# Patient Record
Sex: Male | Born: 1955 | Race: White | Hispanic: No | Marital: Married | Smoking: Never smoker
Health system: Southern US, Community
[De-identification: ages and names within clinical notes are randomized; demographics above are authoritative.]

## PROBLEM LIST (undated history)

## (undated) DIAGNOSIS — I10 Essential (primary) hypertension: Secondary | ICD-10-CM

## (undated) DIAGNOSIS — T7840XA Allergy, unspecified, initial encounter: Secondary | ICD-10-CM

## (undated) DIAGNOSIS — E119 Type 2 diabetes mellitus without complications: Secondary | ICD-10-CM

## (undated) DIAGNOSIS — I639 Cerebral infarction, unspecified: Secondary | ICD-10-CM

## (undated) DIAGNOSIS — E785 Hyperlipidemia, unspecified: Secondary | ICD-10-CM

## (undated) DIAGNOSIS — I469 Cardiac arrest, cause unspecified: Secondary | ICD-10-CM

## (undated) HISTORY — PX: HERNIA REPAIR: SHX51

## (undated) HISTORY — DX: Hyperlipidemia, unspecified: E78.5

## (undated) HISTORY — DX: Allergy, unspecified, initial encounter: T78.40XA

---

## 2003-09-15 ENCOUNTER — Emergency Department (HOSPITAL_COMMUNITY): Admission: AD | Admit: 2003-09-15 | Discharge: 2003-09-15 | Payer: Self-pay | Admitting: Family Medicine

## 2005-01-09 ENCOUNTER — Ambulatory Visit: Payer: Self-pay | Admitting: Internal Medicine

## 2015-07-21 ENCOUNTER — Ambulatory Visit (INDEPENDENT_AMBULATORY_CARE_PROVIDER_SITE_OTHER): Payer: BLUE CROSS/BLUE SHIELD | Admitting: Family Medicine

## 2015-07-21 VITALS — BP 164/95 | HR 100 | Temp 98.2°F | Resp 16 | Ht 71.5 in | Wt 255.6 lb

## 2015-07-21 DIAGNOSIS — E669 Obesity, unspecified: Secondary | ICD-10-CM

## 2015-07-21 DIAGNOSIS — Z131 Encounter for screening for diabetes mellitus: Secondary | ICD-10-CM

## 2015-07-21 DIAGNOSIS — R7302 Impaired glucose tolerance (oral): Secondary | ICD-10-CM | POA: Diagnosis not present

## 2015-07-21 DIAGNOSIS — Z114 Encounter for screening for human immunodeficiency virus [HIV]: Secondary | ICD-10-CM | POA: Diagnosis not present

## 2015-07-21 DIAGNOSIS — Z1159 Encounter for screening for other viral diseases: Secondary | ICD-10-CM | POA: Diagnosis not present

## 2015-07-21 DIAGNOSIS — Z6835 Body mass index (BMI) 35.0-35.9, adult: Secondary | ICD-10-CM

## 2015-07-21 DIAGNOSIS — IMO0001 Reserved for inherently not codable concepts without codable children: Secondary | ICD-10-CM

## 2015-07-21 DIAGNOSIS — Z0000 Encounter for general adult medical examination without abnormal findings: Secondary | ICD-10-CM | POA: Diagnosis not present

## 2015-07-21 DIAGNOSIS — Z13220 Encounter for screening for lipoid disorders: Secondary | ICD-10-CM | POA: Diagnosis not present

## 2015-07-21 DIAGNOSIS — R030 Elevated blood-pressure reading, without diagnosis of hypertension: Secondary | ICD-10-CM | POA: Diagnosis not present

## 2015-07-21 LAB — COMPREHENSIVE METABOLIC PANEL
ALT: 13 U/L (ref 9–46)
AST: 21 U/L (ref 10–35)
Albumin: 4.1 g/dL (ref 3.6–5.1)
Alkaline Phosphatase: 41 U/L (ref 40–115)
BUN: 15 mg/dL (ref 7–25)
CO2: 28 mmol/L (ref 20–31)
Calcium: 8.9 mg/dL (ref 8.6–10.3)
Chloride: 104 mmol/L (ref 98–110)
Creat: 0.87 mg/dL (ref 0.70–1.33)
Glucose, Bld: 136 mg/dL — ABNORMAL HIGH (ref 65–99)
Potassium: 4.2 mmol/L (ref 3.5–5.3)
Sodium: 140 mmol/L (ref 135–146)
Total Bilirubin: 0.9 mg/dL (ref 0.2–1.2)
Total Protein: 6.8 g/dL (ref 6.1–8.1)

## 2015-07-21 LAB — CBC WITH DIFFERENTIAL/PLATELET
Basophils Absolute: 0 10*3/uL (ref 0.0–0.1)
Basophils Relative: 0 % (ref 0–1)
Eosinophils Absolute: 0.1 10*3/uL (ref 0.0–0.7)
Eosinophils Relative: 3 % (ref 0–5)
HCT: 44.1 % (ref 39.0–52.0)
Hemoglobin: 14.8 g/dL (ref 13.0–17.0)
Lymphocytes Relative: 37 % (ref 12–46)
Lymphs Abs: 1.8 10*3/uL (ref 0.7–4.0)
MCH: 30.5 pg (ref 26.0–34.0)
MCHC: 33.6 g/dL (ref 30.0–36.0)
MCV: 90.9 fL (ref 78.0–100.0)
MPV: 10.7 fL (ref 8.6–12.4)
Monocytes Absolute: 0.3 10*3/uL (ref 0.1–1.0)
Monocytes Relative: 7 % (ref 3–12)
Neutro Abs: 2.6 10*3/uL (ref 1.7–7.7)
Neutrophils Relative %: 53 % (ref 43–77)
Platelets: 148 10*3/uL — ABNORMAL LOW (ref 150–400)
RBC: 4.85 MIL/uL (ref 4.22–5.81)
RDW: 13.3 % (ref 11.5–15.5)
WBC: 4.9 10*3/uL (ref 4.0–10.5)

## 2015-07-21 LAB — POC MICROSCOPIC URINALYSIS (UMFC): Mucus: ABSENT

## 2015-07-21 LAB — POCT URINALYSIS DIP (MANUAL ENTRY)
Bilirubin, UA: NEGATIVE
Blood, UA: NEGATIVE
Glucose, UA: NEGATIVE
Ketones, POC UA: NEGATIVE
Leukocytes, UA: NEGATIVE
Nitrite, UA: NEGATIVE
Protein Ur, POC: NEGATIVE
Spec Grav, UA: 1.025
Urobilinogen, UA: 0.2
pH, UA: 5.5

## 2015-07-21 LAB — LIPID PANEL
Cholesterol: 178 mg/dL (ref 125–200)
HDL: 29 mg/dL — ABNORMAL LOW (ref >=40)
LDL Cholesterol: 118 mg/dL (ref <130)
Total CHOL/HDL Ratio: 6.1 Ratio — ABNORMAL HIGH (ref <=5.0)
Triglycerides: 154 mg/dL — ABNORMAL HIGH (ref <150)
VLDL: 31 mg/dL — ABNORMAL HIGH (ref <30)

## 2015-07-21 LAB — TSH: TSH: 1.46 u[IU]/mL (ref 0.350–4.500)

## 2015-07-21 LAB — HIV ANTIBODY (ROUTINE TESTING W REFLEX): HIV 1&2 Ab, 4th Generation: NONREACTIVE

## 2015-07-21 LAB — HEPATITIS C ANTIBODY: HCV Ab: NEGATIVE

## 2015-07-21 LAB — HEMOGLOBIN A1C: Hgb A1c MFr Bld: 6.1 % — AB (ref 4.0–6.0)

## 2015-07-21 LAB — GLUCOSE, POCT (MANUAL RESULT ENTRY): POC Glucose: 135 mg/dl — AB (ref 70–99)

## 2015-07-21 LAB — POCT GLYCOSYLATED HEMOGLOBIN (HGB A1C): Hemoglobin A1C: 6.1

## 2015-07-21 NOTE — Patient Instructions (Addendum)
1.  Check blood pressure once weekly and keep a record of your readings. 2.  PREDIABETES: WEIGHT LOSS, EXERCISE, LOW-SUGAR AND LOW-CARBOHYDRATE FOOD CHOICES. 3.  ELEVATED BLOOD PRESSURE: WEIGHT LOSS, EXERCISE, LOW-SALT FOOD CHOICES.  Keeping you healthy  Get these tests  Blood pressure- Have your blood pressure checked once a year by your healthcare provider.  Normal blood pressure is 120/80  Weight- Have your body mass index (BMI) calculated to screen for obesity.  BMI is a measure of body fat based on height and weight. You can also calculate your own BMI at [email or web address].com/bmi/.  Cholesterol- Have your cholesterol checked every year.  Diabetes- Have your blood sugar checked regularly if you have high blood pressure, high cholesterol, have a family history of diabetes or if you are overweight.  Screening for Colon Cancer- Colonoscopy starting at age 59.  Screening may begin sooner depending on your family history and other health conditions. Follow up colonoscopy as directed by your Gastroenterologist.  Screening for Prostate Cancer- Both blood work (PSA) and a rectal exam help screen for Prostate Cancer.  Screening begins at age 59 with African-American men and at age 59 with Caucasian men.  Screening may begin sooner depending on your family history.  Take these medicines  Aspirin- One aspirin daily can help prevent Heart disease and Stroke.  Flu shot- Every fall.  Tetanus- Every 10 years.  Zostavax- Once after the age of 59 to prevent Shingles.  Pneumonia shot- Once after the age of 59; if you are younger than 6065, ask your healthcare provider if you need a Pneumonia shot.  Take these steps  Don't smoke- If you do smoke, talk to your doctor about quitting.  For tips on how to quit, go to www.smokefree.gov or call 1-800-QUIT-NOW.  Be physically active- Exercise 5 days a week for at least 30 minutes.  If you are not already physically active start slow and gradually work up  to 30 minutes of moderate physical activity.  Examples of moderate activity include walking briskly, mowing the yard, dancing, swimming, bicycling, etc.  Eat a healthy diet- Eat a variety of healthy food such as fruits, vegetables, low fat milk, low fat cheese, yogurt, lean meant, poultry, fish, beans, tofu, etc. For more information go to www.thenutritionsource.org  Drink alcohol in moderation- Limit alcohol intake to less than two drinks a day. Never drink and drive.  Dentist- Brush and floss twice daily; visit your dentist twice a year.  Depression- Your emotional health is as important as your physical health. If you're feeling down, or losing interest in things you would normally enjoy please talk to your healthcare provider.  Eye exam- Visit your eye doctor every year.  Safe sex- If you may be exposed to a sexually transmitted infection, use a condom.  Seat belts- Seat belts can save your life; always wear one.  Smoke/Carbon Monoxide detectors- These detectors need to be installed on the appropriate level of your home.  Replace batteries at least once a year.  Skin cancer- When out in the sun, cover up and use sunscreen 15 SPF or higher.  Violence- If anyone is threatening you, please tell your healthcare provider.  Living Will/ Health care power of attorney- Speak with your healthcare provider and family.

## 2015-07-21 NOTE — Progress Notes (Signed)
Subjective:    Patient ID: Jason Melendez, male    DOB: 12-10-1955, 59 y.o.   MRN: [id number]  07/21/2015  Annual Exam   HPI This 59 y.o. male presents for Complete Physical Examination.  Last physical: nine years ago.  Colonoscopy:  2008; [place].  Arlyce Dice.  No polyps.  Repeat 10 years. TDAP:  Every 2 years with work.   2016. Pneumovax:  never Influenza:  06/01/2015 employment Eye exam:  +glasses; 1.5 years ago.   Dental exam:  Several years.   B: McDonald[employer] Breakfast burrito combo meal, orange juice small. 2 burritos and hashbrown. Lunch: spaghetti, chips, fruit or completes.  Water kiwi strawberry zero calorie. Supper:  Taco salads, breakfast for supper (omelet), diet water, diet Mt. Dew.   Snack: party cakes for halloween; little debbie.   Bedtime snack:  Cashews  Has lost 11 pounds in past week by cutting out nighttime snacks.  First three days was horrible.  Last diet 8 years ago and lost 45 pounds; felt great.     Review of Systems  Constitutional: Negative for fever, chills, diaphoresis, activity change, appetite change, fatigue and unexpected weight change.  HENT: Negative for congestion, dental problem, drooling, ear discharge, ear pain, facial swelling, hearing loss, mouth sores, nosebleeds, postnasal drip, rhinorrhea, sinus pressure, sneezing, sore throat, tinnitus, trouble swallowing and voice change.   Eyes: Negative for photophobia, pain, discharge, redness, itching and visual disturbance.  Respiratory: Negative for apnea, cough, choking, chest tightness, shortness of breath, wheezing and stridor.   Cardiovascular: Negative for chest pain, palpitations and leg swelling.  Gastrointestinal: Negative for nausea, vomiting, abdominal pain, diarrhea, constipation and blood in stool.  Endocrine: Negative for cold intolerance, heat intolerance, polydipsia, polyphagia and polyuria.  Genitourinary: Negative for dysuria, urgency, frequency, hematuria, flank pain,  decreased urine volume, discharge, penile swelling, scrotal swelling, enuresis, difficulty urinating, genital sores, penile pain and testicular pain.       Nocturia x 1.  Urine flow is normal.  No ED; sex drive normal.  Musculoskeletal: Negative for myalgias, back pain, joint swelling, arthralgias, gait problem, neck pain and neck stiffness.  Skin: Negative for color change, pallor, rash and wound.  Allergic/Immunologic: Negative for environmental allergies, food allergies and immunocompromised state.  Neurological: Negative for dizziness, tremors, seizures, syncope, facial asymmetry, speech difficulty, weakness, light-headedness, numbness and headaches.  Hematological: Negative for adenopathy. Does not bruise/bleed easily.  Psychiatric/Behavioral: Negative for suicidal ideas, hallucinations, behavioral problems, confusion, sleep disturbance, self-injury, dysphoric mood, decreased concentration and agitation. The patient is not nervous/anxious and is not hyperactive.     History reviewed. No pertinent past medical history. Past Surgical History  Procedure Laterality Date  . Hernia repair     No Known Allergies Current Outpatient Prescriptions  Medication Sig Dispense Refill  . naproxen sodium (ANAPROX) 220 MG tablet Take 220 mg by mouth 2 (two) times daily with a meal.     No current facility-administered medications for this visit.   Social History   Social History  . Marital Status: Married    Spouse Name: N/A  . Number of Children: N/A  . Years of Education: N/A   Occupational History  . Not on file.   Social History Main Topics  . Smoking status: Never Smoker   . Smokeless tobacco: Not on file  . Alcohol Use: No  . Drug Use: No  . Sexual Activity: Not Currently   Other Topics Concern  . Not on file   Social History Narrative   Marital  status: married      Children: 2 daughters      Lives:  With wife      Employment: printing work      Tobacco: none      Alcohol:  one beer every three months      Drugs:  None      Exercise:  No formal exercise; walks up three flights of stairs several times per day at work.   Family History  Problem Relation Age of Onset  . Diabetes Mother   . Heart disease Father 2260    CABG  . Diabetes Father   . Kidney disease Father   . Kidney failure Father   . Hypertension Sister        Objective:    BP 164/95 mmHg  Pulse 100  Temp(Src) 98.2 F (36.8 C) (Oral)  Resp 16  Ht 5' 11.5" (1.816 m)  Wt 255 lb 9.6 oz (115.939 kg)  BMI 35.16 kg/m2  SpO2 98% Physical Exam  Constitutional: He is oriented to person, place, and time. He appears well-developed and well-nourished. No distress.  obese  HENT:  Head: Normocephalic and atraumatic.  Right Ear: External ear normal.  Left Ear: External ear normal.  Nose: Nose normal.  Mouth/Throat: Oropharynx is clear and moist.  Eyes: Conjunctivae and EOM are normal. Pupils are equal, round, and reactive to light.  Neck: Normal range of motion. Neck supple. Carotid bruit is not present. No thyromegaly present.  Cardiovascular: Normal rate, regular rhythm, normal heart sounds and intact distal pulses.  Exam reveals no gallop and no friction rub.   No murmur heard. Pulmonary/Chest: Effort normal and breath sounds normal. He has no wheezes. He has no rales.  Abdominal: Soft. Bowel sounds are normal. He exhibits no distension and no mass. There is no tenderness. There is no rebound and no guarding. Hernia confirmed negative in the right inguinal area and confirmed negative in the left inguinal area.  Genitourinary: Testes normal and penis normal. Right testis shows no mass, no swelling and no tenderness. Left testis shows no mass, no swelling and no tenderness. Circumcised.  Musculoskeletal:       Right shoulder: Normal.       Left shoulder: Normal.       Cervical back: Normal.  Lymphadenopathy:    He has no cervical adenopathy.  Neurological: He is alert and oriented to person,  place, and time. He has normal reflexes. No cranial nerve deficit. He exhibits normal muscle tone. Coordination normal.  Skin: Skin is warm and dry. No rash noted. He is not diaphoretic.  Dystrophic nails B feet.  Psychiatric: He has a normal mood and affect. His behavior is normal. Judgment and thought content normal.   Results for orders placed or performed in visit on 07/21/15  POCT urinalysis dipstick  Result Value Ref Range   Color, UA yellow yellow   Clarity, UA clear clear   Glucose, UA negative negative   Bilirubin, UA negative negative   Ketones, POC UA negative negative   Spec Grav, UA 1.025    Blood, UA negative negative   pH, UA 5.5    Protein Ur, POC negative negative   Urobilinogen, UA 0.2    Nitrite, UA Negative Negative   Leukocytes, UA Negative Negative  POCT Microscopic Urinalysis ([facility])  Result Value Ref Range   WBC,UR,HPF,POC Few (A) None WBC/hpf   RBC,UR,HPF,POC None None RBC/hpf   Bacteria Few (A) None, Too numerous to count   Mucus Absent  Absent   Epithelial Cells, UR Per Microscopy Few (A) None, Too numerous to count cells/hpf  POCT glucose (manual entry)  Result Value Ref Range   POC Glucose 135 (A) 70 - 99 mg/dl  POCT glycosylated hemoglobin (Hb A1C)  Result Value Ref Range   Hemoglobin A1C 6.1     EKG: WNL.    Assessment & Plan:   1. Routine physical examination   2. Elevated blood pressure   3. Screening, lipid   4. Screening for diabetes mellitus   5. Screening for HIV (human immunodeficiency virus)   6. Need for hepatitis C screening test   7. Obesity   8. BMI 35.0-35.9,adult    1. Complete Physical Examination: anticipatory guidance --- exercise, weight loss, ASA  daily.  Immunizations UTD.   2.  Screening lipid: obtain labs. 3. Screening DMII: obtain labs. 4.  Screening HIV; obtain labs. 5. Screening Hepatitis C: obtain labs. 6. Elevated blood pressure: New.  Obtain labs, EKG. Patient desires three month trial of weight loss,  exercise, low-sodium food choices. 7.Obesity: recommend weight loss, exercise, low-calorie food choices. 8.  Glucose intolerance: New.  Recommend weight loss, exercise, low-carbohydrate food choices; RTC three months.   Orders Placed This Encounter  Procedures  . CBC with Differential/Platelet  . Comprehensive metabolic panel    Order Specific Question:  Has the patient fasted?    Answer:  Yes  . Lipid panel    Order Specific Question:  Has the patient fasted?    Answer:  Yes  . TSH  . Hepatitis C antibody  . HIV antibody  . POCT urinalysis dipstick  . POCT Microscopic Urinalysis ([facility])  . POCT glucose (manual entry)  . POCT glycosylated hemoglobin (Hb A1C)  . EKG 12-Lead   Meds ordered this encounter  Medications  . naproxen sodium (ANAPROX) 220 MG tablet    Sig: Take 220 mg by mouth 2 (two) times daily with a meal.    No Follow-up on file.    Jizel Cheeks Paulita Fujita, M.D. [facility] [address] [place], [place]  [phone] phone [phone] fax

## 2015-07-21 NOTE — Addendum Note (Signed)
Addended by: Honor LohGARRISON, CARRIE M on: 07/21/2015 02:19 PM   Modules accepted: Kipp BroodSmartSet

## 2015-07-24 ENCOUNTER — Encounter: Payer: Self-pay | Admitting: Family Medicine

## 2015-07-31 ENCOUNTER — Encounter: Payer: Self-pay | Admitting: Family Medicine

## 2015-08-08 ENCOUNTER — Telehealth: Payer: Self-pay

## 2015-08-08 NOTE — Telephone Encounter (Signed)
[facility] needs the well-ness report completed on November 20.  See Encounter.  ASAP  Needed this last week.    [phone]

## 2015-08-08 NOTE — Telephone Encounter (Signed)
I was unable to locate a wellness form for this patient. I did not see it scanned into media, Dr. Michaelle CopasSmith's box or any of the scan boxes. Not sure if form was dropped off or completed during office visit.

## 2015-08-13 NOTE — Telephone Encounter (Signed)
Left message on machine for patient. I did not see a copy of wellness form. If he still needs it completed then we can get another copy filled out. If it's already taken care of then disregard voicemail.

## 2015-08-14 NOTE — Telephone Encounter (Signed)
The form has been placed in medical records along with the phone messages attached. Please call patient when this is done.  [phone]

## 2015-08-14 NOTE — Telephone Encounter (Addendum)
Patient returned phone call and was quite ugly with me. He will send over another copy this form and is really hoping that it will be filled out today. Please fax this form to the following numbers: [facility] at [phone] and to the patient at [phone]

## 2015-08-14 NOTE — Telephone Encounter (Signed)
Dr. Katrinka BlazingSmith, can you complete wellness form for this patient? He had a CPE with you on 07/21/15. He is very upset about form not being sent to [facility] but I was unable to locate original form. I have placed a blank copy in your box for completion. I can fax form once completed. Thank you!

## 2015-08-15 NOTE — Telephone Encounter (Signed)
Can another provider complete form? Patient needs ASAP? Will place in 102 nurse station box.

## 2015-08-15 NOTE — Telephone Encounter (Signed)
Form completed this afternoon by Lanier ClamNicole Bush, PA-C. Form has been [facility] to [facility] at [phone] with confirmation and also to patient at [phone], also with confirmation. Left voicemail for patient. Form placed in scan box.

## 2015-08-15 NOTE — Telephone Encounter (Signed)
Patient called back stating he received form that was faxed to him but the form with not be accepted by [facility] because the wait circumference is missing on the form. The form has "n/a". I did not see this documented in the chart but patient says it was measured during his visit on 07/21/15. Will forward form to Dr. Katrinka BlazingSmith for review.

## 2015-08-15 NOTE — Telephone Encounter (Signed)
Patient called again today checking status of form. Jason Melendez is giving him a hard time about his insurance because they don't have the wellness form completed. Form was still in Dr. Michaelle CopasSmith's box. I will see if another provider can complete form based off her office notes from CPE.

## 2015-08-15 NOTE — Telephone Encounter (Signed)
I am happy to complete form today upon arrival to clinic; message not routed to me until 08/14/15?

## 2015-08-16 NOTE — Telephone Encounter (Signed)
The patient is continuing to harass me about this form. Dr. Katrinka BlazingSmith is aware of this. Can clinical PLEASE call this patient and get more information. He will not leave me alone. I am simply the phone operator. I cannot do anything with this form.

## 2015-08-16 NOTE — Telephone Encounter (Signed)
If the form is not correct, he needs to speak with clinical. Medical records does not make changes to patient paperwork. I have placed the form in Dr. Michaelle CopasSmith's box yesterday.

## 2015-08-16 NOTE — Telephone Encounter (Signed)
Spoke with patient --- needs waist circumference for form completion.  Measurement of 44 inches for waist circumference.  Wears a 40 inch waist size.   Form revised and will refax form over with waist circumference included.

## 2015-08-16 NOTE — Telephone Encounter (Signed)
Please call this patient. He states that this form is still incorrect.

## 2015-08-20 ENCOUNTER — Telehealth: Payer: Self-pay | Admitting: Family Medicine

## 2015-08-20 NOTE — Telephone Encounter (Signed)
Not sure if form is in scan pile. I haven't had a chance to look yet.

## 2015-08-20 NOTE — Telephone Encounter (Signed)
Patient wants a copy of his wellness form faxed to him at [phone]. Please see previous phone exchanges about this form.

## 2015-08-20 NOTE — Telephone Encounter (Signed)
Is this form in the scan pile?

## 2015-08-21 NOTE — Telephone Encounter (Signed)
Still looking for form. Did not see it in scan box today.

## 2015-08-23 NOTE — Telephone Encounter (Signed)
Still unable to locate form. Medical records has sent a lot of documents to the scan center this week. Form may have been sent for scanning. Nothing yet scanned into media as of today.

## 2015-10-25 ENCOUNTER — Ambulatory Visit: Payer: BLUE CROSS/BLUE SHIELD | Admitting: Family Medicine

## 2015-10-29 ENCOUNTER — Ambulatory Visit (INDEPENDENT_AMBULATORY_CARE_PROVIDER_SITE_OTHER): Payer: 59 | Admitting: Family Medicine

## 2015-10-29 ENCOUNTER — Encounter: Payer: Self-pay | Admitting: Family Medicine

## 2015-10-29 VITALS — BP 128/77 | HR 96 | Temp 99.2°F | Resp 16 | Ht 71.5 in | Wt 242.0 lb

## 2015-10-29 DIAGNOSIS — Z6833 Body mass index (BMI) 33.0-33.9, adult: Secondary | ICD-10-CM

## 2015-10-29 DIAGNOSIS — D696 Thrombocytopenia, unspecified: Secondary | ICD-10-CM

## 2015-10-29 DIAGNOSIS — R7302 Impaired glucose tolerance (oral): Secondary | ICD-10-CM | POA: Diagnosis not present

## 2015-10-29 DIAGNOSIS — R030 Elevated blood-pressure reading, without diagnosis of hypertension: Secondary | ICD-10-CM | POA: Diagnosis not present

## 2015-10-29 DIAGNOSIS — E7800 Pure hypercholesterolemia, unspecified: Secondary | ICD-10-CM

## 2015-10-29 DIAGNOSIS — IMO0001 Reserved for inherently not codable concepts without codable children: Secondary | ICD-10-CM

## 2015-10-29 DIAGNOSIS — E669 Obesity, unspecified: Secondary | ICD-10-CM | POA: Diagnosis not present

## 2015-10-29 LAB — POCT GLYCOSYLATED HEMOGLOBIN (HGB A1C): Hemoglobin A1C: 6.3

## 2015-10-29 NOTE — Progress Notes (Signed)
Subjective:    Patient ID: Jason Melendez, male    DOB: 02/21/1956, 60 y.o.   MRN: [id number]  10/29/2015  Follow-up   HPI This 60 y.o. male presents for three month follow-up:   1. HTN:  Down 13 pounds in past three months.  BP running depends on the time of the day.  Early morning, blood pressures are better; later in the day, blood pressure worsens.  Heart rate follows BP.  Joined [employer]; hit hard for two weeks and then daughter noticed that patient was going to the gym.  BP was high; [employer] dictates what patient does; there were 29 straight days with 17 hours per day.  Good money and overtime paid for Christmas.  Had just started in 05/2015.  March 11 is pre-market and the fourth week of May market will slow down again. Hard to eat right.    2. Glucose Intolerance:  Weight down 13 pounds.  3.  Hyperlipidemia:  Elevated; LDL 118.    Weight down 21 pounds.  Daughter with eating disorder; daughter does all the cooking.  Unable to discuss need for weight loss with daughter; also unable to restrict with daughter; daughter has been admitted for five weeks.  [facility]; over-achiever.  Collapsed while writing thesis.  Dance team member made comment.    Review of Systems  Constitutional: Negative for fever, chills, diaphoresis, activity change, appetite change and fatigue.  Respiratory: Negative for cough and shortness of breath.   Cardiovascular: Negative for chest pain, palpitations and leg swelling.  Gastrointestinal: Negative for nausea, vomiting, abdominal pain and diarrhea.  Endocrine: Negative for cold intolerance, heat intolerance, polydipsia, polyphagia and polyuria.  Skin: Negative for color change, rash and wound.  Neurological: Negative for dizziness, tremors, seizures, syncope, facial asymmetry, speech difficulty, weakness, light-headedness, numbness and headaches.  Psychiatric/Behavioral: Negative for sleep disturbance and dysphoric mood. The  patient is not nervous/anxious.     No past medical history on file. Past Surgical History  Procedure Laterality Date  . Hernia repair     No Known Allergies Current Outpatient Prescriptions  Medication Sig Dispense Refill  . aspirin 81 MG tablet Take 81 mg by mouth daily.    . naproxen sodium (ANAPROX) 220 MG tablet Take 220 mg by mouth 2 (two) times daily with a meal.    . OVER THE COUNTER MEDICATION     . atorvastatin (LIPITOR) 10 MG tablet Take 1 tablet (10 mg total) by mouth daily at 6 PM. 90 tablet 1   No current facility-administered medications for this visit.   Social History   Social History  . Marital Status: Married    Spouse Name: N/A  . Number of Children: N/A  . Years of Education: N/A   Occupational History  . Not on file.   Social History Main Topics  . Smoking status: Never Smoker   . Smokeless tobacco: Not on file  . Alcohol Use: No  . Drug Use: No  . Sexual Activity: Not Currently   Other Topics Concern  . Not on file   Social History Narrative   Marital status: married      Children: 2 daughters      Lives:  With wife      Employment: printing work      Tobacco: none      Alcohol: one beer every three months      Drugs:  None      Exercise:  No formal exercise;  walks up three flights of stairs several times per day at work.   Family History  Problem Relation Age of Onset  . Diabetes Mother   . Heart disease Father 81    CABG  . Diabetes Father   . Kidney disease Father   . Kidney failure Father   . Hypertension Sister        Objective:    BP 128/77 mmHg  Pulse 96  Temp(Src) 99.2 F (37.3 C) (Oral)  Resp 16  Ht 5' 11.5" (1.816 m)  Wt 242 lb (109.77 kg)  BMI 33.29 kg/m2  SpO2 98% Physical Exam  Constitutional: He is oriented to person, place, and time. He appears well-developed and well-nourished. No distress.  HENT:  Head: Normocephalic and atraumatic.  Right Ear: External ear normal.  Left Ear: External ear normal.    Nose: Nose normal.  Mouth/Throat: Oropharynx is clear and moist.  Eyes: Conjunctivae and EOM are normal. Pupils are equal, round, and reactive to light.  Neck: Normal range of motion. Neck supple. Carotid bruit is not present. No thyromegaly present.  Cardiovascular: Normal rate, regular rhythm, normal heart sounds and intact distal pulses.  Exam reveals no gallop and no friction rub.   No murmur heard. Pulmonary/Chest: Effort normal and breath sounds normal. He has no wheezes. He has no rales.  Abdominal: Soft. Bowel sounds are normal. He exhibits no distension and no mass. There is no tenderness. There is no rebound and no guarding.  Lymphadenopathy:    He has no cervical adenopathy.  Neurological: He is alert and oriented to person, place, and time. No cranial nerve deficit.  Skin: Skin is warm and dry. No rash noted. He is not diaphoretic.  Psychiatric: He has a normal mood and affect. His behavior is normal.  Nursing note and vitals reviewed.       Assessment & Plan:   1. Blood pressure elevated   2. Glucose intolerance (impaired glucose tolerance)   3. Pure hypercholesterolemia   4. Thrombocytopenia (HCC)   5. Obesity   6. BMI 33.0-33.9,adult     Orders Placed This Encounter  Procedures  . CBC with Differential/Platelet  . Comprehensive metabolic panel    Order Specific Question:  Has the patient fasted?    Answer:  Yes  . Lipid panel    Order Specific Question:  Has the patient fasted?    Answer:  Yes  . POCT glycosylated hemoglobin (Hb A1C)   Meds ordered this encounter  Medications  . aspirin 81 MG tablet    Sig: Take 81 mg by mouth daily.  Marland Kitchen OVER THE COUNTER MEDICATION    Sig:   . atorvastatin (LIPITOR) 10 MG tablet    Sig: Take 1 tablet (10 mg total) by mouth daily at 6 PM.    Dispense:  90 tablet    Refill:  1    Return in about 9 months (around 07/28/2016) for complete physical examiniation.    Welford Christmas Paulita Fujita, M.D. [facility] [address] [place], [place]  [phone] phone [phone] fax

## 2015-10-30 LAB — COMPREHENSIVE METABOLIC PANEL
ALT: 8 U/L — ABNORMAL LOW (ref 9–46)
AST: 15 U/L (ref 10–35)
Albumin: 4.1 g/dL (ref 3.6–5.1)
Alkaline Phosphatase: 44 U/L (ref 40–115)
BUN: 11 mg/dL (ref 7–25)
CO2: 28 mmol/L (ref 20–31)
Calcium: 9.5 mg/dL (ref 8.6–10.3)
Chloride: 107 mmol/L (ref 98–110)
Creat: 0.83 mg/dL (ref 0.70–1.33)
Glucose, Bld: 84 mg/dL (ref 65–99)
Potassium: 4.8 mmol/L (ref 3.5–5.3)
Sodium: 144 mmol/L (ref 135–146)
Total Bilirubin: 0.6 mg/dL (ref 0.2–1.2)
Total Protein: 6.9 g/dL (ref 6.1–8.1)

## 2015-10-30 LAB — CBC WITH DIFFERENTIAL/PLATELET
Basophils Absolute: 0 10*3/uL (ref 0.0–0.1)
Basophils Relative: 0 % (ref 0–1)
Eosinophils Absolute: 0.4 10*3/uL (ref 0.0–0.7)
Eosinophils Relative: 5 % (ref 0–5)
HCT: 42.1 % (ref 39.0–52.0)
Hemoglobin: 14 g/dL (ref 13.0–17.0)
Lymphocytes Relative: 41 % (ref 12–46)
Lymphs Abs: 2.9 10*3/uL (ref 0.7–4.0)
MCH: 29.4 pg (ref 26.0–34.0)
MCHC: 33.3 g/dL (ref 30.0–36.0)
MCV: 88.4 fL (ref 78.0–100.0)
MPV: 10.9 fL (ref 8.6–12.4)
Monocytes Absolute: 0.6 10*3/uL (ref 0.1–1.0)
Monocytes Relative: 8 % (ref 3–12)
Neutro Abs: 3.2 10*3/uL (ref 1.7–7.7)
Neutrophils Relative %: 46 % (ref 43–77)
Platelets: 226 10*3/uL (ref 150–400)
RBC: 4.76 MIL/uL (ref 4.22–5.81)
RDW: 13.1 % (ref 11.5–15.5)
WBC: 7 10*3/uL (ref 4.0–10.5)

## 2015-10-30 LAB — LIPID PANEL
Cholesterol: 172 mg/dL (ref 125–200)
HDL: 24 mg/dL — ABNORMAL LOW (ref >=40)
LDL Cholesterol: 98 mg/dL (ref <130)
Total CHOL/HDL Ratio: 7.2 Ratio — ABNORMAL HIGH (ref <=5.0)
Triglycerides: 251 mg/dL — ABNORMAL HIGH (ref <150)
VLDL: 50 mg/dL — ABNORMAL HIGH (ref <30)

## 2015-11-14 MED ORDER — ATORVASTATIN CALCIUM 10 MG PO TABS: 10.0000 mg | ORAL_TABLET | Freq: Every day | ORAL | Status: DC

## 2016-10-12 ENCOUNTER — Encounter (HOSPITAL_COMMUNITY): Payer: Self-pay | Admitting: Emergency Medicine

## 2016-10-12 ENCOUNTER — Inpatient Hospital Stay (HOSPITAL_COMMUNITY)
Admission: EM | Admit: 2016-10-12 | Discharge: 2016-10-18 | DRG: 038 | Disposition: A | Discharge status: Home / Self Care | Payer: 59 | Attending: Internal Medicine | Admitting: Internal Medicine

## 2016-10-12 ENCOUNTER — Ambulatory Visit (INDEPENDENT_AMBULATORY_CARE_PROVIDER_SITE_OTHER): Payer: 59 | Admitting: Family Medicine

## 2016-10-12 ENCOUNTER — Emergency Department (HOSPITAL_COMMUNITY): Payer: 59

## 2016-10-12 VITALS — BP 219/138 | HR 106 | Resp 18 | Wt 270.0 lb

## 2016-10-12 DIAGNOSIS — E7800 Pure hypercholesterolemia, unspecified: Secondary | ICD-10-CM | POA: Diagnosis present

## 2016-10-12 DIAGNOSIS — I509 Heart failure, unspecified: Secondary | ICD-10-CM

## 2016-10-12 DIAGNOSIS — I5022 Chronic systolic (congestive) heart failure: Secondary | ICD-10-CM | POA: Diagnosis present

## 2016-10-12 DIAGNOSIS — Z6836 Body mass index (BMI) 36.0-36.9, adult: Secondary | ICD-10-CM

## 2016-10-12 DIAGNOSIS — I493 Ventricular premature depolarization: Secondary | ICD-10-CM | POA: Diagnosis not present

## 2016-10-12 DIAGNOSIS — E66811 Obesity, class 1: Secondary | ICD-10-CM

## 2016-10-12 DIAGNOSIS — R4701 Aphasia: Secondary | ICD-10-CM | POA: Diagnosis present

## 2016-10-12 DIAGNOSIS — I63522 Cerebral infarction due to unspecified occlusion or stenosis of left anterior cerebral artery: Secondary | ICD-10-CM | POA: Diagnosis not present

## 2016-10-12 DIAGNOSIS — I429 Cardiomyopathy, unspecified: Secondary | ICD-10-CM

## 2016-10-12 DIAGNOSIS — R4789 Other speech disturbances: Secondary | ICD-10-CM | POA: Diagnosis not present

## 2016-10-12 DIAGNOSIS — G8191 Hemiplegia, unspecified affecting right dominant side: Secondary | ICD-10-CM | POA: Diagnosis present

## 2016-10-12 DIAGNOSIS — I110 Hypertensive heart disease with heart failure: Secondary | ICD-10-CM | POA: Diagnosis present

## 2016-10-12 DIAGNOSIS — I6523 Occlusion and stenosis of bilateral carotid arteries: Secondary | ICD-10-CM | POA: Diagnosis present

## 2016-10-12 DIAGNOSIS — I161 Hypertensive emergency: Secondary | ICD-10-CM | POA: Diagnosis not present

## 2016-10-12 DIAGNOSIS — R531 Weakness: Secondary | ICD-10-CM | POA: Diagnosis not present

## 2016-10-12 DIAGNOSIS — R29898 Other symptoms and signs involving the musculoskeletal system: Secondary | ICD-10-CM | POA: Diagnosis not present

## 2016-10-12 DIAGNOSIS — E785 Hyperlipidemia, unspecified: Secondary | ICD-10-CM | POA: Diagnosis present

## 2016-10-12 DIAGNOSIS — E669 Obesity, unspecified: Secondary | ICD-10-CM

## 2016-10-12 DIAGNOSIS — I639 Cerebral infarction, unspecified: Secondary | ICD-10-CM

## 2016-10-12 DIAGNOSIS — I6522 Occlusion and stenosis of left carotid artery: Secondary | ICD-10-CM

## 2016-10-12 DIAGNOSIS — I420 Dilated cardiomyopathy: Secondary | ICD-10-CM

## 2016-10-12 DIAGNOSIS — Z79899 Other long term (current) drug therapy: Secondary | ICD-10-CM

## 2016-10-12 DIAGNOSIS — Z8673 Personal history of transient ischemic attack (TIA), and cerebral infarction without residual deficits: Secondary | ICD-10-CM | POA: Diagnosis present

## 2016-10-12 HISTORY — DX: Essential (primary) hypertension: I10

## 2016-10-12 LAB — CBC WITH DIFFERENTIAL/PLATELET
Basophils Absolute: 0 10*3/uL (ref 0.0–0.1)
Basophils Relative: 0 %
Eosinophils Absolute: 0.1 10*3/uL (ref 0.0–0.7)
Eosinophils Relative: 2 %
HCT: 44.5 % (ref 39.0–52.0)
Hemoglobin: 14.8 g/dL (ref 13.0–17.0)
Lymphocytes Relative: 37 %
Lymphs Abs: 2.4 10*3/uL (ref 0.7–4.0)
MCH: 30.3 pg (ref 26.0–34.0)
MCHC: 33.3 g/dL (ref 30.0–36.0)
MCV: 91.2 fL (ref 78.0–100.0)
Monocytes Absolute: 0.4 10*3/uL (ref 0.1–1.0)
Monocytes Relative: 6 %
Neutro Abs: 3.4 10*3/uL (ref 1.7–7.7)
Neutrophils Relative %: 55 %
Platelets: 145 10*3/uL — ABNORMAL LOW (ref 150–400)
RBC: 4.88 MIL/uL (ref 4.22–5.81)
RDW: 13.1 % (ref 11.5–15.5)
WBC: 6.4 10*3/uL (ref 4.0–10.5)

## 2016-10-12 LAB — BASIC METABOLIC PANEL
Anion gap: 10 (ref 5–15)
BUN: 12 mg/dL (ref 6–20)
CO2: 25 mmol/L (ref 22–32)
Calcium: 9.5 mg/dL (ref 8.9–10.3)
Chloride: 106 mmol/L (ref 101–111)
Creatinine, Ser: 1.01 mg/dL (ref 0.61–1.24)
GFR calc Af Amer: >60 mL/min (ref >60)
GFR calc non Af Amer: >60 mL/min (ref >60)
Glucose, Bld: 117 mg/dL — ABNORMAL HIGH (ref 65–99)
Potassium: 3.9 mmol/L (ref 3.5–5.1)
Sodium: 141 mmol/L (ref 135–145)

## 2016-10-12 LAB — HEPATIC FUNCTION PANEL
ALT: 18 U/L (ref 17–63)
AST: 34 U/L (ref 15–41)
Albumin: 3.5 g/dL (ref 3.5–5.0)
Alkaline Phosphatase: 34 U/L — ABNORMAL LOW (ref 38–126)
Bilirubin, Direct: 0.2 mg/dL (ref 0.1–0.5)
Indirect Bilirubin: 0.7 mg/dL (ref 0.3–0.9)
Total Bilirubin: 0.9 mg/dL (ref 0.3–1.2)
Total Protein: 6 g/dL — ABNORMAL LOW (ref 6.5–8.1)

## 2016-10-12 LAB — BRAIN NATRIURETIC PEPTIDE: B Natriuretic Peptide: 27 pg/mL (ref 0.0–100.0)

## 2016-10-12 LAB — I-STAT TROPONIN, ED: Troponin i, poc: 0.02 ng/mL (ref 0.00–0.08)

## 2016-10-12 LAB — APTT: aPTT: 32 seconds (ref 24–36)

## 2016-10-12 LAB — PROTIME-INR
INR: 1.05
Prothrombin Time: 13.7 seconds (ref 11.4–15.2)

## 2016-10-12 LAB — D-DIMER, QUANTITATIVE: D-Dimer, Quant: 0.89 ug/mL-FEU — ABNORMAL HIGH (ref 0.00–0.50)

## 2016-10-12 MED ORDER — SODIUM CHLORIDE 0.9 % IV BOLUS (SEPSIS)
1000.0000 mL | Freq: Once | INTRAVENOUS | Status: AC
  Administered 2016-10-12: 1000 mL via INTRAVENOUS

## 2016-10-12 MED ORDER — IOPAMIDOL (ISOVUE-370) INJECTION 76%
INTRAVENOUS | Status: AC
  Filled 2016-10-12: qty 100

## 2016-10-12 MED ORDER — ATROPINE SULFATE 1 MG/10ML IJ SOSY
1.0000 mg | PREFILLED_SYRINGE | Freq: Once | INTRAMUSCULAR | Status: AC
Start: 2016-10-12 — End: 2016-10-12
  Administered 2016-10-12: 1 mg via INTRAVENOUS

## 2016-10-12 MED ORDER — HYDRALAZINE HCL 20 MG/ML IJ SOLN
5.0000 mg | Freq: Once | INTRAMUSCULAR | Status: AC
  Administered 2016-10-12: 5 mg via INTRAVENOUS
  Filled 2016-10-12: qty 1

## 2016-10-12 MED ORDER — SODIUM CHLORIDE 0.9 % IV SOLN
INTRAVENOUS | Status: DC
  Administered 2016-10-12: 21:00:00 via INTRAVENOUS

## 2016-10-12 NOTE — ED Notes (Signed)
pts weakness and aphasia has subsided, edp and this rn remains at bedside

## 2016-10-12 NOTE — ED Notes (Signed)
Neurologist and rapid response now at bedside

## 2016-10-12 NOTE — ED Notes (Signed)
Pt denies H/A, lightheadedness, dizziness or blurred vision

## 2016-10-12 NOTE — ED Provider Notes (Signed)
Medical screening examination/treatment/procedure(s) were conducted as a shared visit with non-physician practitioner(s) and myself.  I personally evaluated the patient during the encounter.   EKG Interpretation  Date/Time:  Monday October 12 2016 20:49:34 EST Ventricular Rate:  92 PR Interval:    QRS Duration: 92 QT Interval:  349 QTC Calculation: 432 R Axis:   45 Text Interpretation:  Sinus rhythm Probable left atrial enlargement Borderline T wave abnormalities Confirmed by Freida BusmanALLEN  MD, Riggin Cuttino ([id number]) on 10/12/2016 10:24:1137 PM     61 year old male who came here complaining of right sided weakness since 2 days. Patient also hypertensive. He also had some right-sided chest pain with elevated d-dimer. He was to have a CT of his chest as well as a brain MRI after his chest x-ray and head CT did not show any acute findings. Patient was given 5 mg of hydralazine for his hypertension and became bradycardic as well as hypotensive. Given IV fluids as well as atropine due to his heart rate going down into the 30s. Patient then developed confusion and inability to lift up his right arm and right leg. As his blood pressure improved and became normotensive as well as his heart rate after the atropine he then regained the use of his right arm but still cannot raise his right leg. Code stroke had been activated and I spoke with Dr. Amada JupiterKirkpatrick will come and see the patient.   Lorre NickAnthony Link Burgeson, MD 10/12/16 [phone]

## 2016-10-12 NOTE — ED Provider Notes (Signed)
[facility]-EMERGENCY DEPT Provider Note   CSN: [id number] Arrival date & time: 10/12/16  1902    History   Chief Complaint No chief complaint on file.   HPI Pacer Jason Melendez is a 61 y.o. male.  61 year old male presents to the emergency department from urgent care for further evaluation of right-sided weakness. Patient states that he has had symptoms intermittently over the past 3 days. He reports noticing weakness in his right upper and right lower extremities. He also had problems with fine motor in his right hand today when he was trying to pinch the end of his sock to put it on his foot. He also reports moments of sporadic aphasia characterized as "knowing what I want to say, but not being able to get the words out". He states that he went to urgent care for further evaluation of this. His blood pressure was found to be 212/114. He has noticed some intermittent swelling in his RLE. He states that he has also noted a pain in his right chest that goes through to his back. This gets worse with going from a seated to standing position. He does not directly attribute these symptoms to being associated with his RUE/RLE weakness. Patient has not had any headache, syncope or near-syncope, dizziness or lightheadedness, fever, nausea, vomiting, extremity sensation changes, or shortness of breath. He states that he has had hypertension in the past, but was able to get this under control with lifestyle changes and diet. He is currently on medication for dyslipidemia. He has a strong family history of stroke and cardiovascular disease as well as diabetes. He has had a 40 pound weight gain in the past year which she attributes to poor eating habits and lack of exercise. He has been seeking intermittent medical care at [facility] urgent care. He denies being followed by a primary care doctor. No personal hx of heart attack or stroke.   The history is provided by the patient and a relative. No language interpreter  was used.    History reviewed. No pertinent past medical history.  There are no active problems to display for this patient.   Past Surgical History:  Procedure Laterality Date  . HERNIA REPAIR         Home Medications    Prior to Admission medications   Medication Sig Start Date End Date Taking? Authorizing Provider  aspirin 81 MG tablet Take 81 mg by mouth daily.   Yes Historical Provider, MD  atorvastatin (LIPITOR) 10 MG tablet Take 1 tablet (10 mg total) by mouth daily at 6 PM. 11/14/15  Yes Ethelda Chick, MD  naproxen sodium (ANAPROX) 220 MG tablet Take 220 mg by mouth 2 (two) times daily with a meal.   Yes Historical Provider, MD    Family History Family History  Problem Relation Age of Onset  . Diabetes Mother   . Heart disease Father 73    CABG  . Diabetes Father   . Kidney disease Father   . Kidney failure Father   . Hypertension Sister     Social History Social History  Substance Use Topics  . Smoking status: Never Smoker  . Smokeless tobacco: Never Used  . Alcohol use No     Allergies   Patient has no known allergies.   Review of Systems Review of Systems Ten systems reviewed and are negative for acute change, except as noted in the HPI.    Physical Exam Updated Vital Signs BP 175/90   Pulse 99  Resp 13   Ht 6' (1.829 m)   Wt 120.2 kg   SpO2 99%   BMI 35.94 kg/m   Physical Exam  Constitutional: He is oriented to person, place, and time. He appears well-developed and well-nourished. No distress.  Nontoxic and in NAD  HENT:  Head: Normocephalic and atraumatic.  Mouth/Throat: Oropharynx is clear and moist.  Symmetric rise of the uvula with phonation. No hemotympanum bilaterally.  Eyes: Conjunctivae and EOM are normal. Pupils are equal, round, and reactive to light. No scleral icterus.  Neck: Normal range of motion.  No nuchal rigidity or meningismus  Cardiovascular: Regular rhythm and intact distal pulses.   Borderline tachycardia.   Pulmonary/Chest: Effort normal. No respiratory distress. He has no wheezes.  Respirations even and unlabored.  Musculoskeletal: Normal range of motion.  Neurological: He is alert and oriented to person, place, and time. No cranial nerve deficit. He exhibits normal muscle tone. Coordination normal.  GCS 15. Speech is goal oriented. No cranial nerve deficits appreciated; symmetric eyebrow raise, no facial drooping, tongue midline. Patient has equal grip strength bilaterally with 5/5 strength against resistance in all major muscle groups bilaterally. Sensation to light touch intact. Patient moves extremities without ataxia. No pronator drift. Normal heel-to-shin.  Skin: Skin is warm and dry. No rash noted. He is not diaphoretic. No erythema. No pallor.  Psychiatric: He has a normal mood and affect. His behavior is normal.  Nursing note and vitals reviewed.    ED Treatments / Results  Labs (all labs ordered are listed, but only abnormal results are displayed) Labs Reviewed  CBC WITH DIFFERENTIAL/PLATELET - Abnormal; Notable for the following:       Result Value   Platelets 145 (*)    All other components within normal limits  BASIC METABOLIC PANEL - Abnormal; Notable for the following:    Glucose, Bld 117 (*)    All other components within normal limits  D-DIMER, QUANTITATIVE (NOT AT [facility]) - Abnormal; Notable for the following:    D-Dimer, Quant 0.89 (*)    All other components within normal limits  HEPATIC FUNCTION PANEL - Abnormal; Notable for the following:    Total Protein 6.0 (*)    Alkaline Phosphatase 34 (*)    All other components within normal limits  BRAIN NATRIURETIC PEPTIDE  PROTIME-INR  APTT  URINALYSIS, ROUTINE W REFLEX MICROSCOPIC  RAPID URINE DRUG SCREEN, HOSP PERFORMED  I-STAT TROPOININ, ED    EKG  EKG Interpretation  Date/Time:  Monday October 12 2016 20:49:34 EST Ventricular Rate:  92 PR Interval:    QRS Duration: 92 QT Interval:  349 QTC  Calculation: 432 R Axis:   45 Text Interpretation:  Sinus rhythm Probable left atrial enlargement Borderline T wave abnormalities Confirmed by Freida Busman  MD, ANTHONY ([id number]) on 10/12/2016 10:24:37 PM       Radiology Dg Chest 2 View  Result Date: 10/12/2016 CLINICAL DATA:  Initial evaluation for acute right-sided chest pain. EXAM: CHEST  2 VIEW COMPARISON:  None. FINDINGS: The cardiac and mediastinal silhouettes are within normal limits. The lungs are normally inflated. No airspace consolidation, pleural effusion, or pulmonary edema is identified. There is no pneumothorax. No acute osseous abnormality identified. IMPRESSION: No active cardiopulmonary disease. Electronically Signed   By: Rise Mu M.D.   On: 10/12/2016 21:33   Ct Head Wo Contrast  Result Date: 10/12/2016 CLINICAL DATA:  Right leg and arm weakness.  Hypertension. EXAM: CT HEAD WITHOUT CONTRAST TECHNIQUE: Contiguous axial images were obtained from the  base of the skull through the vertex without intravenous contrast. COMPARISON:  None. FINDINGS: Brain: No mass lesion, intraparenchymal hemorrhage or extra-axial collection. No evidence of acute cortical infarct. Brain parenchyma and CSF-containing spaces are normal for age. Left parafalcine arachnoid cyst. Vascular: No hyperdense vessel or unexpected calcification. Skull: Normal visualized skull base, calvarium and extracranial soft tissues. Sinuses/Orbits: No sinus fluid levels or advanced mucosal thickening. No mastoid effusion. Normal orbits. IMPRESSION: No acute intracranial abnormality. Electronically Signed   By: Deatra Robinson M.D.   On: 10/12/2016 21:12    Procedures Procedures (including critical care time)  Medications Ordered in ED Medications  0.9 %  sodium chloride infusion ( Intravenous New Bag/Given 10/12/16 2111)  hydrALAZINE (APRESOLINE) injection 5 mg (5 mg Intravenous Given 10/12/16 2235)  atropine 1 MG/10ML injection 1 mg (1 mg Intravenous Given 10/12/16 2321)   sodium chloride 0.9 % bolus 1,000 mL (1,000 mLs Intravenous Bolus from Bag 10/12/16 2321)    CRITICAL CARE Performed by: Antony Madura   Total critical care time: 60 minutes  Critical care time was exclusive of separately billable procedures and treating other patients.  Critical care was necessary to treat or prevent imminent or life-threatening deterioration.  Critical care was time spent personally by me on the following activities: development of treatment plan with patient and/or surrogate as well as nursing, discussions with consultants, evaluation of patient's response to treatment, examination of patient, obtaining history from patient or surrogate, ordering and performing treatments and interventions, ordering and review of laboratory studies, ordering and review of radiographic studies, pulse oximetry and re-evaluation of patient's condition.   Initial Impression / Assessment and Plan / ED Course  I have reviewed the triage vital signs and the nursing notes.  Pertinent labs & imaging results that were available during my care of the patient were reviewed by me and considered in my medical decision making (see chart for details).    10:25 PM Patient case discussed with my attending, Dr. Freida Busman, including initial negative imaging and reassuring work up thus far. Dr. Freida Busman recommends giving 5mg  Hydralazine for BP management given reassuring head CT. Plan to obtain CTA chest to evaluate cause of elevated D dimer. MRI brain also ordered.  10:35 PM Patient received 5mg  Hydralazine for BP. Tolerated well. Plan discussed with patient which includes CTA and MRI brain.   11:25 PM Call to patient's room by RN at ~2300 for bradycardia into the 30s. Per nurse at bedside, patient was having in IV placed in his left [facility] when he started to complain of feeling clammy and lightheaded. He expressed to the nurse that he felt as though he was going to pass out. Patient never fully lost consciousness,  but did become bradycardic down to 27 bpm. Blood pressure dropped to 83/57. At this time, patient was noted to have significant worsening of right upper extremity and right lower extremity weakness. Strength rated at 0/5. Patient also appeared mildly aphasic. He was given 1 mg of atropine for persistent bradycardia which improved her heart rate into the 90s. A code stroke was called at this time.  After prolonged observation, patient began to regain some strength in his right upper extremity and right lower extremity. Currently, his strength in his right upper extremity is rated at 3-4/5 with 2/5 strength in his right lower extremity. Patient still with moments of a stage, but overall has clear speech. I suspect his worsening symptoms to be secondary to poor brain perfusion associated with near vasovagal syncope, making more  prominent the deficits that have been present over the past 3 days; likely from CVA. I do not believe the patient's symptoms to be directly related to the hydralazine, especially in light of the dosing and delayed onset of hypotension compared to when this medication was given. I had a long discussion with the family after the patient was stabilized. They verbalized understanding of his need for admission. MRI/MRA pending.  12:18 AM Vitals remain stable. Imaging pending. [facility] after imaging results. Dr. Amada JupiterKirkpatrick and inpatient neurology to follow and continue with consultation. Patient is not a TPA candidate as LSN was 72 hours ago.  1:04 AM Call received from Dr. Amada JupiterKirkpatrick who has reviewed the patient's CT scan. He believes that the area that the radiologist interpreted as a left parafalcine arachnoid cyst is actually representative of a subacute stroke. MRI is currently pending to confirm this. Case discussed with Dr. Katrinka BlazingSmith of hospitalist service who will admit.   Final Clinical Impressions(s) / ED Diagnoses   Final diagnoses:  Cerebrovascular accident (CVA),  unspecified mechanism [facility](HCC)    New Prescriptions New Prescriptions   No medications on file     Antony MaduraKelly Darby Shadwick, PA-C 10/13/16 0107    Antony MaduraKelly Justino Boze, PA-C 10/13/16 0455    Lorre NickAnthony Allen, MD 10/15/16 1452

## 2016-10-12 NOTE — Progress Notes (Signed)
By signing my name below, I, [name], attest that this documentation has been prepared under the direction and in the presence of [name], MD.  Electronically Signed: Arvilla Market, Medical Scribe. 10/12/16. 5:47 PM.  Subjective:    Patient ID: Jason Melendez, male    DOB: Sep 21, 1955, 61 y.o.   MRN: [id number]  HPI  Chief Complaint  Patient presents with  . Extremity Weakness    right arm and leg x 3 days    HPI Comments: Jason Melendez is a 61 y.o. male with a PMHx of HLD who presents to the [facility] complaining of left arm and leg weakness onset 2 days ago. Last seen 1 year ago by Dr. Katrinka Blazing. Pt is an Development worker, international aid and around lunch time on onset he was "feeling funny" - he suspects he stepped in holes while working in the field since he almost tripped. When he got home he noticed his right leg and right arm felt weak. Yesterday he felt fatigue/sluggish and he still feels fatigue stating "I'm not at 100%". Yesterday pt had difficulty gripping his cup and it kept slipping out his hand. Last night his bp ranged around 160s/90s. This morning around 7:15 am he couldn't pinch/grip his sock as much as he could his left arm. Pt has been having trouble finding the words to find onset 8:45 am today when explaining a project. He notes feeling worse when he stands up, but mentions he sits in front of the computer all day for work -  will get light-headed if he stands too fast. Pt noticed leg edema 3 weeks ago and he currently feels sweaty. Pt was never on bp medication in the past. Pt takes baby ASA 81 mg daily for his knees and 2 naproxen every morning. Denies recent increase in alcohol over the past month - drinks 1 beer socially a month. Denies taking OTC drugs, viagra, or any illicit drugs/cocaine/marijuana. Denis chest pain, chest tightness, trouble breathing, abdominal pain, dizziness, and light-headedness.  Reports back pain for the past 3  weeks.  There are no active problems to display for this patient.  No past medical history on file. Past Surgical History:  Procedure Laterality Date  . HERNIA REPAIR     No Known Allergies Prior to Admission medications   Medication Sig Start Date End Date Taking? Authorizing Provider  aspirin 81 MG tablet Take 81 mg by mouth daily.    Historical Provider, MD  atorvastatin (LIPITOR) 10 MG tablet Take 1 tablet (10 mg total) by mouth daily at 6 PM. 11/14/15   Ethelda Chick, MD  naproxen sodium (ANAPROX) 220 MG tablet Take 220 mg by mouth 2 (two) times daily with a meal.    Historical Provider, MD  OVER THE COUNTER MEDICATION     Historical Provider, MD   Social History   Social History  . Marital status: Married    Spouse name: N/A  . Number of children: N/A  . Years of education: N/A   Occupational History  . Not on file.   Social History Main Topics  . Smoking status: Never Smoker  . Smokeless tobacco: Not on file  . Alcohol use No  . Drug use: No  . Sexual activity: Not Currently   Other Topics Concern  . Not on file   Social History Narrative   Marital status: married      Children: 2 daughters      Lives:  With wife  Employment: printing work      Tobacco: none      Alcohol: one beer every three months      Drugs:  None      Exercise:  No formal exercise; walks up three flights of stairs several times per day at work.   Review of Systems  Constitutional: Positive for diaphoresis (clamy) and fatigue.  Respiratory: Negative for chest tightness, shortness of breath and wheezing.   Cardiovascular: Positive for leg swelling. Negative for chest pain.  Gastrointestinal: Negative for abdominal pain.  Musculoskeletal: Positive for back pain and gait problem.  Neurological: Positive for speech difficulty and weakness. Negative for dizziness and light-headedness.    Objective:  Physical Exam  Constitutional: He appears well-developed and well-nourished. No  distress.  HENT:  Head: Normocephalic and atraumatic.  Eyes: Conjunctivae are normal.  Neck: Neck supple.  Cardiovascular: Normal rate.   Pulmonary/Chest: Effort normal.  Abdominal:  No palpable abdominal pulse  Musculoskeletal:  2+ pedal edema, R>L  Neurological: He is alert.  [place] hospital scale negative No pronator drift  Skin: Skin is warm and dry.  Psychiatric: He has a normal mood and affect. His behavior is normal.  Nursing note and vitals reviewed.   Vitals:   10/12/16 1741 10/12/16 1749  BP: (!) 220/110 (!) 219/138  Pulse: 100 (!) 106  Resp: 18   SpO2: 97%   Weight: 270 lb (122.5 kg)      EKG Reading: Sinus Tachycardia. Possible left ventricular hypertrophy on non-voltage basis.   -Nonspecific ST depression  -Seen with left ventricular hypertrophy (strain) or digitalis effect.   6:01 PM EMS called for transport  6:20 PM Report given with transfer of care.  Assessment & Plan:  Jason CouncilmanJr Andrea Garringer is a 61 y.o. male Hypertensive emergency - Plan: EKG 12-Lead  Right arm weakness - Plan: EKG 12-Lead  Right leg weakness - Plan: EKG 12-Lead  Other speech disturbance  Hypertensive Emergency with nonspecific right-sided weakness of arm, leg over the past 2 days. Also with some difficulty forming words from thoughts but no slurred speech since yesterday.  [place] prehospital scale negative in office with no focal weakness,drift or slurred speech. Significant hypertension and previous symptoms concerning for hypertensive emergency, or TIA, or ICB.   -  Monitor placed, EKG obtained which shows likely strain pattern, EMS called for transport and advised emergency department staff.  Level 5 with severity of illness and need for emergent eval and transport.   No orders of the defined types were placed in this encounter.  There are no Patient Instructions on file for this visit.  I personally performed the services described in this documentation, which was  scribed in my presence. The recorded information has been reviewed and considered for accuracy and completeness, addended by me as needed, and agree with information above.  Signed,   Meredith StaggersJeffrey Trygve Thal, MD Primary [facility] at [facility].  10/12/16 6:25 PM

## 2016-10-12 NOTE — ED Notes (Signed)
Patient began to feel like he was going to pass out and heart rate dropped to 28, the patient got diaphoretic, edp at bedside and patient placed on zoll pads. 1 mg of atropine given, repeat ekg done, patient is now aphasic again, heart rate responded well and is now 98

## 2016-10-12 NOTE — ED Triage Notes (Signed)
Pt from [facility]. Pt complaining of R sided weakness since Saturday. Pt a/o x 4 upon arrival. Pt also with new onset htn today. Pt BP = 212/114. Pt denies any headache or dizziness. Pt with no focal neuro deficits upon arrival.

## 2016-10-12 NOTE — Patient Instructions (Signed)
Sent by EMS.  

## 2016-10-12 NOTE — Code Documentation (Signed)
This 61 y/o white male pt arrived [facility] at [id number] via EMS for evaluation of Bilateral Right arm and leg weakness for the past two days and today at work had trouble with right hand gripping and word finding.  His CT head read at 2112 today showed no acute findings.    Pt was given Apresoline  5 mg IV  For BP 199/92 at 2235, became hypotensive and bradycardic.  Pt then proceded to become   confused and unable to raise his right arm and leg.  Dr Freida BusmanAllen was called to the bedside and a code stroke was called at 2314.  Pt was treated with atropine and IV fluids and as his blood pressure and heart rate normalized his weakness and confusion abated. He scored a 3 on the NIHSS with points given for right arm and leg weakness.  TPA was not given due to being outside of the TPA window.  His LSN was 2 days ago.  Pt will have an MRI tonight and be admitted to Medicine service

## 2016-10-13 ENCOUNTER — Emergency Department (HOSPITAL_COMMUNITY): Payer: 59

## 2016-10-13 ENCOUNTER — Encounter (HOSPITAL_COMMUNITY): Payer: Self-pay | Admitting: Internal Medicine

## 2016-10-13 ENCOUNTER — Inpatient Hospital Stay (HOSPITAL_COMMUNITY): Payer: 59

## 2016-10-13 DIAGNOSIS — I10 Essential (primary) hypertension: Secondary | ICD-10-CM

## 2016-10-13 DIAGNOSIS — E785 Hyperlipidemia, unspecified: Secondary | ICD-10-CM | POA: Diagnosis not present

## 2016-10-13 DIAGNOSIS — I639 Cerebral infarction, unspecified: Secondary | ICD-10-CM | POA: Diagnosis not present

## 2016-10-13 DIAGNOSIS — I493 Ventricular premature depolarization: Secondary | ICD-10-CM | POA: Diagnosis not present

## 2016-10-13 DIAGNOSIS — E669 Obesity, unspecified: Secondary | ICD-10-CM | POA: Diagnosis present

## 2016-10-13 DIAGNOSIS — I110 Hypertensive heart disease with heart failure: Secondary | ICD-10-CM | POA: Diagnosis present

## 2016-10-13 DIAGNOSIS — I509 Heart failure, unspecified: Secondary | ICD-10-CM | POA: Diagnosis not present

## 2016-10-13 DIAGNOSIS — I420 Dilated cardiomyopathy: Secondary | ICD-10-CM | POA: Diagnosis not present

## 2016-10-13 DIAGNOSIS — I63522 Cerebral infarction due to unspecified occlusion or stenosis of left anterior cerebral artery: Principal | ICD-10-CM

## 2016-10-13 DIAGNOSIS — Z8673 Personal history of transient ischemic attack (TIA), and cerebral infarction without residual deficits: Secondary | ICD-10-CM | POA: Diagnosis not present

## 2016-10-13 DIAGNOSIS — I6789 Other cerebrovascular disease: Secondary | ICD-10-CM | POA: Diagnosis not present

## 2016-10-13 DIAGNOSIS — I429 Cardiomyopathy, unspecified: Secondary | ICD-10-CM | POA: Diagnosis present

## 2016-10-13 DIAGNOSIS — I5022 Chronic systolic (congestive) heart failure: Secondary | ICD-10-CM | POA: Diagnosis present

## 2016-10-13 DIAGNOSIS — I6522 Occlusion and stenosis of left carotid artery: Secondary | ICD-10-CM

## 2016-10-13 DIAGNOSIS — Z79899 Other long term (current) drug therapy: Secondary | ICD-10-CM | POA: Diagnosis not present

## 2016-10-13 DIAGNOSIS — I119 Hypertensive heart disease without heart failure: Secondary | ICD-10-CM | POA: Insufficient documentation

## 2016-10-13 DIAGNOSIS — E66811 Obesity, class 1: Secondary | ICD-10-CM

## 2016-10-13 DIAGNOSIS — R4701 Aphasia: Secondary | ICD-10-CM | POA: Diagnosis present

## 2016-10-13 DIAGNOSIS — I63132 Cerebral infarction due to embolism of left carotid artery: Secondary | ICD-10-CM | POA: Diagnosis not present

## 2016-10-13 DIAGNOSIS — I6523 Occlusion and stenosis of bilateral carotid arteries: Secondary | ICD-10-CM | POA: Diagnosis present

## 2016-10-13 DIAGNOSIS — R531 Weakness: Secondary | ICD-10-CM | POA: Diagnosis present

## 2016-10-13 DIAGNOSIS — Z6836 Body mass index (BMI) 36.0-36.9, adult: Secondary | ICD-10-CM | POA: Diagnosis not present

## 2016-10-13 DIAGNOSIS — G8191 Hemiplegia, unspecified affecting right dominant side: Secondary | ICD-10-CM | POA: Diagnosis present

## 2016-10-13 DIAGNOSIS — E7800 Pure hypercholesterolemia, unspecified: Secondary | ICD-10-CM | POA: Diagnosis present

## 2016-10-13 HISTORY — DX: Personal history of transient ischemic attack (TIA), and cerebral infarction without residual deficits: Z86.73

## 2016-10-13 LAB — RAPID URINE DRUG SCREEN, HOSP PERFORMED
Amphetamines: NOT DETECTED
Barbiturates: NOT DETECTED
Benzodiazepines: NOT DETECTED
Cocaine: NOT DETECTED
Opiates: NOT DETECTED
Tetrahydrocannabinol: NOT DETECTED

## 2016-10-13 LAB — URINALYSIS, ROUTINE W REFLEX MICROSCOPIC
Bilirubin Urine: NEGATIVE
Glucose, UA: NEGATIVE mg/dL
Hgb urine dipstick: NEGATIVE
Ketones, ur: NEGATIVE mg/dL
Leukocytes, UA: NEGATIVE
Nitrite: NEGATIVE
Protein, ur: NEGATIVE mg/dL
Specific Gravity, Urine: 1.015 (ref 1.005–1.030)
pH: 6 (ref 5.0–8.0)

## 2016-10-13 LAB — CBC WITH DIFFERENTIAL/PLATELET
Basophils Absolute: 0 10*3/uL (ref 0.0–0.1)
Basophils Relative: 0 %
Eosinophils Absolute: 0.1 10*3/uL (ref 0.0–0.7)
Eosinophils Relative: 1 %
HCT: 42.4 % (ref 39.0–52.0)
Hemoglobin: 14.1 g/dL (ref 13.0–17.0)
Lymphocytes Relative: 26 %
Lymphs Abs: 2.1 10*3/uL (ref 0.7–4.0)
MCH: 30.5 pg (ref 26.0–34.0)
MCHC: 33.3 g/dL (ref 30.0–36.0)
MCV: 91.8 fL (ref 78.0–100.0)
Monocytes Absolute: 0.4 10*3/uL (ref 0.1–1.0)
Monocytes Relative: 5 %
Neutro Abs: 5.5 10*3/uL (ref 1.7–7.7)
Neutrophils Relative %: 68 %
Platelets: 137 10*3/uL — ABNORMAL LOW (ref 150–400)
RBC: 4.62 MIL/uL (ref 4.22–5.81)
RDW: 13.1 % (ref 11.5–15.5)
WBC: 8 10*3/uL (ref 4.0–10.5)

## 2016-10-13 LAB — LIPID PANEL
Cholesterol: 176 mg/dL (ref 0–200)
HDL: 26 mg/dL — ABNORMAL LOW (ref >40)
LDL Cholesterol: 109 mg/dL — ABNORMAL HIGH (ref 0–99)
Total CHOL/HDL Ratio: 6.8 RATIO
Triglycerides: 206 mg/dL — ABNORMAL HIGH (ref <150)
VLDL: 41 mg/dL — ABNORMAL HIGH (ref 0–40)

## 2016-10-13 LAB — BASIC METABOLIC PANEL
Anion gap: 11 (ref 5–15)
BUN: 10 mg/dL (ref 6–20)
CO2: 24 mmol/L (ref 22–32)
Calcium: 9.1 mg/dL (ref 8.9–10.3)
Chloride: 105 mmol/L (ref 101–111)
Creatinine, Ser: 1.05 mg/dL (ref 0.61–1.24)
GFR calc Af Amer: >60 mL/min (ref >60)
GFR calc non Af Amer: >60 mL/min (ref >60)
Glucose, Bld: 153 mg/dL — ABNORMAL HIGH (ref 65–99)
Potassium: 3.8 mmol/L (ref 3.5–5.1)
Sodium: 140 mmol/L (ref 135–145)

## 2016-10-13 LAB — ECHOCARDIOGRAM COMPLETE
Height: 72 in
Weight: 4256 oz

## 2016-10-13 MED ORDER — SENNOSIDES-DOCUSATE SODIUM 8.6-50 MG PO TABS
1.0000 | ORAL_TABLET | Freq: Every evening | ORAL | Status: DC | PRN
  Filled 2016-10-13: qty 1

## 2016-10-13 MED ORDER — SODIUM CHLORIDE 0.9 % IV SOLN
INTRAVENOUS | Status: DC
  Administered 2016-10-13: 03:00:00 via INTRAVENOUS
  Administered 2016-10-13 (×2): 75 mL/h via INTRAVENOUS
  Administered 2016-10-14: 05:00:00 via INTRAVENOUS
  Administered 2016-10-16: 250 mL via INTRAVENOUS

## 2016-10-13 MED ORDER — IOPAMIDOL (ISOVUE-370) INJECTION 76%
INTRAVENOUS | Status: AC
  Administered 2016-10-13: 50 mL
  Filled 2016-10-13: qty 50

## 2016-10-13 MED ORDER — STROKE: EARLY STAGES OF RECOVERY BOOK
Freq: Once | Status: AC
  Administered 2016-10-13: 03:00:00
  Filled 2016-10-13: qty 1

## 2016-10-13 MED ORDER — ACETAMINOPHEN 650 MG RE SUPP: 650.0000 mg | RECTAL | Status: DC | PRN

## 2016-10-13 MED ORDER — ACETAMINOPHEN 325 MG PO TABS: 650.0000 mg | ORAL_TABLET | ORAL | Status: DC | PRN

## 2016-10-13 MED ORDER — ATORVASTATIN CALCIUM 40 MG PO TABS
40.0000 mg | ORAL_TABLET | Freq: Every day | ORAL | Status: DC
  Administered 2016-10-14 – 2016-10-17 (×4): 40 mg via ORAL
  Filled 2016-10-13 (×4): qty 1

## 2016-10-13 MED ORDER — ENOXAPARIN SODIUM 40 MG/0.4ML ~~LOC~~ SOLN
40.0000 mg | SUBCUTANEOUS | Status: DC
  Administered 2016-10-13 – 2016-10-15 (×3): 40 mg via SUBCUTANEOUS
  Filled 2016-10-13 (×3): qty 0.4

## 2016-10-13 MED ORDER — GADOBENATE DIMEGLUMINE 529 MG/ML IV SOLN
20.0000 mL | Freq: Once | INTRAVENOUS | Status: AC | PRN
  Administered 2016-10-13: 20 mL via INTRAVENOUS

## 2016-10-13 MED ORDER — ACETAMINOPHEN 160 MG/5ML PO SOLN: 650.0000 mg | ORAL | Status: DC | PRN

## 2016-10-13 MED ORDER — ASPIRIN 325 MG PO TABS
325.0000 mg | ORAL_TABLET | Freq: Every day | ORAL | Status: DC
  Administered 2016-10-13 – 2016-10-15 (×3): 325 mg via ORAL
  Filled 2016-10-13 (×3): qty 1

## 2016-10-13 MED ORDER — ATORVASTATIN CALCIUM 10 MG PO TABS
10.0000 mg | ORAL_TABLET | Freq: Every day | ORAL | Status: DC
  Filled 2016-10-13: qty 1

## 2016-10-13 MED FILL — Medication: Qty: 1 | Status: AC

## 2016-10-13 NOTE — Progress Notes (Signed)
**  Preliminary report by tech**  Bilateral lower extremity venous duplex completed. There is no evidence of deep or superficial vein thrombosis involving the right and left lower extremities. All visualized vessels appear patent and compressible. There is no evidence of Baker's cysts bilaterally.  10/13/16 2:04 PM Olen CordialGreg Dorothye Berni RVT

## 2016-10-13 NOTE — Progress Notes (Signed)
  Echocardiogram 2D Echocardiogram has been performed.  Arvil ChacoFoster, Dashonda Bonneau 10/13/2016, 2:54 PM

## 2016-10-13 NOTE — Evaluation (Signed)
Physical Therapy Evaluation & Discharge Patient Details Name: Jason Melendez MRN: [id number] DOB: 02-10-1956 Today's Date: 10/13/2016   History of Present Illness  Jason Melendez is a [age] y.o. male with medical history significant of HTN; who presented with a three-day history of intermittent right-sided weakness.  Clinical Impression   Patient present close to baseline for mobility.  Feel he will progress without further skilled PT to his functional baseline. PT to sign off.     Follow Up Recommendations No PT follow up    Equipment Recommendations  None recommended by PT    Recommendations for Other Services       Precautions / Restrictions Precautions Precaution Comments: watch BP      Mobility  Bed Mobility Overal bed mobility: Needs Assistance Bed Mobility: Supine to Sit     Supine to sit: Min assist     General bed mobility comments: to lift trunk due to feels "stuck" in the hole  Transfers Overall transfer level: Needs assistance   Transfers: Sit to/from Stand Sit to Stand: Supervision         General transfer comment: initially for safety/balance, then indep stood from toilet not UE support  Ambulation/Gait Ambulation/Gait assistance: Independent Ambulation Distance (Feet): 250 Feet Assistive device: None Gait Pattern/deviations: Step-through pattern;WFL(Within Functional Limits)     General Gait Details: no LOB, see DGI  Stairs Stairs: Yes Stairs assistance: Modified independent (Device/Increase time) Stair Management: Alternating pattern;Forwards;One rail Right Number of Stairs: 2 General stair comments: safe technique, no issues with balance  Wheelchair Mobility    Modified Rankin (Stroke Patients Only) Modified Rankin (Stroke Patients Only) Pre-Morbid Rankin Score: No symptoms Modified Rankin: Slight disability     Balance Overall balance assessment: Independent (able to balance on R in SLS at least 8 sec)                                Standardized Balance Assessment Standardized Balance Assessment : Dynamic Gait Index   Dynamic Gait Index Level Surface: Normal Change in Gait Speed: Mild Impairment Gait with Horizontal Head Turns: Normal Gait with Vertical Head Turns: Normal Gait and Pivot Turn: Normal Step Over Obstacle: Normal Step Around Obstacles: Normal Steps: Mild Impairment Total Score: 22       Pertinent Vitals/Pain Pain Assessment: No/denies pain    Home Living Family/patient expects to be discharged to:: Private residence Living Arrangements: Spouse/significant other Available Help at Discharge: Family Type of Home: House Home Access: Stairs to enter Entrance Stairs-Rails: Doctor, general practiceight;Left Entrance Stairs-Number of Steps: 4 Home Layout: One level Home Equipment: None      Prior Function Level of Independence: Independent         Comments: works as Publishing copyphoto refinisher     Hand Dominance        Extremity/Trunk Assessment   Upper Extremity Assessment Upper Extremity Assessment: Defer to OT evaluation    Lower Extremity Assessment Lower Extremity Assessment: Overall WFL for tasks assessed       Communication   Communication: No difficulties  Cognition Arousal/Alertness: Awake/alert Behavior During Therapy: WFL for tasks assessed/performed Overall Cognitive Status: Within Functional Limits for tasks assessed                      General Comments General comments (skin integrity, edema, etc.): toileted and washed hands and brushed teeth in standing without assist    Exercises     Assessment/Plan  PT Assessment Patent does not need any further PT services  PT Problem List            PT Treatment Interventions      PT Goals (Current goals can be found in the Care Plan section)  Acute Rehab PT Goals PT Goal Formulation: All assessment and education complete, DC therapy    Frequency     Barriers to discharge        Co-evaluation                End of Session Equipment Utilized During Treatment: Gait belt Activity Tolerance: Patient tolerated treatment well Patient left: in chair (w/c to transport to CT)           Time: 1100-1126 PT Time Calculation (min) (ACUTE ONLY): 26 min   Charges:   PT Evaluation $PT Eval Moderate Complexity: 1 Procedure PT Treatments $Gait Training: 8-22 mins   PT G CodesElray Mcgregor 22-Oct-2016, 12:11 PM  Sheran Lawless, PT [phone] 22-Oct-2016

## 2016-10-13 NOTE — Care Management Note (Signed)
Case Management Note  Patient Details  Name: Geryl CouncilmanJr Ildefonso Fairburn MRN: [id number] Date of Birth: 1956-07-15  Subjective/Objective:        Patient was admitted with CVA. Lives at home with spouse. CM will follow for discharge needs pending PT/OT evals and physician orders.            Action/Plan:   Expected Discharge Date:                  Expected Discharge Plan:     In-House Referral:     Discharge planning Services     Post Acute Care Choice:    Choice offered to:     DME Arranged:    DME Agency:     HH Arranged:    HH Agency:     Status of Service:     If discussed at [employer] Length of Stay Meetings, dates discussed:    Additional Comments:  Anda KraftRobarge, Saphire Barnhart C, RN 10/13/2016, 9:52 AM

## 2016-10-13 NOTE — H&P (Signed)
History and Physical    Jason Melendez JYN:[id number] DOB: 20-Jun-1956 DOA: 10/12/2016  Referring MD/NP/PA: Antony Madura, PA-C PCP: No PCP Per Patient  Patient coming from:  Chief Complaint: Right-sided weakness  HPI: Jason Melendez is a 60 y.o. male with medical history significant of HTN; who presented with a three-day history of intermittent right-sided weakness. At baseline patient is right-handed. He reports symptoms initially feeling as though his right leg and arm didn't belong to him. He found it unusual that his 5 pound work bag felt like it weighed 15 pounds. He intermittently reported dropping things out of his right hand and feeling like it took extra effort to make a step with the right leg. Associated symptoms included knowing what he wants to say, but being unable to say it. He went to be evaluated today and was noted to have elevated blood pressure into the 200s for which he was sent to the emergency department for further evaluation.   ED Course:   Patient was noted to have blood pressure as high as 220/110 . Patient appears to have been given 5 mg of hydralazine IV. He subsequently developed hypotension 83/57 heart rates in the 30s after replacement of a larger IV. Patient reports feeling hot and flushed prior to passing out. During this acute situation patient developed worsening right-sided weakness. Patient was given atropine and IV fluids with improvement of blood pressures. Lab work revealed platelets 145, and all other labs relatively within normal limits. Initial chest x-ray and CT of the brain showed no acute abnormalities. Neurology was consulted and thought that the initial CT may have shown signs of a subacute stroke. Recommended MRI of the brain.  Review of Systems: As per HPI otherwise 10 point review of systems negative.   Past Medical History:  Diagnosis Date  . HTN (hypertension)     Past Surgical History:  Procedure Laterality Date  . HERNIA REPAIR        reports that he has never smoked. He has never used smokeless tobacco. He reports that he does not drink alcohol or use drugs.  No Known Allergies  Family History  Problem Relation Age of Onset  . Diabetes Mother   . Heart disease Father 18    CABG  . Diabetes Father   . Kidney disease Father   . Kidney failure Father   . Hypertension Sister     Prior to Admission medications   Medication Sig Start Date End Date Taking? Authorizing Provider  aspirin 81 MG tablet Take 81 mg by mouth daily.   Yes Historical Provider, MD  atorvastatin (LIPITOR) 10 MG tablet Take 1 tablet (10 mg total) by mouth daily at 6 PM. 11/14/15  Yes Ethelda Chick, MD  naproxen sodium (ANAPROX) 220 MG tablet Take 220 mg by mouth 2 (two) times daily with a meal.   Yes Historical Provider, MD    Physical Exam:    Constitutional: NAD, calm, comfortable Vitals:   10/12/16 2345 10/12/16 2350 10/12/16 2355 10/13/16 0000  BP: 161/97 173/96 174/99 175/90  Pulse: 99 100 100 99  Resp: 16 15 19 13   SpO2: 99% 100% 99% 99%  Weight:      Height:       Eyes: PERRL, lids and conjunctivae normal ENMT: Mucous membranes are moist. Posterior pharynx clear of any exudate or lesions.  Neck: normal, supple, no masses, no thyromegaly Respiratory: clear to auscultation bilaterally, no wheezing, no crackles. Normal respiratory effort. No accessory muscle use.  Cardiovascular: Mildly tachycardic, no murmurs / rubs / gallops. No extremity edema. 2+ pedal pulses. No carotid bruits.  Abdomen: no tenderness, no masses palpated. No hepatosplenomegaly. Bowel sounds positive.  Musculoskeletal: no clubbing / cyanosis. No joint deformity upper and lower extremities. Good ROM, no contractures. Normal muscle tone.  Skin: no rashes, lesions, ulcers. No induration Neurologic: CN 2-12 grossly intact. Right upper and lower extremity 4/5. Left upper and lower extremity 5 out of 5. Patient with expressive aphasia. Psychiatric: Normal  judgment and insight. Alert and oriented x 3. Normal mood.     Labs on Admission: I have personally reviewed following labs and imaging studies  CBC:  Recent Labs Lab 10/12/16 1949  WBC 6.4  NEUTROABS 3.4  HGB 14.8  HCT 44.5  MCV 91.2  PLT 145*   Basic Metabolic Panel:  Recent Labs Lab 10/12/16 1949  NA 141  K 3.9  CL 106  CO2 25  GLUCOSE 117*  BUN 12  CREATININE 1.01  CALCIUM 9.5   GFR: Estimated Creatinine Clearance: 104.1 mL/min (by C-G formula based on SCr of 1.01 mg/dL). Liver Function Tests:  Recent Labs Lab 10/12/16 2322  AST 34  ALT 18  ALKPHOS 34*  BILITOT 0.9  PROT 6.0*  ALBUMIN 3.5   No results for input(s): LIPASE, AMYLASE in the last 168 hours. No results for input(s): AMMONIA in the last 168 hours. Coagulation Profile:  Recent Labs Lab 10/12/16 2058  INR 1.05   Cardiac Enzymes: No results for input(s): CKTOTAL, CKMB, CKMBINDEX, TROPONINI in the last 168 hours. BNP (last 3 results) No results for input(s): PROBNP in the last 8760 hours. HbA1C: No results for input(s): HGBA1C in the last 72 hours. CBG: No results for input(s): GLUCAP in the last 168 hours. Lipid Profile: No results for input(s): CHOL, HDL, LDLCALC, TRIG, CHOLHDL, LDLDIRECT in the last 72 hours. Thyroid Function Tests: No results for input(s): TSH, T4TOTAL, FREET4, T3FREE, THYROIDAB in the last 72 hours. Anemia Panel: No results for input(s): VITAMINB12, FOLATE, FERRITIN, TIBC, IRON, RETICCTPCT in the last 72 hours. Urine analysis:    Component Value Date/Time   COLORURINE YELLOW 10/12/2016 2337   APPEARANCEUR CLEAR 10/12/2016 2337   LABSPEC 1.015 10/12/2016 2337   PHURINE 6.0 10/12/2016 2337   GLUCOSEU NEGATIVE 10/12/2016 2337   HGBUR NEGATIVE 10/12/2016 2337   BILIRUBINUR NEGATIVE 10/12/2016 2337   BILIRUBINUR negative 07/21/2015 0942   KETONESUR NEGATIVE 10/12/2016 2337   PROTEINUR NEGATIVE 10/12/2016 2337   UROBILINOGEN 0.2 07/21/2015 0942   NITRITE  NEGATIVE 10/12/2016 2337   LEUKOCYTESUR NEGATIVE 10/12/2016 2337   Sepsis Labs: No results found for this or any previous visit (from the past 240 hour(s)).   Radiological Exams on Admission: Dg Chest 2 View  Result Date: 10/12/2016 CLINICAL DATA:  Initial evaluation for acute right-sided chest pain. EXAM: CHEST  2 VIEW COMPARISON:  None. FINDINGS: The cardiac and mediastinal silhouettes are within normal limits. The lungs are normally inflated. No airspace consolidation, pleural effusion, or pulmonary edema is identified. There is no pneumothorax. No acute osseous abnormality identified. IMPRESSION: No active cardiopulmonary disease. Electronically Signed   By: Rise MuBenjamin  McClintock M.D.   On: 10/12/2016 21:33   Ct Head Wo Contrast  Result Date: 10/12/2016 CLINICAL DATA:  Right leg and arm weakness.  Hypertension. EXAM: CT HEAD WITHOUT CONTRAST TECHNIQUE: Contiguous axial images were obtained from the base of the skull through the vertex without intravenous contrast. COMPARISON:  None. FINDINGS: Brain: No mass lesion, intraparenchymal hemorrhage or extra-axial collection.  No evidence of acute cortical infarct. Brain parenchyma and CSF-containing spaces are normal for age. Left parafalcine arachnoid cyst. Vascular: No hyperdense vessel or unexpected calcification. Skull: Normal visualized skull base, calvarium and extracranial soft tissues. Sinuses/Orbits: No sinus fluid levels or advanced mucosal thickening. No mastoid effusion. Normal orbits. IMPRESSION: No acute intracranial abnormality. Electronically Signed   By: Deatra Robinson M.D.   On: 10/12/2016 21:12    EKG: Independently reviewed. 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 Patient presents with three-day history of intermittent weakness on the right side and difficulty getting his words out. 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 - Admit to telemetry  bed - Stroke order set initiated - PT/OT/Sp to eval and treat - Check echocardiogram in a.m. - ASA  - Appreciate neurology consultative services, will follow-up for further recommendations  Essential hypertension - Continue to monitor - consider starting patient on oral antihypertensive agent  Hyperlipidemia - Continue atorvastatin, consider need of dose adjustment   DVT prophylaxis: Lovenox Code Status: Full  Family Communication:No family present at bedside   Disposition Plan: TBD Consults called:neurology Admission status: inpatient  Clydie Braun MD [facility] Pager [phone]  If 7PM-7AM, please contact night-coverage [email or web address] Password [facility]  10/13/2016, 12:51 AM

## 2016-10-13 NOTE — Consult Note (Signed)
Neurology Consultation Reason for Consult: Right-sided weakness Referring Physician: Freida Busman, a  CC: Right-sided weakness  History is obtained from: Patient  HPI: Jason Melendez is a 61 y.o. male who presents with right-sided weakness for the past 3 days. He noticed that when he get out of the car, his leg did not feel quite like it was supporting them appropriately. He also notes that he was dropping things from his right hand. He came in earlier than a head CT which is normal. He had hypertension at 199 systolic and therefore an IV was started to give hydralazine.  With this, he vagal with a heart rate down to 28. Systolic dropped to the 80s. He had presyncopal and his right-sided weakness got considerably worse. He is given some fluids with blood pressure returned to the 140s to 150s and his symptoms improved, that he continues to have significant right arm and leg weakness. A code stroke was activated.  LKW: Saturday 2/11 tpa given?: no, out of window   ROS: A 14 point ROS was performed and is negative except as noted in the HPI.   PMH: High cholesterol   Family History  Problem Relation Age of Onset  . Diabetes Mother   . Heart disease Father 19    CABG  . Diabetes Father   . Kidney disease Father   . Kidney failure Father   . Hypertension Sister      Social History:  reports that he has never smoked. He has never used smokeless tobacco. He reports that he does not drink alcohol or use drugs.   Exam: Current vital signs: BP 175/90   Pulse 99   Resp 13   Ht 6' (1.829 m)   Wt 120.2 kg (265 lb)   SpO2 99%   BMI 35.94 kg/m  Vital signs in last 24 hours: Pulse Rate:  [27-111] 99 (02/13 0000) Resp:  [13-28] 13 (02/13 0000) BP: (83-220)/(46-138) 175/90 (02/13 0000) SpO2:  [95 %-100 %] 99 % (02/13 0000) Weight:  [120.2 kg (265 lb)-122.5 kg (270 lb)] 120.2 kg (265 lb) (02/12 1937)   Physical Exam  Constitutional: Appears well-developed and well-nourished.   Psych: Affect appropriate to situation Eyes: No scleral injection HENT: No OP obstrucion Head: Normocephalic.  Cardiovascular: Normal rate and regular rhythm.  Respiratory: Effort normal and breath sounds normal to anterior ascultation GI: Soft.  No distension. There is no tenderness.  Skin: WDI  Neuro: Mental Status: Patient is awake, alert, oriented to person, place, month, year, and situation. Patient is able to give a clear and coherent history. No signs of neglect He has a very mild hesitancy while speaking Cranial Nerves: II: Visual Fields are full. Pupils are equal, round, and reactive to light.   III,IV, VI: EOMI without ptosis or diploplia.  V: Facial sensation is symmetric to temperature VII: Facial movement is symmetric.  VIII: hearing is intact to voice X: Uvula elevates symmetrically XI: Shoulder shrug is symmetric. XII: tongue is midline without atrophy or fasciculations.  Motor: Tone is normal. Bulk is normal. He has 5/5 strength throughout the left side, on the right he has 4/5 strength in the right arm 2/5 strength in the right leg. Sensory: Sensation is symmetric to light touch and temperature in the arms and legs. Cerebellar: FNF and HKS are intact on the left, consistent with weakness on the right   I have reviewed labs in epic and the results pertinent to this consultation are: BMP-unremarkable  I have reviewed the images  obtained: CT head-hypodensity in the left ACA territory  Impression: 61 yo M with ACA infarct which did appear to worsen with severe hypotension during his vagal event. This could be thrombotic or embolic and he will need further workup.  Recommendations: 1. HgbA1c, fasting lipid panel 2. MRI  of the brain without contrast 3. Frequent neuro checks 4. Echocardiogram 5. MRA head and neck, no dopplers needed 6. Prophylactic therapy-Antiplatelet med: Aspirin - dose 325mg  PO or 300mg  PR 7. Risk factor modification 8. Telemetry  monitoring 9. PT consult, OT consult, Speech consult 10. Stroke team to follow.    Ritta SlotMcNeill Nyomie Ehrlich, MD [facility] [phone]  If 7pm- 7am, please page neurology on call as listed in AMION.

## 2016-10-13 NOTE — Progress Notes (Addendum)
STROKE TEAM PROGRESS NOTE   HISTORY OF PRESENT ILLNESS (per record) Jason Melendez is a 61 y.o. male who presents with right-sided weakness for the past 3 days. He noticed that when he get out of the car, his leg did not feel quite like it was supporting them appropriately. He also notes that he was dropping things from his right hand. He came in for a head CT which is normal. He had hypertension at 199 systolic and therefore an IV was started to give hydralazine.  With this, he vagal with a heart rate down to 28. Systolic dropped to the 80s. He had presyncopal and his right-sided weakness got considerably worse. He is given some fluids with blood pressure returned to the 140s to 150s and his symptoms improved, that he continues to have significant right arm and leg weakness. A code stroke was activated.  He was last known well 10/11/2016.  Patient was not administered IV t-PA secondary to being outside of the window. He was admitted for further evaluation and treatment.   SUBJECTIVE (INTERVAL HISTORY) Wife and daughters are at bedside. Patient right-sided weakness much improved. Only has slight weakness on the right lower extremity. Admitted history of hypertension and hyperlipidemia, not compliant with medication or monitoring. However denies any diabetes, smoking, alcohol, OSA, or illicit drugs.   OBJECTIVE Temp:  [98.6 F (37 C)] 98.6 F (37 C) (02/13 1000) Pulse Rate:  [27-111] 85 (02/13 1000) Cardiac Rhythm: Normal sinus rhythm (02/13 0700) Resp:  [13-28] 18 (02/13 0400) BP: (83-220)/(46-138) 170/86 (02/13 1000) SpO2:  [94 %-100 %] 94 % (02/13 1000) Weight:  [120.2 kg (265 lb)-122.5 kg (270 lb)] 120.7 kg (266 lb) (02/13 0236)  CBC:  Recent Labs Lab 10/12/16 1949 10/13/16 0217  WBC 6.4 8.0  NEUTROABS 3.4 5.5  HGB 14.8 14.1  HCT 44.5 42.4  MCV 91.2 91.8  PLT 145* 137*    Basic Metabolic Panel:  Recent Labs Lab 10/12/16 1949 10/13/16 0217  NA 141 140  K 3.9 3.8   CL 106 105  CO2 25 24  GLUCOSE 117* 153*  BUN 12 10  CREATININE 1.01 1.05  CALCIUM 9.5 9.1    Lipid Panel:    Component Value Date/Time   CHOL 176 10/13/2016 0217   TRIG 206 (H) 10/13/2016 0217   HDL 26 (L) 10/13/2016 0217   CHOLHDL 6.8 10/13/2016 0217   VLDL 41 (H) 10/13/2016 0217   LDLCALC 109 (H) 10/13/2016 0217   HgbA1c:  Lab Results  Component Value Date   HGBA1C 6.3 10/29/2015   Urine Drug Screen:    Component Value Date/Time   LABOPIA NONE DETECTED 10/12/2016 2337   COCAINSCRNUR NONE DETECTED 10/12/2016 2337   LABBENZ NONE DETECTED 10/12/2016 2337   AMPHETMU NONE DETECTED 10/12/2016 2337   THCU NONE DETECTED 10/12/2016 2337   LABBARB NONE DETECTED 10/12/2016 2337      IMAGING I have personally reviewed the radiological images below and agree with the radiology interpretations.  Ct Head Wo Contrast 10/12/2016 No acute intracranial abnormality.   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   Mr Jason Melendez W Or Wo Contrast 10/13/2016 50% stenosis bilateral proximal internal carotid artery origins.   Mr Brain Wo Contrast 10/13/2016 Acute nonhemorrhagic LEFT frontoparietal/ACA territory infarcts. Mild chronic small vessel ischemic disease.   Ct Angio Melendez W Or Wo Contrast 10/13/2016 IMPRESSION: 1. Soft and calcified bilateral carotid atherosclerosis in the Melendez  with no hemodynamically significant stenosis; 50% proximal left ICA stenosis. 2. Dominant right vertebral artery with no definite vertebral artery stenosis. 3. Carious posterior dentition.   LE venous Doppler - no DVT  TTE pending   PHYSICAL EXAM  Temp:  [98 F (36.7 C)-98.6 F (37 C)] 98 F (36.7 C) (02/13 1400) Pulse Rate:  [27-111] 90 (02/13 1400) Resp:  [13-28] 18 (02/13 1314) BP: (83-209)/(46-119) 172/95 (02/13 1630) SpO2:  [94 %-100 %] 98 % (02/13 1400) Weight:  [265 lb (120.2 kg)-266  lb (120.7 kg)] 266 lb (120.7 kg) (02/13 0236)  General - Well nourished, well developed, in no apparent distress.  Ophthalmologic - Sharp disc margins OU.   Cardiovascular - Regular rate and rhythm.  Mental Status -  Level of arousal and orientation to time, place, and person were intact. Language including expression, naming, repetition, comprehension was assessed and found intact. Attention span and concentration were normal. Fund of Knowledge was assessed and was intact.  Cranial Nerves II - XII - II - Visual field intact OU. III, IV, VI - Extraocular movements intact. V - Facial sensation intact bilaterally. VII - Facial movement intact bilaterally. VIII - Hearing & vestibular intact bilaterally. X - Palate elevates symmetrically. XI - Chin turning & shoulder shrug intact bilaterally. XII - Tongue protrusion intact.  Motor Strength - The patient's strength was normal in all extremities except right LE 5-/5 and pronator drift was absent.  Bulk was normal and fasciculations were absent.   Motor Tone - Muscle tone was assessed at the Melendez and appendages and was normal.  Reflexes - The patient's reflexes were 1+ in all extremities and he had no pathological reflexes.  Sensory - Light touch, temperature/pinprick were assessed and were symmetrical.    Coordination - The patient had normal movements in the hands with no ataxia or dysmetria.  Tremor was absent.  Gait and Station - deferred   ASSESSMENT/PLAN [name] is a 61 y.o. male with history of hypertension presenting with right-sided weakness 3 days. He did not receive IV t-PA due to delay in arrival.   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  Resultant  right LE slightly weakness  CT of head. No acute abnormality   MRI  left frontoparietal/ACA territory infarcts.  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 Yes; Fluid consistency: Thin  No antithrombotics prior to admission, now on aspirin 325 mg daily.   Patient counseled to be compliant with his antithrombotic medications  Ongoing aggressive stroke risk factor management  Therapy recommendations:  No PT  Disposition:  pending   Hypertension  Unstable  Permissive hypertension (OK if < 220/120) but gradually normalize in 5-7 days  Long-term BP goal normotensive  Hyperlipidemia  Home meds:  No statin  LDL 109, goal < 70  Lipitor 40 added  Continue statin at discharge  Other Stroke Risk Factors  UDS negative  Obesity, Body mass index is 36.08 kg/m., recommend weight loss, diet and exercise  as appropriate   Other Active Problems    Hospital day # 0  Marvel PlanJindong Zeyad Delaguila, MD PhD Stroke Neurology 10/13/2016 6:20 PM   ADDENDUM: EKG abnormalities  EKG - performed in ED 10/12/16 23:02, read by EDP as atrial fibrillation   However, it did not appear to me as afib, but consistent with sinus bradycardia with artifact  Recommended 30d cardiac event monitor as outpt to confirm  Marvel PlanJindong Cherokee Boccio, MD PhD Stroke Neurology 11/24/2016 1:37 PM   To contact Stroke Continuity provider, please refer to [email or web address]. After hours, contact General Neurology

## 2016-10-13 NOTE — ED Notes (Signed)
Patient transported to MRI 

## 2016-10-13 NOTE — Progress Notes (Signed)
PROGRESS NOTE                                                                                                                                                                                                             Patient Demographics:    Jason Melendez, is a 61 y.o. male, DOB - January 31, 1956, ZOX:[id number]  Admit date - 10/12/2016   Admitting Physician Clydie Braun, MD  Outpatient Primary MD for the patient is No PCP Per Patient  LOS - 0   No chief complaint on file.      Brief Narrative  61 year old male with history of hypertension, presents with right-sided weakness, workup significant for acute left frontoparietal CVA.   Subjective:    Jason Melendez today has, No headache, No chest pain, No abdominal pain - Report speech has improved, right upper extremity has improved, patient denies any chest pain or shortness of breath  Assessment  & Plan :    Principal Problem:   CVA (cerebral vascular accident) (HCC) Active Problems:   HTN (hypertension)   HLD (hyperlipidemia)  Acute CVA  - MRI brain significant for left frontoparietal acute CVA . - MRA head with Occluded mid to distal callosomarginal branch of th eanterior cerebral artery. Predominately azygos ACA. - MRA NECK: 50% stenosis bilateral proximal internal carotid artery - LDL is 109, started on pravastatin - Follow on 2 D echo - Follow on venous Doppler to rule out DVT today with right lower extremity edema, and elevated d-dimer - Continue with full dose aspirin  Hypertension - Allow for permissive hypertension  Hyperlipidemia - Patient reports he stopped taking statin at home, I resumed on atorvastatin, LDL of 109  Code Status : Full   Family Communication  : Wife and daughter at bedside   Disposition Plan  : Pending PT evaluation   Consults  :  neurology  Procedures  : none  DVT Prophylaxis  :  Lovenox   Lab Results  Component Value Date   PLT 137 (L)  10/13/2016    Antibiotics  :    Anti-infectives    None        Objective:   Vitals:   10/13/16 0400 10/13/16 0600 10/13/16 0800 10/13/16 1000  BP: (!) 155/88 (!) 168/99 (!) 179/87 (!) 170/86  Pulse: 97 86 85 85  Resp: 18  Temp:   98.6 F (37 C) 98.6 F (37 C)  TempSrc:   Oral Oral  SpO2: 98% 98% 96% 94%  Weight:      Height:        Wt Readings from Last 3 Encounters:  10/13/16 120.7 kg (266 lb)  10/12/16 122.5 kg (270 lb)  10/29/15 109.8 kg (242 lb)     Intake/Output Summary (Last 24 hours) at 10/13/16 1113 Last data filed at 10/13/16 0830  Gross per 24 hour  Intake             1504 ml  Output              400 ml  Net             1104 ml     Physical Exam  Awake Alert, Oriented X 3, No new F.N deficits, Normal affect [place].AT,PERRAL Supple Neck,No JVD, Symmetrical Chest wall movement, Good air movement bilaterally, CTAB RRR,No Gallops,Rubs or new Murmurs, No Parasternal Heave +ve B.Sounds, Abd Soft, No tenderness,No rebound - guarding or rigidity. No Cyanosis, Clubbing or edema, RLE motor 4/5    Data Review:    CBC  Recent Labs Lab 10/12/16 1949 10/13/16 0217  WBC 6.4 8.0  HGB 14.8 14.1  HCT 44.5 42.4  PLT 145* 137*  MCV 91.2 91.8  MCH 30.3 30.5  MCHC 33.3 33.3  RDW 13.1 13.1  LYMPHSABS 2.4 2.1  MONOABS 0.4 0.4  EOSABS 0.1 0.1  BASOSABS 0.0 0.0    Chemistries   Recent Labs Lab 10/12/16 1949 10/12/16 2322 10/13/16 0217  NA 141  --  140  K 3.9  --  3.8  CL 106  --  105  CO2 25  --  24  GLUCOSE 117*  --  153*  BUN 12  --  10  CREATININE 1.01  --  1.05  CALCIUM 9.5  --  9.1  AST  --  34  --   ALT  --  18  --   ALKPHOS  --  34*  --   BILITOT  --  0.9  --    ------------------------------------------------------------------------------------------------------------------  Recent Labs  10/13/16 0217  CHOL 176  HDL 26*  LDLCALC 109*  TRIG 206*  CHOLHDL 6.8    Lab Results  Component Value Date   HGBA1C 6.3 10/29/2015    ------------------------------------------------------------------------------------------------------------------ No results for input(s): TSH, T4TOTAL, T3FREE, THYROIDAB in the last 72 hours.  Invalid input(s): FREET3 ------------------------------------------------------------------------------------------------------------------ No results for input(s): VITAMINB12, FOLATE, FERRITIN, TIBC, IRON, RETICCTPCT in the last 72 hours.  Coagulation profile  Recent Labs Lab 10/12/16 2058  INR 1.05     Recent Labs  10/12/16 2058  DDIMER 0.89*    Cardiac Enzymes No results for input(s): CKMB, TROPONINI, MYOGLOBIN in the last 168 hours.  Invalid input(s): CK ------------------------------------------------------------------------------------------------------------------    Component Value Date/Time   BNP 27.0 10/12/2016 1949    Inpatient Medications  Scheduled Meds: . aspirin  325 mg Oral Daily  . atorvastatin  10 mg Oral q1800  . enoxaparin (LOVENOX) injection  40 mg Subcutaneous Q24H   Continuous Infusions: . sodium chloride 20 mL/hr at 10/12/16 2111  . sodium chloride 75 mL/hr (10/13/16 1006)   PRN Meds:.acetaminophen **OR** acetaminophen (TYLENOL) oral liquid 160 mg/5 mL **OR** acetaminophen, senna-docusate  Micro Results No results found for this or any previous visit (from the past 240 hour(s)).  Radiology Reports Dg Chest 2 View  Result Date: 10/12/2016 CLINICAL DATA:  Initial evaluation for acute  right-sided chest pain. EXAM: CHEST  2 VIEW COMPARISON:  None. FINDINGS: The cardiac and mediastinal silhouettes are within normal limits. The lungs are normally inflated. No airspace consolidation, pleural effusion, or pulmonary edema is identified. There is no pneumothorax. No acute osseous abnormality identified. IMPRESSION: No active cardiopulmonary disease. Electronically Signed   By: Rise MuBenjamin  McClintock M.D.   On: 10/12/2016 21:33   Ct Head Wo  Contrast  Result Date: 10/12/2016 CLINICAL DATA:  Right leg and arm weakness.  Hypertension. EXAM: CT HEAD WITHOUT CONTRAST TECHNIQUE: Contiguous axial images were obtained from the base of the skull through the vertex without intravenous contrast. COMPARISON:  None. FINDINGS: Brain: No mass lesion, intraparenchymal hemorrhage or extra-axial collection. No evidence of acute cortical infarct. Brain parenchyma and CSF-containing spaces are normal for age. Left parafalcine arachnoid cyst. Vascular: No hyperdense vessel or unexpected calcification. Skull: Normal visualized skull base, calvarium and extracranial soft tissues. Sinuses/Orbits: No sinus fluid levels or advanced mucosal thickening. No mastoid effusion. Normal orbits. IMPRESSION: No acute intracranial abnormality. Electronically Signed   By: Deatra RobinsonKevin  Herman M.D.   On: 10/12/2016 21:12   Mr Maxine GlennMra Head Wo Contrast  Result Date: 10/13/2016 CLINICAL DATA:  RIGHT-sided weakness for 2 days. Hypertensive, RIGHT-sided chest pain and elevated D-dimer. Confusion. Assess stroke. EXAM: MRI HEAD WITHOUT CONTRAST MRA HEAD WITHOUT CONTRAST MRA NECK WITHOUT AND WITH CONTRAST TECHNIQUE: Multiplanar, multiecho pulse sequences of the brain and surrounding structures were obtained without intravenous contrast. Angiographic images of the Circle of Willis were obtained using MRA technique without intravenous contrast. Angiographic images of the neck were obtained using MRA technique without and with intravenous contrast. Carotid stenosis measurements (when applicable) are obtained utilizing NASCET criteria, using the distal internal carotid diameter as the denominator. CONTRAST:  20mL MULTIHANCE GADOBENATE DIMEGLUMINE 529 MG/ML IV SOLN COMPARISON:  CT HEAD October 12, 2016 FINDINGS: MRI HEAD FINDINGS BRAIN: 2 x 3 cm area reduced diffusion LEFT mesial posterior frontal lobe. subcentimeter foci of reduced diffusion posterior to the dominant area, within LEFT mesial frontal and  parietal lobes, with corresponding low ADC values and slight bright FLAIR T2 signal. No susceptibility artifact to suggest hemorrhage. A few scattered subcentimeter supratentorial white matter FLAIR T2 hyperintensities. The ventricles and sulci are normal for patient's age. No suspicious parenchymal signal, masses or mass effect. No abnormal extra-axial fluid collections. VASCULAR: Normal major intracranial vascular flow voids present at skull base. SKULL AND UPPER CERVICAL SPINE: No abnormal sellar expansion. No suspicious calvarial bone marrow signal. Craniocervical junction maintained. SINUSES/ORBITS: The mastoid air-cells and included paranasal sinuses are well-aerated. The included ocular globes and orbital contents are non-suspicious. OTHER: Subcentimeter lymph nodes bilateral parotid glands. MRA HEAD FINDINGS ANTERIOR CIRCULATION: Normal flow related enhancement of the included cervical, petrous, cavernous and supraclinoid internal carotid arteries. Patent anterior communicating artery. Predominately azygos ACA, occluded branch of the callosum marginal mid to distal segment. Normal flow related enhancement of the middle cerebral arteries, including distal segments. No large vessel occlusion, high-grade stenosis, abnormal luminal irregularity, aneurysm. POSTERIOR CIRCULATION: Codominant vertebral artery's. Basilar artery is patent, with normal flow related enhancement of the main branch vessels. Normal flow related enhancement of the posterior cerebral arteries. No large vessel occlusion, high-grade stenosis, abnormal luminal irregularity, aneurysm. ANATOMIC VARIANTS: None. MRA NECK FINDINGS ANTERIOR CIRCULATION: The common carotid arteries are widely patent bilaterally. The carotid bifurcation are patent bilaterally. Within 5 mm of the bilateral internal carotid artery origins are 2 mm segments of 50% stenosis by NASCET criteria. No flow limiting stenosis of the  cervical internal carotid arteries. POSTERIOR  CIRCULATION: Bilateral vertebral arteries are patent to the vertebrobasilar junction. No evidence for atherosclerosis or flow limiting stenosis. Source images and MIP image were reviewed. IMPRESSION: MRI HEAD: Acute nonhemorrhagic LEFT frontoparietal/ACA territory infarcts. Mild chronic small vessel ischemic disease. MRA HEAD: Occluded mid to distal callosomarginal branch of the anterior cerebral artery. Predominately azygos ACA. MRA NECK: 50% stenosis bilateral proximal internal carotid artery origins. Electronically Signed   By: Awilda Metro M.D.   On: 10/13/2016 02:35   Mr Angiogram Neck W Or Wo Contrast  Result Date: 10/13/2016 CLINICAL DATA:  RIGHT-sided weakness for 2 days. Hypertensive, RIGHT-sided chest pain and elevated D-dimer. Confusion. Assess stroke. EXAM: MRI HEAD WITHOUT CONTRAST MRA HEAD WITHOUT CONTRAST MRA NECK WITHOUT AND WITH CONTRAST TECHNIQUE: Multiplanar, multiecho pulse sequences of the brain and surrounding structures were obtained without intravenous contrast. Angiographic images of the Circle of Willis were obtained using MRA technique without intravenous contrast. Angiographic images of the neck were obtained using MRA technique without and with intravenous contrast. Carotid stenosis measurements (when applicable) are obtained utilizing NASCET criteria, using the distal internal carotid diameter as the denominator. CONTRAST:  20mL MULTIHANCE GADOBENATE DIMEGLUMINE 529 MG/ML IV SOLN COMPARISON:  CT HEAD October 12, 2016 FINDINGS: MRI HEAD FINDINGS BRAIN: 2 x 3 cm area reduced diffusion LEFT mesial posterior frontal lobe. subcentimeter foci of reduced diffusion posterior to the dominant area, within LEFT mesial frontal and parietal lobes, with corresponding low ADC values and slight bright FLAIR T2 signal. No susceptibility artifact to suggest hemorrhage. A few scattered subcentimeter supratentorial white matter FLAIR T2 hyperintensities. The ventricles and sulci are normal for  patient's age. No suspicious parenchymal signal, masses or mass effect. No abnormal extra-axial fluid collections. VASCULAR: Normal major intracranial vascular flow voids present at skull base. SKULL AND UPPER CERVICAL SPINE: No abnormal sellar expansion. No suspicious calvarial bone marrow signal. Craniocervical junction maintained. SINUSES/ORBITS: The mastoid air-cells and included paranasal sinuses are well-aerated. The included ocular globes and orbital contents are non-suspicious. OTHER: Subcentimeter lymph nodes bilateral parotid glands. MRA HEAD FINDINGS ANTERIOR CIRCULATION: Normal flow related enhancement of the included cervical, petrous, cavernous and supraclinoid internal carotid arteries. Patent anterior communicating artery. Predominately azygos ACA, occluded branch of the callosum marginal mid to distal segment. Normal flow related enhancement of the middle cerebral arteries, including distal segments. No large vessel occlusion, high-grade stenosis, abnormal luminal irregularity, aneurysm. POSTERIOR CIRCULATION: Codominant vertebral artery's. Basilar artery is patent, with normal flow related enhancement of the main branch vessels. Normal flow related enhancement of the posterior cerebral arteries. No large vessel occlusion, high-grade stenosis, abnormal luminal irregularity, aneurysm. ANATOMIC VARIANTS: None. MRA NECK FINDINGS ANTERIOR CIRCULATION: The common carotid arteries are widely patent bilaterally. The carotid bifurcation are patent bilaterally. Within 5 mm of the bilateral internal carotid artery origins are 2 mm segments of 50% stenosis by NASCET criteria. No flow limiting stenosis of the cervical internal carotid arteries. POSTERIOR CIRCULATION: Bilateral vertebral arteries are patent to the vertebrobasilar junction. No evidence for atherosclerosis or flow limiting stenosis. Source images and MIP image were reviewed. IMPRESSION: MRI HEAD: Acute nonhemorrhagic LEFT frontoparietal/ACA  territory infarcts. Mild chronic small vessel ischemic disease. MRA HEAD: Occluded mid to distal callosomarginal branch of the anterior cerebral artery. Predominately azygos ACA. MRA NECK: 50% stenosis bilateral proximal internal carotid artery origins. Electronically Signed   By: Awilda Metro M.D.   On: 10/13/2016 02:35   Mr Brain Wo Contrast  Result Date: 10/13/2016 CLINICAL DATA:  RIGHT-sided weakness for  2 days. Hypertensive, RIGHT-sided chest pain and elevated D-dimer. Confusion. Assess stroke. EXAM: MRI HEAD WITHOUT CONTRAST MRA HEAD WITHOUT CONTRAST MRA NECK WITHOUT AND WITH CONTRAST TECHNIQUE: Multiplanar, multiecho pulse sequences of the brain and surrounding structures were obtained without intravenous contrast. Angiographic images of the Circle of Willis were obtained using MRA technique without intravenous contrast. Angiographic images of the neck were obtained using MRA technique without and with intravenous contrast. Carotid stenosis measurements (when applicable) are obtained utilizing NASCET criteria, using the distal internal carotid diameter as the denominator. CONTRAST:  20mL MULTIHANCE GADOBENATE DIMEGLUMINE 529 MG/ML IV SOLN COMPARISON:  CT HEAD October 12, 2016 FINDINGS: MRI HEAD FINDINGS BRAIN: 2 x 3 cm area reduced diffusion LEFT mesial posterior frontal lobe. subcentimeter foci of reduced diffusion posterior to the dominant area, within LEFT mesial frontal and parietal lobes, with corresponding low ADC values and slight bright FLAIR T2 signal. No susceptibility artifact to suggest hemorrhage. A few scattered subcentimeter supratentorial white matter FLAIR T2 hyperintensities. The ventricles and sulci are normal for patient's age. No suspicious parenchymal signal, masses or mass effect. No abnormal extra-axial fluid collections. VASCULAR: Normal major intracranial vascular flow voids present at skull base. SKULL AND UPPER CERVICAL SPINE: No abnormal sellar expansion. No suspicious  calvarial bone marrow signal. Craniocervical junction maintained. SINUSES/ORBITS: The mastoid air-cells and included paranasal sinuses are well-aerated. The included ocular globes and orbital contents are non-suspicious. OTHER: Subcentimeter lymph nodes bilateral parotid glands. MRA HEAD FINDINGS ANTERIOR CIRCULATION: Normal flow related enhancement of the included cervical, petrous, cavernous and supraclinoid internal carotid arteries. Patent anterior communicating artery. Predominately azygos ACA, occluded branch of the callosum marginal mid to distal segment. Normal flow related enhancement of the middle cerebral arteries, including distal segments. No large vessel occlusion, high-grade stenosis, abnormal luminal irregularity, aneurysm. POSTERIOR CIRCULATION: Codominant vertebral artery's. Basilar artery is patent, with normal flow related enhancement of the main branch vessels. Normal flow related enhancement of the posterior cerebral arteries. No large vessel occlusion, high-grade stenosis, abnormal luminal irregularity, aneurysm. ANATOMIC VARIANTS: None. MRA NECK FINDINGS ANTERIOR CIRCULATION: The common carotid arteries are widely patent bilaterally. The carotid bifurcation are patent bilaterally. Within 5 mm of the bilateral internal carotid artery origins are 2 mm segments of 50% stenosis by NASCET criteria. No flow limiting stenosis of the cervical internal carotid arteries. POSTERIOR CIRCULATION: Bilateral vertebral arteries are patent to the vertebrobasilar junction. No evidence for atherosclerosis or flow limiting stenosis. Source images and MIP image were reviewed. IMPRESSION: MRI HEAD: Acute nonhemorrhagic LEFT frontoparietal/ACA territory infarcts. Mild chronic small vessel ischemic disease. MRA HEAD: Occluded mid to distal callosomarginal branch of the anterior cerebral artery. Predominately azygos ACA. MRA NECK: 50% stenosis bilateral proximal internal carotid artery origins. Electronically Signed    By: Awilda Metro M.D.   On: 10/13/2016 02:35      Randol Kern, DAWOOD M.D on 10/13/2016 at 11:13 AM  Between 7am to 7pm - Pager - [phone]  After 7pm go to [email or web address] - password [facility]  [facility] -  Office  [phone]

## 2016-10-13 NOTE — Progress Notes (Signed)
Patient admitted from ER. Patient alert and oriented x 4. Patient oriented to room. Tele placed and was verified. Will continue to monitor.

## 2016-10-14 DIAGNOSIS — I429 Cardiomyopathy, unspecified: Secondary | ICD-10-CM

## 2016-10-14 DIAGNOSIS — I5022 Chronic systolic (congestive) heart failure: Secondary | ICD-10-CM

## 2016-10-14 DIAGNOSIS — I6522 Occlusion and stenosis of left carotid artery: Secondary | ICD-10-CM

## 2016-10-14 DIAGNOSIS — I6523 Occlusion and stenosis of bilateral carotid arteries: Secondary | ICD-10-CM

## 2016-10-14 LAB — CBC
HCT: 44.3 % (ref 39.0–52.0)
Hemoglobin: 14.4 g/dL (ref 13.0–17.0)
MCH: 30.1 pg (ref 26.0–34.0)
MCHC: 32.5 g/dL (ref 30.0–36.0)
MCV: 92.7 fL (ref 78.0–100.0)
Platelets: 140 10*3/uL — ABNORMAL LOW (ref 150–400)
RBC: 4.78 MIL/uL (ref 4.22–5.81)
RDW: 13.4 % (ref 11.5–15.5)
WBC: 7.6 10*3/uL (ref 4.0–10.5)

## 2016-10-14 LAB — BASIC METABOLIC PANEL
Anion gap: 12 (ref 5–15)
BUN: 9 mg/dL (ref 6–20)
CO2: 22 mmol/L (ref 22–32)
Calcium: 9 mg/dL (ref 8.9–10.3)
Chloride: 105 mmol/L (ref 101–111)
Creatinine, Ser: 1.02 mg/dL (ref 0.61–1.24)
GFR calc Af Amer: >60 mL/min (ref >60)
GFR calc non Af Amer: >60 mL/min (ref >60)
Glucose, Bld: 137 mg/dL — ABNORMAL HIGH (ref 65–99)
Potassium: 4 mmol/L (ref 3.5–5.1)
Sodium: 139 mmol/L (ref 135–145)

## 2016-10-14 LAB — HEMOGLOBIN A1C
Hgb A1c MFr Bld: 6.4 % — ABNORMAL HIGH (ref 4.8–5.6)
Mean Plasma Glucose: 137 mg/dL

## 2016-10-14 MED ORDER — CARVEDILOL 3.125 MG PO TABS
3.1250 mg | ORAL_TABLET | Freq: Two times a day (BID) | ORAL | Status: DC
  Administered 2016-10-14 – 2016-10-15 (×3): 3.125 mg via ORAL
  Filled 2016-10-14 (×3): qty 1

## 2016-10-14 MED ORDER — LOSARTAN POTASSIUM 50 MG PO TABS
25.0000 mg | ORAL_TABLET | Freq: Every day | ORAL | Status: DC
  Administered 2016-10-15: 25 mg via ORAL
  Filled 2016-10-14: qty 1

## 2016-10-14 NOTE — Evaluation (Signed)
Occupational Therapy Evaluation/Discharge Patient Details Name: Jason Melendez MRN: [id number] DOB: May 02, 1956 Today's Date: 10/14/2016    History of Present Illness Todd Jelinski is a 61 y.o. male with medical history significant of HTN; who presented with a three-day history of intermittent right-sided weakness.   Clinical Impression   PTA, pt was independent with ADL and functional mobility and he works full time as a Public affairs consultant. Pt currently performing ADL independently in hospital setting. Able to complete fine motor tasks independently with no errors. Additionally, he reports he is at baseline with functional mobility. Provided education to pt and family concerning energy conservation and stress reduction strategies in order to improve safety with ADL and IADL post-acute D/C. They report and demonstrate understanding and have no further questions or concerns. No further acute OT needs identified. OT will sign off.    Follow Up Recommendations  No OT follow up;Supervision - Intermittent    Equipment Recommendations  None recommended by OT    Recommendations for Other Services       Precautions / Restrictions Precautions Precaution Comments: watch BP Restrictions Weight Bearing Restrictions: No      Mobility Bed Mobility               General bed mobility comments: OOB in chair on OT arrival.  Transfers Overall transfer level: Independent Equipment used: None Transfers: Sit to/from Stand Sit to Stand: Independent              Balance Overall balance assessment: Independent                                          ADL Overall ADL's : Independent;At baseline                                       General ADL Comments: Able to complete all ADL independently in hospital room. All weakness, coordination, and numbness have resolved and pt able to complete fine motor tasks related to ADL with no difficulties.      Vision Vision Assessment?: No apparent visual deficits Additional Comments: Pt able to use central and peripheral vision during ADL with no difficulties. Reports no blurred or double vision.   Perception     Praxis Praxis Praxis tested?: Within functional limits    Pertinent Vitals/Pain Pain Assessment: No/denies pain     Hand Dominance Right   Extremity/Trunk Assessment Upper Extremity Assessment Upper Extremity Assessment: Overall WFL for tasks assessed (All coordination and sensation in tact for ADL and work task)   Lower Extremity Assessment Lower Extremity Assessment: Overall WFL for tasks assessed       Communication Communication Communication: No difficulties   Cognition Arousal/Alertness: Awake/alert Behavior During Therapy: WFL for tasks assessed/performed Overall Cognitive Status: Within Functional Limits for tasks assessed                     General Comments       Exercises       Shoulder Instructions      Home Living Family/patient expects to be discharged to:: Private residence Living Arrangements: Spouse/significant other Available Help at Discharge: Family Type of Home: House Home Access: Stairs to enter [employer] of Steps: 4 Entrance Stairs-Rails: Right;Left Home Layout: One level     Bathroom Shower/Tub:  Walk-in shower   Bathroom Toilet: Standard     Home Equipment: None          Prior Functioning/Environment Level of Independence: Independent        Comments: works as Teacher, adult educationphoto refinisher        OT Problem List: Decreased activity tolerance   OT Treatment/Interventions:      OT Goals(Current goals can be found in the care plan section) Acute Rehab OT Goals Patient Stated Goal: To go home and reduce stress. OT Goal Formulation: With patient/family Time For Goal Achievement: 10/21/16 Potential to Achieve Goals: Good  OT Frequency:     Barriers to D/C:            Co-evaluation               End of Session    Activity Tolerance: Patient tolerated treatment well Patient left: in chair;with call bell/phone within reach;with family/visitor present   Time: [id number] OT Time Calculation (min): 27 min Charges:  OT General Charges $OT Visit: 1 Procedure OT Evaluation $OT Eval Moderate Complexity: 1 Procedure OT Treatments $Self Care/Home Management : 8-22 mins G-Codes:    Doristine Sectionharity A Pattye Meda, MS OTR/L  Pager: [phone]  Meta Kroenke A Amro Winebarger 10/14/2016, 9:22 AM

## 2016-10-14 NOTE — Consult Note (Addendum)
The patient has been seen in conjunction with Berton Bon, NP. All aspects of care have been considered and discussed. The patient has been personally interviewed, examined, and all clinical data has been reviewed.   Agree with findings and recommendations as outlined.  The patient should be started on guideline mandated therapy for systolic heart failure including the addition of low-dose ACE or ARB therapy (will start losartan in AM if BP allows). Care to avoid hypotension in this early setting post stroke is very important. Optimization of heart failure therapy can be achieved as an outpatient.  Agree that the most likely etiology of the patient's systolic dysfunction is hypertension, previously untreated. However, underlying coronary artery disease/ischemic cardiomyopathy needs to be excluded with noninvasive nuclear perfusion imaging. This could be done as an outpatient but if vascular surgery on left carotid planned for Friday, need to exclude significant CAD prior to then. Therefore, plan Lexiscan Myoview for AM.  A 30 day continuous ambulatory monitor and TEE may be needed if etiology felt embolic.  We will follow.    Cardiology Consult    Patient ID: Jason Melendez MRN: [id number], DOB/AGE: [birth year]   Admit date: 10/12/2016 Date of Consult: 10/14/2016  Primary Physician: No PCP Per Patient Reason for Consult: Abnormal echo Primary Cardiologist: New Requesting Provider: Dr. Randol Kern   History of Present Illness    Nasif Bos is a 61 y.o. male with past medical history significant for hypertension who presented with a three day history of intermittent right sided weakness. An MRI of the brain showed a left frontoparietal acute CVA. We have been asked to consult for abnormal echocardiogram.   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 Surgery will  be considered if the left carotid stenosis is felt to be the source of the stroke.  The patient denies now or prior to this event any chest pain/pressure, orthopnea, PND. He does have mild DOE with walking up the stairs at work. He sleeps in a recliner due to inner ear problems. He has bilateral lower extremity edema R>L that he noticed about 2-3 weeks ago. D-dimer was positive but lower extremity dopplers were negative for DVT.   He has no history of cardiac events. His father had a CABG at age 80.   Past Medical History   Past Medical History:  Diagnosis Date  . HTN (hypertension)     Past Surgical History:  Procedure Laterality Date  . HERNIA REPAIR       Allergies  No Known Allergies  Inpatient Medications    . aspirin  325 mg Oral Daily  . atorvastatin  40 mg Oral q1800  . carvedilol  3.125 mg Oral BID WC  . enoxaparin (LOVENOX) injection  40 mg Subcutaneous Q24H    Family History    Family History  Problem Relation Age of Onset  . Diabetes Mother   . Heart disease Father 12    CABG  . Diabetes Father   . Kidney disease Father   . Kidney failure Father   . Hypertension Sister     Social History    Social History   Social History  . Marital status: Married    Spouse name: N/A  . Number of children: N/A  . Years of education: N/A   Occupational History  . Not on file.   Social History Main Topics  . Smoking status: Never  Smoker  . Smokeless tobacco: Never Used  . Alcohol use No  . Drug use: No  . Sexual activity: Not Currently   Other Topics Concern  . Not on file   Social History Narrative   Marital status: married      Children: 2 daughters      Lives:  With wife      Employment: printing work      Tobacco: none      Alcohol: one beer every three months      Drugs:  None      Exercise:  No formal exercise; walks up three flights of stairs several times per day at work.     Review of Systems   General:  No chills, fever, night sweats  or weight changes.  Cardiovascular:  No chest pain, dyspnea on exertion, edema, orthopnea, palpitations, paroxysmal nocturnal dyspnea. Dermatological: No rash, lesions/masses Respiratory: No cough, dyspnea Urologic: No hematuria, dysuria Abdominal:   No nausea, vomiting, diarrhea, bright red blood per rectum, melena, or hematemesis Neurologic:  No visual changes, wkns, changes in mental status. All other systems reviewed and are otherwise negative except as noted above.  Physical Exam   Blood pressure (!) 164/82, pulse 84, temperature 98.3 F (36.8 C), temperature source Oral, resp. rate 18, height 6' (1.829 m), weight 266 lb (120.7 kg), SpO2 95 %.  General: Pleasant, NAD Psych: Normal affect. Neuro: Alert and oriented X 3. Moves all extremities spontaneously. HEENT: Normal  Neck: Supple without bruits or JVD. Lungs:  Resp regular and unlabored, CTA. Heart: RRR no s3, s4, or murmurs. Abdomen: Soft, non-tender, non-distended, BS + x 4.  Extremities: No clubbing, cyanosis Trace lower leg edema, R>L.  DP/PT/Radials 2+ and equal bilaterally.  Labs    Troponin [facility] Care Test)  Recent Labs  10/12/16 2044  TROPIPOC 0.02   No results for input(s): CKTOTAL, CKMB, TROPONINI in the last 72 hours. Lab Results  Component Value Date   WBC 7.6 10/14/2016   HGB 14.4 10/14/2016   HCT 44.3 10/14/2016   MCV 92.7 10/14/2016   PLT 140 (L) 10/14/2016    Recent Labs Lab 10/12/16 2322  10/14/16 0553  NA  --   < > 139  K  --   < > 4.0  CL  --   < > 105  CO2  --   < > 22  BUN  --   < > 9  CREATININE  --   < > 1.02  CALCIUM  --   < > 9.0  PROT 6.0*  --   --   BILITOT 0.9  --   --   ALKPHOS 34*  --   --   ALT 18  --   --   AST 34  --   --   GLUCOSE  --   < > 137*  < > = values in this interval not displayed. Lab Results  Component Value Date   CHOL 176 10/13/2016   HDL 26 (L) 10/13/2016   LDLCALC 109 (H) 10/13/2016   TRIG 206 (H) 10/13/2016   Lab Results  Component Value  Date   DDIMER 0.89 (H) 10/12/2016     Radiology Studies    Dg Chest 2 View  Result Date: 10/12/2016 CLINICAL DATA:  Initial evaluation for acute right-sided chest pain. EXAM: CHEST  2 VIEW COMPARISON:  None. FINDINGS: The cardiac and mediastinal silhouettes are within normal limits. The lungs are normally inflated. No airspace consolidation, pleural effusion,  or pulmonary edema is identified. There is no pneumothorax. No acute osseous abnormality identified. IMPRESSION: No active cardiopulmonary disease. Electronically Signed   By: Rise Mu M.D.   On: 10/12/2016 21:33   Ct Head Wo Contrast  Result Date: 10/12/2016 CLINICAL DATA:  Right leg and arm weakness.  Hypertension. EXAM: CT HEAD WITHOUT CONTRAST TECHNIQUE: Contiguous axial images were obtained from the base of the skull through the vertex without intravenous contrast. COMPARISON:  None. FINDINGS: Brain: No mass lesion, intraparenchymal hemorrhage or extra-axial collection. No evidence of acute cortical infarct. Brain parenchyma and CSF-containing spaces are normal for age. Left parafalcine arachnoid cyst. Vascular: No hyperdense vessel or unexpected calcification. Skull: Normal visualized skull base, calvarium and extracranial soft tissues. Sinuses/Orbits: No sinus fluid levels or advanced mucosal thickening. No mastoid effusion. Normal orbits. IMPRESSION: No acute intracranial abnormality. Electronically Signed   By: Deatra Robinson M.D.   On: 10/12/2016 21:12   Ct Angio Neck W Or Wo Contrast  Result Date: 10/13/2016 CLINICAL DATA:  61 year old male with left ACA infarct discovered during evaluation of confusion, right side weakness for 2 days. Initial encounter. EXAM: CT ANGIOGRAPHY NECK TECHNIQUE: Multidetector CT imaging of the neck was performed using the standard protocol during bolus administration of intravenous contrast. Multiplanar CT image reconstructions and MIPs were obtained to evaluate the vascular anatomy. Carotid  stenosis measurements (when applicable) are obtained utilizing NASCET criteria, using the distal internal carotid diameter as the denominator. CONTRAST:  50 mL Isovue 370 COMPARISON:  Brain MRI and MRA head and neck 0050 hours today. FINDINGS: Skeleton: Carious posterior dentition. No acute osseous abnormality identified. Visualized paranasal sinuses and mastoids are stable and well pneumatized. Upper chest: Superior mediastinal lipomatosis. No superior mediastinal lymphadenopathy. Negative visualized lung parenchyma. Other neck: Negative thyroid, larynx, pharynx, parapharyngeal spaces, retropharyngeal space, sublingual space, submandibular glands and parotid glands. No cervical lymphadenopathy. Aortic arch: 3 vessel arch configuration. Mild distal arch calcified atherosclerosis. Right carotid system: No brachiocephalic artery or right CCA origin stenosis. Mildly tortuous proximal right CCA. Soft plaque along the anterior right CCA without stenosis (series 401, image 74). Soft and calcified plaque at the right carotid bifurcation with no proximal right ICA stenosis (series 404, image 93). Severely tortuous mid cervical right ICA. Otherwise negative right ICA to the level of the distal petrous segment. Left carotid system: No left CCA origin stenosis. Tortuous proximal left CCA. Minimal soft plaque proximal to the left carotid bifurcation. At the left carotid bifurcation there is low-density soft and calcified plaque affecting the left ICA origin and bulb. Subsequent stenosis is up to 50 % with respect to the distal vessel in the proximal left ICA. Mildly tortuous more distal cervical left ICA. Visible left siphon is patent with mild calcified plaque. Vertebral arteries:No proximal right subclavian artery stenosis. Right vertebral artery origin appears normal. There is a late entry of the right vertebral artery into the cervical transverse foramen. The right vertebral artery is mildly dominant. No right vertebral  artery stenosis to the vertebrobasilar junction. Visible basilar artery is unremarkable. No proximal left subclavian artery stenosis despite mild plaque. The left vertebral artery origin is within normal limits. Tortuous left V1 segment. The left vertebral artery is mildly non dominant and patent to the vertebrobasilar junction without stenosis. Normal left PICA origin. IMPRESSION: 1. Soft and calcified bilateral carotid atherosclerosis in the neck with no hemodynamically significant stenosis; 50% proximal left ICA stenosis. 2. Dominant right vertebral artery with no definite vertebral artery stenosis. 3. Carious posterior dentition. Electronically  Signed   By: Odessa Fleming M.D.   On: 10/13/2016 13:01   Mr Maxine Glenn Head Wo Contrast  Result Date: 10/13/2016 CLINICAL DATA:  RIGHT-sided weakness for 2 days. Hypertensive, RIGHT-sided chest pain and elevated D-dimer. Confusion. Assess stroke. EXAM: MRI HEAD WITHOUT CONTRAST MRA HEAD WITHOUT CONTRAST MRA NECK WITHOUT AND WITH CONTRAST TECHNIQUE: Multiplanar, multiecho pulse sequences of the brain and surrounding structures were obtained without intravenous contrast. Angiographic images of the Circle of Willis were obtained using MRA technique without intravenous contrast. Angiographic images of the neck were obtained using MRA technique without and with intravenous contrast. Carotid stenosis measurements (when applicable) are obtained utilizing NASCET criteria, using the distal internal carotid diameter as the denominator. CONTRAST:  20mL MULTIHANCE GADOBENATE DIMEGLUMINE 529 MG/ML IV SOLN COMPARISON:  CT HEAD October 12, 2016 FINDINGS: MRI HEAD FINDINGS BRAIN: 2 x 3 cm area reduced diffusion LEFT mesial posterior frontal lobe. subcentimeter foci of reduced diffusion posterior to the dominant area, within LEFT mesial frontal and parietal lobes, with corresponding low ADC values and slight bright FLAIR T2 signal. No susceptibility artifact to suggest hemorrhage. A few scattered  subcentimeter supratentorial white matter FLAIR T2 hyperintensities. The ventricles and sulci are normal for patient's age. No suspicious parenchymal signal, masses or mass effect. No abnormal extra-axial fluid collections. VASCULAR: Normal major intracranial vascular flow voids present at skull base. SKULL AND UPPER CERVICAL SPINE: No abnormal sellar expansion. No suspicious calvarial bone marrow signal. Craniocervical junction maintained. SINUSES/ORBITS: The mastoid air-cells and included paranasal sinuses are well-aerated. The included ocular globes and orbital contents are non-suspicious. OTHER: Subcentimeter lymph nodes bilateral parotid glands. MRA HEAD FINDINGS ANTERIOR CIRCULATION: Normal flow related enhancement of the included cervical, petrous, cavernous and supraclinoid internal carotid arteries. Patent anterior communicating artery. Predominately azygos ACA, occluded branch of the callosum marginal mid to distal segment. Normal flow related enhancement of the middle cerebral arteries, including distal segments. No large vessel occlusion, high-grade stenosis, abnormal luminal irregularity, aneurysm. POSTERIOR CIRCULATION: Codominant vertebral artery's. Basilar artery is patent, with normal flow related enhancement of the main branch vessels. Normal flow related enhancement of the posterior cerebral arteries. No large vessel occlusion, high-grade stenosis, abnormal luminal irregularity, aneurysm. ANATOMIC VARIANTS: None. MRA NECK FINDINGS ANTERIOR CIRCULATION: The common carotid arteries are widely patent bilaterally. The carotid bifurcation are patent bilaterally. Within 5 mm of the bilateral internal carotid artery origins are 2 mm segments of 50% stenosis by NASCET criteria. No flow limiting stenosis of the cervical internal carotid arteries. POSTERIOR CIRCULATION: Bilateral vertebral arteries are patent to the vertebrobasilar junction. No evidence for atherosclerosis or flow limiting stenosis. Source  images and MIP image were reviewed. IMPRESSION: MRI HEAD: Acute nonhemorrhagic LEFT frontoparietal/ACA territory infarcts. Mild chronic small vessel ischemic disease. MRA HEAD: Occluded mid to distal callosomarginal branch of the anterior cerebral artery. Predominately azygos ACA. MRA NECK: 50% stenosis bilateral proximal internal carotid artery origins. Electronically Signed   By: Awilda Metro M.D.   On: 10/13/2016 02:35   Mr Angiogram Neck W Or Wo Contrast  Result Date: 10/13/2016 CLINICAL DATA:  RIGHT-sided weakness for 2 days. Hypertensive, RIGHT-sided chest pain and elevated D-dimer. Confusion. Assess stroke. EXAM: MRI HEAD WITHOUT CONTRAST MRA HEAD WITHOUT CONTRAST MRA NECK WITHOUT AND WITH CONTRAST TECHNIQUE: Multiplanar, multiecho pulse sequences of the brain and surrounding structures were obtained without intravenous contrast. Angiographic images of the Circle of Willis were obtained using MRA technique without intravenous contrast. Angiographic images of the neck were obtained using MRA technique without and with intravenous  contrast. Carotid stenosis measurements (when applicable) are obtained utilizing NASCET criteria, using the distal internal carotid diameter as the denominator. CONTRAST:  20mL MULTIHANCE GADOBENATE DIMEGLUMINE 529 MG/ML IV SOLN COMPARISON:  CT HEAD October 12, 2016 FINDINGS: MRI HEAD FINDINGS BRAIN: 2 x 3 cm area reduced diffusion LEFT mesial posterior frontal lobe. subcentimeter foci of reduced diffusion posterior to the dominant area, within LEFT mesial frontal and parietal lobes, with corresponding low ADC values and slight bright FLAIR T2 signal. No susceptibility artifact to suggest hemorrhage. A few scattered subcentimeter supratentorial white matter FLAIR T2 hyperintensities. The ventricles and sulci are normal for patient's age. No suspicious parenchymal signal, masses or mass effect. No abnormal extra-axial fluid collections. VASCULAR: Normal major intracranial  vascular flow voids present at skull base. SKULL AND UPPER CERVICAL SPINE: No abnormal sellar expansion. No suspicious calvarial bone marrow signal. Craniocervical junction maintained. SINUSES/ORBITS: The mastoid air-cells and included paranasal sinuses are well-aerated. The included ocular globes and orbital contents are non-suspicious. OTHER: Subcentimeter lymph nodes bilateral parotid glands. MRA HEAD FINDINGS ANTERIOR CIRCULATION: Normal flow related enhancement of the included cervical, petrous, cavernous and supraclinoid internal carotid arteries. Patent anterior communicating artery. Predominately azygos ACA, occluded branch of the callosum marginal mid to distal segment. Normal flow related enhancement of the middle cerebral arteries, including distal segments. No large vessel occlusion, high-grade stenosis, abnormal luminal irregularity, aneurysm. POSTERIOR CIRCULATION: Codominant vertebral artery's. Basilar artery is patent, with normal flow related enhancement of the main branch vessels. Normal flow related enhancement of the posterior cerebral arteries. No large vessel occlusion, high-grade stenosis, abnormal luminal irregularity, aneurysm. ANATOMIC VARIANTS: None. MRA NECK FINDINGS ANTERIOR CIRCULATION: The common carotid arteries are widely patent bilaterally. The carotid bifurcation are patent bilaterally. Within 5 mm of the bilateral internal carotid artery origins are 2 mm segments of 50% stenosis by NASCET criteria. No flow limiting stenosis of the cervical internal carotid arteries. POSTERIOR CIRCULATION: Bilateral vertebral arteries are patent to the vertebrobasilar junction. No evidence for atherosclerosis or flow limiting stenosis. Source images and MIP image were reviewed. IMPRESSION: MRI HEAD: Acute nonhemorrhagic LEFT frontoparietal/ACA territory infarcts. Mild chronic small vessel ischemic disease. MRA HEAD: Occluded mid to distal callosomarginal branch of the anterior cerebral artery.  Predominately azygos ACA. MRA NECK: 50% stenosis bilateral proximal internal carotid artery origins. Electronically Signed   By: Awilda Metro M.D.   On: 10/13/2016 02:35   Mr Brain Wo Contrast  Result Date: 10/13/2016 CLINICAL DATA:  RIGHT-sided weakness for 2 days. Hypertensive, RIGHT-sided chest pain and elevated D-dimer. Confusion. Assess stroke. EXAM: MRI HEAD WITHOUT CONTRAST MRA HEAD WITHOUT CONTRAST MRA NECK WITHOUT AND WITH CONTRAST TECHNIQUE: Multiplanar, multiecho pulse sequences of the brain and surrounding structures were obtained without intravenous contrast. Angiographic images of the Circle of Willis were obtained using MRA technique without intravenous contrast. Angiographic images of the neck were obtained using MRA technique without and with intravenous contrast. Carotid stenosis measurements (when applicable) are obtained utilizing NASCET criteria, using the distal internal carotid diameter as the denominator. CONTRAST:  20mL MULTIHANCE GADOBENATE DIMEGLUMINE 529 MG/ML IV SOLN COMPARISON:  CT HEAD October 12, 2016 FINDINGS: MRI HEAD FINDINGS BRAIN: 2 x 3 cm area reduced diffusion LEFT mesial posterior frontal lobe. subcentimeter foci of reduced diffusion posterior to the dominant area, within LEFT mesial frontal and parietal lobes, with corresponding low ADC values and slight bright FLAIR T2 signal. No susceptibility artifact to suggest hemorrhage. A few scattered subcentimeter supratentorial white matter FLAIR T2 hyperintensities. The ventricles and sulci are normal for  patient's age. No suspicious parenchymal signal, masses or mass effect. No abnormal extra-axial fluid collections. VASCULAR: Normal major intracranial vascular flow voids present at skull base. SKULL AND UPPER CERVICAL SPINE: No abnormal sellar expansion. No suspicious calvarial bone marrow signal. Craniocervical junction maintained. SINUSES/ORBITS: The mastoid air-cells and included paranasal sinuses are well-aerated.  The included ocular globes and orbital contents are non-suspicious. OTHER: Subcentimeter lymph nodes bilateral parotid glands. MRA HEAD FINDINGS ANTERIOR CIRCULATION: Normal flow related enhancement of the included cervical, petrous, cavernous and supraclinoid internal carotid arteries. Patent anterior communicating artery. Predominately azygos ACA, occluded branch of the callosum marginal mid to distal segment. Normal flow related enhancement of the middle cerebral arteries, including distal segments. No large vessel occlusion, high-grade stenosis, abnormal luminal irregularity, aneurysm. POSTERIOR CIRCULATION: Codominant vertebral artery's. Basilar artery is patent, with normal flow related enhancement of the main branch vessels. Normal flow related enhancement of the posterior cerebral arteries. No large vessel occlusion, high-grade stenosis, abnormal luminal irregularity, aneurysm. ANATOMIC VARIANTS: None. MRA NECK FINDINGS ANTERIOR CIRCULATION: The common carotid arteries are widely patent bilaterally. The carotid bifurcation are patent bilaterally. Within 5 mm of the bilateral internal carotid artery origins are 2 mm segments of 50% stenosis by NASCET criteria. No flow limiting stenosis of the cervical internal carotid arteries. POSTERIOR CIRCULATION: Bilateral vertebral arteries are patent to the vertebrobasilar junction. No evidence for atherosclerosis or flow limiting stenosis. Source images and MIP image were reviewed. IMPRESSION: MRI HEAD: Acute nonhemorrhagic LEFT frontoparietal/ACA territory infarcts. Mild chronic small vessel ischemic disease. MRA HEAD: Occluded mid to distal callosomarginal branch of the anterior cerebral artery. Predominately azygos ACA. MRA NECK: 50% stenosis bilateral proximal internal carotid artery origins. Electronically Signed   By: Awilda Metroourtnay  Bloomer M.D.   On: 10/13/2016 02:35    EKG & Cardiac Imaging   EKG: NSR at 92 bpm with non-specific T wave abnormalities in lead III  and AVF  Echocardiogram:  10/13/16 Study Conclusions  - Left ventricle: The cavity size was mildly dilated. There was   mild focal basal hypertrophy of the septum. Systolic function was   mildly to moderately reduced. The estimated ejection fraction was   in the range of 40% to 45%. Diffuse hypokinesis. There was an   increased relative contribution of atrial contraction to   ventricular filling. Doppler parameters are consistent with   abnormal left ventricular relaxation (grade 1 diastolic   dysfunction). - Aortic valve: Trileaflet; mildly thickened, mildly calcified   leaflets. - Left atrium: The atrium was mildly dilated. - Pulmonary arteries: Systolic pressure could not be accurately   estimated.   Assessment & Plan    Chronic systolic LV dysfunction -EF 40-45% with mildly dilated LV and LA. Mild focal basal hypertrophy of uncertain origin and significance -Likely hypertensive heart disease. BP elevated on presentation as high as 220/110 in ED. BP at office visit 07/2015 164/95. BP office visit 10/29/2015 128/77. -No antihypertensive therapy PTA. -Continue carvedilol -Pt would benefit from adding an ACE-I and possibly spironolactone when considering BP lowering -Consider ischemic workup, probably can be done as outpatient nuclear stress test for risk startification.  CVA -Acute nonhemorrhagic LEFT frontoparietal/ACA territory Infarcts. Mild chronic small vessel ischemic disease. MRA HEAD: Occluded mid to distal callosomarginal branch of the anterior cerebral artery. Predominately azygos ACA. MRA NECK: 50% stenosis bilateral proximal internal carotid artery origins. -Neurology consulted. Vascular surgery consulted for carotid artery stenosis -On full dose aspirin  Hypertension -blood pressure elevated on presentation. Hydralazine IV given with subsequent bradycardia and hypotension with  worsening of the CVA symptoms indicating possible embolic source of CVA -Permissive  hypertension in setting of CVA, low dose Coreg, goal of obtaining BP control over the next 5 days per IM -Consider addition of ACE-I, then spironolactone as needed for cardiac function when permissible to low BP.  Hyperlipidemia -LDL on 10/13/16 109. Continue lipitor 40 mg   Oletta Cohn, NP-C 10/14/2016, 1:43 PM Pager: [phone]

## 2016-10-14 NOTE — Progress Notes (Signed)
PROGRESS NOTE                                                                                                                                                                                                             Patient Demographics:    Jason Melendez, is a 61 y.o. male, DOB - [birth year], JXB:[id number]  Admit date - 10/12/2016   Admitting Physician Clydie Braun, MD  Outpatient Primary MD for the patient is No PCP Per Patient  LOS - 1   No chief complaint on file.      Brief Narrative  61 year old male with history of hypertension, presents with right-sided weakness, workup significant for acute left frontoparietal CVA.   Subjective:    Jason Melendez today has, No headache, No chest pain, No abdominal pain - Denies any further deficits.  Assessment  & Plan :    Principal Problem:   CVA (cerebral vascular accident) (HCC) Active Problems:   HTN (hypertension)   HLD (hyperlipidemia)   Obesity (BMI 30.0-34.9)  Acute CVA  - MRI brain significant for left frontoparietal acute CVA . - MRA head with Occluded mid to distal callosomarginal branch of th eanterior cerebral artery. Predominately azygos ACA. - MRA NECK: 50% stenosis bilateral proximal internal carotid artery - LDL is 109, started on pravastatin - CTA of neck with 50% left ICA/CCA stenosis with calcification and soft plaques, vascular surgery consulted per neuro recommendation, may need left CEA source is  left carotid  stenosis -2-D echo with EF 40-45% with diffuse hypokinesis, cardiology consulted for further evaluation -Doppler with no evidence of DVT - Continue with full dose aspirin  Hypertension - Allow for permissive hypertension, will start on low-dose Coreg especially with EF 40-45%, with goal to control blood pressure within 5 days  Hyperlipidemia - Patient reports he stopped taking statin at home, resumed on atorvastatin, LDL of 109  Code Status : Full   Family  Communication  : Wife and daughter at bedside   Disposition Plan  : Pending PT evaluation   Consults  :  neurology  Procedures  : none  DVT Prophylaxis  :  Lovenox   Lab Results  Component Value Date   PLT 140 (L) 10/14/2016    Antibiotics  :    Anti-infectives    None  Objective:   Vitals:   10/14/16 0120 10/14/16 0520 10/14/16 0802 10/14/16 0929  BP: (!) 171/89 (!) 177/93 (!) 187/98 (!) 164/82  Pulse: 76 77 84 84  Resp: 18 18    Temp: 98.3 F (36.8 C) 98.3 F (36.8 C)    TempSrc: Oral Oral    SpO2: 95% 95% 97% 95%  Weight:      Height:        Wt Readings from Last 3 Encounters:  10/13/16 120.7 kg (266 lb)  10/12/16 122.5 kg (270 lb)  10/29/15 109.8 kg (242 lb)     Intake/Output Summary (Last 24 hours) at 10/14/16 1158 Last data filed at 10/14/16 6045  Gross per 24 hour  Intake           1967.5 ml  Output              350 ml  Net           1617.5 ml     Physical Exam  Awake Alert, Oriented X 3, No new F.N deficits, Normal affect [place].AT,PERRAL Supple Neck,No JVD, Symmetrical Chest wall movement, Good air movement bilaterally, CTAB RRR,No Gallops,Rubs or new Murmurs, No Parasternal Heave +ve B.Sounds, Abd Soft, No tenderness,No rebound - guarding or rigidity. No Cyanosis, Clubbing or edema,    Data Review:    CBC  Recent Labs Lab 10/12/16 1949 10/13/16 0217 10/14/16 0553  WBC 6.4 8.0 7.6  HGB 14.8 14.1 14.4  HCT 44.5 42.4 44.3  PLT 145* 137* 140*  MCV 91.2 91.8 92.7  MCH 30.3 30.5 30.1  MCHC 33.3 33.3 32.5  RDW 13.1 13.1 13.4  LYMPHSABS 2.4 2.1  --   MONOABS 0.4 0.4  --   EOSABS 0.1 0.1  --   BASOSABS 0.0 0.0  --     Chemistries   Recent Labs Lab 10/12/16 1949 10/12/16 2322 10/13/16 0217 10/14/16 0553  NA 141  --  140 139  K 3.9  --  3.8 4.0  CL 106  --  105 105  CO2 25  --  24 22  GLUCOSE 117*  --  153* 137*  BUN 12  --  10 9  CREATININE 1.01  --  1.05 1.02  CALCIUM 9.5  --  9.1 9.0  AST  --  34  --   --     ALT  --  18  --   --   ALKPHOS  --  34*  --   --   BILITOT  --  0.9  --   --    ------------------------------------------------------------------------------------------------------------------  Recent Labs  10/13/16 0217  CHOL 176  HDL 26*  LDLCALC 109*  TRIG 206*  CHOLHDL 6.8    Lab Results  Component Value Date   HGBA1C 6.4 (H) 10/13/2016   ------------------------------------------------------------------------------------------------------------------ No results for input(s): TSH, T4TOTAL, T3FREE, THYROIDAB in the last 72 hours.  Invalid input(s): FREET3 ------------------------------------------------------------------------------------------------------------------ No results for input(s): VITAMINB12, FOLATE, FERRITIN, TIBC, IRON, RETICCTPCT in the last 72 hours.  Coagulation profile  Recent Labs Lab 10/12/16 2058  INR 1.05     Recent Labs  10/12/16 2058  DDIMER 0.89*    Cardiac Enzymes No results for input(s): CKMB, TROPONINI, MYOGLOBIN in the last 168 hours.  Invalid input(s): CK ------------------------------------------------------------------------------------------------------------------    Component Value Date/Time   BNP 27.0 10/12/2016 1949    Inpatient Medications  Scheduled Meds: . aspirin  325 mg Oral Daily  . atorvastatin  40 mg Oral q1800  . carvedilol  3.125 mg Oral BID WC  . enoxaparin (LOVENOX) injection  40 mg Subcutaneous Q24H   Continuous Infusions: . sodium chloride 20 mL/hr at 10/12/16 2111  . sodium chloride 75 mL/hr at 10/14/16 0517   PRN Meds:.acetaminophen **OR** acetaminophen (TYLENOL) oral liquid 160 mg/5 mL **OR** acetaminophen, senna-docusate  Micro Results No results found for this or any previous visit (from the past 240 hour(s)).  Radiology Reports Dg Chest 2 View  Result Date: 10/12/2016 CLINICAL DATA:  Initial evaluation for acute right-sided chest pain. EXAM: CHEST  2 VIEW COMPARISON:  None.  FINDINGS: The cardiac and mediastinal silhouettes are within normal limits. The lungs are normally inflated. No airspace consolidation, pleural effusion, or pulmonary edema is identified. There is no pneumothorax. No acute osseous abnormality identified. IMPRESSION: No active cardiopulmonary disease. Electronically Signed   By: Rise Mu M.D.   On: 10/12/2016 21:33   Ct Head Wo Contrast  Result Date: 10/12/2016 CLINICAL DATA:  Right leg and arm weakness.  Hypertension. EXAM: CT HEAD WITHOUT CONTRAST TECHNIQUE: Contiguous axial images were obtained from the base of the skull through the vertex without intravenous contrast. COMPARISON:  None. FINDINGS: Brain: No mass lesion, intraparenchymal hemorrhage or extra-axial collection. No evidence of acute cortical infarct. Brain parenchyma and CSF-containing spaces are normal for age. Left parafalcine arachnoid cyst. Vascular: No hyperdense vessel or unexpected calcification. Skull: Normal visualized skull base, calvarium and extracranial soft tissues. Sinuses/Orbits: No sinus fluid levels or advanced mucosal thickening. No mastoid effusion. Normal orbits. IMPRESSION: No acute intracranial abnormality. Electronically Signed   By: Deatra Robinson M.D.   On: 10/12/2016 21:12   Ct Angio Neck W Or Wo Contrast  Result Date: 10/13/2016 CLINICAL DATA:  61 year old male with left ACA infarct discovered during evaluation of confusion, right side weakness for 2 days. Initial encounter. EXAM: CT ANGIOGRAPHY NECK TECHNIQUE: Multidetector CT imaging of the neck was performed using the standard protocol during bolus administration of intravenous contrast. Multiplanar CT image reconstructions and MIPs were obtained to evaluate the vascular anatomy. Carotid stenosis measurements (when applicable) are obtained utilizing NASCET criteria, using the distal internal carotid diameter as the denominator. CONTRAST:  50 mL Isovue 370 COMPARISON:  Brain MRI and MRA head and neck  0050 hours today. FINDINGS: Skeleton: Carious posterior dentition. No acute osseous abnormality identified. Visualized paranasal sinuses and mastoids are stable and well pneumatized. Upper chest: Superior mediastinal lipomatosis. No superior mediastinal lymphadenopathy. Negative visualized lung parenchyma. Other neck: Negative thyroid, larynx, pharynx, parapharyngeal spaces, retropharyngeal space, sublingual space, submandibular glands and parotid glands. No cervical lymphadenopathy. Aortic arch: 3 vessel arch configuration. Mild distal arch calcified atherosclerosis. Right carotid system: No brachiocephalic artery or right CCA origin stenosis. Mildly tortuous proximal right CCA. Soft plaque along the anterior right CCA without stenosis (series 401, image 74). Soft and calcified plaque at the right carotid bifurcation with no proximal right ICA stenosis (series 404, image 93). Severely tortuous mid cervical right ICA. Otherwise negative right ICA to the level of the distal petrous segment. Left carotid system: No left CCA origin stenosis. Tortuous proximal left CCA. Minimal soft plaque proximal to the left carotid bifurcation. At the left carotid bifurcation there is low-density soft and calcified plaque affecting the left ICA origin and bulb. Subsequent stenosis is up to 50 % with respect to the distal vessel in the proximal left ICA. Mildly tortuous more distal cervical left ICA. Visible left siphon is patent with mild calcified plaque. Vertebral arteries:No proximal right subclavian artery stenosis. Right vertebral artery origin  appears normal. There is a late entry of the right vertebral artery into the cervical transverse foramen. The right vertebral artery is mildly dominant. No right vertebral artery stenosis to the vertebrobasilar junction. Visible basilar artery is unremarkable. No proximal left subclavian artery stenosis despite mild plaque. The left vertebral artery origin is within normal limits.  Tortuous left V1 segment. The left vertebral artery is mildly non dominant and patent to the vertebrobasilar junction without stenosis. Normal left PICA origin. IMPRESSION: 1. Soft and calcified bilateral carotid atherosclerosis in the neck with no hemodynamically significant stenosis; 50% proximal left ICA stenosis. 2. Dominant right vertebral artery with no definite vertebral artery stenosis. 3. Carious posterior dentition. Electronically Signed   By: Odessa Fleming M.D.   On: 10/13/2016 13:01   Mr Maxine Glenn Head Wo Contrast  Result Date: 10/13/2016 CLINICAL DATA:  RIGHT-sided weakness for 2 days. Hypertensive, RIGHT-sided chest pain and elevated D-dimer. Confusion. Assess stroke. EXAM: MRI HEAD WITHOUT CONTRAST MRA HEAD WITHOUT CONTRAST MRA NECK WITHOUT AND WITH CONTRAST TECHNIQUE: Multiplanar, multiecho pulse sequences of the brain and surrounding structures were obtained without intravenous contrast. Angiographic images of the Circle of Willis were obtained using MRA technique without intravenous contrast. Angiographic images of the neck were obtained using MRA technique without and with intravenous contrast. Carotid stenosis measurements (when applicable) are obtained utilizing NASCET criteria, using the distal internal carotid diameter as the denominator. CONTRAST:  20mL MULTIHANCE GADOBENATE DIMEGLUMINE 529 MG/ML IV SOLN COMPARISON:  CT HEAD October 12, 2016 FINDINGS: MRI HEAD FINDINGS BRAIN: 2 x 3 cm area reduced diffusion LEFT mesial posterior frontal lobe. subcentimeter foci of reduced diffusion posterior to the dominant area, within LEFT mesial frontal and parietal lobes, with corresponding low ADC values and slight bright FLAIR T2 signal. No susceptibility artifact to suggest hemorrhage. A few scattered subcentimeter supratentorial white matter FLAIR T2 hyperintensities. The ventricles and sulci are normal for patient's age. No suspicious parenchymal signal, masses or mass effect. No abnormal extra-axial fluid  collections. VASCULAR: Normal major intracranial vascular flow voids present at skull base. SKULL AND UPPER CERVICAL SPINE: No abnormal sellar expansion. No suspicious calvarial bone marrow signal. Craniocervical junction maintained. SINUSES/ORBITS: The mastoid air-cells and included paranasal sinuses are well-aerated. The included ocular globes and orbital contents are non-suspicious. OTHER: Subcentimeter lymph nodes bilateral parotid glands. MRA HEAD FINDINGS ANTERIOR CIRCULATION: Normal flow related enhancement of the included cervical, petrous, cavernous and supraclinoid internal carotid arteries. Patent anterior communicating artery. Predominately azygos ACA, occluded branch of the callosum marginal mid to distal segment. Normal flow related enhancement of the middle cerebral arteries, including distal segments. No large vessel occlusion, high-grade stenosis, abnormal luminal irregularity, aneurysm. POSTERIOR CIRCULATION: Codominant vertebral artery's. Basilar artery is patent, with normal flow related enhancement of the main branch vessels. Normal flow related enhancement of the posterior cerebral arteries. No large vessel occlusion, high-grade stenosis, abnormal luminal irregularity, aneurysm. ANATOMIC VARIANTS: None. MRA NECK FINDINGS ANTERIOR CIRCULATION: The common carotid arteries are widely patent bilaterally. The carotid bifurcation are patent bilaterally. Within 5 mm of the bilateral internal carotid artery origins are 2 mm segments of 50% stenosis by NASCET criteria. No flow limiting stenosis of the cervical internal carotid arteries. POSTERIOR CIRCULATION: Bilateral vertebral arteries are patent to the vertebrobasilar junction. No evidence for atherosclerosis or flow limiting stenosis. Source images and MIP image were reviewed. IMPRESSION: MRI HEAD: Acute nonhemorrhagic LEFT frontoparietal/ACA territory infarcts. Mild chronic small vessel ischemic disease. MRA HEAD: Occluded mid to distal  callosomarginal branch of the anterior cerebral artery.  Predominately azygos ACA. MRA NECK: 50% stenosis bilateral proximal internal carotid artery origins. Electronically Signed   By: Awilda Metro M.D.   On: 10/13/2016 02:35   Mr Angiogram Neck W Or Wo Contrast  Result Date: 10/13/2016 CLINICAL DATA:  RIGHT-sided weakness for 2 days. Hypertensive, RIGHT-sided chest pain and elevated D-dimer. Confusion. Assess stroke. EXAM: MRI HEAD WITHOUT CONTRAST MRA HEAD WITHOUT CONTRAST MRA NECK WITHOUT AND WITH CONTRAST TECHNIQUE: Multiplanar, multiecho pulse sequences of the brain and surrounding structures were obtained without intravenous contrast. Angiographic images of the Circle of Willis were obtained using MRA technique without intravenous contrast. Angiographic images of the neck were obtained using MRA technique without and with intravenous contrast. Carotid stenosis measurements (when applicable) are obtained utilizing NASCET criteria, using the distal internal carotid diameter as the denominator. CONTRAST:  20mL MULTIHANCE GADOBENATE DIMEGLUMINE 529 MG/ML IV SOLN COMPARISON:  CT HEAD October 12, 2016 FINDINGS: MRI HEAD FINDINGS BRAIN: 2 x 3 cm area reduced diffusion LEFT mesial posterior frontal lobe. subcentimeter foci of reduced diffusion posterior to the dominant area, within LEFT mesial frontal and parietal lobes, with corresponding low ADC values and slight bright FLAIR T2 signal. No susceptibility artifact to suggest hemorrhage. A few scattered subcentimeter supratentorial white matter FLAIR T2 hyperintensities. The ventricles and sulci are normal for patient's age. No suspicious parenchymal signal, masses or mass effect. No abnormal extra-axial fluid collections. VASCULAR: Normal major intracranial vascular flow voids present at skull base. SKULL AND UPPER CERVICAL SPINE: No abnormal sellar expansion. No suspicious calvarial bone marrow signal. Craniocervical junction maintained. SINUSES/ORBITS:  The mastoid air-cells and included paranasal sinuses are well-aerated. The included ocular globes and orbital contents are non-suspicious. OTHER: Subcentimeter lymph nodes bilateral parotid glands. MRA HEAD FINDINGS ANTERIOR CIRCULATION: Normal flow related enhancement of the included cervical, petrous, cavernous and supraclinoid internal carotid arteries. Patent anterior communicating artery. Predominately azygos ACA, occluded branch of the callosum marginal mid to distal segment. Normal flow related enhancement of the middle cerebral arteries, including distal segments. No large vessel occlusion, high-grade stenosis, abnormal luminal irregularity, aneurysm. POSTERIOR CIRCULATION: Codominant vertebral artery's. Basilar artery is patent, with normal flow related enhancement of the main branch vessels. Normal flow related enhancement of the posterior cerebral arteries. No large vessel occlusion, high-grade stenosis, abnormal luminal irregularity, aneurysm. ANATOMIC VARIANTS: None. MRA NECK FINDINGS ANTERIOR CIRCULATION: The common carotid arteries are widely patent bilaterally. The carotid bifurcation are patent bilaterally. Within 5 mm of the bilateral internal carotid artery origins are 2 mm segments of 50% stenosis by NASCET criteria. No flow limiting stenosis of the cervical internal carotid arteries. POSTERIOR CIRCULATION: Bilateral vertebral arteries are patent to the vertebrobasilar junction. No evidence for atherosclerosis or flow limiting stenosis. Source images and MIP image were reviewed. IMPRESSION: MRI HEAD: Acute nonhemorrhagic LEFT frontoparietal/ACA territory infarcts. Mild chronic small vessel ischemic disease. MRA HEAD: Occluded mid to distal callosomarginal branch of the anterior cerebral artery. Predominately azygos ACA. MRA NECK: 50% stenosis bilateral proximal internal carotid artery origins. Electronically Signed   By: Awilda Metro M.D.   On: 10/13/2016 02:35   Mr Brain Wo  Contrast  Result Date: 10/13/2016 CLINICAL DATA:  RIGHT-sided weakness for 2 days. Hypertensive, RIGHT-sided chest pain and elevated D-dimer. Confusion. Assess stroke. EXAM: MRI HEAD WITHOUT CONTRAST MRA HEAD WITHOUT CONTRAST MRA NECK WITHOUT AND WITH CONTRAST TECHNIQUE: Multiplanar, multiecho pulse sequences of the brain and surrounding structures were obtained without intravenous contrast. Angiographic images of the Circle of Willis were obtained using MRA technique without intravenous contrast. Angiographic  images of the neck were obtained using MRA technique without and with intravenous contrast. Carotid stenosis measurements (when applicable) are obtained utilizing NASCET criteria, using the distal internal carotid diameter as the denominator. CONTRAST:  20mL MULTIHANCE GADOBENATE DIMEGLUMINE 529 MG/ML IV SOLN COMPARISON:  CT HEAD October 12, 2016 FINDINGS: MRI HEAD FINDINGS BRAIN: 2 x 3 cm area reduced diffusion LEFT mesial posterior frontal lobe. subcentimeter foci of reduced diffusion posterior to the dominant area, within LEFT mesial frontal and parietal lobes, with corresponding low ADC values and slight bright FLAIR T2 signal. No susceptibility artifact to suggest hemorrhage. A few scattered subcentimeter supratentorial white matter FLAIR T2 hyperintensities. The ventricles and sulci are normal for patient's age. No suspicious parenchymal signal, masses or mass effect. No abnormal extra-axial fluid collections. VASCULAR: Normal major intracranial vascular flow voids present at skull base. SKULL AND UPPER CERVICAL SPINE: No abnormal sellar expansion. No suspicious calvarial bone marrow signal. Craniocervical junction maintained. SINUSES/ORBITS: The mastoid air-cells and included paranasal sinuses are well-aerated. The included ocular globes and orbital contents are non-suspicious. OTHER: Subcentimeter lymph nodes bilateral parotid glands. MRA HEAD FINDINGS ANTERIOR CIRCULATION: Normal flow related  enhancement of the included cervical, petrous, cavernous and supraclinoid internal carotid arteries. Patent anterior communicating artery. Predominately azygos ACA, occluded branch of the callosum marginal mid to distal segment. Normal flow related enhancement of the middle cerebral arteries, including distal segments. No large vessel occlusion, high-grade stenosis, abnormal luminal irregularity, aneurysm. POSTERIOR CIRCULATION: Codominant vertebral artery's. Basilar artery is patent, with normal flow related enhancement of the main branch vessels. Normal flow related enhancement of the posterior cerebral arteries. No large vessel occlusion, high-grade stenosis, abnormal luminal irregularity, aneurysm. ANATOMIC VARIANTS: None. MRA NECK FINDINGS ANTERIOR CIRCULATION: The common carotid arteries are widely patent bilaterally. The carotid bifurcation are patent bilaterally. Within 5 mm of the bilateral internal carotid artery origins are 2 mm segments of 50% stenosis by NASCET criteria. No flow limiting stenosis of the cervical internal carotid arteries. POSTERIOR CIRCULATION: Bilateral vertebral arteries are patent to the vertebrobasilar junction. No evidence for atherosclerosis or flow limiting stenosis. Source images and MIP image were reviewed. IMPRESSION: MRI HEAD: Acute nonhemorrhagic LEFT frontoparietal/ACA territory infarcts. Mild chronic small vessel ischemic disease. MRA HEAD: Occluded mid to distal callosomarginal branch of the anterior cerebral artery. Predominately azygos ACA. MRA NECK: 50% stenosis bilateral proximal internal carotid artery origins. Electronically Signed   By: Awilda Metro M.D.   On: 10/13/2016 02:35      Daffney Greenly M.D on 10/14/2016 at 11:58 AM  Between 7am to 7pm - Pager - [phone]  After 7pm go to [email or web address] - password [facility]  [facility] -  Office  [phone]

## 2016-10-14 NOTE — Consult Note (Signed)
[facility] [place] [place] CONSULT [place]   MRN : [id number]  Reason for Consult: Carotid stenosis Referring Physician: Dr. Roda ShuttersXu  History of Present Illness: 61 y/o male admitted with symptoms of CVA with right sided weakness in the UE and LE.    Carotid CTA shows mild carotid stenosis showing right carotid < 40% stenosis and left 50% stenosis.   Past medical history includes: HTN not medicated controlled with diet and exercise.  On admission systolic and diastolic were significantly elevated and hypercholesterolemia managed with Lipitor and ASA.  He denise DM and CAD.     Current Facility-Administered Medications  Medication Dose Route Frequency Provider Last Rate Last Dose  . 0.9 %  sodium chloride infusion   Intravenous Continuous Lorre NickAnthony Allen, MD 20 mL/hr at 10/12/16 2111    . 0.9 %  sodium chloride infusion   Intravenous Continuous Clydie Braunondell A Smith, MD 75 mL/hr at 10/14/16 0517    . acetaminophen (TYLENOL) tablet 650 mg  650 mg Oral Q4H PRN Clydie Braunondell A Smith, MD       Or  . acetaminophen (TYLENOL) solution 650 mg  650 mg Per Tube Q4H PRN Clydie Braunondell A Smith, MD       Or  . acetaminophen (TYLENOL) suppository 650 mg  650 mg Rectal Q4H PRN Clydie Braunondell A Smith, MD      . aspirin tablet 325 mg  325 mg Oral Daily Clydie Braunondell A Smith, MD   325 mg at 10/13/16 1006  . atorvastatin (LIPITOR) tablet 40 mg  40 mg Oral q1800 Marvel PlanJindong Xu, MD      . carvedilol (COREG) tablet 3.125 mg  3.125 mg Oral BID WC Starleen Armsawood S Elgergawy, MD   3.125 mg at 10/14/16 0804  . enoxaparin (LOVENOX) injection 40 mg  40 mg Subcutaneous Q24H Rondell A Katrinka BlazingSmith, MD   40 mg at 10/13/16 1007  . senna-docusate (Senokot-S) tablet 1 tablet  1 tablet Oral QHS PRN Clydie Braunondell A Smith, MD        Pt meds include: Statin :Yes Betablocker: No ASA: Yes Other anticoagulants/antiplatelets:   Past Medical History:  Diagnosis Date  . HTN (hypertension)     Past Surgical History:  Procedure Laterality Date  . HERNIA REPAIR       Social History Social History  Substance Use Topics  . Smoking status: Never Smoker  . Smokeless tobacco: Never Used  . Alcohol use No    Family History Family History  Problem Relation Age of Onset  . Diabetes Mother   . Heart disease Father 3660    CABG  . Diabetes Father   . Kidney disease Father   . Kidney failure Father   . Hypertension Sister     No Known Allergies   REVIEW OF SYSTEMS  General: [ ]  Weight loss, [ ]  Fever, [ ]  chills Neurologic: [ ]  Dizziness, [ ]  Blackouts, [ ]  Seizure [ ]  Stroke, [ ]  "Mini stroke", [ ]  Slurred speech, [ ]  Temporary blindness; [x ] weakness in arms or legs, [ ]  Hoarseness [ ]  Dysphagia Cardiac: [ ]  Chest pain/pressure, [ ]  Shortness of breath at rest [ ]  Shortness of breath with exertion, [ ]  Atrial fibrillation or irregular heartbeat  Vascular: [ ]  Pain in legs with walking, [ ]  Pain in legs at rest, [ ]  Pain in legs at night,  [ ]  Non-healing ulcer, [ ]  Blood clot in vein/DVT,   Pulmonary: [ ]  Home oxygen, [ ]  Productive cough, [ ]  Coughing up blood, [ ]   Asthma,  [ ]  Wheezing [ ]  COPD Musculoskeletal:  [ ]  Arthritis, [ ]  Low back pain, [ ]  Joint pain Hematologic: [ ]  Easy Bruising, [ ]  Anemia; [ ]  Hepatitis Gastrointestinal: [ ]  Blood in stool, [ ]  Gastroesophageal Reflux/heartburn, Urinary: [ ]  chronic Kidney disease, [ ]  on HD - [ ]  MWF or [ ]  TTHS, [ ]  Burning with urination, [ ]  Difficulty urinating Skin: [ ]  Rashes, [ ]  Wounds Psychological: [ ]  Anxiety, [ ]  Depression  Physical Examination Vitals:   10/13/16 2120 10/14/16 0120 10/14/16 0520 10/14/16 0802  BP: (!) 160/88 (!) 171/89 (!) 177/93 (!) 187/98  Pulse: 81 76 77 84  Resp: 18 18 18    Temp: 98 F (36.7 C) 98.3 F (36.8 C) 98.3 F (36.8 C)   TempSrc: Oral Oral Oral   SpO2: 98% 95% 95% 97%  Weight:      Height:       Body mass index is 36.08 kg/m.  General:  WDWN in NAD Gait: Normal HENT: WNL Eyes: Pupils equal Pulmonary: normal non-labored breathing  , without Rales, rhonchi,  wheezing Cardiac: RRR, without  Murmurs, rubs or gallops; No carotid bruits Abdomen: soft, NT, no masses Skin: no rashes, ulcers noted;  no Gangrene , no cellulitis; no open wounds;   Vascular Exam/Pulses:Palpable radial, femoral,DP pulses   Musculoskeletal: no muscle wasting or atrophy; no edema  Neurologic: A&O X 3; Appropriate Affect ;  SENSATION: normal; MOTOR FUNCTION: 5/5 Symmetric Speech is fluent/normal   Significant Diagnostic Studies: CBC Lab Results  Component Value Date   WBC 7.6 10/14/2016   HGB 14.4 10/14/2016   HCT 44.3 10/14/2016   MCV 92.7 10/14/2016   PLT 140 (L) 10/14/2016    BMET    Component Value Date/Time   NA 139 10/14/2016 0553   K 4.0 10/14/2016 0553   CL 105 10/14/2016 0553   CO2 22 10/14/2016 0553   GLUCOSE 137 (H) 10/14/2016 0553   BUN 9 10/14/2016 0553   CREATININE 1.02 10/14/2016 0553   CREATININE 0.83 10/29/2015 1719   CALCIUM 9.0 10/14/2016 0553   GFRNONAA >60 10/14/2016 0553   GFRAA >60 10/14/2016 0553   Estimated Creatinine Clearance: 103.3 mL/min (by C-G formula based on SCr of 1.02 mg/dL).  COAG Lab Results  Component Value Date   INR 1.05 10/12/2016     Non-Invasive Vascular Imaging:  IMPRESSION: 1. Soft and calcified bilateral carotid atherosclerosis in the neck with no hemodynamically significant stenosis; 50% proximal left ICA stenosis. 2. Dominant right vertebral artery with no definite vertebral artery stenosis. 3. Carious posterior dentition.  CTA brain IMPRESSION: No acute intracranial abnormality.  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  Resultant  right LE slightly weakness  CT of head. No acute abnormality   MRI  left frontoparietal/ACA territory infarcts.  MRA head  occluded colossal marginal branch of the azygos ACA  MRA neck  50% stenosis B ICAs   CTA  neck 50% left ICA/CCA stenosis with calcification and soft plaque  ASSESSMENT/PLAN:  Left carotid stenosis CTA 50 % ICA Pending further work up 2D echo and lab work. Pending source of infarts. If the source is deemed to be the carotid stenosis we will discuss plans for left CEA with Dr. Diamond Nickel, EMMA [facility] 10/14/2016 8:18 AM    I agree with the above.  I have seen and evaluated the patient.  Briefly this  is a 61 year old gentleman who presented with a left brain infarct.  When he presented he is outside the window for TPA.  He has undergone a full workup which has only been positive for a 50% left extracranial carotid bifurcation stenosis containing calcification and small plaque.  Neurology feels that this is most likely the source of his stroke.  He has regained nearly all of his function.  We discussed medical management versus surgical intervention and the pros and cons of each option.  I told him that given his age and that this is his only obvious possible source for stroke that I would probably recommend proceeding with left carotid endarterectomy.  He is going to discuss this with his family and I will talk with him again tomorrow.  If he wishes to proceed with surgery, I will add him onto the Friday schedule.  Durene Cal

## 2016-10-14 NOTE — Evaluation (Signed)
Speech Language Pathology Evaluation Patient Details Name: Jason Melendez Jason Melendez MRN: [id number] DOB: Jan 17, 1956 Today's Date: 10/14/2016 Time: [id number] SLP Time Calculation (min) (ACUTE ONLY): 18 min  Problem List:  Patient Active Problem List   Diagnosis Date Noted  . CVA (cerebral vascular accident) (HCC) 10/13/2016  . HTN (hypertension) 10/13/2016  . HLD (hyperlipidemia) 10/13/2016  . Obesity (BMI 30.0-34.9)    Past Medical History:  Past Medical History:  Diagnosis Date  . HTN (hypertension)    Past Surgical History:  Past Surgical History:  Procedure Laterality Date  . HERNIA REPAIR     HPI:  61 y.o. male with medical history significant of HTN; who presented with a three-day history of intermittent right-sided weakness.   Assessment / Plan / Recommendation Clinical Impression  Pt appears to be at his cognitive-linguistic baseline. He describes some word-finding difficulties upon admission, but believes that this has resolved. No additional SLP f/u needed.    SLP Assessment  Patient does not need any further Speech Lanaguage Pathology Services    Follow Up Recommendations  Other (comment) (intermittent supervision upon return home)    Frequency and Duration           SLP Evaluation Cognition  Overall Cognitive Status: Within Functional Limits for tasks assessed       Comprehension  Auditory Comprehension Overall Auditory Comprehension: Appears within functional limits for tasks assessed    Expression Expression Primary Mode of Expression: Verbal Verbal Expression Overall Verbal Expression: Appears within functional limits for tasks assessed Written Expression Dominant Hand: Right   Oral / Motor  Motor Speech Overall Motor Speech: Appears within functional limits for tasks assessed   GO                    Maxcine Hamaiewonsky, Ryah Cribb 10/14/2016, 12:08 PM  Maxcine HamLaura Paiewonsky, M.A. CCC-SLP [phone]

## 2016-10-14 NOTE — Progress Notes (Signed)
STROKE TEAM PROGRESS NOTE   SUBJECTIVE (INTERVAL HISTORY) Wife and daughters are at bedside. Muscle stress much improved, walk with PT in the hallway without dragging. Cardiology and vascular surgery consulted. Patient had no complaints.   OBJECTIVE Temp:  [98 F (36.7 C)-98.3 F (36.8 C)] 98.1 F (36.7 C) (02/14 1441) Pulse Rate:  [76-95] 95 (02/14 1441) Cardiac Rhythm: Normal sinus rhythm (02/14 0700) Resp:  [18-19] 19 (02/14 1441) BP: (160-187)/(82-98) 161/84 (02/14 1441) SpO2:  [95 %-99 %] 99 % (02/14 1441)  CBC:   Recent Labs Lab 10/12/16 1949 10/13/16 0217 10/14/16 0553  WBC 6.4 8.0 7.6  NEUTROABS 3.4 5.5  --   HGB 14.8 14.1 14.4  HCT 44.5 42.4 44.3  MCV 91.2 91.8 92.7  PLT 145* 137* 140*    Basic Metabolic Panel:   Recent Labs Lab 10/13/16 0217 10/14/16 0553  NA 140 139  K 3.8 4.0  CL 105 105  CO2 24 22  GLUCOSE 153* 137*  BUN 10 9  CREATININE 1.05 1.02  CALCIUM 9.1 9.0    Lipid Panel:     Component Value Date/Time   CHOL 176 10/13/2016 0217   TRIG 206 (H) 10/13/2016 0217   HDL 26 (L) 10/13/2016 0217   CHOLHDL 6.8 10/13/2016 0217   VLDL 41 (H) 10/13/2016 0217   LDLCALC 109 (H) 10/13/2016 0217   HgbA1c:  Lab Results  Component Value Date   HGBA1C 6.4 (H) 10/13/2016   Urine Drug Screen:     Component Value Date/Time   LABOPIA NONE DETECTED 10/12/2016 2337   COCAINSCRNUR NONE DETECTED 10/12/2016 2337   LABBENZ NONE DETECTED 10/12/2016 2337   AMPHETMU NONE DETECTED 10/12/2016 2337   THCU NONE DETECTED 10/12/2016 2337   LABBARB NONE DETECTED 10/12/2016 2337      IMAGING I have personally reviewed the radiological images below and agree with the radiology interpretations.  Ct Head Wo Contrast 10/12/2016 No acute intracranial abnormality.   Mr Shirlee LatchMra Head Wo Contrast 10/13/2016 Occluded mid to distal callosomarginal branch of the anterior cerebral artery. Predominately azygos ACA.   Mr Angiogram Neck W Or Wo Contrast 10/13/2016 50%  stenosis bilateral proximal internal carotid artery origins.   Mr Brain Wo Contrast 10/13/2016 Acute nonhemorrhagic LEFT frontoparietal/ACA territory infarcts. Mild chronic small vessel ischemic disease.   Ct Angio Neck W Or Wo Contrast 10/13/2016 IMPRESSION: 1. Soft and calcified bilateral carotid atherosclerosis in the neck with no hemodynamically significant stenosis; 50% proximal left ICA stenosis. 2. Dominant right vertebral artery with no definite vertebral artery stenosis. 3. Carious posterior dentition.   LE venous Doppler - no DVT  TTE - Left ventricle: The cavity size was mildly dilated. There was   mild focal basal hypertrophy of the septum. Systolic function was   mildly to moderately reduced. The estimated ejection fraction was   in the range of 40% to 45%. Diffuse hypokinesis. There was an   increased relative contribution of atrial contraction to   ventricular filling. Doppler parameters are consistent with   abnormal left ventricular relaxation (grade 1 diastolic   dysfunction). - Aortic valve: Trileaflet; mildly thickened, mildly calcified   leaflets. - Left atrium: The atrium was mildly dilated. - Pulmonary arteries: Systolic pressure could not be accurately   estimated.   PHYSICAL EXAM  Temp:  [98 F (36.7 C)-98.3 F (36.8 C)] 98.1 F (36.7 C) (02/14 1441) Pulse Rate:  [76-95] 95 (02/14 1441) Resp:  [18-19] 19 (02/14 1441) BP: (160-187)/(82-98) 161/84 (02/14 1441) SpO2:  [95 %-99 %]  99 % (02/14 1441)  General - Well nourished, well developed, in no apparent distress.  Ophthalmologic - Sharp disc margins OU.   Cardiovascular - Regular rate and rhythm.  Mental Status -  Level of arousal and orientation to time, place, and person were intact. Language including expression, naming, repetition, comprehension was assessed and found intact. Attention span and concentration were normal. Fund of Knowledge was assessed and was intact.  Cranial Nerves II - XII  - II - Visual field intact OU. III, IV, VI - Extraocular movements intact. V - Facial sensation intact bilaterally. VII - Facial movement intact bilaterally. VIII - Hearing & vestibular intact bilaterally. X - Palate elevates symmetrically. XI - Chin turning & shoulder shrug intact bilaterally. XII - Tongue protrusion intact.  Motor Strength - The patient's strength was normal in all extremities and pronator drift was absent.  Bulk was normal and fasciculations were absent.   Motor Tone - Muscle tone was assessed at the neck and appendages and was normal.  Reflexes - The patient's reflexes were 1+ in all extremities and he had no pathological reflexes.  Sensory - Light touch, temperature/pinprick were assessed and were symmetrical.    Coordination - The patient had normal movements in the hands with no ataxia or dysmetria.  Tremor was absent.  Gait and Station - deferred   ASSESSMENT/PLAN [name] is a 61 y.o. male with history of hypertension presenting with right-sided weakness 3 days. He did not receive IV t-PA due to delay in arrival.   StrokeA:  left ACA territory infarcts embolic secondary to unclear source, favor left ICA/CCA soft plaque and calcification with 50% stenosis over cardioembolic source  Resultant  right LE slightly weakness  CT of head. No acute abnormality   MRI  left frontoparietal/ACA territory infarcts.  MRA head  occluded colossal marginal branch of the azygos ACA  MRA neck  50% stenosis B ICAs   CTA neck 50% left ICA/CCA stenosis with calcification and soft plaque  Recommend vascular surgery consultation for left ICA stenosis 50% with calcification and soft plaques  2D Echo  EF 40-45% with diffuse hypokinesis  Lower extremity Dopplers no DVT  LDL 109  HgbA1c pending   Lovenox 40 mg sq daily for VTE prophylaxis Diet Heart Room service appropriate? Yes; Fluid consistency: Thin  No antithrombotics prior to admission, now on  aspirin 325 mg daily.   Patient counseled to be compliant with his antithrombotic medications  Ongoing aggressive stroke risk factor management  Therapy recommendations:  No PT  Disposition:  pending   Carotid artery stenosis  Left ICA/CCA 50% stenosis with calcification soft plaques  Feels most likely the cause for stroke  Vascular surgery on board  Cardiomyopathy  EF 40-45% with diffuse hypokinesis  Cardiology on board  May consider 30 day cardiac event monitoring vs. TEE/loop if vascular surgery does not feel left ICA stenosis impressive.  Hypertension  Unstable  Permissive hypertension (OK if < 220/120) but gradually normalize in 5-7 days  Long-term BP goal normotensive  Hyperlipidemia  Home meds:  No statin  LDL 109, goal < 70  Lipitor 40 added  Continue statin at discharge  Other Stroke Risk Factors  UDS negative  Obesity, Body mass index is 36.08 kg/m., recommend weight loss, diet and exercise as appropriate   Other Active Problems    Hospital day # 1  Marvel Plan, MD PhD Stroke Neurology 10/14/2016 5:09 PM   To contact Stroke Continuity provider, please refer to [email or web address].  After hours, contact General Neurology

## 2016-10-15 ENCOUNTER — Inpatient Hospital Stay (HOSPITAL_COMMUNITY): Payer: 59

## 2016-10-15 DIAGNOSIS — I420 Dilated cardiomyopathy: Secondary | ICD-10-CM

## 2016-10-15 DIAGNOSIS — I509 Heart failure, unspecified: Secondary | ICD-10-CM

## 2016-10-15 LAB — NM MYOCAR MULTI W/SPECT W/WALL MOTION / EF
Estimated workload: 1 METS
Exercise duration (min): 5 min
Exercise duration (sec): 16 s
Peak HR: 130 {beats}/min
Rest HR: 86 {beats}/min

## 2016-10-15 LAB — BASIC METABOLIC PANEL
Anion gap: 7 (ref 5–15)
BUN: 11 mg/dL (ref 6–20)
CO2: 27 mmol/L (ref 22–32)
Calcium: 8.9 mg/dL (ref 8.9–10.3)
Chloride: 105 mmol/L (ref 101–111)
Creatinine, Ser: 1.06 mg/dL (ref 0.61–1.24)
GFR calc Af Amer: >60 mL/min (ref >60)
GFR calc non Af Amer: >60 mL/min (ref >60)
Glucose, Bld: 142 mg/dL — ABNORMAL HIGH (ref 65–99)
Potassium: 3.7 mmol/L (ref 3.5–5.1)
Sodium: 139 mmol/L (ref 135–145)

## 2016-10-15 LAB — CBC
HCT: 40.2 % (ref 39.0–52.0)
Hemoglobin: 13 g/dL (ref 13.0–17.0)
MCH: 29.7 pg (ref 26.0–34.0)
MCHC: 32.3 g/dL (ref 30.0–36.0)
MCV: 92 fL (ref 78.0–100.0)
Platelets: 132 10*3/uL — ABNORMAL LOW (ref 150–400)
RBC: 4.37 MIL/uL (ref 4.22–5.81)
RDW: 13.1 % (ref 11.5–15.5)
WBC: 6.3 10*3/uL (ref 4.0–10.5)

## 2016-10-15 MED ORDER — TECHNETIUM TC 99M TETROFOSMIN IV KIT
30.0000 | PACK | Freq: Once | INTRAVENOUS | Status: AC | PRN
  Administered 2016-10-15: 30 via INTRAVENOUS

## 2016-10-15 MED ORDER — CARVEDILOL 6.25 MG PO TABS: 6.2500 mg | ORAL_TABLET | Freq: Two times a day (BID) | ORAL | Status: DC

## 2016-10-15 MED ORDER — CARVEDILOL 3.125 MG PO TABS
3.1250 mg | ORAL_TABLET | Freq: Two times a day (BID) | ORAL | Status: DC
  Administered 2016-10-15 – 2016-10-17 (×4): 3.125 mg via ORAL
  Filled 2016-10-15 (×4): qty 1

## 2016-10-15 MED ORDER — TECHNETIUM TC 99M TETROFOSMIN IV KIT
10.0000 | PACK | Freq: Once | INTRAVENOUS | Status: AC | PRN
  Administered 2016-10-15: 10 via INTRAVENOUS

## 2016-10-15 MED ORDER — REGADENOSON 0.4 MG/5ML IV SOLN
0.4000 mg | Freq: Once | INTRAVENOUS | Status: AC
  Administered 2016-10-15: 0.4 mg via INTRAVENOUS
  Filled 2016-10-15: qty 5

## 2016-10-15 MED ORDER — REGADENOSON 0.4 MG/5ML IV SOLN
INTRAVENOUS | Status: AC
  Administered 2016-10-15: 0.4 mg via INTRAVENOUS
  Filled 2016-10-15: qty 5

## 2016-10-15 NOTE — Progress Notes (Signed)
IMPRESSION: 1. Mild attenuation of the left ventricular apex without evidence of inducible myocardial ischemia.  2. Global left ventricular hypokinesis with LV cavity dilatation.  3. Left ventricular ejection fraction 34%  4. Non invasive risk stratification*: Intermediate  Intermediate risk due to low EF which is an over estimate of severity. No ischemia. Cleared for carotid surgery if needed.

## 2016-10-15 NOTE — Progress Notes (Signed)
STROKE TEAM PROGRESS NOTE   SUBJECTIVE (INTERVAL HISTORY) Wife and daughters are at bedside. Pt neuro intact, back to baseline. Stress test showed global hypokinesis with EF 34%, cleared for CEA. VVS plans for left CEA tomorrow.   OBJECTIVE Temp:  [97.7 F (36.5 C)-98.7 F (37.1 C)] 98.7 F (37.1 C) (02/15 2136) Pulse Rate:  [64-96] 85 (02/15 2136) Cardiac Rhythm: Normal sinus rhythm (02/15 1900) Resp:  [18-20] 18 (02/15 2136) BP: (130-213)/(60-105) 145/69 (02/15 2136) SpO2:  [95 %-97 %] 96 % (02/15 2136)  CBC:   Recent Labs Lab 10/12/16 1949 10/13/16 0217 10/14/16 0553 10/15/16 0250  WBC 6.4 8.0 7.6 6.3  NEUTROABS 3.4 5.5  --   --   HGB 14.8 14.1 14.4 13.0  HCT 44.5 42.4 44.3 40.2  MCV 91.2 91.8 92.7 92.0  PLT 145* 137* 140* 132*    Basic Metabolic Panel:   Recent Labs Lab 10/14/16 0553 10/15/16 0250  NA 139 139  K 4.0 3.7  CL 105 105  CO2 22 27  GLUCOSE 137* 142*  BUN 9 11  CREATININE 1.02 1.06  CALCIUM 9.0 8.9    Lipid Panel:     Component Value Date/Time   CHOL 176 10/13/2016 0217   TRIG 206 (H) 10/13/2016 0217   HDL 26 (L) 10/13/2016 0217   CHOLHDL 6.8 10/13/2016 0217   VLDL 41 (H) 10/13/2016 0217   LDLCALC 109 (H) 10/13/2016 0217   HgbA1c:  Lab Results  Component Value Date   HGBA1C 6.4 (H) 10/13/2016   Urine Drug Screen:     Component Value Date/Time   LABOPIA NONE DETECTED 10/12/2016 2337   COCAINSCRNUR NONE DETECTED 10/12/2016 2337   LABBENZ NONE DETECTED 10/12/2016 2337   AMPHETMU NONE DETECTED 10/12/2016 2337   THCU NONE DETECTED 10/12/2016 2337   LABBARB NONE DETECTED 10/12/2016 2337      IMAGING I have personally reviewed the radiological images below and agree with the radiology interpretations.  Ct Head Wo Contrast 10/12/2016 No acute intracranial abnormality.   Mr Shirlee Latch Wo Contrast 10/13/2016 Occluded mid to distal callosomarginal branch of the anterior cerebral artery. Predominately azygos ACA.   Mr Angiogram  Neck W Or Wo Contrast 10/13/2016 50% stenosis bilateral proximal internal carotid artery origins.   Mr Brain Wo Contrast 10/13/2016 Acute nonhemorrhagic LEFT frontoparietal/ACA territory infarcts. Mild chronic small vessel ischemic disease.   Ct Angio Neck W Or Wo Contrast 10/13/2016 IMPRESSION: 1. Soft and calcified bilateral carotid atherosclerosis in the neck with no hemodynamically significant stenosis; 50% proximal left ICA stenosis. 2. Dominant right vertebral artery with no definite vertebral artery stenosis. 3. Carious posterior dentition.   LE venous Doppler - no DVT  TTE - Left ventricle: The cavity size was mildly dilated. There was   mild focal basal hypertrophy of the septum. Systolic function was   mildly to moderately reduced. The estimated ejection fraction was   in the range of 40% to 45%. Diffuse hypokinesis. There was an   increased relative contribution of atrial contraction to   ventricular filling. Doppler parameters are consistent with   abnormal left ventricular relaxation (grade 1 diastolic   dysfunction). - Aortic valve: Trileaflet; mildly thickened, mildly calcified   leaflets. - Left atrium: The atrium was mildly dilated. - Pulmonary arteries: Systolic pressure could not be accurately   estimated.  Myoview stress test IMPRESSION: 1. Mild attenuation of the left ventricular apex without evidence of inducible myocardial ischemia. 2. Global left ventricular hypokinesis with LV cavity dilatation. 3. Left ventricular  ejection fraction 34% 4. Non invasive risk stratification: Intermediate Intermediate risk due to low EF which is an over estimate of severity. No ischemia.   PHYSICAL EXAM  Temp:  [97.7 F (36.5 C)-98.7 F (37.1 C)] 98.7 F (37.1 C) (02/15 2136) Pulse Rate:  [64-96] 85 (02/15 2136) Resp:  [18-20] 18 (02/15 2136) BP: (130-213)/(60-105) 145/69 (02/15 2136) SpO2:  [95 %-97 %] 96 % (02/15 2136)  General - Well nourished, well developed, in  no apparent distress.  Ophthalmologic - Sharp disc margins OU.   Cardiovascular - Regular rate and rhythm.  Mental Status -  Level of arousal and orientation to time, place, and person were intact. Language including expression, naming, repetition, comprehension was assessed and found intact. Attention span and concentration were normal. Fund of Knowledge was assessed and was intact.  Cranial Nerves II - XII - II - Visual field intact OU. III, IV, VI - Extraocular movements intact. V - Facial sensation intact bilaterally. VII - Facial movement intact bilaterally. VIII - Hearing & vestibular intact bilaterally. X - Palate elevates symmetrically. XI - Chin turning & shoulder shrug intact bilaterally. XII - Tongue protrusion intact.  Motor Strength - The patient's strength was normal in all extremities and pronator drift was absent.  Bulk was normal and fasciculations were absent.   Motor Tone - Muscle tone was assessed at the neck and appendages and was normal.  Reflexes - The patient's reflexes were 1+ in all extremities and he had no pathological reflexes.  Sensory - Light touch, temperature/pinprick were assessed and were symmetrical.    Coordination - The patient had normal movements in the hands with no ataxia or dysmetria.  Tremor was absent.  Gait and Station - deferred   ASSESSMENT/PLAN [name] is a 61 y.o. male with history of hypertension presenting with right-sided weakness 3 days. He did not receive IV t-PA due to delay in arrival.   StrokeA:  left ACA territory infarcts embolic secondary to unclear source, favor left ICA/CCA soft plaque and calcification with 50% stenosis over cardioembolic source  Resultant  right LE slightly weakness  CT of head. No acute abnormality   MRI  left frontoparietal/ACA territory infarcts.  MRA head  occluded colossal marginal branch of the azygos ACA  MRA neck  50% stenosis B ICAs   CTA neck 50% left ICA/CCA  stenosis with calcification and soft plaque  Recommend vascular surgery consultation for left ICA stenosis 50% with calcification and soft plaques  2D Echo  EF 40-45% with diffuse hypokinesis  Stress test global hypokinesis with EF 34%  Lower extremity Dopplers no DVT  LDL 109  HgbA1c 6.4  Lovenox 40 mg sq daily for VTE prophylaxis Diet Heart Room service appropriate? Yes; Fluid consistency: Thin  No antithrombotics prior to admission, now on aspirin 325 mg daily.   Patient counseled to be compliant with his antithrombotic medications  Ongoing aggressive stroke risk factor management  Therapy recommendations:  No PT  Disposition:  pending   Carotid artery stenosis  Left ICA/CCA 50% stenosis with calcification soft plaques  Feels most likely the cause for stroke  Vascular surgery plans left CEA tomorrow  Cardiomyopathy  EF 40-45% with diffuse hypokinesis  Cardiology on board  Stress test showed global hypokinesis with EF 34%  Recommend 30 day cardiac event monitoring as outpt to rule out afib  Hypertension  Unstable Permissive hypertension (OK if < 220/120) but gradually normalize in 5-7 days Long-term BP goal normotensive  Hyperlipidemia  Home  meds:  No statin  LDL 109, goal < 70  Lipitor 40 added  Continue statin at discharge  Other Stroke Risk Factors  UDS negative  Obesity, Body mass index is 36.08 kg/m., recommend weight loss, diet and exercise as appropriate   Other Active Problems    Hospital day # 2  Neurology will sign off. Please call with questions. [facility] Thanks for the consult.   Marvel PlanJindong Tishia Maestre, MD PhD Stroke Neurology 10/15/2016 11:00 PM   To contact Stroke Continuity provider, please refer to [email or web address]. After hours, contact General Neurology

## 2016-10-15 NOTE — Progress Notes (Signed)
PROGRESS NOTE                                                                                                                                                                                                             Patient Demographics:    Jason Melendez, is a 61 y.o. male, DOB - July 02, 1956, ZOX:[id number]  Admit date - 10/12/2016   Admitting Physician Clydie Braun, MD  Outpatient Primary MD for the patient is No PCP Per Patient  LOS - 2   No chief complaint on file.      Brief Narrative  61 year old male with history of hypertension, presents with right-sided weakness, workup significant for acute left frontoparietal CVA.   Subjective:    Jason Melendez today has, No headache, No chest pain, No abdominal pain - Denies any further deficits or weeknesss..  Assessment  & Plan :    Principal Problem:   CVA (cerebral vascular accident) (HCC) Active Problems:   HTN (hypertension)   HLD (hyperlipidemia)   Obesity (BMI 30.0-34.9)   Stenosis of left carotid artery   Cardiomyopathy (HCC)  Acute CVA  - MRI brain significant for left frontoparietal acute CVA . - MRA head with Occluded mid to distal callosomarginal branch of th eanterior cerebral artery. Predominately azygos ACA. - MRA NECK: 50% stenosis bilateral proximal internal carotid artery - LDL is 109, started on pravastatin - CTA of neck with 50% left ICA/CCA stenosis with calcification and soft plaques, vascular surgery consulted per neuro recommendation, will  need left CEA as  left carotid  stenosis, Went for stress test today for preoperative cardiac evaluation -2-D echo with EF 40-45% with diffuse hypokinesis, cardiology consulted for further evaluation -Doppler with no evidence of DVT - Continue with full dose aspirin  Hypertension - Allow for permissive hypertension, with aim gradual control, significantly elevated today at all procedure time, will increase Coreg dose, as well as  started on losartan per cardiology  Chronic systolic CHF - EF at 40-45% and diffuse hypokinesis, patient went for Myoview perfusion stress test today to clear him for carotid surgery tomorrow, results are pending  Hyperlipidemia - Patient reports he stopped taking statin at home, resumed on atorvastatin, LDL of 109  Code Status : Full   Family Communication  : Wife and daughter at bedside   Disposition Plan  : Pending  PT evaluation   Consults  :  neurology  Procedures  : none  DVT Prophylaxis  :  Lovenox   Lab Results  Component Value Date   PLT 132 (L) 10/15/2016    Antibiotics  :    Anti-infectives    None        Objective:   Vitals:   10/15/16 1044 10/15/16 1107 10/15/16 1109 10/15/16 1111  BP: (!) 191/105 (!) 209/105 (!) 213/99 (!) 210/95  Pulse:      Resp:      Temp:      TempSrc:      SpO2:      Weight:      Height:        Wt Readings from Last 3 Encounters:  10/13/16 120.7 kg (266 lb)  10/12/16 122.5 kg (270 lb)  10/29/15 109.8 kg (242 lb)    No intake or output data in the 24 hours ending 10/15/16 1413   Physical Exam  Awake Alert, Oriented X 3, No new F.N deficits, Normal affect [place].AT,PERRAL Supple Neck,No JVD, Symmetrical Chest wall movement, Good air movement bilaterally, CTAB RRR,No Gallops,Rubs or new Murmurs, No Parasternal Heave +ve B.Sounds, Abd Soft, No tenderness,No rebound - guarding or rigidity. No Cyanosis, Clubbing or edema,No deficits    Data Review:    CBC  Recent Labs Lab 10/12/16 1949 10/13/16 0217 10/14/16 0553 10/15/16 0250  WBC 6.4 8.0 7.6 6.3  HGB 14.8 14.1 14.4 13.0  HCT 44.5 42.4 44.3 40.2  PLT 145* 137* 140* 132*  MCV 91.2 91.8 92.7 92.0  MCH 30.3 30.5 30.1 29.7  MCHC 33.3 33.3 32.5 32.3  RDW 13.1 13.1 13.4 13.1  LYMPHSABS 2.4 2.1  --   --   MONOABS 0.4 0.4  --   --   EOSABS 0.1 0.1  --   --   BASOSABS 0.0 0.0  --   --     Chemistries   Recent Labs Lab 10/12/16 1949 10/12/16 2322  10/13/16 0217 10/14/16 0553 10/15/16 0250  NA 141  --  140 139 139  K 3.9  --  3.8 4.0 3.7  CL 106  --  105 105 105  CO2 25  --  24 22 27   GLUCOSE 117*  --  153* 137* 142*  BUN 12  --  10 9 11   CREATININE 1.01  --  1.05 1.02 1.06  CALCIUM 9.5  --  9.1 9.0 8.9  AST  --  34  --   --   --   ALT  --  18  --   --   --   ALKPHOS  --  34*  --   --   --   BILITOT  --  0.9  --   --   --    ------------------------------------------------------------------------------------------------------------------  Recent Labs  10/13/16 0217  CHOL 176  HDL 26*  LDLCALC 109*  TRIG 206*  CHOLHDL 6.8    Lab Results  Component Value Date   HGBA1C 6.4 (H) 10/13/2016   ------------------------------------------------------------------------------------------------------------------ No results for input(s): TSH, T4TOTAL, T3FREE, THYROIDAB in the last 72 hours.  Invalid input(s): FREET3 ------------------------------------------------------------------------------------------------------------------ No results for input(s): VITAMINB12, FOLATE, FERRITIN, TIBC, IRON, RETICCTPCT in the last 72 hours.  Coagulation profile  Recent Labs Lab 10/12/16 2058  INR 1.05     Recent Labs  10/12/16 2058  DDIMER 0.89*    Cardiac Enzymes No results for input(s): CKMB, TROPONINI, MYOGLOBIN in the last 168 hours.  Invalid input(s): CK ------------------------------------------------------------------------------------------------------------------  Component Value Date/Time   BNP 27.0 10/12/2016 1949    Inpatient Medications  Scheduled Meds: . aspirin  325 mg Oral Daily  . atorvastatin  40 mg Oral q1800  . carvedilol  6.25 mg Oral BID WC  . enoxaparin (LOVENOX) injection  40 mg Subcutaneous Q24H  . losartan  25 mg Oral Daily   Continuous Infusions: . sodium chloride 20 mL/hr at 10/12/16 2111  . sodium chloride 75 mL/hr at 10/14/16 0517   PRN Meds:.acetaminophen **OR** acetaminophen  (TYLENOL) oral liquid 160 mg/5 mL **OR** acetaminophen, senna-docusate  Micro Results No results found for this or any previous visit (from the past 240 hour(s)).  Radiology Reports Dg Chest 2 View  Result Date: 10/12/2016 CLINICAL DATA:  Initial evaluation for acute right-sided chest pain. EXAM: CHEST  2 VIEW COMPARISON:  None. FINDINGS: The cardiac and mediastinal silhouettes are within normal limits. The lungs are normally inflated. No airspace consolidation, pleural effusion, or pulmonary edema is identified. There is no pneumothorax. No acute osseous abnormality identified. IMPRESSION: No active cardiopulmonary disease. Electronically Signed   By: Rise MuBenjamin  McClintock M.D.   On: 10/12/2016 21:33   Ct Head Wo Contrast  Result Date: 10/12/2016 CLINICAL DATA:  Right leg and arm weakness.  Hypertension. EXAM: CT HEAD WITHOUT CONTRAST TECHNIQUE: Contiguous axial images were obtained from the base of the skull through the vertex without intravenous contrast. COMPARISON:  None. FINDINGS: Brain: No mass lesion, intraparenchymal hemorrhage or extra-axial collection. No evidence of acute cortical infarct. Brain parenchyma and CSF-containing spaces are normal for age. Left parafalcine arachnoid cyst. Vascular: No hyperdense vessel or unexpected calcification. Skull: Normal visualized skull base, calvarium and extracranial soft tissues. Sinuses/Orbits: No sinus fluid levels or advanced mucosal thickening. No mastoid effusion. Normal orbits. IMPRESSION: No acute intracranial abnormality. Electronically Signed   By: Deatra RobinsonKevin  Herman M.D.   On: 10/12/2016 21:12   Ct Angio Neck W Or Wo Contrast  Result Date: 10/13/2016 CLINICAL DATA:  61 year old male with left ACA infarct discovered during evaluation of confusion, right side weakness for 2 days. Initial encounter. EXAM: CT ANGIOGRAPHY NECK TECHNIQUE: Multidetector CT imaging of the neck was performed using the standard protocol during bolus administration of  intravenous contrast. Multiplanar CT image reconstructions and MIPs were obtained to evaluate the vascular anatomy. Carotid stenosis measurements (when applicable) are obtained utilizing NASCET criteria, using the distal internal carotid diameter as the denominator. CONTRAST:  50 mL Isovue 370 COMPARISON:  Brain MRI and MRA head and neck 0050 hours today. FINDINGS: Skeleton: Carious posterior dentition. No acute osseous abnormality identified. Visualized paranasal sinuses and mastoids are stable and well pneumatized. Upper chest: Superior mediastinal lipomatosis. No superior mediastinal lymphadenopathy. Negative visualized lung parenchyma. Other neck: Negative thyroid, larynx, pharynx, parapharyngeal spaces, retropharyngeal space, sublingual space, submandibular glands and parotid glands. No cervical lymphadenopathy. Aortic arch: 3 vessel arch configuration. Mild distal arch calcified atherosclerosis. Right carotid system: No brachiocephalic artery or right CCA origin stenosis. Mildly tortuous proximal right CCA. Soft plaque along the anterior right CCA without stenosis (series 401, image 74). Soft and calcified plaque at the right carotid bifurcation with no proximal right ICA stenosis (series 404, image 93). Severely tortuous mid cervical right ICA. Otherwise negative right ICA to the level of the distal petrous segment. Left carotid system: No left CCA origin stenosis. Tortuous proximal left CCA. Minimal soft plaque proximal to the left carotid bifurcation. At the left carotid bifurcation there is low-density soft and calcified plaque affecting the left ICA origin and bulb. Subsequent  stenosis is up to 50 % with respect to the distal vessel in the proximal left ICA. Mildly tortuous more distal cervical left ICA. Visible left siphon is patent with mild calcified plaque. Vertebral arteries:No proximal right subclavian artery stenosis. Right vertebral artery origin appears normal. There is a late entry of the right  vertebral artery into the cervical transverse foramen. The right vertebral artery is mildly dominant. No right vertebral artery stenosis to the vertebrobasilar junction. Visible basilar artery is unremarkable. No proximal left subclavian artery stenosis despite mild plaque. The left vertebral artery origin is within normal limits. Tortuous left V1 segment. The left vertebral artery is mildly non dominant and patent to the vertebrobasilar junction without stenosis. Normal left PICA origin. IMPRESSION: 1. Soft and calcified bilateral carotid atherosclerosis in the neck with no hemodynamically significant stenosis; 50% proximal left ICA stenosis. 2. Dominant right vertebral artery with no definite vertebral artery stenosis. 3. Carious posterior dentition. Electronically Signed   By: Odessa Fleming M.D.   On: 10/13/2016 13:01   Mr Maxine Glenn Head Wo Contrast  Result Date: 10/13/2016 CLINICAL DATA:  RIGHT-sided weakness for 2 days. Hypertensive, RIGHT-sided chest pain and elevated D-dimer. Confusion. Assess stroke. EXAM: MRI HEAD WITHOUT CONTRAST MRA HEAD WITHOUT CONTRAST MRA NECK WITHOUT AND WITH CONTRAST TECHNIQUE: Multiplanar, multiecho pulse sequences of the brain and surrounding structures were obtained without intravenous contrast. Angiographic images of the Circle of Willis were obtained using MRA technique without intravenous contrast. Angiographic images of the neck were obtained using MRA technique without and with intravenous contrast. Carotid stenosis measurements (when applicable) are obtained utilizing NASCET criteria, using the distal internal carotid diameter as the denominator. CONTRAST:  20mL MULTIHANCE GADOBENATE DIMEGLUMINE 529 MG/ML IV SOLN COMPARISON:  CT HEAD October 12, 2016 FINDINGS: MRI HEAD FINDINGS BRAIN: 2 x 3 cm area reduced diffusion LEFT mesial posterior frontal lobe. subcentimeter foci of reduced diffusion posterior to the dominant area, within LEFT mesial frontal and parietal lobes, with  corresponding low ADC values and slight bright FLAIR T2 signal. No susceptibility artifact to suggest hemorrhage. A few scattered subcentimeter supratentorial white matter FLAIR T2 hyperintensities. The ventricles and sulci are normal for patient's age. No suspicious parenchymal signal, masses or mass effect. No abnormal extra-axial fluid collections. VASCULAR: Normal major intracranial vascular flow voids present at skull base. SKULL AND UPPER CERVICAL SPINE: No abnormal sellar expansion. No suspicious calvarial bone marrow signal. Craniocervical junction maintained. SINUSES/ORBITS: The mastoid air-cells and included paranasal sinuses are well-aerated. The included ocular globes and orbital contents are non-suspicious. OTHER: Subcentimeter lymph nodes bilateral parotid glands. MRA HEAD FINDINGS ANTERIOR CIRCULATION: Normal flow related enhancement of the included cervical, petrous, cavernous and supraclinoid internal carotid arteries. Patent anterior communicating artery. Predominately azygos ACA, occluded branch of the callosum marginal mid to distal segment. Normal flow related enhancement of the middle cerebral arteries, including distal segments. No large vessel occlusion, high-grade stenosis, abnormal luminal irregularity, aneurysm. POSTERIOR CIRCULATION: Codominant vertebral artery's. Basilar artery is patent, with normal flow related enhancement of the main branch vessels. Normal flow related enhancement of the posterior cerebral arteries. No large vessel occlusion, high-grade stenosis, abnormal luminal irregularity, aneurysm. ANATOMIC VARIANTS: None. MRA NECK FINDINGS ANTERIOR CIRCULATION: The common carotid arteries are widely patent bilaterally. The carotid bifurcation are patent bilaterally. Within 5 mm of the bilateral internal carotid artery origins are 2 mm segments of 50% stenosis by NASCET criteria. No flow limiting stenosis of the cervical internal carotid arteries. POSTERIOR CIRCULATION: Bilateral  vertebral arteries are patent to the vertebrobasilar  junction. No evidence for atherosclerosis or flow limiting stenosis. Source images and MIP image were reviewed. IMPRESSION: MRI HEAD: Acute nonhemorrhagic LEFT frontoparietal/ACA territory infarcts. Mild chronic small vessel ischemic disease. MRA HEAD: Occluded mid to distal callosomarginal branch of the anterior cerebral artery. Predominately azygos ACA. MRA NECK: 50% stenosis bilateral proximal internal carotid artery origins. Electronically Signed   By: Awilda Metro M.D.   On: 10/13/2016 02:35   Mr Angiogram Neck W Or Wo Contrast  Result Date: 10/13/2016 CLINICAL DATA:  RIGHT-sided weakness for 2 days. Hypertensive, RIGHT-sided chest pain and elevated D-dimer. Confusion. Assess stroke. EXAM: MRI HEAD WITHOUT CONTRAST MRA HEAD WITHOUT CONTRAST MRA NECK WITHOUT AND WITH CONTRAST TECHNIQUE: Multiplanar, multiecho pulse sequences of the brain and surrounding structures were obtained without intravenous contrast. Angiographic images of the Circle of Willis were obtained using MRA technique without intravenous contrast. Angiographic images of the neck were obtained using MRA technique without and with intravenous contrast. Carotid stenosis measurements (when applicable) are obtained utilizing NASCET criteria, using the distal internal carotid diameter as the denominator. CONTRAST:  20mL MULTIHANCE GADOBENATE DIMEGLUMINE 529 MG/ML IV SOLN COMPARISON:  CT HEAD October 12, 2016 FINDINGS: MRI HEAD FINDINGS BRAIN: 2 x 3 cm area reduced diffusion LEFT mesial posterior frontal lobe. subcentimeter foci of reduced diffusion posterior to the dominant area, within LEFT mesial frontal and parietal lobes, with corresponding low ADC values and slight bright FLAIR T2 signal. No susceptibility artifact to suggest hemorrhage. A few scattered subcentimeter supratentorial white matter FLAIR T2 hyperintensities. The ventricles and sulci are normal for patient's age. No  suspicious parenchymal signal, masses or mass effect. No abnormal extra-axial fluid collections. VASCULAR: Normal major intracranial vascular flow voids present at skull base. SKULL AND UPPER CERVICAL SPINE: No abnormal sellar expansion. No suspicious calvarial bone marrow signal. Craniocervical junction maintained. SINUSES/ORBITS: The mastoid air-cells and included paranasal sinuses are well-aerated. The included ocular globes and orbital contents are non-suspicious. OTHER: Subcentimeter lymph nodes bilateral parotid glands. MRA HEAD FINDINGS ANTERIOR CIRCULATION: Normal flow related enhancement of the included cervical, petrous, cavernous and supraclinoid internal carotid arteries. Patent anterior communicating artery. Predominately azygos ACA, occluded branch of the callosum marginal mid to distal segment. Normal flow related enhancement of the middle cerebral arteries, including distal segments. No large vessel occlusion, high-grade stenosis, abnormal luminal irregularity, aneurysm. POSTERIOR CIRCULATION: Codominant vertebral artery's. Basilar artery is patent, with normal flow related enhancement of the main branch vessels. Normal flow related enhancement of the posterior cerebral arteries. No large vessel occlusion, high-grade stenosis, abnormal luminal irregularity, aneurysm. ANATOMIC VARIANTS: None. MRA NECK FINDINGS ANTERIOR CIRCULATION: The common carotid arteries are widely patent bilaterally. The carotid bifurcation are patent bilaterally. Within 5 mm of the bilateral internal carotid artery origins are 2 mm segments of 50% stenosis by NASCET criteria. No flow limiting stenosis of the cervical internal carotid arteries. POSTERIOR CIRCULATION: Bilateral vertebral arteries are patent to the vertebrobasilar junction. No evidence for atherosclerosis or flow limiting stenosis. Source images and MIP image were reviewed. IMPRESSION: MRI HEAD: Acute nonhemorrhagic LEFT frontoparietal/ACA territory infarcts. Mild  chronic small vessel ischemic disease. MRA HEAD: Occluded mid to distal callosomarginal branch of the anterior cerebral artery. Predominately azygos ACA. MRA NECK: 50% stenosis bilateral proximal internal carotid artery origins. Electronically Signed   By: Awilda Metro M.D.   On: 10/13/2016 02:35   Mr Brain Wo Contrast  Result Date: 10/13/2016 CLINICAL DATA:  RIGHT-sided weakness for 2 days. Hypertensive, RIGHT-sided chest pain and elevated D-dimer. Confusion. Assess stroke. EXAM: MRI HEAD  WITHOUT CONTRAST MRA HEAD WITHOUT CONTRAST MRA NECK WITHOUT AND WITH CONTRAST TECHNIQUE: Multiplanar, multiecho pulse sequences of the brain and surrounding structures were obtained without intravenous contrast. Angiographic images of the Circle of Willis were obtained using MRA technique without intravenous contrast. Angiographic images of the neck were obtained using MRA technique without and with intravenous contrast. Carotid stenosis measurements (when applicable) are obtained utilizing NASCET criteria, using the distal internal carotid diameter as the denominator. CONTRAST:  20mL MULTIHANCE GADOBENATE DIMEGLUMINE 529 MG/ML IV SOLN COMPARISON:  CT HEAD October 12, 2016 FINDINGS: MRI HEAD FINDINGS BRAIN: 2 x 3 cm area reduced diffusion LEFT mesial posterior frontal lobe. subcentimeter foci of reduced diffusion posterior to the dominant area, within LEFT mesial frontal and parietal lobes, with corresponding low ADC values and slight bright FLAIR T2 signal. No susceptibility artifact to suggest hemorrhage. A few scattered subcentimeter supratentorial white matter FLAIR T2 hyperintensities. The ventricles and sulci are normal for patient's age. No suspicious parenchymal signal, masses or mass effect. No abnormal extra-axial fluid collections. VASCULAR: Normal major intracranial vascular flow voids present at skull base. SKULL AND UPPER CERVICAL SPINE: No abnormal sellar expansion. No suspicious calvarial bone marrow  signal. Craniocervical junction maintained. SINUSES/ORBITS: The mastoid air-cells and included paranasal sinuses are well-aerated. The included ocular globes and orbital contents are non-suspicious. OTHER: Subcentimeter lymph nodes bilateral parotid glands. MRA HEAD FINDINGS ANTERIOR CIRCULATION: Normal flow related enhancement of the included cervical, petrous, cavernous and supraclinoid internal carotid arteries. Patent anterior communicating artery. Predominately azygos ACA, occluded branch of the callosum marginal mid to distal segment. Normal flow related enhancement of the middle cerebral arteries, including distal segments. No large vessel occlusion, high-grade stenosis, abnormal luminal irregularity, aneurysm. POSTERIOR CIRCULATION: Codominant vertebral artery's. Basilar artery is patent, with normal flow related enhancement of the main branch vessels. Normal flow related enhancement of the posterior cerebral arteries. No large vessel occlusion, high-grade stenosis, abnormal luminal irregularity, aneurysm. ANATOMIC VARIANTS: None. MRA NECK FINDINGS ANTERIOR CIRCULATION: The common carotid arteries are widely patent bilaterally. The carotid bifurcation are patent bilaterally. Within 5 mm of the bilateral internal carotid artery origins are 2 mm segments of 50% stenosis by NASCET criteria. No flow limiting stenosis of the cervical internal carotid arteries. POSTERIOR CIRCULATION: Bilateral vertebral arteries are patent to the vertebrobasilar junction. No evidence for atherosclerosis or flow limiting stenosis. Source images and MIP image were reviewed. IMPRESSION: MRI HEAD: Acute nonhemorrhagic LEFT frontoparietal/ACA territory infarcts. Mild chronic small vessel ischemic disease. MRA HEAD: Occluded mid to distal callosomarginal branch of the anterior cerebral artery. Predominately azygos ACA. MRA NECK: 50% stenosis bilateral proximal internal carotid artery origins. Electronically Signed   By: Awilda Metro M.D.   On: 10/13/2016 02:35      Chinwe Lope M.D on 10/15/2016 at 2:13 PM  Between 7am to 7pm - Pager - [phone]  After 7pm go to [email or web address] - password [facility]  [facility] -  Office  [phone]

## 2016-10-15 NOTE — Plan of Care (Signed)
Problem: Coping: Goal: Ability to verbalize positive feelings about self will improve Outcome: Progressing Pt stated how he is improving after stroke and discussed his feelings to me about how he felt

## 2016-10-15 NOTE — Progress Notes (Signed)
Pt currently getting Myoview for cardiac clearance. I have recommended to the family proceeding with left carotid endarterectomy tomorrow if he passes his cardiac clearance.  I will discuss this with the patient in the morning.   Durene CalWells Donye Dauenhauer

## 2016-10-15 NOTE — Progress Notes (Signed)
This RN spoke with MD [place]- states that patient does not need to be hooked to fluids until after midnight tonight for possible surgery tomr. Will pass on the night RN

## 2016-10-15 NOTE — Progress Notes (Signed)
Progress Note  Patient Name: Jason Melendez Date of Encounter: 10/15/2016  Primary Cardiologist: Mendel RyderH. Erryn Dickison  Subjective   No complaints over night.   Inpatient Medications    Scheduled Meds: . aspirin  325 mg Oral Daily  . atorvastatin  40 mg Oral q1800  . carvedilol  3.125 mg Oral BID WC  . enoxaparin (LOVENOX) injection  40 mg Subcutaneous Q24H  . losartan  25 mg Oral Daily  . regadenoson  0.4 mg Intravenous Once   Continuous Infusions: . sodium chloride 20 mL/hr at 10/12/16 2111  . sodium chloride 75 mL/hr at 10/14/16 0517   PRN Meds: acetaminophen **OR** acetaminophen (TYLENOL) oral liquid 160 mg/5 mL **OR** acetaminophen, senna-docusate   Vital Signs    Vitals:   10/15/16 0952 10/15/16 1005 10/15/16 1030 10/15/16 1044  BP: (!) 185/98 (!) 192/97 (!) 200/100 (!) 191/105  Pulse:      Resp:      Temp:      TempSrc:      SpO2:      Weight:      Height:       No intake or output data in the 24 hours ending 10/15/16 1057 Filed Weights   10/12/16 1937 10/13/16 0236  Weight: 265 lb (120.2 kg) 266 lb (120.7 kg)    Telemetry    Not on telemetry - Personally Reviewed  ECG    No new tracing - Personally Reviewed  Physical Exam  Obese. Seen in nuclear lab. No distress. BP elevated. GEN: No acute distress.   Neck: No JVD Cardiac: RRR, no murmurs, rubs, or gallops.  Respiratory: Clear to auscultation bilaterally. GI: Soft, nontender, non-distended  MS: No edema; No deformity. Neuro:  Nonfocal  Psych: Normal affect   Labs    Chemistry Recent Labs Lab 10/12/16 2322 10/13/16 0217 10/14/16 0553 10/15/16 0250  NA  --  140 139 139  K  --  3.8 4.0 3.7  CL  --  105 105 105  CO2  --  24 22 27   GLUCOSE  --  153* 137* 142*  BUN  --  10 9 11   CREATININE  --  1.05 1.02 1.06  CALCIUM  --  9.1 9.0 8.9  PROT 6.0*  --   --   --   ALBUMIN 3.5  --   --   --   AST 34  --   --   --   ALT 18  --   --   --   ALKPHOS 34*  --   --   --   BILITOT 0.9  --   --    --   GFRNONAA  --  >60 >60 >60  GFRAA  --  >60 >60 >60  ANIONGAP  --  11 12 7      Hematology Recent Labs Lab 10/13/16 0217 10/14/16 0553 10/15/16 0250  WBC 8.0 7.6 6.3  RBC 4.62 4.78 4.37  HGB 14.1 14.4 13.0  HCT 42.4 44.3 40.2  MCV 91.8 92.7 92.0  MCH 30.5 30.1 29.7  MCHC 33.3 32.5 32.3  RDW 13.1 13.4 13.1  PLT 137* 140* 132*    Cardiac EnzymesNo results for input(s): TROPONINI in the last 168 hours.  Recent Labs Lab 10/12/16 2044  TROPIPOC 0.02     BNP Recent Labs Lab 10/12/16 1949  BNP 27.0     DDimer  Recent Labs Lab 10/12/16 2058  DDIMER 0.89*     Radiology    Ct Angio Neck W  Or Wo Contrast  Result Date: 10/13/2016 CLINICAL DATA:  61 year old male with left ACA infarct discovered during evaluation of confusion, right side weakness for 2 days. Initial encounter. EXAM: CT ANGIOGRAPHY NECK TECHNIQUE: Multidetector CT imaging of the neck was performed using the standard protocol during bolus administration of intravenous contrast. Multiplanar CT image reconstructions and MIPs were obtained to evaluate the vascular anatomy. Carotid stenosis measurements (when applicable) are obtained utilizing NASCET criteria, using the distal internal carotid diameter as the denominator. CONTRAST:  50 mL Isovue 370 COMPARISON:  Brain MRI and MRA head and neck 0050 hours today. FINDINGS: Skeleton: Carious posterior dentition. No acute osseous abnormality identified. Visualized paranasal sinuses and mastoids are stable and well pneumatized. Upper chest: Superior mediastinal lipomatosis. No superior mediastinal lymphadenopathy. Negative visualized lung parenchyma. Other neck: Negative thyroid, larynx, pharynx, parapharyngeal spaces, retropharyngeal space, sublingual space, submandibular glands and parotid glands. No cervical lymphadenopathy. Aortic arch: 3 vessel arch configuration. Mild distal arch calcified atherosclerosis. Right carotid system: No brachiocephalic artery or right CCA  origin stenosis. Mildly tortuous proximal right CCA. Soft plaque along the anterior right CCA without stenosis (series 401, image 74). Soft and calcified plaque at the right carotid bifurcation with no proximal right ICA stenosis (series 404, image 93). Severely tortuous mid cervical right ICA. Otherwise negative right ICA to the level of the distal petrous segment. Left carotid system: No left CCA origin stenosis. Tortuous proximal left CCA. Minimal soft plaque proximal to the left carotid bifurcation. At the left carotid bifurcation there is low-density soft and calcified plaque affecting the left ICA origin and bulb. Subsequent stenosis is up to 50 % with respect to the distal vessel in the proximal left ICA. Mildly tortuous more distal cervical left ICA. Visible left siphon is patent with mild calcified plaque. Vertebral arteries:No proximal right subclavian artery stenosis. Right vertebral artery origin appears normal. There is a late entry of the right vertebral artery into the cervical transverse foramen. The right vertebral artery is mildly dominant. No right vertebral artery stenosis to the vertebrobasilar junction. Visible basilar artery is unremarkable. No proximal left subclavian artery stenosis despite mild plaque. The left vertebral artery origin is within normal limits. Tortuous left V1 segment. The left vertebral artery is mildly non dominant and patent to the vertebrobasilar junction without stenosis. Normal left PICA origin. IMPRESSION: 1. Soft and calcified bilateral carotid atherosclerosis in the neck with no hemodynamically significant stenosis; 50% proximal left ICA stenosis. 2. Dominant right vertebral artery with no definite vertebral artery stenosis. 3. Carious posterior dentition. Electronically Signed   By: Odessa Fleming M.D.   On: 10/13/2016 13:01    Cardiac Studies   Echocardiogram 10/13/16: Study Conclusions  - Left ventricle: The cavity size was mildly dilated. There was   mild  focal basal hypertrophy of the septum. Systolic function was   mildly to moderately reduced. The estimated ejection fraction was   in the range of 40% to 45%. Diffuse hypokinesis. There was an   increased relative contribution of atrial contraction to   ventricular filling. Doppler parameters are consistent with   abnormal left ventricular relaxation (grade 1 diastolic   dysfunction). - Aortic valve: Trileaflet; mildly thickened, mildly calcified   leaflets. - Left atrium: The atrium was mildly dilated. - Pulmonary arteries: Systolic pressure could not be accurately   estimated.  Patient Profile     61 y.o. 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 Severe hypertension never previously treated.  Assessment & Plan    1. Chronic HFrEF, 40-45%. Etiology suspected to be untreated hypertension. Having myoview perfusion stress today to clear for carotid surgery tomorrow. 2. Acute left brain CVA, ? Related to left common carotic ulcerated plaque vs other source. Unclear etiology. Will discuss with colleagues. If not carotid related, needs to be on telemetry, will need 30 day monitor or Loop, and possibly a TEE. 3. Severe BP elevation, ARB started today. Already on low dose carvedilol. Both have room for optimization when given approval by neurology.  Signed, Lesleigh Noe, MD  10/15/2016, 10:57 AM

## 2016-10-15 NOTE — Progress Notes (Signed)
Progress Note  Patient Name: Jason Melendez Date of Encounter: 10/15/2016  Primary Cardiologist: New to Dr. Katrinka BlazingSmith  Subjective   Seen in nuc med. Anxious about stress test. No chest pain or dyspnea. BP in 190-200s/90-100s. Given scheduled BB and ARB and stress test performed after 1 hours of medication. Discussed with Dr. Katrinka BlazingSmith.   Inpatient Medications    Scheduled Meds: . aspirin  325 mg Oral Daily  . atorvastatin  40 mg Oral q1800  . carvedilol  3.125 mg Oral BID WC  . enoxaparin (LOVENOX) injection  40 mg Subcutaneous Q24H  . losartan  25 mg Oral Daily  . regadenoson  0.4 mg Intravenous Once   Continuous Infusions: . sodium chloride 20 mL/hr at 10/12/16 2111  . sodium chloride 75 mL/hr at 10/14/16 0517   PRN Meds: acetaminophen **OR** acetaminophen (TYLENOL) oral liquid 160 mg/5 mL **OR** acetaminophen, senna-docusate   Vital Signs    Vitals:   10/15/16 0043 10/15/16 0507 10/15/16 0952 10/15/16 1005  BP: (!) 181/60 (!) 162/86 (!) 185/98 (!) 192/97  Pulse: 64 74    Resp: 20 20    Temp: 97.7 F (36.5 C) 98.1 F (36.7 C)    TempSrc: Oral Oral    SpO2: 95% 97%    Weight:      Height:       No intake or output data in the 24 hours ending 10/15/16 1029 Filed Weights   10/12/16 1937 10/13/16 0236  Weight: 265 lb (120.2 kg) 266 lb (120.7 kg)    Telemetry    Unable to review as pt seen in nuc med.   ECG      Physical Exam   GEN: No acute distress.   Neck: No JVD Cardiac: RRR, no murmurs, rubs, or gallops.  Respiratory: Clear to auscultation bilaterally. GI: Soft, nontender, non-distended  MS: No edema; No deformity. Neuro:  Nonfocal  Psych: Normal affect   Labs    Chemistry Recent Labs Lab 10/12/16 2322 10/13/16 0217 10/14/16 0553 10/15/16 0250  NA  --  140 139 139  K  --  3.8 4.0 3.7  CL  --  105 105 105  CO2  --  24 22 27   GLUCOSE  --  153* 137* 142*  BUN  --  10 9 11   CREATININE  --  1.05 1.02 1.06  CALCIUM  --  9.1 9.0 8.9    PROT 6.0*  --   --   --   ALBUMIN 3.5  --   --   --   AST 34  --   --   --   ALT 18  --   --   --   ALKPHOS 34*  --   --   --   BILITOT 0.9  --   --   --   GFRNONAA  --  >60 >60 >60  GFRAA  --  >60 >60 >60  ANIONGAP  --  11 12 7      Hematology Recent Labs Lab 10/13/16 0217 10/14/16 0553 10/15/16 0250  WBC 8.0 7.6 6.3  RBC 4.62 4.78 4.37  HGB 14.1 14.4 13.0  HCT 42.4 44.3 40.2  MCV 91.8 92.7 92.0  MCH 30.5 30.1 29.7  MCHC 33.3 32.5 32.3  RDW 13.1 13.4 13.1  PLT 137* 140* 132*    Cardiac EnzymesNo results for input(s): TROPONINI in the last 168 hours.  Recent Labs Lab 10/12/16 2044  TROPIPOC 0.02     BNP Recent Labs Lab 10/12/16  1949  BNP 27.0     DDimer  Recent Labs Lab 10/12/16 2058  DDIMER 0.89*     Radiology    Ct Angio Neck W Or Wo Contrast  Result Date: 10/13/2016 CLINICAL DATA:  61 year old male with left ACA infarct discovered during evaluation of confusion, right side weakness for 2 days. Initial encounter. EXAM: CT ANGIOGRAPHY NECK TECHNIQUE: Multidetector CT imaging of the neck was performed using the standard protocol during bolus administration of intravenous contrast. Multiplanar CT image reconstructions and MIPs were obtained to evaluate the vascular anatomy. Carotid stenosis measurements (when applicable) are obtained utilizing NASCET criteria, using the distal internal carotid diameter as the denominator. CONTRAST:  50 mL Isovue 370 COMPARISON:  Brain MRI and MRA head and neck 0050 hours today. FINDINGS: Skeleton: Carious posterior dentition. No acute osseous abnormality identified. Visualized paranasal sinuses and mastoids are stable and well pneumatized. Upper chest: Superior mediastinal lipomatosis. No superior mediastinal lymphadenopathy. Negative visualized lung parenchyma. Other neck: Negative thyroid, larynx, pharynx, parapharyngeal spaces, retropharyngeal space, sublingual space, submandibular glands and parotid glands. No cervical  lymphadenopathy. Aortic arch: 3 vessel arch configuration. Mild distal arch calcified atherosclerosis. Right carotid system: No brachiocephalic artery or right CCA origin stenosis. Mildly tortuous proximal right CCA. Soft plaque along the anterior right CCA without stenosis (series 401, image 74). Soft and calcified plaque at the right carotid bifurcation with no proximal right ICA stenosis (series 404, image 93). Severely tortuous mid cervical right ICA. Otherwise negative right ICA to the level of the distal petrous segment. Left carotid system: No left CCA origin stenosis. Tortuous proximal left CCA. Minimal soft plaque proximal to the left carotid bifurcation. At the left carotid bifurcation there is low-density soft and calcified plaque affecting the left ICA origin and bulb. Subsequent stenosis is up to 50 % with respect to the distal vessel in the proximal left ICA. Mildly tortuous more distal cervical left ICA. Visible left siphon is patent with mild calcified plaque. Vertebral arteries:No proximal right subclavian artery stenosis. Right vertebral artery origin appears normal. There is a late entry of the right vertebral artery into the cervical transverse foramen. The right vertebral artery is mildly dominant. No right vertebral artery stenosis to the vertebrobasilar junction. Visible basilar artery is unremarkable. No proximal left subclavian artery stenosis despite mild plaque. The left vertebral artery origin is within normal limits. Tortuous left V1 segment. The left vertebral artery is mildly non dominant and patent to the vertebrobasilar junction without stenosis. Normal left PICA origin. IMPRESSION: 1. Soft and calcified bilateral carotid atherosclerosis in the neck with no hemodynamically significant stenosis; 50% proximal left ICA stenosis. 2. Dominant right vertebral artery with no definite vertebral artery stenosis. 3. Carious posterior dentition. Electronically Signed   By: Odessa Fleming M.D.   On:  10/13/2016 13:01    Cardiac Studies   Echo 10/13/16 LV EF: 40% -   45%  ------------------------------------------------------------------- Indications:      CVA 436.  ------------------------------------------------------------------- History:   Risk factors:  Hypertension. Dyslipidemia.  ------------------------------------------------------------------- Study Conclusions  - Left ventricle: The cavity size was mildly dilated. There was   mild focal basal hypertrophy of the septum. Systolic function was   mildly to moderately reduced. The estimated ejection fraction was   in the range of 40% to 45%. Diffuse hypokinesis. There was an   increased relative contribution of atrial contraction to   ventricular filling. Doppler parameters are consistent with   abnormal left ventricular relaxation (grade 1 diastolic   dysfunction). - Aortic  valve: Trileaflet; mildly thickened, mildly calcified   leaflets. - Left atrium: The atrium was mildly dilated. - Pulmonary arteries: Systolic pressure could not be accurately   estimated.  Patient Profile     Jason Melendez is a 61 y.o. male with past medical history significant for hypertension who presented with a three day history of intermittent right sided weakness. An MRI of the brain showed a left frontoparietal acute CVA. We have been asked to consult for abnormal echocardiogram.   Assessment & Plan    1. Chronic systolic heart failure -EF 40-45% with mildly dilated LV and LA. Mild focal basal hypertrophy of uncertain origin and significance -Likely hypertensive heart disease. Lately his BP running high.  - Continue BB and ACE. Pending stress test.   2. CVA -Acute nonhemorrhagic LEFT frontoparietal/ACA territory Infarcts. No TPA given. Per neurology.   3. Carotid disease - Left carotid stenosis CTA 50 % ICA. ? Left CEA with Dr. Myra Gianotti.   4. Hypertension -blood pressure elevated on presentation. Hydralazine IV given with  subsequent bradycardia and hypotension with worsening of the CVA symptoms indicating possible embolic source of CVA -Permissive hypertension in setting of CVA per neurology. Started BB and ACE.   5. Hyperlipidemia -LDL on 10/13/16 109. Continue lipitor 40 mg  Signed, [place], [place]  10/15/2016, 10:29 AM

## 2016-10-16 ENCOUNTER — Inpatient Hospital Stay (HOSPITAL_COMMUNITY): Payer: 59 | Admitting: Certified Registered Nurse Anesthetist

## 2016-10-16 ENCOUNTER — Encounter (HOSPITAL_COMMUNITY)
Admission: EM | Disposition: A | Discharge status: Home / Self Care | Payer: Self-pay | Source: Home / Self Care | Attending: Internal Medicine

## 2016-10-16 DIAGNOSIS — I6522 Occlusion and stenosis of left carotid artery: Secondary | ICD-10-CM

## 2016-10-16 DIAGNOSIS — I63132 Cerebral infarction due to embolism of left carotid artery: Secondary | ICD-10-CM

## 2016-10-16 DIAGNOSIS — E669 Obesity, unspecified: Secondary | ICD-10-CM

## 2016-10-16 HISTORY — PX: PATCH ANGIOPLASTY: SHX6230

## 2016-10-16 HISTORY — PX: ENDARTERECTOMY: SHX5162

## 2016-10-16 LAB — BASIC METABOLIC PANEL
Anion gap: 8 (ref 5–15)
BUN: 13 mg/dL (ref 6–20)
CO2: 26 mmol/L (ref 22–32)
Calcium: 9.1 mg/dL (ref 8.9–10.3)
Chloride: 106 mmol/L (ref 101–111)
Creatinine, Ser: 1.09 mg/dL (ref 0.61–1.24)
GFR calc Af Amer: >60 mL/min (ref >60)
GFR calc non Af Amer: >60 mL/min (ref >60)
Glucose, Bld: 133 mg/dL — ABNORMAL HIGH (ref 65–99)
Potassium: 4 mmol/L (ref 3.5–5.1)
Sodium: 140 mmol/L (ref 135–145)

## 2016-10-16 LAB — CBC
HCT: 40.9 % (ref 39.0–52.0)
HCT: 42 % (ref 39.0–52.0)
Hemoglobin: 13.4 g/dL (ref 13.0–17.0)
Hemoglobin: 14.2 g/dL (ref 13.0–17.0)
MCH: 29.9 pg (ref 26.0–34.0)
MCH: 30.8 pg (ref 26.0–34.0)
MCHC: 32.8 g/dL (ref 30.0–36.0)
MCHC: 33.8 g/dL (ref 30.0–36.0)
MCV: 91.3 fL (ref 78.0–100.0)
MCV: 91.1 fL (ref 78.0–100.0)
Platelets: 130 10*3/uL — ABNORMAL LOW (ref 150–400)
Platelets: 146 10*3/uL — ABNORMAL LOW (ref 150–400)
RBC: 4.48 MIL/uL (ref 4.22–5.81)
RBC: 4.61 MIL/uL (ref 4.22–5.81)
RDW: 13.1 % (ref 11.5–15.5)
RDW: 13 % (ref 11.5–15.5)
WBC: 6.6 10*3/uL (ref 4.0–10.5)
WBC: 7.8 10*3/uL (ref 4.0–10.5)

## 2016-10-16 LAB — TYPE AND SCREEN
ABO/RH(D): A POS
Antibody Screen: NEGATIVE

## 2016-10-16 LAB — PROTIME-INR
INR: 1.12
Prothrombin Time: 14.5 seconds (ref 11.4–15.2)

## 2016-10-16 LAB — SURGICAL PCR SCREEN
MRSA, PCR: NEGATIVE
Staphylococcus aureus: NEGATIVE

## 2016-10-16 SURGERY — ENDARTERECTOMY, CAROTID
Anesthesia: General | Site: Neck | Laterality: Left

## 2016-10-16 MED ORDER — HYDROMORPHONE HCL 1 MG/ML IJ SOLN
INTRAMUSCULAR | Status: AC
  Administered 2016-10-16: 0.5 mg via INTRAVENOUS
  Filled 2016-10-16: qty 0.5

## 2016-10-16 MED ORDER — OXYCODONE-ACETAMINOPHEN 5-325 MG PO TABS
ORAL_TABLET | ORAL | Status: AC
  Filled 2016-10-16: qty 2

## 2016-10-16 MED ORDER — ALUM & MAG HYDROXIDE-SIMETH 200-200-20 MG/5ML PO SUSP: 15.0000 mL | ORAL | Status: DC | PRN

## 2016-10-16 MED ORDER — ONDANSETRON HCL 4 MG/2ML IJ SOLN: 4.0000 mg | Freq: Four times a day (QID) | INTRAMUSCULAR | Status: DC | PRN

## 2016-10-16 MED ORDER — MORPHINE SULFATE (PF) 2 MG/ML IV SOLN
2.0000 mg | INTRAVENOUS | Status: DC | PRN
  Administered 2016-10-16: 2 mg via INTRAVENOUS
  Filled 2016-10-16: qty 1

## 2016-10-16 MED ORDER — BISACODYL 10 MG RE SUPP: 10.0000 mg | Freq: Every day | RECTAL | Status: DC | PRN

## 2016-10-16 MED ORDER — MIDAZOLAM HCL 2 MG/2ML IJ SOLN
INTRAMUSCULAR | Status: AC
  Filled 2016-10-16: qty 2

## 2016-10-16 MED ORDER — PROTAMINE SULFATE 10 MG/ML IV SOLN
INTRAVENOUS | Status: DC | PRN
  Administered 2016-10-16: 50 mg via INTRAVENOUS

## 2016-10-16 MED ORDER — SUGAMMADEX SODIUM 500 MG/5ML IV SOLN
INTRAVENOUS | Status: DC | PRN
  Administered 2016-10-16: 300 mg via INTRAVENOUS

## 2016-10-16 MED ORDER — PHENYLEPHRINE HCL 10 MG/ML IJ SOLN
INTRAVENOUS | Status: DC | PRN
  Administered 2016-10-16: 25 ug/min via INTRAVENOUS

## 2016-10-16 MED ORDER — CEFAZOLIN IN D5W 1 GM/50ML IV SOLN: 1.0000 g | INTRAVENOUS | Status: DC

## 2016-10-16 MED ORDER — DOCUSATE SODIUM 100 MG PO CAPS
100.0000 mg | ORAL_CAPSULE | Freq: Every day | ORAL | Status: DC
  Administered 2016-10-17 – 2016-10-18 (×2): 100 mg via ORAL
  Filled 2016-10-16 (×2): qty 1

## 2016-10-16 MED ORDER — CEFAZOLIN IN D5W 1 GM/50ML IV SOLN
1.0000 g | INTRAVENOUS | Status: AC
  Administered 2016-10-16: 1 g via INTRAVENOUS
  Filled 2016-10-16: qty 50

## 2016-10-16 MED ORDER — HEPARIN SODIUM (PORCINE) 1000 UNIT/ML IJ SOLN
INTRAMUSCULAR | Status: DC | PRN
  Administered 2016-10-16: 1000 [IU] via INTRAVENOUS
  Administered 2016-10-16: 10000 [IU] via INTRAVENOUS

## 2016-10-16 MED ORDER — CEFUROXIME SODIUM 1.5 G IJ SOLR
1.5000 g | Freq: Two times a day (BID) | INTRAMUSCULAR | Status: AC
  Administered 2016-10-16 – 2016-10-17 (×2): 1.5 g via INTRAVENOUS
  Filled 2016-10-16 (×2): qty 1.5

## 2016-10-16 MED ORDER — METOPROLOL TARTRATE 5 MG/5ML IV SOLN: 2.0000 mg | INTRAVENOUS | Status: DC | PRN

## 2016-10-16 MED ORDER — HEPARIN SODIUM (PORCINE) 1000 UNIT/ML IJ SOLN
INTRAMUSCULAR | Status: AC
  Filled 2016-10-16: qty 2

## 2016-10-16 MED ORDER — CEFAZOLIN SODIUM-DEXTROSE 2-4 GM/100ML-% IV SOLN
INTRAVENOUS | Status: AC
  Filled 2016-10-16: qty 100

## 2016-10-16 MED ORDER — ASPIRIN 325 MG PO TABS
325.0000 mg | ORAL_TABLET | Freq: Every day | ORAL | Status: DC
  Administered 2016-10-17 – 2016-10-18 (×2): 325 mg via ORAL
  Filled 2016-10-16 (×2): qty 1

## 2016-10-16 MED ORDER — ONDANSETRON HCL 4 MG/2ML IJ SOLN
INTRAMUSCULAR | Status: DC | PRN
  Administered 2016-10-16: 4 mg via INTRAVENOUS

## 2016-10-16 MED ORDER — HEMOSTATIC AGENTS (NO CHARGE) OPTIME
TOPICAL | Status: DC | PRN
Start: 2016-10-16 — End: 2016-10-16
  Administered 2016-10-16: 1 via TOPICAL

## 2016-10-16 MED ORDER — LACTATED RINGERS IV SOLN
INTRAVENOUS | Status: DC | PRN
  Administered 2016-10-16: 11:00:00 via INTRAVENOUS

## 2016-10-16 MED ORDER — FENTANYL CITRATE (PF) 100 MCG/2ML IJ SOLN
INTRAMUSCULAR | Status: DC | PRN
  Administered 2016-10-16 (×2): 50 ug via INTRAVENOUS

## 2016-10-16 MED ORDER — POLYETHYLENE GLYCOL 3350 17 G PO PACK: 17.0000 g | PACK | Freq: Every day | ORAL | Status: DC | PRN

## 2016-10-16 MED ORDER — PANTOPRAZOLE SODIUM 40 MG PO TBEC
40.0000 mg | DELAYED_RELEASE_TABLET | Freq: Every day | ORAL | Status: DC
  Administered 2016-10-17 – 2016-10-18 (×2): 40 mg via ORAL
  Filled 2016-10-16 (×2): qty 1

## 2016-10-16 MED ORDER — PHENOL 1.4 % MT LIQD: 1.0000 | OROMUCOSAL | Status: DC | PRN

## 2016-10-16 MED ORDER — LIDOCAINE HCL (PF) 1 % IJ SOLN
INTRAMUSCULAR | Status: AC
  Filled 2016-10-16: qty 30

## 2016-10-16 MED ORDER — PROPOFOL 10 MG/ML IV BOLUS
INTRAVENOUS | Status: AC
  Filled 2016-10-16: qty 20

## 2016-10-16 MED ORDER — 0.9 % SODIUM CHLORIDE (POUR BTL) OPTIME
TOPICAL | Status: DC | PRN
  Administered 2016-10-16: 3000 mL

## 2016-10-16 MED ORDER — DEXAMETHASONE SODIUM PHOSPHATE 10 MG/ML IJ SOLN
INTRAMUSCULAR | Status: AC
  Filled 2016-10-16: qty 1

## 2016-10-16 MED ORDER — OXYCODONE-ACETAMINOPHEN 5-325 MG PO TABS
1.0000 | ORAL_TABLET | ORAL | Status: DC | PRN
  Administered 2016-10-16: 2 via ORAL

## 2016-10-16 MED ORDER — PHENYLEPHRINE 40 MCG/ML (10ML) SYRINGE FOR IV PUSH (FOR BLOOD PRESSURE SUPPORT)
PREFILLED_SYRINGE | INTRAVENOUS | Status: AC
  Filled 2016-10-16: qty 10

## 2016-10-16 MED ORDER — SODIUM CHLORIDE 0.9 % IV SOLN
0.0125 ug/kg/min | Freq: Once | INTRAVENOUS | Status: AC
  Administered 2016-10-16: .2 ug/kg/min via INTRAVENOUS
  Filled 2016-10-16: qty 1000

## 2016-10-16 MED ORDER — LOSARTAN POTASSIUM 50 MG PO TABS
50.0000 mg | ORAL_TABLET | Freq: Every day | ORAL | Status: DC
  Administered 2016-10-17 – 2016-10-18 (×2): 50 mg via ORAL
  Filled 2016-10-16 (×2): qty 1

## 2016-10-16 MED ORDER — ROCURONIUM BROMIDE 100 MG/10ML IV SOLN
INTRAVENOUS | Status: DC | PRN
  Administered 2016-10-16: 50 mg via INTRAVENOUS

## 2016-10-16 MED ORDER — REMIFENTANIL HCL 1 MG IV SOLR
0.0125 ug/kg/min | INTRAVENOUS | Status: DC
  Filled 2016-10-16: qty 1000

## 2016-10-16 MED ORDER — NICARDIPINE HCL IN NACL 20-0.86 MG/200ML-% IV SOLN
3.0000 mg/h | INTRAVENOUS | Status: DC
Start: 2016-10-16 — End: 2016-10-16
  Filled 2016-10-16: qty 200

## 2016-10-16 MED ORDER — DEXAMETHASONE SODIUM PHOSPHATE 10 MG/ML IJ SOLN
INTRAMUSCULAR | Status: DC | PRN
  Administered 2016-10-16: 10 mg via INTRAVENOUS

## 2016-10-16 MED ORDER — HYDRALAZINE HCL 20 MG/ML IJ SOLN
INTRAMUSCULAR | Status: AC
  Filled 2016-10-16: qty 1

## 2016-10-16 MED ORDER — CEFAZOLIN SODIUM-DEXTROSE 2-3 GM-% IV SOLR
INTRAVENOUS | Status: DC | PRN
  Administered 2016-10-16: 2 g via INTRAVENOUS

## 2016-10-16 MED ORDER — ENOXAPARIN SODIUM 40 MG/0.4ML ~~LOC~~ SOLN
40.0000 mg | SUBCUTANEOUS | Status: DC
  Administered 2016-10-17: 40 mg via SUBCUTANEOUS
  Filled 2016-10-16: qty 0.4

## 2016-10-16 MED ORDER — PROMETHAZINE HCL 25 MG/ML IJ SOLN
6.2500 mg | INTRAMUSCULAR | Status: DC | PRN
  Administered 2016-10-16: 6.25 mg via INTRAVENOUS

## 2016-10-16 MED ORDER — LABETALOL HCL 5 MG/ML IV SOLN: 10.0000 mg | INTRAVENOUS | Status: DC | PRN

## 2016-10-16 MED ORDER — LIDOCAINE HCL (CARDIAC) 20 MG/ML IV SOLN
INTRAVENOUS | Status: DC | PRN
Start: 2016-10-16 — End: 2016-10-16
  Administered 2016-10-16: 80 mg via INTRAVENOUS

## 2016-10-16 MED ORDER — HYDRALAZINE HCL 20 MG/ML IJ SOLN: 5.0000 mg | INTRAMUSCULAR | Status: DC | PRN

## 2016-10-16 MED ORDER — GUAIFENESIN-DM 100-10 MG/5ML PO SYRP: 15.0000 mL | ORAL_SOLUTION | ORAL | Status: DC | PRN

## 2016-10-16 MED ORDER — HYDROMORPHONE HCL 1 MG/ML IJ SOLN
INTRAMUSCULAR | Status: AC
  Administered 2016-10-16: 0.5 mg via INTRAVENOUS
  Filled 2016-10-16: qty 1

## 2016-10-16 MED ORDER — HYDROMORPHONE HCL 1 MG/ML IJ SOLN
0.2500 mg | INTRAMUSCULAR | Status: DC | PRN
  Administered 2016-10-16 (×4): 0.5 mg via INTRAVENOUS

## 2016-10-16 MED ORDER — PROPOFOL 10 MG/ML IV BOLUS
INTRAVENOUS | Status: DC | PRN
  Administered 2016-10-16: 130 mg via INTRAVENOUS

## 2016-10-16 MED ORDER — HYDRALAZINE HCL 20 MG/ML IJ SOLN
INTRAMUSCULAR | Status: DC | PRN
  Administered 2016-10-16: 2.5 mg via INTRAVENOUS
  Administered 2016-10-16: 2 mg via INTRAVENOUS

## 2016-10-16 MED ORDER — EPHEDRINE SULFATE 50 MG/ML IJ SOLN
INTRAMUSCULAR | Status: DC | PRN
  Administered 2016-10-16 (×3): 10 mg via INTRAVENOUS

## 2016-10-16 MED ORDER — PROMETHAZINE HCL 25 MG/ML IJ SOLN
INTRAMUSCULAR | Status: AC
  Administered 2016-10-16: 6.25 mg via INTRAVENOUS
  Filled 2016-10-16: qty 1

## 2016-10-16 MED ORDER — FENTANYL CITRATE (PF) 250 MCG/5ML IJ SOLN
INTRAMUSCULAR | Status: AC
  Filled 2016-10-16: qty 5

## 2016-10-16 MED ORDER — SUCCINYLCHOLINE CHLORIDE 200 MG/10ML IV SOSY
PREFILLED_SYRINGE | INTRAVENOUS | Status: DC | PRN
  Administered 2016-10-16: 120 mg via INTRAVENOUS

## 2016-10-16 MED ORDER — SODIUM CHLORIDE 0.9 % IV SOLN: 500.0000 mL | Freq: Once | INTRAVENOUS | Status: DC | PRN

## 2016-10-16 MED ORDER — MAGNESIUM SULFATE 2 GM/50ML IV SOLN: 2.0000 g | Freq: Every day | INTRAVENOUS | Status: DC | PRN

## 2016-10-16 MED ORDER — GLYCOPYRROLATE 0.2 MG/ML IJ SOLN
INTRAMUSCULAR | Status: DC | PRN
  Administered 2016-10-16: 0.1 mg via INTRAVENOUS

## 2016-10-16 MED ORDER — LACTATED RINGERS IV SOLN
INTRAVENOUS | Status: DC | PRN
  Administered 2016-10-16 (×2): via INTRAVENOUS

## 2016-10-16 MED ORDER — SODIUM CHLORIDE 0.9 % IV SOLN
INTRAVENOUS | Status: DC | PRN
  Administered 2016-10-16: 500 mL

## 2016-10-16 MED ORDER — PHENYLEPHRINE 40 MCG/ML (10ML) SYRINGE FOR IV PUSH (FOR BLOOD PRESSURE SUPPORT)
PREFILLED_SYRINGE | INTRAVENOUS | Status: DC | PRN
  Administered 2016-10-16 (×2): 80 ug via INTRAVENOUS

## 2016-10-16 MED ORDER — POTASSIUM CHLORIDE CRYS ER 20 MEQ PO TBCR: 20.0000 meq | EXTENDED_RELEASE_TABLET | Freq: Every day | ORAL | Status: DC | PRN

## 2016-10-16 SURGICAL SUPPLY — 49 items
ADH SKN CLS APL DERMABOND .7 (GAUZE/BANDAGES/DRESSINGS) ×1
CANISTER SUCTION 2500CC (MISCELLANEOUS) ×2 IMPLANT
CATH ROBINSON RED A/P 18FR (CATHETERS) ×2 IMPLANT
CATH SUCT 10FR WHISTLE TIP (CATHETERS) ×2 IMPLANT
CLIP TI MEDIUM 6 (CLIP) ×2 IMPLANT
CLIP TI WIDE RED SMALL 6 (CLIP) ×3 IMPLANT
COVER PROBE W GEL 5X96 (DRAPES) ×1 IMPLANT
CRADLE DONUT ADULT HEAD (MISCELLANEOUS) ×2 IMPLANT
DERMABOND ADVANCED (GAUZE/BANDAGES/DRESSINGS) ×1
DERMABOND ADVANCED .7 DNX12 (GAUZE/BANDAGES/DRESSINGS) ×1 IMPLANT
DRAIN CHANNEL 15F RND FF W/TCR (WOUND CARE) IMPLANT
ELECT REM PT RETURN 9FT ADLT (ELECTROSURGICAL) ×2
ELECTRODE REM PT RTRN 9FT ADLT (ELECTROSURGICAL) ×1 IMPLANT
EVACUATOR SILICONE 100CC (DRAIN) IMPLANT
GLOVE BIO SURGEON STRL SZ7.5 (GLOVE) ×1 IMPLANT
GLOVE BIOGEL PI IND STRL 7.0 (GLOVE) IMPLANT
GLOVE BIOGEL PI IND STRL 7.5 (GLOVE) ×1 IMPLANT
GLOVE BIOGEL PI IND STRL 8 (GLOVE) IMPLANT
GLOVE BIOGEL PI INDICATOR 7.0 (GLOVE) ×1
GLOVE BIOGEL PI INDICATOR 7.5 (GLOVE) ×1
GLOVE BIOGEL PI INDICATOR 8 (GLOVE) ×1
GLOVE SURG SS PI 7.5 STRL IVOR (GLOVE) ×2 IMPLANT
GOWN STRL REUS W/ TWL LRG LVL3 (GOWN DISPOSABLE) ×2 IMPLANT
GOWN STRL REUS W/ TWL XL LVL3 (GOWN DISPOSABLE) ×1 IMPLANT
GOWN STRL REUS W/TWL LRG LVL3 (GOWN DISPOSABLE) ×6
GOWN STRL REUS W/TWL XL LVL3 (GOWN DISPOSABLE) ×2
HEMOSTAT SNOW SURGICEL 2X4 (HEMOSTASIS) IMPLANT
INSERT FOGARTY SM (MISCELLANEOUS) IMPLANT
KIT BASIN OR (CUSTOM PROCEDURE TRAY) ×2 IMPLANT
KIT ROOM TURNOVER OR (KITS) ×2 IMPLANT
NDL HYPO 25GX1X1/2 BEV (NEEDLE) IMPLANT
NEEDLE HYPO 25GX1X1/2 BEV (NEEDLE) IMPLANT
NS IRRIG 1000ML POUR BTL (IV SOLUTION) ×6 IMPLANT
PACK CAROTID (CUSTOM PROCEDURE TRAY) ×2 IMPLANT
PAD ARMBOARD 7.5X6 YLW CONV (MISCELLANEOUS) ×4 IMPLANT
PATCH VASC XENOSURE 1CMX6CM (Vascular Products) ×2 IMPLANT
PATCH VASC XENOSURE 1X6 (Vascular Products) IMPLANT
SHUNT CAROTID BYPASS 10 (VASCULAR PRODUCTS) IMPLANT
SHUNT CAROTID BYPASS 12FRX15.5 (VASCULAR PRODUCTS) IMPLANT
SUT ETHILON 3 0 PS 1 (SUTURE) IMPLANT
SUT PROLENE 6 0 BV (SUTURE) ×4 IMPLANT
SUT PROLENE 7 0 BV 1 (SUTURE) IMPLANT
SUT SILK 3 0 (SUTURE)
SUT SILK 3-0 18XBRD TIE 12 (SUTURE) IMPLANT
SUT VIC AB 3-0 SH 27 (SUTURE) ×4
SUT VIC AB 3-0 SH 27X BRD (SUTURE) ×2 IMPLANT
SUT VICRYL 4-0 PS2 18IN ABS (SUTURE) ×2 IMPLANT
SYR CONTROL 10ML LL (SYRINGE) IMPLANT
WATER STERILE IRR 1000ML POUR (IV SOLUTION) ×2 IMPLANT

## 2016-10-16 NOTE — Transfer of Care (Signed)
Immediate Anesthesia Transfer of Care Note  Patient: Jason Melendez  Procedure(s) Performed: Procedure(s): ENDARTERECTOMY CAROTID (Left) PATCH ANGIOPLASTY USING XENOSURE BIOLOGIC PATCH (Left)  Patient Location: PACU  Anesthesia Type:General  Level of Consciousness: awake and alert   Airway & Oxygen Therapy: Patient Spontanous Breathing and Patient connected to face mask oxygen  Post-op Assessment: Report given to RN, Post -op Vital signs reviewed and stable, Patient moving all extremities, Patient moving all extremities X 4 and Patient able to stick tongue midline  Post vital signs: Reviewed and stable  Last Vitals:  Vitals:   10/16/16 0521 10/16/16 1036  BP: (!) 153/75 (!) 218/118  Pulse: 76 83  Resp: 20   Temp: 36.8 C     Last Pain:  Vitals:   10/16/16 0521  TempSrc: Oral  PainSc:          Complications: No apparent anesthesia complications

## 2016-10-16 NOTE — Progress Notes (Signed)
Progress Note  Patient Name: Jason Melendez Date of Encounter: 10/16/2016  Primary Cardiologist: Mendel RyderH. Mickeal Daws  Subjective   No complaints over night. To have carotid endarterectomy performed today by Dr. Myra GianottiBrabham. Discussed the implications of the myocardial perfusion study with patient and family. The study is intermediate risk because of decreased LV function. No evidence of ischemia was noted. This places him at a slightly higher risk of developing volume overload/CHF if aggressive fluid administration occurs.  Inpatient Medications    Scheduled Meds: . aspirin  325 mg Oral Daily  . atorvastatin  40 mg Oral q1800  . carvedilol  3.125 mg Oral BID WC  .  ceFAZolin (ANCEF) IV  1 g Intravenous To SSTC  . enoxaparin (LOVENOX) injection  40 mg Subcutaneous Q24H  . losartan  25 mg Oral Daily   Continuous Infusions: . sodium chloride 20 mL/hr at 10/12/16 2111  . sodium chloride 250 mL (10/16/16 0811)   PRN Meds: acetaminophen **OR** acetaminophen (TYLENOL) oral liquid 160 mg/5 mL **OR** acetaminophen, senna-docusate   Vital Signs    Vitals:   10/15/16 1653 10/15/16 2136 10/16/16 0103 10/16/16 0521  BP: (!) 164/79 (!) 145/69 (!) 149/72 (!) 153/75  Pulse: 86 85 88 76  Resp: 20 18 20 20   Temp: 98.3 F (36.8 C) 98.7 F (37.1 C) 98 F (36.7 C) 98.2 F (36.8 C)  TempSrc: Oral Oral Oral Oral  SpO2: 96% 96% 98% 97%  Weight:      Height:       No intake or output data in the 24 hours ending 10/16/16 0849 Filed Weights   10/12/16 1937 10/13/16 0236  Weight: 265 lb (120.2 kg) 266 lb (120.7 kg)    Telemetry    Not on telemetry - Personally Reviewed  ECG    No new tracing - Personally Reviewed  Physical Exam  Obese. Seen in nuclear lab. No distress. BP elevated. GEN: No acute distress.   Neck: No JVD Cardiac: RRR, no murmurs, rubs, or gallops.  Respiratory: Clear to auscultation bilaterally. GI: Soft, nontender, non-distended  MS: No edema; No deformity. Neuro:   Nonfocal  Psych: Normal affect   Labs    Chemistry Recent Labs Lab 10/12/16 2322  10/14/16 0553 10/15/16 0250 10/16/16 0304  NA  --   < > 139 139 140  K  --   < > 4.0 3.7 4.0  CL  --   < > 105 105 106  CO2  --   < > 22 27 26   GLUCOSE  --   < > 137* 142* 133*  BUN  --   < > 9 11 13   CREATININE  --   < > 1.02 1.06 1.09  CALCIUM  --   < > 9.0 8.9 9.1  PROT 6.0*  --   --   --   --   ALBUMIN 3.5  --   --   --   --   AST 34  --   --   --   --   ALT 18  --   --   --   --   ALKPHOS 34*  --   --   --   --   BILITOT 0.9  --   --   --   --   GFRNONAA  --   < > >60 >60 >60  GFRAA  --   < > >60 >60 >60  ANIONGAP  --   < > 12 7 8   < > =  values in this interval not displayed.   Hematology  Recent Labs Lab 10/14/16 0553 10/15/16 0250 10/16/16 0304  WBC 7.6 6.3 6.6  RBC 4.78 4.37 4.48  HGB 14.4 13.0 13.4  HCT 44.3 40.2 40.9  MCV 92.7 92.0 91.3  MCH 30.1 29.7 29.9  MCHC 32.5 32.3 32.8  RDW 13.4 13.1 13.1  PLT 140* 132* 130*    Cardiac EnzymesNo results for input(s): TROPONINI in the last 168 hours.   Recent Labs Lab 10/12/16 2044  TROPIPOC 0.02     BNP  Recent Labs Lab 10/12/16 1949  BNP 27.0     DDimer   Recent Labs Lab 10/12/16 2058  DDIMER 0.89*     Radiology    Nm Myocar Multi W/spect W/wall Motion / Ef  Result Date: 10/15/2016 CLINICAL DATA:  Left hemispheric cerebral infarction and left carotid stenosis. Cardiac clearance required prior to possible left carotid endarterectomy. Evidence of systolic LV dysfunction by echocardiography with reduced ejection fraction. Hypertension and hyperlipidemia. EXAM: MYOCARDIAL IMAGING WITH SPECT (REST AND PHARMACOLOGIC-STRESS) GATED LEFT VENTRICULAR WALL MOTION STUDY LEFT VENTRICULAR EJECTION FRACTION TECHNIQUE: Standard myocardial SPECT imaging was performed after resting intravenous injection of 10 mCi Tc-77m tetrofosmin. Subsequently, intravenous infusion of Lexiscan was performed under the supervision of the  Cardiology staff. At peak effect of the drug, 30 mCi Tc-47m tetrofosmin was injected intravenously and standard myocardial SPECT imaging was performed. Quantitative gated imaging was also performed to evaluate left ventricular wall motion, and estimate left ventricular ejection fraction. COMPARISON:  None. FINDINGS: Perfusion: There is mild apical attenuation present on both stress and rest imaging. No evidence of inducible myocardial ischemia. Wall Motion: The left ventricle demonstrates global hypokinesis and mild cavity dilatation. Left Ventricular Ejection Fraction: 34 % End diastolic volume 170 ml End systolic volume 113 ml IMPRESSION: 1. Mild attenuation of the left ventricular apex without evidence of inducible myocardial ischemia. 2. Global left ventricular hypokinesis with LV cavity dilatation. 3. Left ventricular ejection fraction 34% 4. Non invasive risk stratification*: Intermediate *2012 Appropriate Use Criteria for Coronary Revascularization Focused Update: J Am Coll Cardiol. 2012;59(9):857-881. http://[email or web address] Electronically Signed   By: Irish Lack M.D.   On: 10/15/2016 15:01    Cardiac Studies   Echocardiogram 10/13/16: Study Conclusions  - Left ventricle: The cavity size was mildly dilated. There was   mild focal basal hypertrophy of the septum. Systolic function was   mildly to moderately reduced. The estimated ejection fraction was   in the range of 40% to 45%. Diffuse hypokinesis. There was an   increased relative contribution of atrial contraction to   ventricular filling. Doppler parameters are consistent with   abnormal left ventricular relaxation (grade 1 diastolic   dysfunction). - Aortic valve: Trileaflet; mildly thickened, mildly calcified   leaflets. - Left atrium: The atrium was mildly dilated. - Pulmonary arteries: Systolic pressure could not be accurately   estimated.  Patient Profile     61 y.o. 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 Severe hypertension never previously treated.No evidence of ischemia on myocardial perfusion imaging  Assessment & Plan    1. Chronic HFrEF, 40-45%. Etiology suspected to be untreated hypertension. No evidence of ischemia on nuclear scintigraphy. He is cleared for left carotid surgery and is moderate risk for cardiovascular complications. The major concern would be volume overload.   2. Acute left brain CVA, ? Related to left common carotic ulcerated  plaque vs other source. Unclear etiology. Will discuss with colleagues. If not carotid related, needs to be on telemetry, will need 30 day monitor or Loop, and possibly a TEE.  3. Severe BP elevation, ARB started today. Already on low dose carvedilol. Both have room for optimization when given approval by neurology. Beginning in a.m., we will increase losartan to 50 mg daily. Needs to have a metabolic panel prior to discharge.  Overall, doing well from the cardiac standpoint. He needs to have cardiology follow-up in 2-4 weeks post discharge to allow optimal titration of guidelines mandated heart failure therapy.  Signed, Lesleigh Noe, MD  10/16/2016, 8:49 AM

## 2016-10-16 NOTE — Anesthesia Procedure Notes (Signed)
Procedure Name: Intubation Date/Time: 10/16/2016 11:39 AM Performed by: Rise PatienceBELL, Lazaro Isenhower T Pre-anesthesia Checklist: Patient identified, Emergency Drugs available, Suction available and Patient being monitored Patient Re-evaluated:Patient Re-evaluated prior to inductionOxygen Delivery Method: Circle System Utilized Preoxygenation: Pre-oxygenation with 100% oxygen Intubation Type: IV induction Ventilation: Mask ventilation without difficulty and Oral airway inserted - appropriate to patient size Laryngoscope Size: Hyacinth MeekerMiller and 2 Grade View: Grade I Tube type: Oral Tube size: 7.5 mm Number of attempts: 1 Airway Equipment and Method: Stylet and Oral airway Placement Confirmation: ETT inserted through vocal cords under direct vision,  positive ETCO2 and breath sounds checked- equal and bilateral Secured at: 23 cm Tube secured with: Tape Dental Injury: Teeth and Oropharynx as per pre-operative assessment

## 2016-10-16 NOTE — Anesthesia Preprocedure Evaluation (Addendum)
Anesthesia Evaluation  Patient identified by MRN, date of birth, ID band Patient awake    Reviewed: Allergy & Precautions, NPO status , Patient's Chart, lab work & pertinent test results  Airway Mallampati: II  TM Distance: >3 FB Neck ROM: Full    Dental  (+) Teeth Intact, Chipped, Dental Advisory Given   Pulmonary    breath sounds clear to auscultation       Cardiovascular hypertension, + Peripheral Vascular Disease and +CHF  (-) CABG  Rhythm:Regular Rate:Normal  Decreased systolic function on echo, poorly controlled HTN until this admission   Neuro/Psych Recent acute cva CVA    GI/Hepatic   Endo/Other  Morbid obesity  Renal/GU      Musculoskeletal   Abdominal (+) + obese,   Peds  Hematology   Anesthesia Other Findings   Reproductive/Obstetrics                            Anesthesia Physical Anesthesia Plan  ASA: IV  Anesthesia Plan: General   Post-op Pain Management:    Induction: Intravenous  Airway Management Planned: Oral ETT  Additional Equipment: Arterial line  Intra-op Plan:   Post-operative Plan: Extubation in OR and Possible Post-op intubation/ventilation  Informed Consent: I have reviewed the patients History and Physical, chart, labs and discussed the procedure including the risks, benefits and alternatives for the proposed anesthesia with the patient or authorized representative who has indicated his/her understanding and acceptance.   Dental advisory given  Plan Discussed with:   Anesthesia Plan Comments:         Anesthesia Quick Evaluation

## 2016-10-16 NOTE — Anesthesia Postprocedure Evaluation (Addendum)
Anesthesia Post Note  Patient: Clayborne DanaJr Jasai Hohler  Procedure(s) Performed: Procedure(s) (LRB): ENDARTERECTOMY CAROTID (Left) PATCH ANGIOPLASTY USING XENOSURE BIOLOGIC PATCH (Left)  Patient location during evaluation: PACU Anesthesia Type: General Level of consciousness: awake and alert and oriented Pain management: pain level controlled Vital Signs Assessment: post-procedure vital signs reviewed and stable Respiratory status: spontaneous breathing, nonlabored ventilation, respiratory function stable and patient connected to nasal cannula oxygen Cardiovascular status: blood pressure returned to baseline and stable Postop Assessment: no signs of nausea or vomiting Anesthetic complications: no       Last Vitals:  Vitals:   10/16/16 1445 10/16/16 1500  BP:    Pulse: 95 91  Resp: 14 12  Temp:      Last Pain:  Vitals:   10/16/16 0521  TempSrc: Oral                 Gregary Blackard,JAMES TERRILL

## 2016-10-16 NOTE — Progress Notes (Signed)
  Day of Surgery Note    Subjective:  No complaints-says he is better than he was feeling in the ER when he was down there.   Vitals:   10/16/16 1036 10/16/16 1400  BP: (!) 218/118 (!) 150/99  Pulse: 83   Resp:  15  Temp:  98 F (36.7 C)    Incisions:   Some fullness in the proximal incision, but soft  Extremities:  Moving all extremities equally Cardiac:  regular Lungs:  Non labored Neuro:  In tact; tongue is midline   Assessment/Plan:  This is a 61 y.o. male who is s/p left carotid endarterectomy  -pt doing well and neurologically intact. -somewhat hypertensive-improved with hydralazine.  He did not get his Losartan this am-may benefit from dose today-will see how his BP does.  -transfer to [address]    Jason Melendez, [place]PA-C 10/16/2016 2:31 PM [phone]

## 2016-10-16 NOTE — Progress Notes (Signed)
PROGRESS NOTE                                                                                                                                                                                                             Patient Demographics:    Jason Melendez, is a 61 y.o. male, DOB - 1956/06/16, WJX:[id number]  Admit date - 10/12/2016   Admitting Physician Clydie Braun, MD  Outpatient Primary MD for the patient is No PCP Per Patient  LOS - 3   No chief complaint on file.      Brief Narrative  61 year old male with history of hypertension, presents with right-sided weakness, workup significant for acute left frontoparietal CVA.Felt secondary to ICA stenosis, planned for left ICA today after patient cleared by cardiology.   Subjective:    Jason Melendez today has, No headache, No chest pain, No abdominal pain - Denies any further deficits or weeknesss..  Assessment  & Plan :    Principal Problem:   CVA (cerebral vascular accident) (HCC) Active Problems:   HTN (hypertension)   HLD (hyperlipidemia)   Obesity (BMI 30.0-34.9)   Stenosis of left carotid artery   Cardiomyopathy (HCC)  Acute CVA  - MRI brain significant for left frontoparietal acute CVA . - MRA head with Occluded mid to distal callosomarginal branch of th eanterior cerebral artery. Predominately azygos ACA. - MRA NECK: 50% stenosis bilateral proximal internal carotid artery - LDL is 109, started on pravastatin - CTA of neck with 50% left ICA/CCA stenosis with calcification and soft plaques, vascular surgery consulted per neuro recommendation, Plan for left CEA as  left carotid  Stenosis felt to be the most likely cause to his CVA -2-D echo with EF 40-45% with diffuse hypokinesis, intermediate risk stress test -Doppler with no evidence of DVT - Continue with full dose aspirin  Hypertension - Usually permissive hypertension, currently better controlled on Coreg and  losartan.  Chronic systolic CHF - EF at 40-45% and diffuse hypokinesis,Myoview perfusion stress test is intermediate risk  Hyperlipidemia - Patient reports he stopped taking statin at home, resumed on atorvastatin, LDL of 109  Code Status : Full   Family Communication  : Wife and daughter at bedside   Disposition Plan  : Pending PT evaluation   Consults  :  neurology  Procedures  : none  DVT  Prophylaxis  :  Lovenox   Lab Results  Component Value Date   PLT 130 (L) 10/16/2016    Antibiotics  :    Anti-infectives    Start     Dose/Rate Route Frequency Ordered Stop   10/16/16 1031  ceFAZolin (ANCEF) 2-4 GM/100ML-% IVPB    Comments:  Marrianne Mood   : cabinet override      10/16/16 1031 10/16/16 2244   10/16/16 0745  ceFAZolin (ANCEF) IVPB 1 g/50 mL premix    Comments:  Send with pt to OR   1 g 100 mL/hr over 30 Minutes Intravenous To Short Stay 10/16/16 0744 10/16/16 0922        Objective:   Vitals:   10/15/16 2136 10/16/16 0103 10/16/16 0521 10/16/16 1036  BP: (!) 145/69 (!) 149/72 (!) 153/75 (!) 218/118  Pulse: 85 88 76 83  Resp: 18 20 20    Temp: 98.7 F (37.1 C) 98 F (36.7 C) 98.2 F (36.8 C)   TempSrc: Oral Oral Oral   SpO2: 96% 98% 97%   Weight:      Height:        Wt Readings from Last 3 Encounters:  10/13/16 120.7 kg (266 lb)  10/12/16 122.5 kg (270 lb)  10/29/15 109.8 kg (242 lb)    No intake or output data in the 24 hours ending 10/16/16 1307   Physical Exam  Awake Alert, Oriented X 3, No new F.N deficits, Normal affect [place].AT,PERRAL Supple Neck,No JVD, Symmetrical Chest wall movement, Good air movement bilaterally, CTAB RRR,No Gallops,Rubs or new Murmurs, No Parasternal Heave +ve B.Sounds, Abd Soft, No tenderness,No rebound - guarding or rigidity. No Cyanosis, Clubbing or edema,No deficits    Data Review:    CBC  Recent Labs Lab 10/12/16 1949 10/13/16 0217 10/14/16 0553 10/15/16 0250 10/16/16 0304  WBC 6.4 8.0 7.6 6.3  6.6  HGB 14.8 14.1 14.4 13.0 13.4  HCT 44.5 42.4 44.3 40.2 40.9  PLT 145* 137* 140* 132* 130*  MCV 91.2 91.8 92.7 92.0 91.3  MCH 30.3 30.5 30.1 29.7 29.9  MCHC 33.3 33.3 32.5 32.3 32.8  RDW 13.1 13.1 13.4 13.1 13.1  LYMPHSABS 2.4 2.1  --   --   --   MONOABS 0.4 0.4  --   --   --   EOSABS 0.1 0.1  --   --   --   BASOSABS 0.0 0.0  --   --   --     Chemistries   Recent Labs Lab 10/12/16 1949 10/12/16 2322 10/13/16 0217 10/14/16 0553 10/15/16 0250 10/16/16 0304  NA 141  --  140 139 139 140  K 3.9  --  3.8 4.0 3.7 4.0  CL 106  --  105 105 105 106  CO2 25  --  24 22 27 26   GLUCOSE 117*  --  153* 137* 142* 133*  BUN 12  --  10 9 11 13   CREATININE 1.01  --  1.05 1.02 1.06 1.09  CALCIUM 9.5  --  9.1 9.0 8.9 9.1  AST  --  34  --   --   --   --   ALT  --  18  --   --   --   --   ALKPHOS  --  34*  --   --   --   --   BILITOT  --  0.9  --   --   --   --    ------------------------------------------------------------------------------------------------------------------ No results  for input(s): CHOL, HDL, LDLCALC, TRIG, CHOLHDL, LDLDIRECT in the last 72 hours.  Lab Results  Component Value Date   HGBA1C 6.4 (H) 10/13/2016   ------------------------------------------------------------------------------------------------------------------ No results for input(s): TSH, T4TOTAL, T3FREE, THYROIDAB in the last 72 hours.  Invalid input(s): FREET3 ------------------------------------------------------------------------------------------------------------------ No results for input(s): VITAMINB12, FOLATE, FERRITIN, TIBC, IRON, RETICCTPCT in the last 72 hours.  Coagulation profile  Recent Labs Lab 10/12/16 2058  INR 1.05    No results for input(s): DDIMER in the last 72 hours.  Cardiac Enzymes No results for input(s): CKMB, TROPONINI, MYOGLOBIN in the last 168 hours.  Invalid input(s):  CK ------------------------------------------------------------------------------------------------------------------    Component Value Date/Time   BNP 27.0 10/12/2016 1949    Inpatient Medications  Scheduled Meds: . [MAR Hold] aspirin  325 mg Oral Daily  . [MAR Hold] atorvastatin  40 mg Oral q1800  . [MAR Hold] carvedilol  3.125 mg Oral BID WC  . ceFAZolin      . [MAR Hold] enoxaparin (LOVENOX) injection  40 mg Subcutaneous Q24H  . [MAR Hold] losartan  50 mg Oral Daily   Continuous Infusions: . sodium chloride 20 mL/hr at 10/12/16 2111  . sodium chloride 250 mL (10/16/16 0811)   PRN Meds:.0.9 % irrigation (POUR BTL), [MAR Hold] acetaminophen **OR** [MAR Hold] acetaminophen (TYLENOL) oral liquid 160 mg/5 mL **OR** [MAR Hold] acetaminophen, heparin 6000 unit irrigation, [MAR Hold] senna-docusate  Micro Results Recent Results (from the past 240 hour(s))  Surgical pcr screen     Status: None   Collection Time: 10/16/16  5:45 AM  Result Value Ref Range Status   [name], PCR NEGATIVE NEGATIVE Final   Staphylococcus aureus NEGATIVE NEGATIVE Final    Comment:        The Xpert SA Assay (FDA approved for NASAL specimens in patients over 30 years of age), is one component of a comprehensive surveillance program.  Test performance has been validated by [facility] for patients greater than or equal to 13 year old. It is not intended to diagnose infection nor to guide or monitor treatment.     Radiology Reports Dg Chest 2 View  Result Date: 10/12/2016 CLINICAL DATA:  Initial evaluation for acute right-sided chest pain. EXAM: CHEST  2 VIEW COMPARISON:  None. FINDINGS: The cardiac and mediastinal silhouettes are within normal limits. The lungs are normally inflated. No airspace consolidation, pleural effusion, or pulmonary edema is identified. There is no pneumothorax. No acute osseous abnormality identified. IMPRESSION: No active cardiopulmonary disease. Electronically Signed   By:  Rise Mu M.D.   On: 10/12/2016 21:33   Ct Head Wo Contrast  Result Date: 10/12/2016 CLINICAL DATA:  Right leg and arm weakness.  Hypertension. EXAM: CT HEAD WITHOUT CONTRAST TECHNIQUE: Contiguous axial images were obtained from the base of the skull through the vertex without intravenous contrast. COMPARISON:  None. FINDINGS: Brain: No mass lesion, intraparenchymal hemorrhage or extra-axial collection. No evidence of acute cortical infarct. Brain parenchyma and CSF-containing spaces are normal for age. Left parafalcine arachnoid cyst. Vascular: No hyperdense vessel or unexpected calcification. Skull: Normal visualized skull base, calvarium and extracranial soft tissues. Sinuses/Orbits: No sinus fluid levels or advanced mucosal thickening. No mastoid effusion. Normal orbits. IMPRESSION: No acute intracranial abnormality. Electronically Signed   By: Deatra Robinson M.D.   On: 10/12/2016 21:12   Ct Angio Neck W Or Wo Contrast  Result Date: 10/13/2016 CLINICAL DATA:  61 year old male with left ACA infarct discovered during evaluation of confusion, right side weakness for 2 days. Initial encounter.  EXAM: CT ANGIOGRAPHY NECK TECHNIQUE: Multidetector CT imaging of the neck was performed using the standard protocol during bolus administration of intravenous contrast. Multiplanar CT image reconstructions and MIPs were obtained to evaluate the vascular anatomy. Carotid stenosis measurements (when applicable) are obtained utilizing NASCET criteria, using the distal internal carotid diameter as the denominator. CONTRAST:  50 mL Isovue 370 COMPARISON:  Brain MRI and MRA head and neck 0050 hours today. FINDINGS: Skeleton: Carious posterior dentition. No acute osseous abnormality identified. Visualized paranasal sinuses and mastoids are stable and well pneumatized. Upper chest: Superior mediastinal lipomatosis. No superior mediastinal lymphadenopathy. Negative visualized lung parenchyma. Other neck: Negative  thyroid, larynx, pharynx, parapharyngeal spaces, retropharyngeal space, sublingual space, submandibular glands and parotid glands. No cervical lymphadenopathy. Aortic arch: 3 vessel arch configuration. Mild distal arch calcified atherosclerosis. Right carotid system: No brachiocephalic artery or right CCA origin stenosis. Mildly tortuous proximal right CCA. Soft plaque along the anterior right CCA without stenosis (series 401, image 74). Soft and calcified plaque at the right carotid bifurcation with no proximal right ICA stenosis (series 404, image 93). Severely tortuous mid cervical right ICA. Otherwise negative right ICA to the level of the distal petrous segment. Left carotid system: No left CCA origin stenosis. Tortuous proximal left CCA. Minimal soft plaque proximal to the left carotid bifurcation. At the left carotid bifurcation there is low-density soft and calcified plaque affecting the left ICA origin and bulb. Subsequent stenosis is up to 50 % with respect to the distal vessel in the proximal left ICA. Mildly tortuous more distal cervical left ICA. Visible left siphon is patent with mild calcified plaque. Vertebral arteries:No proximal right subclavian artery stenosis. Right vertebral artery origin appears normal. There is a late entry of the right vertebral artery into the cervical transverse foramen. The right vertebral artery is mildly dominant. No right vertebral artery stenosis to the vertebrobasilar junction. Visible basilar artery is unremarkable. No proximal left subclavian artery stenosis despite mild plaque. The left vertebral artery origin is within normal limits. Tortuous left V1 segment. The left vertebral artery is mildly non dominant and patent to the vertebrobasilar junction without stenosis. Normal left PICA origin. IMPRESSION: 1. Soft and calcified bilateral carotid atherosclerosis in the neck with no hemodynamically significant stenosis; 50% proximal left ICA stenosis. 2. Dominant right  vertebral artery with no definite vertebral artery stenosis. 3. Carious posterior dentition. Electronically Signed   By: Odessa Fleming M.D.   On: 10/13/2016 13:01   Mr Maxine Glenn Head Wo Contrast  Result Date: 10/13/2016 CLINICAL DATA:  RIGHT-sided weakness for 2 days. Hypertensive, RIGHT-sided chest pain and elevated D-dimer. Confusion. Assess stroke. EXAM: MRI HEAD WITHOUT CONTRAST MRA HEAD WITHOUT CONTRAST MRA NECK WITHOUT AND WITH CONTRAST TECHNIQUE: Multiplanar, multiecho pulse sequences of the brain and surrounding structures were obtained without intravenous contrast. Angiographic images of the Circle of Willis were obtained using MRA technique without intravenous contrast. Angiographic images of the neck were obtained using MRA technique without and with intravenous contrast. Carotid stenosis measurements (when applicable) are obtained utilizing NASCET criteria, using the distal internal carotid diameter as the denominator. CONTRAST:  20mL MULTIHANCE GADOBENATE DIMEGLUMINE 529 MG/ML IV SOLN COMPARISON:  CT HEAD October 12, 2016 FINDINGS: MRI HEAD FINDINGS BRAIN: 2 x 3 cm area reduced diffusion LEFT mesial posterior frontal lobe. subcentimeter foci of reduced diffusion posterior to the dominant area, within LEFT mesial frontal and parietal lobes, with corresponding low ADC values and slight bright FLAIR T2 signal. No susceptibility artifact to suggest hemorrhage. A few scattered  subcentimeter supratentorial white matter FLAIR T2 hyperintensities. The ventricles and sulci are normal for patient's age. No suspicious parenchymal signal, masses or mass effect. No abnormal extra-axial fluid collections. VASCULAR: Normal major intracranial vascular flow voids present at skull base. SKULL AND UPPER CERVICAL SPINE: No abnormal sellar expansion. No suspicious calvarial bone marrow signal. Craniocervical junction maintained. SINUSES/ORBITS: The mastoid air-cells and included paranasal sinuses are well-aerated. The included  ocular globes and orbital contents are non-suspicious. OTHER: Subcentimeter lymph nodes bilateral parotid glands. MRA HEAD FINDINGS ANTERIOR CIRCULATION: Normal flow related enhancement of the included cervical, petrous, cavernous and supraclinoid internal carotid arteries. Patent anterior communicating artery. Predominately azygos ACA, occluded branch of the callosum marginal mid to distal segment. Normal flow related enhancement of the middle cerebral arteries, including distal segments. No large vessel occlusion, high-grade stenosis, abnormal luminal irregularity, aneurysm. POSTERIOR CIRCULATION: Codominant vertebral artery's. Basilar artery is patent, with normal flow related enhancement of the main branch vessels. Normal flow related enhancement of the posterior cerebral arteries. No large vessel occlusion, high-grade stenosis, abnormal luminal irregularity, aneurysm. ANATOMIC VARIANTS: None. MRA NECK FINDINGS ANTERIOR CIRCULATION: The common carotid arteries are widely patent bilaterally. The carotid bifurcation are patent bilaterally. Within 5 mm of the bilateral internal carotid artery origins are 2 mm segments of 50% stenosis by NASCET criteria. No flow limiting stenosis of the cervical internal carotid arteries. POSTERIOR CIRCULATION: Bilateral vertebral arteries are patent to the vertebrobasilar junction. No evidence for atherosclerosis or flow limiting stenosis. Source images and MIP image were reviewed. IMPRESSION: MRI HEAD: Acute nonhemorrhagic LEFT frontoparietal/ACA territory infarcts. Mild chronic small vessel ischemic disease. MRA HEAD: Occluded mid to distal callosomarginal branch of the anterior cerebral artery. Predominately azygos ACA. MRA NECK: 50% stenosis bilateral proximal internal carotid artery origins. Electronically Signed   By: Awilda Metro M.D.   On: 10/13/2016 02:35   Mr Angiogram Neck W Or Wo Contrast  Result Date: 10/13/2016 CLINICAL DATA:  RIGHT-sided weakness for 2 days.  Hypertensive, RIGHT-sided chest pain and elevated D-dimer. Confusion. Assess stroke. EXAM: MRI HEAD WITHOUT CONTRAST MRA HEAD WITHOUT CONTRAST MRA NECK WITHOUT AND WITH CONTRAST TECHNIQUE: Multiplanar, multiecho pulse sequences of the brain and surrounding structures were obtained without intravenous contrast. Angiographic images of the Circle of Willis were obtained using MRA technique without intravenous contrast. Angiographic images of the neck were obtained using MRA technique without and with intravenous contrast. Carotid stenosis measurements (when applicable) are obtained utilizing NASCET criteria, using the distal internal carotid diameter as the denominator. CONTRAST:  20mL MULTIHANCE GADOBENATE DIMEGLUMINE 529 MG/ML IV SOLN COMPARISON:  CT HEAD October 12, 2016 FINDINGS: MRI HEAD FINDINGS BRAIN: 2 x 3 cm area reduced diffusion LEFT mesial posterior frontal lobe. subcentimeter foci of reduced diffusion posterior to the dominant area, within LEFT mesial frontal and parietal lobes, with corresponding low ADC values and slight bright FLAIR T2 signal. No susceptibility artifact to suggest hemorrhage. A few scattered subcentimeter supratentorial white matter FLAIR T2 hyperintensities. The ventricles and sulci are normal for patient's age. No suspicious parenchymal signal, masses or mass effect. No abnormal extra-axial fluid collections. VASCULAR: Normal major intracranial vascular flow voids present at skull base. SKULL AND UPPER CERVICAL SPINE: No abnormal sellar expansion. No suspicious calvarial bone marrow signal. Craniocervical junction maintained. SINUSES/ORBITS: The mastoid air-cells and included paranasal sinuses are well-aerated. The included ocular globes and orbital contents are non-suspicious. OTHER: Subcentimeter lymph nodes bilateral parotid glands. MRA HEAD FINDINGS ANTERIOR CIRCULATION: Normal flow related enhancement of the included cervical, petrous, cavernous and supraclinoid internal  carotid  arteries. Patent anterior communicating artery. Predominately azygos ACA, occluded branch of the callosum marginal mid to distal segment. Normal flow related enhancement of the middle cerebral arteries, including distal segments. No large vessel occlusion, high-grade stenosis, abnormal luminal irregularity, aneurysm. POSTERIOR CIRCULATION: Codominant vertebral artery's. Basilar artery is patent, with normal flow related enhancement of the main branch vessels. Normal flow related enhancement of the posterior cerebral arteries. No large vessel occlusion, high-grade stenosis, abnormal luminal irregularity, aneurysm. ANATOMIC VARIANTS: None. MRA NECK FINDINGS ANTERIOR CIRCULATION: The common carotid arteries are widely patent bilaterally. The carotid bifurcation are patent bilaterally. Within 5 mm of the bilateral internal carotid artery origins are 2 mm segments of 50% stenosis by NASCET criteria. No flow limiting stenosis of the cervical internal carotid arteries. POSTERIOR CIRCULATION: Bilateral vertebral arteries are patent to the vertebrobasilar junction. No evidence for atherosclerosis or flow limiting stenosis. Source images and MIP image were reviewed. IMPRESSION: MRI HEAD: Acute nonhemorrhagic LEFT frontoparietal/ACA territory infarcts. Mild chronic small vessel ischemic disease. MRA HEAD: Occluded mid to distal callosomarginal branch of the anterior cerebral artery. Predominately azygos ACA. MRA NECK: 50% stenosis bilateral proximal internal carotid artery origins. Electronically Signed   By: Awilda Metroourtnay  Bloomer M.D.   On: 10/13/2016 02:35   Mr Brain Wo Contrast  Result Date: 10/13/2016 CLINICAL DATA:  RIGHT-sided weakness for 2 days. Hypertensive, RIGHT-sided chest pain and elevated D-dimer. Confusion. Assess stroke. EXAM: MRI HEAD WITHOUT CONTRAST MRA HEAD WITHOUT CONTRAST MRA NECK WITHOUT AND WITH CONTRAST TECHNIQUE: Multiplanar, multiecho pulse sequences of the brain and surrounding structures were  obtained without intravenous contrast. Angiographic images of the Circle of Willis were obtained using MRA technique without intravenous contrast. Angiographic images of the neck were obtained using MRA technique without and with intravenous contrast. Carotid stenosis measurements (when applicable) are obtained utilizing NASCET criteria, using the distal internal carotid diameter as the denominator. CONTRAST:  20mL MULTIHANCE GADOBENATE DIMEGLUMINE 529 MG/ML IV SOLN COMPARISON:  CT HEAD October 12, 2016 FINDINGS: MRI HEAD FINDINGS BRAIN: 2 x 3 cm area reduced diffusion LEFT mesial posterior frontal lobe. subcentimeter foci of reduced diffusion posterior to the dominant area, within LEFT mesial frontal and parietal lobes, with corresponding low ADC values and slight bright FLAIR T2 signal. No susceptibility artifact to suggest hemorrhage. A few scattered subcentimeter supratentorial white matter FLAIR T2 hyperintensities. The ventricles and sulci are normal for patient's age. No suspicious parenchymal signal, masses or mass effect. No abnormal extra-axial fluid collections. VASCULAR: Normal major intracranial vascular flow voids present at skull base. SKULL AND UPPER CERVICAL SPINE: No abnormal sellar expansion. No suspicious calvarial bone marrow signal. Craniocervical junction maintained. SINUSES/ORBITS: The mastoid air-cells and included paranasal sinuses are well-aerated. The included ocular globes and orbital contents are non-suspicious. OTHER: Subcentimeter lymph nodes bilateral parotid glands. MRA HEAD FINDINGS ANTERIOR CIRCULATION: Normal flow related enhancement of the included cervical, petrous, cavernous and supraclinoid internal carotid arteries. Patent anterior communicating artery. Predominately azygos ACA, occluded branch of the callosum marginal mid to distal segment. Normal flow related enhancement of the middle cerebral arteries, including distal segments. No large vessel occlusion, high-grade  stenosis, abnormal luminal irregularity, aneurysm. POSTERIOR CIRCULATION: Codominant vertebral artery's. Basilar artery is patent, with normal flow related enhancement of the main branch vessels. Normal flow related enhancement of the posterior cerebral arteries. No large vessel occlusion, high-grade stenosis, abnormal luminal irregularity, aneurysm. ANATOMIC VARIANTS: None. MRA NECK FINDINGS ANTERIOR CIRCULATION: The common carotid arteries are widely patent bilaterally. The carotid bifurcation are patent bilaterally. Within 5 mm  of the bilateral internal carotid artery origins are 2 mm segments of 50% stenosis by NASCET criteria. No flow limiting stenosis of the cervical internal carotid arteries. POSTERIOR CIRCULATION: Bilateral vertebral arteries are patent to the vertebrobasilar junction. No evidence for atherosclerosis or flow limiting stenosis. Source images and MIP image were reviewed. IMPRESSION: MRI HEAD: Acute nonhemorrhagic LEFT frontoparietal/ACA territory infarcts. Mild chronic small vessel ischemic disease. MRA HEAD: Occluded mid to distal callosomarginal branch of the anterior cerebral artery. Predominately azygos ACA. MRA NECK: 50% stenosis bilateral proximal internal carotid artery origins. Electronically Signed   By: Awilda Metro M.D.   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 Cardiac clearance required prior to possible left carotid endarterectomy. Evidence of systolic LV dysfunction by echocardiography with reduced ejection fraction. Hypertension and hyperlipidemia. EXAM: MYOCARDIAL IMAGING WITH SPECT (REST AND PHARMACOLOGIC-STRESS) GATED LEFT VENTRICULAR WALL MOTION STUDY LEFT VENTRICULAR EJECTION FRACTION TECHNIQUE: Standard myocardial SPECT imaging was performed after resting intravenous injection of 10 mCi Tc-30m tetrofosmin. Subsequently, intravenous  infusion of Lexiscan was performed under the supervision of the Cardiology staff. At peak effect of the drug, 30 mCi Tc-59m tetrofosmin was injected intravenously and standard myocardial SPECT imaging was performed. Quantitative gated imaging was also performed to evaluate left ventricular wall motion, and estimate left ventricular ejection fraction. COMPARISON:  None. FINDINGS: Perfusion: There is mild apical attenuation present on both stress and rest imaging. No evidence of inducible myocardial ischemia. Wall Motion: The left ventricle demonstrates global hypokinesis and mild cavity dilatation. Left Ventricular Ejection Fraction: 34 % End diastolic volume 170 ml End systolic volume 113 ml IMPRESSION: 1. Mild attenuation of the left ventricular apex without evidence of inducible myocardial ischemia. 2. Global left ventricular hypokinesis with LV cavity dilatation. 3. Left ventricular ejection fraction 34% 4. Non invasive risk stratification*: Intermediate *2012 Appropriate Use Criteria for Coronary Revascularization Focused Update: J Am Coll Cardiol. 2012;59(9):857-881. http://content.dementiazones.com.aspx?articleid=[email or web address] Electronically Signed   By: Irish Lack M.D.   On: 10/15/2016 15:01      Ziyon Soltau M.D on 10/16/2016 at 1:07 PM  Between 7am to 7pm - Pager - [phone]  After 7pm go to [email or web address] - password [facility]  [facility] -  Office  [phone]

## 2016-10-16 NOTE — Op Note (Addendum)
\   Patient name: Jason Melendez MRN: [id number] DOB: 04-17-1956 Sex: male  10/12/2016 - 10/16/2016 Pre-operative Diagnosis: symptomatic   left carotid stenosis Post-operative diagnosis:  Same Surgeon:  Durene CalBrabham, Wells Assistants:  S. Margart Sickleshyne, Chris Dickson Procedure:    left carotid Endarterectomy with bovine pericardial patch angioplasty Anesthesia:  General Blood Loss:  See anesthesia record  Findings:  60 %stenosis; Thrombus:  none  Indications:  The patient presented with a left brain stroke.  His symptoms have resolved.  His work up was negative except for a 50% stenosis in the left ICA by CT angio.After a long discussion, we decided to proceed with left CEA  Procedure:  The patient was identified in the holding area and [facility]  The patient was then placed supine on the table.   General endotrachial anesthesia was administered.  The patient was prepped and draped in the usual sterile fashion.  A time out was called and antibiotics were administered.  The incision was made along the anterior border of the left sternocleidomastoid muscle.  Cautery was used to dissect through the subcutaneous tissue.  The platysma muscle was divided with cautery.  The internal jugular vein was exposed along its anterior medial border.  The common facial vein was exposed and then divided between 2-0 silk ties and metal clips.  The common carotid artery was then circumferentially exposed and encircled with an umbilical tape.  The vagus nerve was identified and protected.  Next sharp dissection was used to expose the external carotid artery and the superior thyroid artery.  The were encircled with a blue vessel loop and a 2-0 silk tie respectively.  Finally, the internal carotid was carefully dissected free.  An umbilical tape was placed around the internal carotid artery distal to the diseased segment.  The hypoglossal nerve was visualized throughout and protected.  The patient was given systemic  heparinization.  A bovine carotid patch was selected and prepared on the back table.  A 10 french shunt was also prepared.  After blood pressure readings were appropriate and the heparin had been given time to circulate, the internal carotid artery was occluded with a baby Gregory clamp.  The external and common carotid arteries were then occluded with vascular clamps and the 2-0 tie tightened on the superior thyroid artery.  A #11 blade was used to make an arteriotomy in the common carotid artery.  This was extended with Potts scissors along the anterior and lateral border of the common and internal carotid artery.  Approximately 60% stenosis was identified.  There was no thrombus identified.  The 10 french shunt was not placed, as there was pulsatile backbleeding.  A kleiner kuntz elevator was used to perform endarterectomy.  An eversion endarterectomy was performed in the external carotid artery.  A good distal endpoint was obtained in the internal carotid artery.  The specimen was removed and sent to pathology.  Heparinized saline was used to irrigate the endarterectomized field.  All potential embolic debris was removed.  Bovine pericardial patch angioplasty was then performed using a running 6-0 Prolene.  The common internal and external carotid arteries were all appropriately flushed. The artery was again irrigated with heparin saline.  The anastomosis was then secured. The clamp was first released on the external carotid artery followed by the common carotid artery approximately 30 seconds later, bloodflow was reestablish through the internal carotid artery.  Next, a hand-held  Doppler was used to evaluate the signals in the common,  external, and internal  carotid arteries, all of which had appropriate signals. I then administered  50 mg protamine. The wound was then irrigated.  After hemostasis was achieved, the carotid sheath was reapproximated with 3-0 Vicryl. The  platysma muscle was reapproximated  with running 3-0 Vicryl. The skin  was closed with 4-0 Vicryl. Dermabond was placed on the skin. The  patient was then successfully extubated. His neurologic exam was  similar to his preprocedural exam. The patient was then taken to recovery room  in stable condition. There were no complications.     Disposition:  To PACU in stable condition.  Relevant Operative Details:  Ulcerated plaque with moderate amount of intra-luminal debris, which most likely was the cause of his CVA.  A shunt was not utilized as he had pulsatile backbleeding from the ICA  V. Durene Cal, M.D. [facility] Office: [phone] Pager:  [phone]

## 2016-10-17 LAB — BASIC METABOLIC PANEL
Anion gap: 11 (ref 5–15)
Anion gap: 13 (ref 5–15)
BUN: 11 mg/dL (ref 6–20)
BUN: 10 mg/dL (ref 6–20)
CO2: 24 mmol/L (ref 22–32)
CO2: 22 mmol/L (ref 22–32)
Calcium: 9 mg/dL (ref 8.9–10.3)
Calcium: 9 mg/dL (ref 8.9–10.3)
Chloride: 104 mmol/L (ref 101–111)
Chloride: 104 mmol/L (ref 101–111)
Creatinine, Ser: 0.97 mg/dL (ref 0.61–1.24)
Creatinine, Ser: 0.88 mg/dL (ref 0.61–1.24)
GFR calc Af Amer: >60 mL/min (ref >60)
GFR calc Af Amer: >60 mL/min (ref >60)
GFR calc non Af Amer: >60 mL/min (ref >60)
GFR calc non Af Amer: >60 mL/min (ref >60)
Glucose, Bld: 168 mg/dL — ABNORMAL HIGH (ref 65–99)
Glucose, Bld: 151 mg/dL — ABNORMAL HIGH (ref 65–99)
Potassium: 4.3 mmol/L (ref 3.5–5.1)
Potassium: 4.4 mmol/L (ref 3.5–5.1)
Sodium: 139 mmol/L (ref 135–145)
Sodium: 139 mmol/L (ref 135–145)

## 2016-10-17 LAB — POCT ACTIVATED CLOTTING TIME
Activated Clotting Time: 224 seconds
Activated Clotting Time: 230 seconds

## 2016-10-17 LAB — PROTIME-INR
INR: 1.06
Prothrombin Time: 13.8 seconds (ref 11.4–15.2)

## 2016-10-17 LAB — CBC
HCT: 41.1 % (ref 39.0–52.0)
Hemoglobin: 13.6 g/dL (ref 13.0–17.0)
MCH: 30.4 pg (ref 26.0–34.0)
MCHC: 33.1 g/dL (ref 30.0–36.0)
MCV: 91.7 fL (ref 78.0–100.0)
Platelets: 140 10*3/uL — ABNORMAL LOW (ref 150–400)
RBC: 4.48 MIL/uL (ref 4.22–5.81)
RDW: 13.1 % (ref 11.5–15.5)
WBC: 8.4 10*3/uL (ref 4.0–10.5)

## 2016-10-17 LAB — ABO/RH: ABO/RH(D): A POS

## 2016-10-17 MED ORDER — DEXAMETHASONE 4 MG PO TABS
4.0000 mg | ORAL_TABLET | Freq: Four times a day (QID) | ORAL | Status: AC
  Administered 2016-10-17 (×2): 4 mg via ORAL
  Filled 2016-10-17 (×2): qty 1

## 2016-10-17 MED ORDER — ALPRAZOLAM 0.25 MG PO TABS
0.2500 mg | ORAL_TABLET | Freq: Two times a day (BID) | ORAL | Status: DC | PRN
  Administered 2016-10-17: 0.25 mg via ORAL
  Filled 2016-10-17: qty 1

## 2016-10-17 MED ORDER — CARVEDILOL 6.25 MG PO TABS
6.2500 mg | ORAL_TABLET | Freq: Two times a day (BID) | ORAL | Status: DC
  Administered 2016-10-17 – 2016-10-18 (×2): 6.25 mg via ORAL
  Filled 2016-10-17 (×2): qty 1

## 2016-10-17 MED ORDER — OXYCODONE-ACETAMINOPHEN 5-325 MG PO TABS: 1.0000 | ORAL_TABLET | Freq: Four times a day (QID) | ORAL | 0 refills | Status: DC | PRN

## 2016-10-17 NOTE — Progress Notes (Addendum)
Progress Note 10/17/2016 7:31 AM 1 Day Post-Op  Subjective:  C/o left sided headache and some soreness.  Says his throat is sore when he swallows, but no difficulty swallowing.  He has walked and voided.  afebrile HR 60's-100's NSR 130's-170's systolic 97% RA  Gtts:  none  Vitals:   10/17/16 0100 10/17/16 0300  BP: (!) 137/96 (!) 149/69  Pulse: 79 65  Resp: 17 17  Temp:  98.7 F (37.1 C)     Physical Exam: Neuro:  In tact; tongue is midline.  Moving all extremities equally Lungs:  Non labored Incision:  Clean and dry with some fullness proximally  CBC    Component Value Date/Time   WBC 8.4 10/17/2016 0314   RBC 4.48 10/17/2016 0314   HGB 13.6 10/17/2016 0314   HCT 41.1 10/17/2016 0314   PLT 140 (L) 10/17/2016 0314   MCV 91.7 10/17/2016 0314   MCH 30.4 10/17/2016 0314   MCHC 33.1 10/17/2016 0314   RDW 13.1 10/17/2016 0314   LYMPHSABS 2.1 10/13/2016 0217   MONOABS 0.4 10/13/2016 0217   EOSABS 0.1 10/13/2016 0217   BASOSABS 0.0 10/13/2016 0217    BMET    Component Value Date/Time   NA 139 10/17/2016 0314   K 4.4 10/17/2016 0314   CL 104 10/17/2016 0314   CO2 22 10/17/2016 0314   GLUCOSE 151 (H) 10/17/2016 0314   BUN 10 10/17/2016 0314   CREATININE 0.88 10/17/2016 0314   CREATININE 0.83 10/29/2015 1719   CALCIUM 9.0 10/17/2016 0314   GFRNONAA >60 10/17/2016 0314   GFRAA >60 10/17/2016 0314     Intake/Output Summary (Last 24 hours) at 10/17/16 0731 Last data filed at 10/17/16 9604  Gross per 24 hour  Intake             1400 ml  Output             1620 ml  Net             -220 ml     Assessment/Plan:  This is a 61 y.o. male who is s/p left CEA 1 Day Post-Op  -pt is doing well this am.  Blood pressure is better controlled.  He does have a left sided headache. -pt neuro exam is in tact -pt has ambulated -pt has voided -okay to dc from vascular standpoint. -f/u with Dr. Myra Gianotti in 2 weeks. -(roxicet 5/325 #8 NR given)   Doreatha Massed,  PA-C [facility] [phone]  I have interviewed the patient and examined the patient. I agree with the findings by the PA. He has some left-sided headache. I will give him 2 doses of Decadron. If he is able to ambulate and his blood pressure is under good control he should be ready for discharge from a vascular standpoint today.  Cari Caraway, MD [phone]    --- For VQI Registry use ---  Modified Rankin score at D/C (0-6): Rankin Score=0  IV medication needed for:  1. Hypertension: No 2. Hypotension: No  Post-op Complications: No  1. Post-op CVA or TIA: No  If yes: Event classification (right eye, left eye, right cortical, left cortical, verterobasilar, other): n/a  If yes: Timing of event (intra-op, <6 hrs post-op, >=6 hrs post-op, unknown): n/a  2. CN injury: No  If yes: CN n/a injuried   3. Myocardial infarction: No  If yes: Dx by (EKG or clinical, Troponin): n/a  4.  CHF: No  5.  Dysrhythmia (new): No  6.  Wound infection: No  7. Reperfusion symptoms: No  8. Return to OR: No  If yes: return to OR for (bleeding, neurologic, other CEA incision, other): n/a  Discharge medications: Statin use:  Yes ASA use:  Yes Beta blocker use:  Yes ACE-Inhibitor use:  No ARB use:  Yes P2Y12 Antagonist use: x[ ]  None, [ ]  Plavix, [ ]  Plasugrel, [ ]  Ticlopinine, [ ]  Ticagrelor, [ ]  Other, [ ]  No for medical reason, [ ]  Non-compliant, [ ]  Not-indicated Anti-coagulant use:  [ x] None, [ ]  Warfarin, [ ]  Rivaroxaban, [ ]  Dabigatran, [ ]  Other, [ ]  No for medical reason, [ ]  Non-compliant, [ ]  Not-indicated

## 2016-10-17 NOTE — Progress Notes (Signed)
PROGRESS NOTE                                                                                                                                                                                                             Patient Demographics:    Jason Melendez, is a 61 y.o. male, DOB - [birth year], JYN:[id number]  Admit date - 10/12/2016   Admitting Physician Clydie Braun, MD  Outpatient Primary MD for the patient is No PCP Per Patient  LOS - 4   No chief complaint on file.      Brief Narrative  61 year old male with history of hypertension, presents with right-sided weakness, workup significant for acute left frontoparietal CVA.Felt secondary to ICA stenosis, went for left ICA 2/16  after patient cleared by cardiology.   Subjective:    Jason Melendez today has, No chest pain, No abdominal pain - Denies any further deficits or weeknesss.Marland KitchenHe reports some left-sided headache today  Assessment  & Plan :    Principal Problem:   CVA (cerebral vascular accident) (HCC) Active Problems:   HLD (hyperlipidemia)   Obesity (BMI 30.0-34.9)   Stenosis of left carotid artery   Cardiomyopathy (HCC)  Acute CVA  - MRI brain significant for left frontoparietal acute CVA . - MRA head with Occluded mid to distal callosomarginal branch of th eanterior cerebral artery. Predominately azygos ACA. - MRA NECK: 50% stenosis bilateral proximal internal carotid artery - LDL is 109, started on pravastatin - CTA of neck with 50% left ICA/CCA stenosis with calcification and soft plaques, vascular surgery consulted per neuro recommendation, S/P left CEA as  left carotid  Stenosis felt to be the most likely cause to his CVA -2-D echo with EF 40-45% with diffuse hypokinesis, intermediate risk stress test -Doppler with no evidence of DVT - Continue with full dose aspirin  Hypertension - Lowering initially for permissive hypertension, medication titrated up gradually for optimal  control, Coreg and losartan dose has been increased today .   Chronic systolic CHF - EF at 40-45% and diffuse hypokinesis,Myoview perfusion stress test is intermediate risk - Continue with Coreg and losartan, will consider spironolactone  Hyperlipidemia - Patient reports he stopped taking statin at home, resumed on atorvastatin, LDL of 109  Left ICA stenosis - Status post left CEA by Dr. Myra Gianotti 2/16  Code Status : Full  Family Communication  :  daughter at bedside   Disposition Plan  : Home in a.m.  Consults  :  neurology, cardiology, neurosurgery  Procedures  : Status post left CEA by Dr. Myra Gianotti 2/16  DVT Prophylaxis  :  Lovenox   Lab Results  Component Value Date   PLT 140 (L) 10/17/2016    Antibiotics  :    Anti-infectives    Start     Dose/Rate Route Frequency Ordered Stop   10/16/16 2000  cefUROXime (ZINACEF) 1.5 g in dextrose 5 % 50 mL IVPB     1.5 g 100 mL/hr over 30 Minutes Intravenous Every 12 hours 10/16/16 1416 10/17/16 0944   10/16/16 1830  ceFAZolin (ANCEF) IVPB 1 g/50 mL premix  Status:  Discontinued    Comments:  Send with pt to OR   1 g 100 mL/hr over 30 Minutes Intravenous On call 10/16/16 1829 10/16/16 1834   10/16/16 1031  ceFAZolin (ANCEF) 2-4 GM/100ML-% IVPB    Comments:  Marrianne Mood   : cabinet override      10/16/16 1031 10/16/16 2244   10/16/16 0745  ceFAZolin (ANCEF) IVPB 1 g/50 mL premix    Comments:  Send with pt to OR   1 g 100 mL/hr over 30 Minutes Intravenous To Short Stay 10/16/16 0744 10/16/16 0922        Objective:   Vitals:   10/17/16 0300 10/17/16 0800 10/17/16 0900 10/17/16 1040  BP: (!) 149/69  (!) 160/80 (!) 157/73  Pulse: 65  83 90  Resp: 17  15 (!) 25  Temp: 98.7 F (37.1 C) 98.5 F (36.9 C)    TempSrc: Oral Oral    SpO2: 95%  98% 98%  Weight:      Height:        Wt Readings from Last 3 Encounters:  10/13/16 120.7 kg (266 lb)  10/12/16 122.5 kg (270 lb)  10/29/15 109.8 kg (242 lb)      Intake/Output Summary (Last 24 hours) at 10/17/16 1114 Last data filed at 10/17/16 1040  Gross per 24 hour  Intake             1880 ml  Output             1820 ml  Net               60 ml     Physical Exam  Awake Alert, Oriented X 3, No new F.N deficits, Normal affect [place].AT,PERRAL Supple Neck,No JVD,Left neck surgical wound, clean, no oozing or discharge Symmetrical Chest wall movement, Good air movement bilaterally, CTAB RRR,No Gallops,Rubs or new Murmurs, No Parasternal Heave +ve B.Sounds, Abd Soft, No tenderness,No rebound - guarding or rigidity. No Cyanosis, Clubbing or edema,No deficits    Data Review:    CBC  Recent Labs Lab 10/12/16 1949 10/13/16 0217 10/14/16 0553 10/15/16 0250 10/16/16 0304 10/16/16 2314 10/17/16 0314  WBC 6.4 8.0 7.6 6.3 6.6 7.8 8.4  HGB 14.8 14.1 14.4 13.0 13.4 14.2 13.6  HCT 44.5 42.4 44.3 40.2 40.9 42.0 41.1  PLT 145* 137* 140* 132* 130* 146* 140*  MCV 91.2 91.8 92.7 92.0 91.3 91.1 91.7  MCH 30.3 30.5 30.1 29.7 29.9 30.8 30.4  MCHC 33.3 33.3 32.5 32.3 32.8 33.8 33.1  RDW 13.1 13.1 13.4 13.1 13.1 13.0 13.1  LYMPHSABS 2.4 2.1  --   --   --   --   --   MONOABS 0.4 0.4  --   --   --   --   --  EOSABS 0.1 0.1  --   --   --   --   --   BASOSABS 0.0 0.0  --   --   --   --   --     Chemistries   Recent Labs Lab 10/12/16 2322  10/14/16 0553 10/15/16 0250 10/16/16 0304 10/16/16 2314 10/17/16 0314  NA  --   < > 139 139 140 139 139  K  --   < > 4.0 3.7 4.0 4.3 4.4  CL  --   < > 105 105 106 104 104  CO2  --   < > 22 27 26 24 22   GLUCOSE  --   < > 137* 142* 133* 168* 151*  BUN  --   < > 9 11 13 11 10   CREATININE  --   < > 1.02 1.06 1.09 0.97 0.88  CALCIUM  --   < > 9.0 8.9 9.1 9.0 9.0  AST 34  --   --   --   --   --   --   ALT 18  --   --   --   --   --   --   ALKPHOS 34*  --   --   --   --   --   --   BILITOT 0.9  --   --   --   --   --   --   < > = values in this interval not  displayed. ------------------------------------------------------------------------------------------------------------------ No results for input(s): CHOL, HDL, LDLCALC, TRIG, CHOLHDL, LDLDIRECT in the last 72 hours.  Lab Results  Component Value Date   HGBA1C 6.4 (H) 10/13/2016   ------------------------------------------------------------------------------------------------------------------ No results for input(s): TSH, T4TOTAL, T3FREE, THYROIDAB in the last 72 hours.  Invalid input(s): FREET3 ------------------------------------------------------------------------------------------------------------------ No results for input(s): VITAMINB12, FOLATE, FERRITIN, TIBC, IRON, RETICCTPCT in the last 72 hours.  Coagulation profile  Recent Labs Lab 10/12/16 2058 10/16/16 2314 10/17/16 0314  INR 1.05 1.12 1.06    No results for input(s): DDIMER in the last 72 hours.  Cardiac Enzymes No results for input(s): CKMB, TROPONINI, MYOGLOBIN in the last 168 hours.  Invalid input(s): CK ------------------------------------------------------------------------------------------------------------------    Component Value Date/Time   BNP 27.0 10/12/2016 1949    Inpatient Medications  Scheduled Meds: . aspirin  325 mg Oral Daily  . atorvastatin  40 mg Oral q1800  . carvedilol  6.25 mg Oral BID WC  . dexamethasone  4 mg Oral Q6H  . docusate sodium  100 mg Oral Daily  . enoxaparin (LOVENOX) injection  40 mg Subcutaneous Q24H  . losartan  50 mg Oral Daily  . pantoprazole  40 mg Oral Daily   Continuous Infusions: . sodium chloride Stopped (10/17/16 1050)   PRN Meds:.sodium chloride, acetaminophen **OR** acetaminophen (TYLENOL) oral liquid 160 mg/5 mL **OR** acetaminophen, ALPRAZolam, alum & mag hydroxide-simeth, bisacodyl, guaiFENesin-dextromethorphan, hydrALAZINE, labetalol, magnesium sulfate 1 - 4 g bolus IVPB, metoprolol, morphine injection, ondansetron, oxyCODONE-acetaminophen,  phenol, polyethylene glycol, potassium chloride, senna-docusate  Micro Results Recent Results (from the past 240 hour(s))  Surgical pcr screen     Status: None   Collection Time: 10/16/16  5:45 AM  Result Value Ref Range Status   [name], PCR NEGATIVE NEGATIVE Final   Staphylococcus aureus NEGATIVE NEGATIVE Final    Comment:        The Xpert SA Assay (FDA approved for NASAL specimens in patients over 58 years of age), is one component of a comprehensive surveillance program.  Test performance has been validated by [facility] for patients greater than or equal to 53 year old. It is not intended to diagnose infection nor to guide or monitor treatment.     Radiology Reports Dg Chest 2 View  Result Date: 10/12/2016 CLINICAL DATA:  Initial evaluation for acute right-sided chest pain. EXAM: CHEST  2 VIEW COMPARISON:  None. FINDINGS: The cardiac and mediastinal silhouettes are within normal limits. The lungs are normally inflated. No airspace consolidation, pleural effusion, or pulmonary edema is identified. There is no pneumothorax. No acute osseous abnormality identified. IMPRESSION: No active cardiopulmonary disease. Electronically Signed   By: Rise Mu M.D.   On: 10/12/2016 21:33   Ct Head Wo Contrast  Result Date: 10/12/2016 CLINICAL DATA:  Right leg and arm weakness.  Hypertension. EXAM: CT HEAD WITHOUT CONTRAST TECHNIQUE: Contiguous axial images were obtained from the base of the skull through the vertex without intravenous contrast. COMPARISON:  None. FINDINGS: Brain: No mass lesion, intraparenchymal hemorrhage or extra-axial collection. No evidence of acute cortical infarct. Brain parenchyma and CSF-containing spaces are normal for age. Left parafalcine arachnoid cyst. Vascular: No hyperdense vessel or unexpected calcification. Skull: Normal visualized skull base, calvarium and extracranial soft tissues. Sinuses/Orbits: No sinus fluid levels or advanced mucosal thickening.  No mastoid effusion. Normal orbits. IMPRESSION: No acute intracranial abnormality. Electronically Signed   By: Deatra Robinson M.D.   On: 10/12/2016 21:12   Ct Angio Neck W Or Wo Contrast  Result Date: 10/13/2016 CLINICAL DATA:  61 year old male with left ACA infarct discovered during evaluation of confusion, right side weakness for 2 days. Initial encounter. EXAM: CT ANGIOGRAPHY NECK TECHNIQUE: Multidetector CT imaging of the neck was performed using the standard protocol during bolus administration of intravenous contrast. Multiplanar CT image reconstructions and MIPs were obtained to evaluate the vascular anatomy. Carotid stenosis measurements (when applicable) are obtained utilizing NASCET criteria, using the distal internal carotid diameter as the denominator. CONTRAST:  50 mL Isovue 370 COMPARISON:  Brain MRI and MRA head and neck 0050 hours today. FINDINGS: Skeleton: Carious posterior dentition. No acute osseous abnormality identified. Visualized paranasal sinuses and mastoids are stable and well pneumatized. Upper chest: Superior mediastinal lipomatosis. No superior mediastinal lymphadenopathy. Negative visualized lung parenchyma. Other neck: Negative thyroid, larynx, pharynx, parapharyngeal spaces, retropharyngeal space, sublingual space, submandibular glands and parotid glands. No cervical lymphadenopathy. Aortic arch: 3 vessel arch configuration. Mild distal arch calcified atherosclerosis. Right carotid system: No brachiocephalic artery or right CCA origin stenosis. Mildly tortuous proximal right CCA. Soft plaque along the anterior right CCA without stenosis (series 401, image 74). Soft and calcified plaque at the right carotid bifurcation with no proximal right ICA stenosis (series 404, image 93). Severely tortuous mid cervical right ICA. Otherwise negative right ICA to the level of the distal petrous segment. Left carotid system: No left CCA origin stenosis. Tortuous proximal left CCA. Minimal soft  plaque proximal to the left carotid bifurcation. At the left carotid bifurcation there is low-density soft and calcified plaque affecting the left ICA origin and bulb. Subsequent stenosis is up to 50 % with respect to the distal vessel in the proximal left ICA. Mildly tortuous more distal cervical left ICA. Visible left siphon is patent with mild calcified plaque. Vertebral arteries:No proximal right subclavian artery stenosis. Right vertebral artery origin appears normal. There is a late entry of the right vertebral artery into the cervical transverse foramen. The right vertebral artery is mildly dominant. No right vertebral artery stenosis to the vertebrobasilar junction. Visible  basilar artery is unremarkable. No proximal left subclavian artery stenosis despite mild plaque. The left vertebral artery origin is within normal limits. Tortuous left V1 segment. The left vertebral artery is mildly non dominant and patent to the vertebrobasilar junction without stenosis. Normal left PICA origin. IMPRESSION: 1. Soft and calcified bilateral carotid atherosclerosis in the neck with no hemodynamically significant stenosis; 50% proximal left ICA stenosis. 2. Dominant right vertebral artery with no definite vertebral artery stenosis. 3. Carious posterior dentition. Electronically Signed   By: Odessa FlemingH  Hall M.D.   On: 10/13/2016 13:01   Mr Maxine GlennMra Head Wo Contrast  Result Date: 10/13/2016 CLINICAL DATA:  RIGHT-sided weakness for 2 days. Hypertensive, RIGHT-sided chest pain and elevated D-dimer. Confusion. Assess stroke. EXAM: MRI HEAD WITHOUT CONTRAST MRA HEAD WITHOUT CONTRAST MRA NECK WITHOUT AND WITH CONTRAST TECHNIQUE: Multiplanar, multiecho pulse sequences of the brain and surrounding structures were obtained without intravenous contrast. Angiographic images of the Circle of Willis were obtained using MRA technique without intravenous contrast. Angiographic images of the neck were obtained using MRA technique without and with  intravenous contrast. Carotid stenosis measurements (when applicable) are obtained utilizing NASCET criteria, using the distal internal carotid diameter as the denominator. CONTRAST:  20mL MULTIHANCE GADOBENATE DIMEGLUMINE 529 MG/ML IV SOLN COMPARISON:  CT HEAD October 12, 2016 FINDINGS: MRI HEAD FINDINGS BRAIN: 2 x 3 cm area reduced diffusion LEFT mesial posterior frontal lobe. subcentimeter foci of reduced diffusion posterior to the dominant area, within LEFT mesial frontal and parietal lobes, with corresponding low ADC values and slight bright FLAIR T2 signal. No susceptibility artifact to suggest hemorrhage. A few scattered subcentimeter supratentorial white matter FLAIR T2 hyperintensities. The ventricles and sulci are normal for patient's age. No suspicious parenchymal signal, masses or mass effect. No abnormal extra-axial fluid collections. VASCULAR: Normal major intracranial vascular flow voids present at skull base. SKULL AND UPPER CERVICAL SPINE: No abnormal sellar expansion. No suspicious calvarial bone marrow signal. Craniocervical junction maintained. SINUSES/ORBITS: The mastoid air-cells and included paranasal sinuses are well-aerated. The included ocular globes and orbital contents are non-suspicious. OTHER: Subcentimeter lymph nodes bilateral parotid glands. MRA HEAD FINDINGS ANTERIOR CIRCULATION: Normal flow related enhancement of the included cervical, petrous, cavernous and supraclinoid internal carotid arteries. Patent anterior communicating artery. Predominately azygos ACA, occluded branch of the callosum marginal mid to distal segment. Normal flow related enhancement of the middle cerebral arteries, including distal segments. No large vessel occlusion, high-grade stenosis, abnormal luminal irregularity, aneurysm. POSTERIOR CIRCULATION: Codominant vertebral artery's. Basilar artery is patent, with normal flow related enhancement of the main branch vessels. Normal flow related enhancement of the  posterior cerebral arteries. No large vessel occlusion, high-grade stenosis, abnormal luminal irregularity, aneurysm. ANATOMIC VARIANTS: None. MRA NECK FINDINGS ANTERIOR CIRCULATION: The common carotid arteries are widely patent bilaterally. The carotid bifurcation are patent bilaterally. Within 5 mm of the bilateral internal carotid artery origins are 2 mm segments of 50% stenosis by NASCET criteria. No flow limiting stenosis of the cervical internal carotid arteries. POSTERIOR CIRCULATION: Bilateral vertebral arteries are patent to the vertebrobasilar junction. No evidence for atherosclerosis or flow limiting stenosis. Source images and MIP image were reviewed. IMPRESSION: MRI HEAD: Acute nonhemorrhagic LEFT frontoparietal/ACA territory infarcts. Mild chronic small vessel ischemic disease. MRA HEAD: Occluded mid to distal callosomarginal branch of the anterior cerebral artery. Predominately azygos ACA. MRA NECK: 50% stenosis bilateral proximal internal carotid artery origins. Electronically Signed   By: Awilda Metroourtnay  Bloomer M.D.   On: 10/13/2016 02:35   Mr Angiogram Neck W Or Wo  Contrast  Result Date: 10/13/2016 CLINICAL DATA:  RIGHT-sided weakness for 2 days. Hypertensive, RIGHT-sided chest pain and elevated D-dimer. Confusion. Assess stroke. EXAM: MRI HEAD WITHOUT CONTRAST MRA HEAD WITHOUT CONTRAST MRA NECK WITHOUT AND WITH CONTRAST TECHNIQUE: Multiplanar, multiecho pulse sequences of the brain and surrounding structures were obtained without intravenous contrast. Angiographic images of the Circle of Willis were obtained using MRA technique without intravenous contrast. Angiographic images of the neck were obtained using MRA technique without and with intravenous contrast. Carotid stenosis measurements (when applicable) are obtained utilizing NASCET criteria, using the distal internal carotid diameter as the denominator. CONTRAST:  20mL MULTIHANCE GADOBENATE DIMEGLUMINE 529 MG/ML IV SOLN COMPARISON:  CT HEAD  October 12, 2016 FINDINGS: MRI HEAD FINDINGS BRAIN: 2 x 3 cm area reduced diffusion LEFT mesial posterior frontal lobe. subcentimeter foci of reduced diffusion posterior to the dominant area, within LEFT mesial frontal and parietal lobes, with corresponding low ADC values and slight bright FLAIR T2 signal. No susceptibility artifact to suggest hemorrhage. A few scattered subcentimeter supratentorial white matter FLAIR T2 hyperintensities. The ventricles and sulci are normal for patient's age. No suspicious parenchymal signal, masses or mass effect. No abnormal extra-axial fluid collections. VASCULAR: Normal major intracranial vascular flow voids present at skull base. SKULL AND UPPER CERVICAL SPINE: No abnormal sellar expansion. No suspicious calvarial bone marrow signal. Craniocervical junction maintained. SINUSES/ORBITS: The mastoid air-cells and included paranasal sinuses are well-aerated. The included ocular globes and orbital contents are non-suspicious. OTHER: Subcentimeter lymph nodes bilateral parotid glands. MRA HEAD FINDINGS ANTERIOR CIRCULATION: Normal flow related enhancement of the included cervical, petrous, cavernous and supraclinoid internal carotid arteries. Patent anterior communicating artery. Predominately azygos ACA, occluded branch of the callosum marginal mid to distal segment. Normal flow related enhancement of the middle cerebral arteries, including distal segments. No large vessel occlusion, high-grade stenosis, abnormal luminal irregularity, aneurysm. POSTERIOR CIRCULATION: Codominant vertebral artery's. Basilar artery is patent, with normal flow related enhancement of the main branch vessels. Normal flow related enhancement of the posterior cerebral arteries. No large vessel occlusion, high-grade stenosis, abnormal luminal irregularity, aneurysm. ANATOMIC VARIANTS: None. MRA NECK FINDINGS ANTERIOR CIRCULATION: The common carotid arteries are widely patent bilaterally. The carotid  bifurcation are patent bilaterally. Within 5 mm of the bilateral internal carotid artery origins are 2 mm segments of 50% stenosis by NASCET criteria. No flow limiting stenosis of the cervical internal carotid arteries. POSTERIOR CIRCULATION: Bilateral vertebral arteries are patent to the vertebrobasilar junction. No evidence for atherosclerosis or flow limiting stenosis. Source images and MIP image were reviewed. IMPRESSION: MRI HEAD: Acute nonhemorrhagic LEFT frontoparietal/ACA territory infarcts. Mild chronic small vessel ischemic disease. MRA HEAD: Occluded mid to distal callosomarginal branch of the anterior cerebral artery. Predominately azygos ACA. MRA NECK: 50% stenosis bilateral proximal internal carotid artery origins. Electronically Signed   By: Awilda Metro M.D.   On: 10/13/2016 02:35   Mr Brain Wo Contrast  Result Date: 10/13/2016 CLINICAL DATA:  RIGHT-sided weakness for 2 days. Hypertensive, RIGHT-sided chest pain and elevated D-dimer. Confusion. Assess stroke. EXAM: MRI HEAD WITHOUT CONTRAST MRA HEAD WITHOUT CONTRAST MRA NECK WITHOUT AND WITH CONTRAST TECHNIQUE: Multiplanar, multiecho pulse sequences of the brain and surrounding structures were obtained without intravenous contrast. Angiographic images of the Circle of Willis were obtained using MRA technique without intravenous contrast. Angiographic images of the neck were obtained using MRA technique without and with intravenous contrast. Carotid stenosis measurements (when applicable) are obtained utilizing NASCET criteria, using the distal internal carotid diameter as the denominator. CONTRAST:  20mL MULTIHANCE GADOBENATE DIMEGLUMINE 529 MG/ML IV SOLN COMPARISON:  CT HEAD October 12, 2016 FINDINGS: MRI HEAD FINDINGS BRAIN: 2 x 3 cm area reduced diffusion LEFT mesial posterior frontal lobe. subcentimeter foci of reduced diffusion posterior to the dominant area, within LEFT mesial frontal and parietal lobes, with corresponding low ADC  values and slight bright FLAIR T2 signal. No susceptibility artifact to suggest hemorrhage. A few scattered subcentimeter supratentorial white matter FLAIR T2 hyperintensities. The ventricles and sulci are normal for patient's age. No suspicious parenchymal signal, masses or mass effect. No abnormal extra-axial fluid collections. VASCULAR: Normal major intracranial vascular flow voids present at skull base. SKULL AND UPPER CERVICAL SPINE: No abnormal sellar expansion. No suspicious calvarial bone marrow signal. Craniocervical junction maintained. SINUSES/ORBITS: The mastoid air-cells and included paranasal sinuses are well-aerated. The included ocular globes and orbital contents are non-suspicious. OTHER: Subcentimeter lymph nodes bilateral parotid glands. MRA HEAD FINDINGS ANTERIOR CIRCULATION: Normal flow related enhancement of the included cervical, petrous, cavernous and supraclinoid internal carotid arteries. Patent anterior communicating artery. Predominately azygos ACA, occluded branch of the callosum marginal mid to distal segment. Normal flow related enhancement of the middle cerebral arteries, including distal segments. No large vessel occlusion, high-grade stenosis, abnormal luminal irregularity, aneurysm. POSTERIOR CIRCULATION: Codominant vertebral artery's. Basilar artery is patent, with normal flow related enhancement of the main branch vessels. Normal flow related enhancement of the posterior cerebral arteries. No large vessel occlusion, high-grade stenosis, abnormal luminal irregularity, aneurysm. ANATOMIC VARIANTS: None. MRA NECK FINDINGS ANTERIOR CIRCULATION: The common carotid arteries are widely patent bilaterally. The carotid bifurcation are patent bilaterally. Within 5 mm of the bilateral internal carotid artery origins are 2 mm segments of 50% stenosis by NASCET criteria. No flow limiting stenosis of the cervical internal carotid arteries. POSTERIOR CIRCULATION: Bilateral vertebral arteries  are patent to the vertebrobasilar junction. No evidence for atherosclerosis or flow limiting stenosis. Source images and MIP image were reviewed. IMPRESSION: MRI HEAD: Acute nonhemorrhagic LEFT frontoparietal/ACA territory infarcts. Mild chronic small vessel ischemic disease. MRA HEAD: Occluded mid to distal callosomarginal branch of the anterior cerebral artery. Predominately azygos ACA. MRA NECK: 50% stenosis bilateral proximal internal carotid artery origins. Electronically Signed   By: Awilda Metro M.D.   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 Cardiac clearance required prior to possible left carotid endarterectomy. Evidence of systolic LV dysfunction by echocardiography with reduced ejection fraction. Hypertension and hyperlipidemia. EXAM: MYOCARDIAL IMAGING WITH SPECT (REST AND PHARMACOLOGIC-STRESS) GATED LEFT VENTRICULAR WALL MOTION STUDY LEFT VENTRICULAR EJECTION FRACTION TECHNIQUE: Standard myocardial SPECT imaging was performed after resting intravenous injection of 10 mCi Tc-4m tetrofosmin. Subsequently, intravenous infusion of Lexiscan was performed under the supervision of the Cardiology staff. At peak effect of the drug, 30 mCi Tc-68m tetrofosmin was injected intravenously and standard myocardial SPECT imaging was performed. Quantitative gated imaging was also performed to evaluate left ventricular wall motion, and estimate left ventricular ejection fraction. COMPARISON:  None. FINDINGS: Perfusion: There is mild apical attenuation present on both stress and rest imaging. No evidence of inducible myocardial ischemia. Wall Motion: The left ventricle demonstrates global hypokinesis and mild cavity dilatation. Left Ventricular Ejection Fraction: 34 % End diastolic volume 170 ml End systolic volume 113 ml IMPRESSION: 1. Mild attenuation of the left ventricular apex  without evidence of inducible myocardial ischemia. 2. Global left ventricular hypokinesis with LV cavity dilatation. 3. Left ventricular ejection fraction  34% 4. Non invasive risk stratification*: Intermediate *2012 Appropriate Use Criteria for Coronary Revascularization Focused Update: J Am Coll Cardiol. 2012;59(9):857-881. http://content.[email or web address].com.aspx?articleid=[email or web address] Electronically Signed   By: Irish Lack M.D.   On: 10/15/2016 15:01      Kataya Guimont M.D on 10/17/2016 at 11:14 AM  Between 7am to 7pm - Pager - [phone]  After 7pm go to [email or web address] - password [facility]  [facility] -  Office  [phone]

## 2016-10-17 NOTE — Progress Notes (Signed)
Subjective:  Tolerated carotid endarterectomy well yesterday, and no complaints of shortness of breath, complains of fullness on the left side of his face and neck and mild throat soreness.  Objective:  Vital Signs in the last 24 hours: BP (!) 149/69 (BP Location: Left Arm)   Pulse 65   Temp 98.5 F (36.9 C) (Oral)   Resp 17   Ht 6' (1.829 m)   Wt 120.7 kg (266 lb)   SpO2 95%   BMI 36.08 kg/m   Physical Exam: Pleasant obese male in no acute distress Neck: Previous carotid endarterectomy site mild swelling Lungs:  Clear  Cardiac:  Regular rhythm, normal S1 and S2, no S3 Abdomen:  Obese, Soft, nontender, no masses Extremities:  No edema present  Intake/Output from previous day: 02/16 0701 - 02/17 0700 In: 1400 [I.V.:1400] Out: 1620 [Urine:1600; Blood:20] Weight Filed Weights   10/12/16 1937 10/13/16 0236  Weight: 120.2 kg (265 lb) 120.7 kg (266 lb)    Lab Results: Basic Metabolic Panel:  Recent Labs  16/05/9601/16/18 2314 10/17/16 0314  NA 139 139  K 4.3 4.4  CL 104 104  CO2 24 22  GLUCOSE 168* 151*  BUN 11 10  CREATININE 0.97 0.88    CBC:  Recent Labs  10/16/16 2314 10/17/16 0314  WBC 7.8 8.4  HGB 14.2 13.6  HCT 42.0 41.1  MCV 91.1 91.7  PLT 146* 140*   BNP    Component Value Date/Time   BNP 27.0 10/12/2016 1949   Telemetry: Personally reviewed by me Sinus rhythm with PVCs some in bigeminy  Assessment/Plan:  1.  Hypertensive heart disease blood pressure still elevated but with previous acute stroke will have gradual lowering with permissive hypertension-we'll titrate blood pressure medicines per neurology guidance 2.  Chronic systolic heart failure likely due to untreated hypertension 3.  Previous acute left brain CVA 4.  PVCs  Recommendations:  Has done well with his vascular surgery.  Would continue to up titrate blood pressure medicines as he recovers from his stroke.  Would titrate ace as well as beta blockers and eventually add  spironolactone.  Cardiology follow-up in 2 weeks with Dr. Katrinka BlazingSmith.     Darden PalmerW. Spencer Maeryn Mcgath, Jr.  MD [facility]  10/17/2016, 9:33 AM

## 2016-10-18 MED ORDER — LOSARTAN POTASSIUM 50 MG PO TABS: 100.0000 mg | ORAL_TABLET | Freq: Every day | ORAL | Status: DC

## 2016-10-18 MED ORDER — LOSARTAN POTASSIUM 100 MG PO TABS: 100.0000 mg | ORAL_TABLET | Freq: Every day | ORAL | 0 refills | Status: DC

## 2016-10-18 MED ORDER — DOCUSATE SODIUM 100 MG PO CAPS: 100.0000 mg | ORAL_CAPSULE | Freq: Every day | ORAL | 0 refills | Status: DC

## 2016-10-18 MED ORDER — CARVEDILOL 6.25 MG PO TABS: 6.2500 mg | ORAL_TABLET | Freq: Two times a day (BID) | ORAL | 0 refills | Status: DC

## 2016-10-18 MED ORDER — ASPIRIN 325 MG PO TABS: 325.0000 mg | ORAL_TABLET | Freq: Every day | ORAL | 0 refills | Status: AC

## 2016-10-18 MED ORDER — ATORVASTATIN CALCIUM 40 MG PO TABS: 40.0000 mg | ORAL_TABLET | Freq: Every day | ORAL | 1 refills | Status: DC

## 2016-10-18 NOTE — Progress Notes (Addendum)
  Progress Note    10/18/2016 7:34 AM 2 Days Post-Op  Subjective:  Headache resolved.  Very appreciate for staff  afebrile HR  60's-80's NSR 130's-170's systolic 97% RA  Vitals:   10/18/16 0319 10/18/16 0723  BP: (!) 153/78 (!) 170/87  Pulse: 72 80  Resp: 19 19  Temp: 98.6 F (37 C) 98.5 F (36.9 C)     Physical Exam: Neuro:  In tact; tongue is midline;  Lungs:  Non labored Incision:  Clean and dry  CBC    Component Value Date/Time   WBC 8.4 10/17/2016 0314   RBC 4.48 10/17/2016 0314   HGB 13.6 10/17/2016 0314   HCT 41.1 10/17/2016 0314   PLT 140 (L) 10/17/2016 0314   MCV 91.7 10/17/2016 0314   MCH 30.4 10/17/2016 0314   MCHC 33.1 10/17/2016 0314   RDW 13.1 10/17/2016 0314   LYMPHSABS 2.1 10/13/2016 0217   MONOABS 0.4 10/13/2016 0217   EOSABS 0.1 10/13/2016 0217   BASOSABS 0.0 10/13/2016 0217    BMET    Component Value Date/Time   NA 139 10/17/2016 0314   K 4.4 10/17/2016 0314   CL 104 10/17/2016 0314   CO2 22 10/17/2016 0314   GLUCOSE 151 (H) 10/17/2016 0314   BUN 10 10/17/2016 0314   CREATININE 0.88 10/17/2016 0314   CREATININE 0.83 10/29/2015 1719   CALCIUM 9.0 10/17/2016 0314   GFRNONAA >60 10/17/2016 0314   GFRAA >60 10/17/2016 0314     Intake/Output Summary (Last 24 hours) at 10/18/16 0734 Last data filed at 10/18/16 [id number]  Gross per 24 hour  Intake          8336.25 ml  Output             1750 ml  Net          6586.25 ml     Assessment/Plan:  This is a 61 y.o. male who is s/p left CEA 2 Days Post-Op  -pt is doing well this am.  BP elevated this am -pt neuro exam is in tact -pt has ambulated -pt has voided -f/u with Dr. Myra GianottiBrabham in 2 weeks. -okay to discharge from vascular standpoint.  Doreatha MassedSamantha Rhyne, PA-C [facility] [phone]  I have interviewed the patient and examined the patient. I agree with the findings by the PA. HA resolved. Feels much better Home today.   Cari Carawayhris Dequane Strahan, MD [phone]

## 2016-10-18 NOTE — Progress Notes (Signed)
Reviewed discharge papers with pt and his spouse including follow up appts, medications and s/s of infection and incision care. Pt and spouse verbalize understanding and able to reteach s/s of cva and when to call 911. Pt discharged with spouse via wheelchair. Marisue Ivanobyn Dezzie Badilla RN

## 2016-10-18 NOTE — Discharge Summary (Signed)
Jason Melendez, is a 61 y.o. male  DOB Apr 14, 1956  MRN [id number].  Admission date:  10/12/2016  Admitting Physician  Clydie Braun, MD  Discharge Date:  10/18/2016   Primary MD  No PCP Per Patient  Recommendations for primary care physician for things to follow:  - Please check CBC, BMP during next visit as patient started on losartan - Patient to follow with vascular surgery, cardiology and neurology as an outpatient   Admission Diagnosis  Weakness of right side of body [R53.1] Right sided weakness [R53.1] Cerebrovascular accident (CVA), unspecified mechanism (HCC) [I63.9]   Discharge Diagnosis  Weakness of right side of body [R53.1] Right sided weakness [R53.1] Cerebrovascular accident (CVA), unspecified mechanism (HCC) [I63.9]    Principal Problem:   CVA (cerebral vascular accident) (HCC) Active Problems:   HLD (hyperlipidemia)   Obesity (BMI 30.0-34.9)   Stenosis of left carotid artery   Cardiomyopathy [facility])      Past Medical History:  Diagnosis Date  . HTN (hypertension)     Past Surgical History:  Procedure Laterality Date  . HERNIA REPAIR         History of present illness and  Hospital Course:     Kindly see H&P for history of present illness and admission details, please review complete Labs, Consult reports and Test reports for all details in brief  HPI  from the history and physical done on the day of admission 10/13/2016  HPI: Jason Melendez is a 61 y.o. male with medical history significant of HTN; who presented with a three-day history of intermittent right-sided weakness. At baseline patient is right-handed. He reports symptoms initially feeling as though his right leg and arm didn't belong to him. He found it unusual that his 5 pound work bag felt like it weighed 15 pounds. He intermittently reported dropping things out of his right hand and feeling like  it took extra effort to make a step with the right leg. Associated symptoms included knowing what he wants to say, but being unable to say it. He went to be evaluated today and was noted to have elevated blood pressure into the 200s for which he was sent to the emergency department for further evaluation.   ED Course:   Patient was noted to have blood pressure as high as 220/110 . Patient appears to have been given 5 mg of hydralazine IV. He subsequently developed hypotension 83/57 heart rates in the 30s after replacement of a larger IV. Patient reports feeling hot and flushed prior to passing out. During this acute situation patient developed worsening right-sided weakness. Patient was given atropine and IV fluids with improvement of blood pressures. Lab work revealed platelets 145, and all other labs relatively within normal limits. Initial chest x-ray and CT of the brain showed no acute abnormalities. Neurology was consulted and thought that the initial CT may have shown signs of a subacute stroke. Recommended MRI of the brain.  Hospital Course   61 year old male with history of hypertension, presents with right-sided  weakness, workup significant for acute left frontoparietal CVA.Felt secondary to ICA stenosis, went for left ICA 2/16  after patient cleared by cardiology.   Acute CVA  - MRI brain significant for left frontoparietal acute CVA . - MRA head with Occluded mid to distal callosomarginal branch of th eanterior cerebral artery. Predominately azygos ACA. - MRA NECK: 50% stenosis bilateral proximal internal carotid artery - LDL is 109, started on pravastatin - CTA of neck with 50% left ICA/CCA stenosis with calcification and soft plaques, vascular surgery consulted per neuro recommendation, S/P left CEA as  left carotid  Stenosis felt to be the most likely cause to his CVA -2-D echo with EF 40-45% with diffuse hypokinesis, intermediate risk stress test -Doppler with no evidence of DVT -  Continue with full dose aspirin and statin on discharge   Hypertension - allowing initially for permissive hypertension, medication titrated up gradually for optimal control, Coreg and losartan dose has been increased gradually, we'll discharge on losartan 100 mg oral daily, and Coreg 6.25 mg oral twice a day .   Chronic systolic CHF - EF at 40-45% and diffuse hypokinesis,Myoview perfusion stress test is intermediate risk - Continue with Coreg and losartan.  Hyperlipidemia - Patient reports he stopped taking statin at home, resumed on atorvastatin prior dose, LDL of 109  Left ICA stenosis - Status post left CEA by Dr. Myra Gianotti 2/16, to follow as an outpatient in 2 weeks  Discharge Condition:  Stable   Follow UP  Follow-up Information    Xu,Jindong, MD. Schedule an appointment as soon as possible for a visit in 6 week(s).   Specialty:  Neurology Contact information: [address] [place] [address] [phone]        Durene Cal, MD Follow up in 2 week(s).   Specialties:  Vascular Surgery, Cardiology Why:  Office will call you to arrange your appt (sent) Contact information: [address] [place] [address] [phone]        Lyn Records III, MD. Schedule an appointment as soon as possible for a visit.   Specialty:  Cardiology Why:  Call to get appointment to be seen in 2-4 weeks after discharge Contact information: [address] [place] [address] [phone]             Discharge Instructions  and  Discharge Medications     Discharge Instructions    Ambulatory referral to Neurology    Complete by:  As directed    Pt will follow up with Dr. Roda Shutters at [facility] in about 2 months. Thanks.   CAROTID Sugery: Call MD for difficulty swallowing or speaking; weakness in arms or legs that is a new symtom; severe headache.  If you have increased swelling in the neck and/or  are having difficulty breathing, CALL 911     Complete by:  As directed    Call MD for:  redness, tenderness, or signs of infection (pain, swelling, bleeding, redness, odor or green/yellow discharge around incision site)    Complete by:  As directed    Call MD for:  severe or increased pain, loss or decreased feeling  in affected limb(s)    Complete by:  As directed    Call MD for:  temperature >100.5    Complete by:  As directed    Discharge instructions    Complete by:  As directed    Follow with Primary MD in 7 days   Get CBC, CMP, by Primary MD  next visit.    Activity: As tolerated with Full fall precautions use walker/cane & assistance as needed   Disposition Home    Diet: Heart Healthy    For Heart failure patients - Check your Weight same time everyday, if you gain over 2 pounds, or you develop in leg swelling, experience more shortness of breath or chest pain, call your Primary MD immediately. Follow Cardiac Low Salt Diet and 1.5 lit/day fluid restriction.   On your next visit with your primary care physician please Get Medicines reviewed and adjusted.   Please request your Prim.MD to go over all Hospital Tests and Procedure/Radiological results at the follow up, please get all Hospital records sent to your Prim MD by signing hospital release before you go home.   If you experience worsening of your admission symptoms, develop shortness of breath, life threatening emergency, suicidal or homicidal thoughts you must seek medical attention immediately by calling 911 or calling your MD immediately  if symptoms less severe.  You Must read complete instructions/literature along with all the possible adverse reactions/side effects for all the Medicines you take and that have been prescribed to you. Take any new Medicines after you have completely understood and accpet all the possible adverse reactions/side effects.   Do not drive, operating heavy machinery, perform activities at heights, swimming or participation in water  activities or provide baby sitting services if your were admitted for syncope or siezures until you have seen by Primary MD or a Neurologist and advised to do so again.  Do not drive when taking Pain medications.    Do not take more than prescribed Pain, Sleep and Anxiety Medications  Special Instructions: If you have smoked or chewed Tobacco  in the last 2 yrs please stop smoking, stop any regular Alcohol  and or any Recreational drug use.  Wear Seat belts while driving.   Please note  You were cared for by a hospitalist during your hospital stay. If you have any questions about your discharge medications or the care you received while you were in the hospital after you are discharged, you can call the unit and asked to speak with the hospitalist on call if the hospitalist that took care of you is not available. Once you are discharged, your primary care physician will handle any further medical issues. Please note that NO REFILLS for any discharge medications will be authorized once you are discharged, as it is imperative that you return to your primary care physician (or establish a relationship with a primary care physician if you do not have one) for your aftercare needs so that they can reassess your need for medications and monitor your lab values.   Discharge wound care:    Complete by:  As directed    Shower daily with soap and water starting 10/18/16   Driving Restrictions    Complete by:  As directed    No driving for 2 weeks   Lifting restrictions    Complete by:  As directed    No lifting for 2 weeks     Allergies as of 10/18/2016   No Known Allergies     Medication List    STOP taking these medications   naproxen sodium 220 MG tablet Commonly known as:  ANAPROX     TAKE these medications   aspirin 325 MG tablet Take 1 tablet (325 mg total) by mouth daily. Start taking on:  10/19/2016 What changed:  medication strength  how much to take  atorvastatin 40 MG  tablet Commonly known as:  LIPITOR Take 1 tablet (40 mg total) by mouth daily at 6 PM. What changed:  medication strength  how much to take   carvedilol 6.25 MG tablet Commonly known as:  COREG Take 1 tablet (6.25 mg total) by mouth 2 (two) times daily with a meal.   docusate sodium 100 MG capsule Commonly known as:  COLACE Take 1 capsule (100 mg total) by mouth daily. Start taking on:  10/19/2016   losartan 100 MG tablet Commonly known as:  COZAAR Take 1 tablet (100 mg total) by mouth daily. Start taking on:  10/19/2016   oxyCODONE-acetaminophen 5-325 MG tablet Commonly known as:  ROXICET Take 1 tablet by mouth every 6 (six) hours as needed for severe pain.         Diet and Activity recommendation: See Discharge Instructions above   Consults obtained -  neurology, cardiology, vascular surgery   Major procedures and Radiology Reports - PLEASE review detailed and final reports for all details, in brief -   Status post left CEA by Dr. Myra Gianotti 2/16    Dg Chest 2 View  Result Date: 10/12/2016 CLINICAL DATA:  Initial evaluation for acute right-sided chest pain. EXAM: CHEST  2 VIEW COMPARISON:  None. FINDINGS: The cardiac and mediastinal silhouettes are within normal limits. The lungs are normally inflated. No airspace consolidation, pleural effusion, or pulmonary edema is identified. There is no pneumothorax. No acute osseous abnormality identified. IMPRESSION: No active cardiopulmonary disease. Electronically Signed   By: Rise Mu M.D.   On: 10/12/2016 21:33   Ct Head Wo Contrast  Result Date: 10/12/2016 CLINICAL DATA:  Right leg and arm weakness.  Hypertension. EXAM: CT HEAD WITHOUT CONTRAST TECHNIQUE: Contiguous axial images were obtained from the base of the skull through the vertex without intravenous contrast. COMPARISON:  None. FINDINGS: Brain: No mass lesion, intraparenchymal hemorrhage or extra-axial collection. No evidence of acute cortical infarct.  Brain parenchyma and CSF-containing spaces are normal for age. Left parafalcine arachnoid cyst. Vascular: No hyperdense vessel or unexpected calcification. Skull: Normal visualized skull base, calvarium and extracranial soft tissues. Sinuses/Orbits: No sinus fluid levels or advanced mucosal thickening. No mastoid effusion. Normal orbits. IMPRESSION: No acute intracranial abnormality. Electronically Signed   By: Deatra Robinson M.D.   On: 10/12/2016 21:12   Ct Angio Neck W Or Wo Contrast  Result Date: 10/13/2016 CLINICAL DATA:  61 year old male with left ACA infarct discovered during evaluation of confusion, right side weakness for 2 days. Initial encounter. EXAM: CT ANGIOGRAPHY NECK TECHNIQUE: Multidetector CT imaging of the neck was performed using the standard protocol during bolus administration of intravenous contrast. Multiplanar CT image reconstructions and MIPs were obtained to evaluate the vascular anatomy. Carotid stenosis measurements (when applicable) are obtained utilizing NASCET criteria, using the distal internal carotid diameter as the denominator. CONTRAST:  50 mL Isovue 370 COMPARISON:  Brain MRI and MRA head and neck 0050 hours today. FINDINGS: Skeleton: Carious posterior dentition. No acute osseous abnormality identified. Visualized paranasal sinuses and mastoids are stable and well pneumatized. Upper chest: Superior mediastinal lipomatosis. No superior mediastinal lymphadenopathy. Negative visualized lung parenchyma. Other neck: Negative thyroid, larynx, pharynx, parapharyngeal spaces, retropharyngeal space, sublingual space, submandibular glands and parotid glands. No cervical lymphadenopathy. Aortic arch: 3 vessel arch configuration. Mild distal arch calcified atherosclerosis. Right carotid system: No brachiocephalic artery or right CCA origin stenosis. Mildly tortuous proximal right CCA. Soft plaque along the anterior right CCA without stenosis (series 401, image 74). Soft  and calcified  plaque at the right carotid bifurcation with no proximal right ICA stenosis (series 404, image 93). Severely tortuous mid cervical right ICA. Otherwise negative right ICA to the level of the distal petrous segment. Left carotid system: No left CCA origin stenosis. Tortuous proximal left CCA. Minimal soft plaque proximal to the left carotid bifurcation. At the left carotid bifurcation there is low-density soft and calcified plaque affecting the left ICA origin and bulb. Subsequent stenosis is up to 50 % with respect to the distal vessel in the proximal left ICA. Mildly tortuous more distal cervical left ICA. Visible left siphon is patent with mild calcified plaque. Vertebral arteries:No proximal right subclavian artery stenosis. Right vertebral artery origin appears normal. There is a late entry of the right vertebral artery into the cervical transverse foramen. The right vertebral artery is mildly dominant. No right vertebral artery stenosis to the vertebrobasilar junction. Visible basilar artery is unremarkable. No proximal left subclavian artery stenosis despite mild plaque. The left vertebral artery origin is within normal limits. Tortuous left V1 segment. The left vertebral artery is mildly non dominant and patent to the vertebrobasilar junction without stenosis. Normal left PICA origin. IMPRESSION: 1. Soft and calcified bilateral carotid atherosclerosis in the neck with no hemodynamically significant stenosis; 50% proximal left ICA stenosis. 2. Dominant right vertebral artery with no definite vertebral artery stenosis. 3. Carious posterior dentition. Electronically Signed   By: Odessa FlemingH  Hall M.D.   On: 10/13/2016 13:01   Mr Maxine GlennMra Head Wo Contrast  Result Date: 10/13/2016 CLINICAL DATA:  RIGHT-sided weakness for 2 days. Hypertensive, RIGHT-sided chest pain and elevated D-dimer. Confusion. Assess stroke. EXAM: MRI HEAD WITHOUT CONTRAST MRA HEAD WITHOUT CONTRAST MRA NECK WITHOUT AND WITH CONTRAST TECHNIQUE:  Multiplanar, multiecho pulse sequences of the brain and surrounding structures were obtained without intravenous contrast. Angiographic images of the Circle of Willis were obtained using MRA technique without intravenous contrast. Angiographic images of the neck were obtained using MRA technique without and with intravenous contrast. Carotid stenosis measurements (when applicable) are obtained utilizing NASCET criteria, using the distal internal carotid diameter as the denominator. CONTRAST:  20mL MULTIHANCE GADOBENATE DIMEGLUMINE 529 MG/ML IV SOLN COMPARISON:  CT HEAD October 12, 2016 FINDINGS: MRI HEAD FINDINGS BRAIN: 2 x 3 cm area reduced diffusion LEFT mesial posterior frontal lobe. subcentimeter foci of reduced diffusion posterior to the dominant area, within LEFT mesial frontal and parietal lobes, with corresponding low ADC values and slight bright FLAIR T2 signal. No susceptibility artifact to suggest hemorrhage. A few scattered subcentimeter supratentorial white matter FLAIR T2 hyperintensities. The ventricles and sulci are normal for patient's age. No suspicious parenchymal signal, masses or mass effect. No abnormal extra-axial fluid collections. VASCULAR: Normal major intracranial vascular flow voids present at skull base. SKULL AND UPPER CERVICAL SPINE: No abnormal sellar expansion. No suspicious calvarial bone marrow signal. Craniocervical junction maintained. SINUSES/ORBITS: The mastoid air-cells and included paranasal sinuses are well-aerated. The included ocular globes and orbital contents are non-suspicious. OTHER: Subcentimeter lymph nodes bilateral parotid glands. MRA HEAD FINDINGS ANTERIOR CIRCULATION: Normal flow related enhancement of the included cervical, petrous, cavernous and supraclinoid internal carotid arteries. Patent anterior communicating artery. Predominately azygos ACA, occluded branch of the callosum marginal mid to distal segment. Normal flow related enhancement of the middle  cerebral arteries, including distal segments. No large vessel occlusion, high-grade stenosis, abnormal luminal irregularity, aneurysm. POSTERIOR CIRCULATION: Codominant vertebral artery's. Basilar artery is patent, with normal flow related enhancement of the main branch vessels. Normal flow related enhancement  of the posterior cerebral arteries. No large vessel occlusion, high-grade stenosis, abnormal luminal irregularity, aneurysm. ANATOMIC VARIANTS: None. MRA NECK FINDINGS ANTERIOR CIRCULATION: The common carotid arteries are widely patent bilaterally. The carotid bifurcation are patent bilaterally. Within 5 mm of the bilateral internal carotid artery origins are 2 mm segments of 50% stenosis by NASCET criteria. No flow limiting stenosis of the cervical internal carotid arteries. POSTERIOR CIRCULATION: Bilateral vertebral arteries are patent to the vertebrobasilar junction. No evidence for atherosclerosis or flow limiting stenosis. Source images and MIP image were reviewed. IMPRESSION: MRI HEAD: Acute nonhemorrhagic LEFT frontoparietal/ACA territory infarcts. Mild chronic small vessel ischemic disease. MRA HEAD: Occluded mid to distal callosomarginal branch of the anterior cerebral artery. Predominately azygos ACA. MRA NECK: 50% stenosis bilateral proximal internal carotid artery origins. Electronically Signed   By: Awilda Metro M.D.   On: 10/13/2016 02:35   Mr Angiogram Neck W Or Wo Contrast  Result Date: 10/13/2016 CLINICAL DATA:  RIGHT-sided weakness for 2 days. Hypertensive, RIGHT-sided chest pain and elevated D-dimer. Confusion. Assess stroke. EXAM: MRI HEAD WITHOUT CONTRAST MRA HEAD WITHOUT CONTRAST MRA NECK WITHOUT AND WITH CONTRAST TECHNIQUE: Multiplanar, multiecho pulse sequences of the brain and surrounding structures were obtained without intravenous contrast. Angiographic images of the Circle of Willis were obtained using MRA technique without intravenous contrast. Angiographic images of the  neck were obtained using MRA technique without and with intravenous contrast. Carotid stenosis measurements (when applicable) are obtained utilizing NASCET criteria, using the distal internal carotid diameter as the denominator. CONTRAST:  20mL MULTIHANCE GADOBENATE DIMEGLUMINE 529 MG/ML IV SOLN COMPARISON:  CT HEAD October 12, 2016 FINDINGS: MRI HEAD FINDINGS BRAIN: 2 x 3 cm area reduced diffusion LEFT mesial posterior frontal lobe. subcentimeter foci of reduced diffusion posterior to the dominant area, within LEFT mesial frontal and parietal lobes, with corresponding low ADC values and slight bright FLAIR T2 signal. No susceptibility artifact to suggest hemorrhage. A few scattered subcentimeter supratentorial white matter FLAIR T2 hyperintensities. The ventricles and sulci are normal for patient's age. No suspicious parenchymal signal, masses or mass effect. No abnormal extra-axial fluid collections. VASCULAR: Normal major intracranial vascular flow voids present at skull base. SKULL AND UPPER CERVICAL SPINE: No abnormal sellar expansion. No suspicious calvarial bone marrow signal. Craniocervical junction maintained. SINUSES/ORBITS: The mastoid air-cells and included paranasal sinuses are well-aerated. The included ocular globes and orbital contents are non-suspicious. OTHER: Subcentimeter lymph nodes bilateral parotid glands. MRA HEAD FINDINGS ANTERIOR CIRCULATION: Normal flow related enhancement of the included cervical, petrous, cavernous and supraclinoid internal carotid arteries. Patent anterior communicating artery. Predominately azygos ACA, occluded branch of the callosum marginal mid to distal segment. Normal flow related enhancement of the middle cerebral arteries, including distal segments. No large vessel occlusion, high-grade stenosis, abnormal luminal irregularity, aneurysm. POSTERIOR CIRCULATION: Codominant vertebral artery's. Basilar artery is patent, with normal flow related enhancement of the  main branch vessels. Normal flow related enhancement of the posterior cerebral arteries. No large vessel occlusion, high-grade stenosis, abnormal luminal irregularity, aneurysm. ANATOMIC VARIANTS: None. MRA NECK FINDINGS ANTERIOR CIRCULATION: The common carotid arteries are widely patent bilaterally. The carotid bifurcation are patent bilaterally. Within 5 mm of the bilateral internal carotid artery origins are 2 mm segments of 50% stenosis by NASCET criteria. No flow limiting stenosis of the cervical internal carotid arteries. POSTERIOR CIRCULATION: Bilateral vertebral arteries are patent to the vertebrobasilar junction. No evidence for atherosclerosis or flow limiting stenosis. Source images and MIP image were reviewed. IMPRESSION: MRI HEAD: Acute nonhemorrhagic LEFT frontoparietal/ACA territory infarcts.  Mild chronic small vessel ischemic disease. MRA HEAD: Occluded mid to distal callosomarginal branch of the anterior cerebral artery. Predominately azygos ACA. MRA NECK: 50% stenosis bilateral proximal internal carotid artery origins. Electronically Signed   By: Awilda Metro M.D.   On: 10/13/2016 02:35   Mr Brain Wo Contrast  Result Date: 10/13/2016 CLINICAL DATA:  RIGHT-sided weakness for 2 days. Hypertensive, RIGHT-sided chest pain and elevated D-dimer. Confusion. Assess stroke. EXAM: MRI HEAD WITHOUT CONTRAST MRA HEAD WITHOUT CONTRAST MRA NECK WITHOUT AND WITH CONTRAST TECHNIQUE: Multiplanar, multiecho pulse sequences of the brain and surrounding structures were obtained without intravenous contrast. Angiographic images of the Circle of Willis were obtained using MRA technique without intravenous contrast. Angiographic images of the neck were obtained using MRA technique without and with intravenous contrast. Carotid stenosis measurements (when applicable) are obtained utilizing NASCET criteria, using the distal internal carotid diameter as the denominator. CONTRAST:  20mL MULTIHANCE GADOBENATE  DIMEGLUMINE 529 MG/ML IV SOLN COMPARISON:  CT HEAD October 12, 2016 FINDINGS: MRI HEAD FINDINGS BRAIN: 2 x 3 cm area reduced diffusion LEFT mesial posterior frontal lobe. subcentimeter foci of reduced diffusion posterior to the dominant area, within LEFT mesial frontal and parietal lobes, with corresponding low ADC values and slight bright FLAIR T2 signal. No susceptibility artifact to suggest hemorrhage. A few scattered subcentimeter supratentorial white matter FLAIR T2 hyperintensities. The ventricles and sulci are normal for patient's age. No suspicious parenchymal signal, masses or mass effect. No abnormal extra-axial fluid collections. VASCULAR: Normal major intracranial vascular flow voids present at skull base. SKULL AND UPPER CERVICAL SPINE: No abnormal sellar expansion. No suspicious calvarial bone marrow signal. Craniocervical junction maintained. SINUSES/ORBITS: The mastoid air-cells and included paranasal sinuses are well-aerated. The included ocular globes and orbital contents are non-suspicious. OTHER: Subcentimeter lymph nodes bilateral parotid glands. MRA HEAD FINDINGS ANTERIOR CIRCULATION: Normal flow related enhancement of the included cervical, petrous, cavernous and supraclinoid internal carotid arteries. Patent anterior communicating artery. Predominately azygos ACA, occluded branch of the callosum marginal mid to distal segment. Normal flow related enhancement of the middle cerebral arteries, including distal segments. No large vessel occlusion, high-grade stenosis, abnormal luminal irregularity, aneurysm. POSTERIOR CIRCULATION: Codominant vertebral artery's. Basilar artery is patent, with normal flow related enhancement of the main branch vessels. Normal flow related enhancement of the posterior cerebral arteries. No large vessel occlusion, high-grade stenosis, abnormal luminal irregularity, aneurysm. ANATOMIC VARIANTS: None. MRA NECK FINDINGS ANTERIOR CIRCULATION: The common carotid arteries  are widely patent bilaterally. The carotid bifurcation are patent bilaterally. Within 5 mm of the bilateral internal carotid artery origins are 2 mm segments of 50% stenosis by NASCET criteria. No flow limiting stenosis of the cervical internal carotid arteries. POSTERIOR CIRCULATION: Bilateral vertebral arteries are patent to the vertebrobasilar junction. No evidence for atherosclerosis or flow limiting stenosis. Source images and MIP image were reviewed. IMPRESSION: MRI HEAD: Acute nonhemorrhagic LEFT frontoparietal/ACA territory infarcts. Mild chronic small vessel ischemic disease. MRA HEAD: Occluded mid to distal callosomarginal branch of the anterior cerebral artery. Predominately azygos ACA. MRA NECK: 50% stenosis bilateral proximal internal carotid artery origins. Electronically Signed   By: Awilda Metro M.D.   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 Cardiac clearance required prior to possible left carotid endarterectomy. Evidence of systolic LV dysfunction by echocardiography with reduced ejection fraction. Hypertension and hyperlipidemia. EXAM: MYOCARDIAL IMAGING WITH SPECT (REST AND PHARMACOLOGIC-STRESS) GATED LEFT VENTRICULAR  WALL MOTION STUDY LEFT VENTRICULAR EJECTION FRACTION TECHNIQUE: Standard myocardial SPECT imaging was performed after resting intravenous injection of 10 mCi Tc-58m tetrofosmin. Subsequently, intravenous infusion of Lexiscan was performed under the supervision of the Cardiology staff. At peak effect of the drug, 30 mCi Tc-33m tetrofosmin was injected intravenously and standard myocardial SPECT imaging was performed. Quantitative gated imaging was also performed to evaluate left ventricular wall motion, and estimate left ventricular ejection fraction. COMPARISON:  None. FINDINGS: Perfusion: There is mild apical attenuation present on both stress  and rest imaging. No evidence of inducible myocardial ischemia. Wall Motion: The left ventricle demonstrates global hypokinesis and mild cavity dilatation. Left Ventricular Ejection Fraction: 34 % End diastolic volume 170 ml End systolic volume 113 ml IMPRESSION: 1. Mild attenuation of the left ventricular apex without evidence of inducible myocardial ischemia. 2. Global left ventricular hypokinesis with LV cavity dilatation. 3. Left ventricular ejection fraction 34% 4. Non invasive risk stratification*: Intermediate *2012 Appropriate Use Criteria for Coronary Revascularization Focused Update: J Am Coll Cardiol. 2012;59(9):857-881. http://content.dementiazones.com.aspx?articleid=[email or web address] Electronically Signed   By: Irish Lack M.D.   On: 10/15/2016 15:01    Micro Results     Recent Results (from the past 240 hour(s))  Surgical pcr screen     Status: None   Collection Time: 10/16/16  5:45 AM  Result Value Ref Range Status   [name], PCR NEGATIVE NEGATIVE Final   Staphylococcus aureus NEGATIVE NEGATIVE Final    Comment:        The Xpert SA Assay (FDA approved for NASAL specimens in patients over 15 years of age), is one component of a comprehensive surveillance program.  Test performance has been validated by [facility] for patients greater than or equal to 21 year old. It is not intended to diagnose infection nor to guide or monitor treatment.        Today   Subjective:   Scot Jun today has no further headache,no chest or abdominal pain,no new weakness tingling or numbness, feels much better wants to go home today.   Objective:   Blood pressure (!) 153/81, pulse 67, temperature 98 F (36.7 C), temperature source Oral, resp. rate (!) 21, height 6' (1.829 m), weight 120.7 kg (266 lb), SpO2 97 %.   Intake/Output Summary (Last 24 hours) at 10/18/16 1224 Last data filed at 10/18/16 0900  Gross per 24 hour  Intake          8096.25 ml  Output             1550 ml  Net           6546.25 ml    Exam Awake Alert, Oriented X 3, No new F.N deficits, Normal affect [place].AT,PERRAL Supple Neck,No JVD,Left neck surgical wound, clean, no oozing or discharge Symmetrical Chest wall movement, Good air movement bilaterally, CTAB RRR,No Gallops,Rubs or new Murmurs, No Parasternal Heave +ve B.Sounds, Abd Soft, No tenderness,No rebound - guarding or rigidity. No Cyanosis, Clubbing or edema,No deficits  Data Review   CBC w Diff: Lab Results  Component Value Date   WBC 8.4 10/17/2016   HGB 13.6 10/17/2016   HCT 41.1 10/17/2016   PLT 140 (L) 10/17/2016   LYMPHOPCT 26 10/13/2016   MONOPCT 5 10/13/2016   EOSPCT 1 10/13/2016   BASOPCT 0 10/13/2016    CMP: Lab Results  Component Value Date   NA 139 10/17/2016   K 4.4 10/17/2016   CL 104 10/17/2016   CO2 22 10/17/2016   BUN 10 10/17/2016  CREATININE 0.88 10/17/2016   CREATININE 0.83 10/29/2015   PROT 6.0 (L) 10/12/2016   ALBUMIN 3.5 10/12/2016   BILITOT 0.9 10/12/2016   ALKPHOS 34 (L) 10/12/2016   AST 34 10/12/2016   ALT 18 10/12/2016  .   Total Time in preparing paper work, data evaluation and todays exam - 35 minutes  Nivedita Mirabella M.D on 10/18/2016 at 12:24 PM  [facility]   Office  [phone]

## 2016-10-18 NOTE — Discharge Instructions (Signed)
Follow with Primary MD in 7 days   Get CBC, CMP, by Primary MD next visit.    Activity: As tolerated with Full fall precautions use walker/cane & assistance as needed   Disposition Home    Diet: Heart Healthy    For Heart failure patients - Check your Weight same time everyday, if you gain over 2 pounds, or you develop in leg swelling, experience more shortness of breath or chest pain, call your Primary MD immediately. Follow Cardiac Low Salt Diet and 1.5 lit/day fluid restriction.   On your next visit with your primary care physician please Get Medicines reviewed and adjusted.   Please request your Prim.MD to go over all Hospital Tests and Procedure/Radiological results at the follow up, please get all Hospital records sent to your Prim MD by signing hospital release before you go home.   If you experience worsening of your admission symptoms, develop shortness of breath, life threatening emergency, suicidal or homicidal thoughts you must seek medical attention immediately by calling 911 or calling your MD immediately  if symptoms less severe.  You Must read complete instructions/literature along with all the possible adverse reactions/side effects for all the Medicines you take and that have been prescribed to you. Take any new Medicines after you have completely understood and accpet all the possible adverse reactions/side effects.   Do not drive, operating heavy machinery, perform activities at heights, swimming or participation in water activities or provide baby sitting services if your were admitted for syncope or siezures until you have seen by Primary MD or a Neurologist and advised to do so again.  Do not drive when taking Pain medications.    Do not take more than prescribed Pain, Sleep and Anxiety Medications  Special Instructions: If you have smoked or chewed Tobacco  in the last 2 yrs please stop smoking, stop any regular Alcohol  and or any Recreational drug  use.  Wear Seat belts while driving.   Please note  You were cared for by a hospitalist during your hospital stay. If you have any questions about your discharge medications or the care you received while you were in the hospital after you are discharged, you can call the unit and asked to speak with the hospitalist on call if the hospitalist that took care of you is not available. Once you are discharged, your primary care physician will handle any further medical issues. Please note that NO REFILLS for any discharge medications will be authorized once you are discharged, as it is imperative that you return to your primary care physician (or establish a relationship with a primary care physician if you do not have one) for your aftercare needs so that they can reassess your need for medications and monitor your lab values.

## 2016-10-18 NOTE — Progress Notes (Signed)
Pt having some vent bigeminy, Dr Randol KernElgergawy notified. Marisue Ivanobyn Uriyah Raska RN

## 2016-10-18 NOTE — Progress Notes (Signed)
Subjective:  He is feeling better today.  He had fullness and headache are better today.  He is planning to go home today.  No shortness of breath or chest pain.  Blood pressure is still elevated.  Objective:  Vital Signs in the last 24 hours: BP (!) 170/87 (BP Location: Left Arm)   Pulse 80   Temp 98.5 F (36.9 C) (Oral)   Resp 19   Ht 6' (1.829 m)   Wt 120.7 kg (266 lb)   SpO2 97%   BMI 36.08 kg/m   Physical Exam: Pleasant obese male in no acute distress Neck: Previous carotid endarterectomy site mild swelling Lungs:  Clear  Cardiac:  Regular rhythm, normal S1 and S2, no S3 Abdomen:  Obese, Soft, nontender, no masses Extremities:  No edema present  Intake/Output from previous day: 02/17 0701 - 02/18 0700 In: 8096.3 [P.O.:1320; I.V.:6776.3] Out: 1750 [Urine:1750] Weight Filed Weights   10/12/16 1937 10/13/16 0236  Weight: 120.2 kg (265 lb) 120.7 kg (266 lb)    Lab Results: Basic Metabolic Panel:  Recent Labs  40/98/1102/16/18 2314 10/17/16 0314  NA 139 139  K 4.3 4.4  CL 104 104  CO2 24 22  GLUCOSE 168* 151*  BUN 11 10  CREATININE 0.97 0.88    CBC:  Recent Labs  10/16/16 2314 10/17/16 0314  WBC 7.8 8.4  HGB 14.2 13.6  HCT 42.0 41.1  MCV 91.1 91.7  PLT 146* 140*   BNP    Component Value Date/Time   BNP 27.0 10/12/2016 1949   Telemetry: Personally reviewed by me Sinus rhythm with PVCs some in bigeminy  Assessment/Plan:  1.  Hypertensive heart disease blood pressure still elevated but with previous acute stroke will have gradual lowering with permissive hypertension-I would discharge on losartan 100 mg daily and blood pressure will need to gradually be brought down following his acute stroke. 2.  Chronic systolic heart failure likely due to untreated hypertension 3.  Previous acute left brain CVA 4.  PVCs  Recommendations:    Cardiology follow-up in 2-4 weeks with Dr. Katrinka BlazingSmith.     Darden PalmerW. Spencer Zeya Balles, Jr.  MD [facility]  10/18/2016, 10:23  AM

## 2016-10-19 ENCOUNTER — Encounter (HOSPITAL_COMMUNITY): Payer: Self-pay | Admitting: Surgery

## 2016-10-20 ENCOUNTER — Encounter (HOSPITAL_COMMUNITY): Payer: Self-pay | Admitting: *Deleted

## 2016-10-20 ENCOUNTER — Emergency Department (HOSPITAL_COMMUNITY)
Admission: EM | Admit: 2016-10-20 | Discharge: 2016-10-20 | Disposition: A | Discharge status: Home / Self Care | Payer: 59 | Attending: Physician Assistant | Admitting: Physician Assistant

## 2016-10-20 ENCOUNTER — Encounter: Payer: Self-pay | Admitting: Family

## 2016-10-20 ENCOUNTER — Ambulatory Visit (INDEPENDENT_AMBULATORY_CARE_PROVIDER_SITE_OTHER): Payer: Self-pay | Admitting: Family

## 2016-10-20 VITALS — BP 138/86 | HR 73 | Temp 99.1°F | Resp 18 | Ht 72.0 in | Wt 259.0 lb

## 2016-10-20 DIAGNOSIS — Z9889 Other specified postprocedural states: Secondary | ICD-10-CM

## 2016-10-20 DIAGNOSIS — Z8673 Personal history of transient ischemic attack (TIA), and cerebral infarction without residual deficits: Secondary | ICD-10-CM

## 2016-10-20 DIAGNOSIS — Z8679 Personal history of other diseases of the circulatory system: Secondary | ICD-10-CM

## 2016-10-20 DIAGNOSIS — Z7982 Long term (current) use of aspirin: Secondary | ICD-10-CM | POA: Diagnosis not present

## 2016-10-20 DIAGNOSIS — R001 Bradycardia, unspecified: Secondary | ICD-10-CM

## 2016-10-20 DIAGNOSIS — I10 Essential (primary) hypertension: Secondary | ICD-10-CM | POA: Insufficient documentation

## 2016-10-20 DIAGNOSIS — I6522 Occlusion and stenosis of left carotid artery: Secondary | ICD-10-CM

## 2016-10-20 NOTE — ED Notes (Signed)
EDP at bedside  

## 2016-10-20 NOTE — ED Triage Notes (Signed)
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 Was dishcarged home on Sunday states he his neck was draining, yest noticed a little more drainage , today went to MD states they were mashing and draining his neck wound and he felt vey hot and had a near syncopal episode. Presently alert oriented, states he feels better now.

## 2016-10-20 NOTE — ED Provider Notes (Signed)
[facility]-EMERGENCY DEPT Provider Note   CSN: [id number] Arrival date & time: 10/20/16  1424     History   Chief Complaint Chief Complaint  Patient presents with  . Bradycardia    HPI Jason Melendez is a 61 y.o. male. Patient is a 61 year old male who presents from the vascular surgery clinic due to an episode of bradycardia. He was being seen at the vascular surgery clinic due to ongoing drainage from his surgical site. He had left carotid endarterectomy on Friday. He has been having mild serous drainage from the site. Per the patient, the nurse practitioner was massaging the area around his left carotid when he became bradycardic and hypotensive. He experienced lightheadedness and dizziness. When she stopped massaging the area around his left carotid sinus, his bradycardia resolved. He continued to have some lightheadedness which resolved when he got into the ambulance. Here, patient is a symptomatically. He denies any chest pain or shortness of breath currently. He denies any palpitations.  HPI  Past Medical History:  Diagnosis Date  . HTN (hypertension)     Patient Active Problem List   Diagnosis Date Noted  . Stenosis of left carotid artery   . Cardiomyopathy (HCC)   . CVA (cerebral vascular accident) (HCC) 10/13/2016  . Hypertensive heart disease without CHF 10/13/2016  . HLD (hyperlipidemia) 10/13/2016  . Obesity (BMI 30.0-34.9)     Past Surgical History:  Procedure Laterality Date  . ENDARTERECTOMY Left 10/16/2016   Procedure: ENDARTERECTOMY CAROTID;  Surgeon: Nada LibmanVance W Brabham, MD;  [facility];  Service: Vascular;  Laterality: Left;  . HERNIA REPAIR    . PATCH ANGIOPLASTY Left 10/16/2016   Procedure: PATCH ANGIOPLASTY USING Livia SnellenXENOSURE BIOLOGIC PATCH;  Surgeon: Nada LibmanVance W Brabham, MD;  [facility];  Service: Vascular;  Laterality: Left;       Home Medications    Prior to Admission medications   Medication Sig Start Date End Date Taking? Authorizing Provider    aspirin 325 MG tablet Take 1 tablet (325 mg total) by mouth daily. 10/19/16   Leana Roeawood S Elgergawy, MD  atorvastatin (LIPITOR) 40 MG tablet Take 1 tablet (40 mg total) by mouth daily at 6 PM. 10/18/16   Starleen Armsawood S Elgergawy, MD  carvedilol (COREG) 6.25 MG tablet Take 1 tablet (6.25 mg total) by mouth 2 (two) times daily with a meal. 10/18/16   Starleen Armsawood S Elgergawy, MD  docusate sodium (COLACE) 100 MG capsule Take 1 capsule (100 mg total) by mouth daily. 10/19/16   Leana Roeawood S Elgergawy, MD  losartan (COZAAR) 100 MG tablet Take 1 tablet (100 mg total) by mouth daily. 10/19/16   Leana Roeawood S Elgergawy, MD  oxyCODONE-acetaminophen (ROXICET) 5-325 MG tablet Take 1 tablet by mouth every 6 (six) hours as needed for severe pain. 10/17/16   Dara LordsSamantha J Rhyne, PA-C    Family History Family History  Problem Relation Age of Onset  . Diabetes Mother   . Heart disease Father 3960    CABG  . Diabetes Father   . Kidney disease Father   . Kidney failure Father   . Hypertension Sister     Social History Social History  Substance Use Topics  . Smoking status: Never Smoker  . Smokeless tobacco: Never Used  . Alcohol use No     Allergies   Patient has no known allergies.   Review of Systems Review of Systems  Constitutional: Negative for chills and fever.  HENT: Negative for ear pain and sore throat.   Eyes: Negative  for pain and visual disturbance.  Respiratory: Positive for shortness of breath. Negative for cough.   Cardiovascular: Negative for chest pain and palpitations.  Gastrointestinal: Negative for abdominal pain and vomiting.  Genitourinary: Negative for dysuria and hematuria.  Musculoskeletal: Negative for arthralgias and back pain.  Skin: Negative for color change and rash.  Neurological: Positive for dizziness and weakness. Negative for seizures and syncope.  All other systems reviewed and are negative.    Physical Exam Updated Vital Signs BP 174/90   Pulse 77   Temp 98.3 F (36.8 C) (Oral)    Resp 18   SpO2 95%   Physical Exam  Constitutional: He is oriented to person, place, and time. He appears well-developed and well-nourished.  HENT:  Head: Normocephalic and atraumatic.  Eyes: Conjunctivae and EOM are normal. Pupils are equal, round, and reactive to light.  Neck: Neck supple.  Incision site to L neck appears to be healing well. No drainage at this time.  Cardiovascular: Normal rate, regular rhythm and normal heart sounds.   No murmur heard. Pulmonary/Chest: Effort normal and breath sounds normal. No respiratory distress. He has no wheezes. He has no rales.  Abdominal: Soft. There is no tenderness.  Musculoskeletal: He exhibits no edema, tenderness or deformity.  Neurological: He is alert and oriented to person, place, and time. No cranial nerve deficit.  Strength 5/5 in all extremities. Sensation to light touch intact throughout.  Skin: Skin is warm and dry.  Psychiatric: He has a normal mood and affect.  Nursing note and vitals reviewed.    ED Treatments / Results  Labs (all labs ordered are listed, but only abnormal results are displayed) Labs Reviewed - No data to display  EKG  EKG Interpretation None       Radiology No results found.  Procedures Procedures (including critical care time)  Medications Ordered in ED Medications - No data to display   Initial Impression / Assessment and Plan / ED Course  I have reviewed the triage vital signs and the nursing notes.  Pertinent labs & imaging results that were available during my care of the patient were reviewed by me and considered in my medical decision making (see chart for details).    Patient with history as above presents from vascular surgical clinic after an episode of bradycardia, likely secondary to carotid sinus massage. Here, patient's symptoms have resolved. Orthostatic vital signs are reassuring. Patient feels back to baseline. He has no active drainage from his surgical site. Patient  is stable for discharge. Discharged in stable condition with return precautions discussed.  Final Clinical Impressions(s) / ED Diagnoses   Final diagnoses:  Bradycardia    New Prescriptions New Prescriptions   No medications on file     Lennette Bihari, MD 10/20/16 1807    Courteney Lyn Corlis Leak, MD 10/22/16 1253

## 2016-10-20 NOTE — Progress Notes (Signed)
Postoperative Visit   History of Present Illness  Jason Melendez is a 61 y.o. male who is s/p left carotid endarterectomy with bovine pericardial patch angioplasty on 10-16-16 by Dr. Myra Melendez for symptomatic left carotid stenosis. The patient presented with a left brain stroke.  His symptoms have resolved. He has not had any subsequent stroke or TIA.   He returns today with c/o oozing dark red blood from neck incision since the evening he was discharged from the hospital 4 days ago.  He denies fever or chills.  He denies any history of syncope.  He states that he has a history of an inner ear problem.  TEE on 10-13-16:  Left ventricle: The cavity size was mildly dilated. There was   mild focal basal hypertrophy of the septum. Systolic function was   mildly to moderately reduced. The estimated ejection fraction was   in the range of 40% to 45%. Diffuse hypokinesis. There was an   increased relative contribution of atrial contraction to   ventricular filling. Doppler parameters are consistent with   abnormal left ventricular relaxation (grade 1 diastolic   dysfunction). - Aortic valve: Trileaflet; mildly thickened, mildly calcified   leaflets. - Left atrium: The atrium was mildly dilated. - Pulmonary arteries: Systolic pressure could not be accurately   estimated. Dr. Armanda Melendez was interpreting cardiologist.      For VQI Use Only  PRE-ADM LIVING: Home  AMB STATUS: Ambulatory    Past Medical History:  Diagnosis Date  . HTN (hypertension)     Social History   Social History  . Marital status: Married    Spouse name: N/A  . Number of children: N/A  . Years of education: N/A   Occupational History  . Not on file.   Social History Main Topics  . Smoking status: Never Smoker  . Smokeless tobacco: Never Used  . Alcohol use No  . Drug use: No  . Sexual activity: Not Currently   Other Topics Concern  . Not on file   Social History Narrative   Marital  status: married      Children: 2 daughters      Lives:  With wife      Employment: printing work      Tobacco: none      Alcohol: one beer every three months      Drugs:  None      Exercise:  No formal exercise; walks up three flights of stairs several times per day at work.   Family History  Problem Relation Age of Onset  . Diabetes Mother   . Heart disease Father 68    CABG  . Diabetes Father   . Kidney disease Father   . Kidney failure Father   . Hypertension Sister     No Known Allergies   Current Outpatient Prescriptions on File Prior to Visit  Medication Sig Dispense Refill  . aspirin 325 MG tablet Take 1 tablet (325 mg total) by mouth daily. 60 tablet 0  . atorvastatin (LIPITOR) 40 MG tablet Take 1 tablet (40 mg total) by mouth daily at 6 PM. 30 tablet 1  . carvedilol (COREG) 6.25 MG tablet Take 1 tablet (6.25 mg total) by mouth 2 (two) times daily with a meal. 60 tablet 0  . docusate sodium (COLACE) 100 MG capsule Take 1 capsule (100 mg total) by mouth daily. 10 capsule 0  . losartan (COZAAR) 100 MG tablet Take 1 tablet (100 mg total) by  mouth daily. 30 tablet 0  . oxyCODONE-acetaminophen (ROXICET) 5-325 MG tablet Take 1 tablet by mouth every 6 (six) hours as needed for severe pain. 8 tablet 0   No current facility-administered medications on file prior to visit.     Physical Examination  Vitals:   10/20/16 1220 10/20/16 1222  BP: 130/79 138/86  Pulse: 74 73  Resp:  18  Temp:  99.1 F (37.3 C)  TempSrc:  Oral  SpO2:  97%  Weight:  259 lb (117.5 kg)  Height:  6' (1.829 m)   Body mass index is 35.13 kg/m.  Left side neck incision with scant amount dried blood adhered to dermabond. Approximately 4 cm x 2.5 cm semisoft mass at proximal end of incision that is draining moderate amount watery brown tinged drainage, more drainage when lightly expressed.  Neuro: CN 2-12 are intact, Motor strength is 5/5 bilaterally, sensation is grossly intact.  Medical Decision  Making  Jason Melendez is a 61 y.o. male who presents s/p left CEA on 10-16-16. He has a draining seroma at proximal end of left side neck incision.  He states he has not been drinking much fluids, however, skin is not tenting.   Pt became near syncopal when lying on exam table, placed flat, still feeling almost syncopal, then raised his legs above his head and he felt much better. When symptomatic, heart rate dropped to 45, blood pressure was 88/58, was clammy, SaO2 95% on RA. When he felt better blood pressure was 112/60, HR 65, SaO2. After a few minutes of feet higher than his head, then placed him with head of exam table at 30 degrees elevation and supine. He remained asymptomatic. Later sat him up and checked his blood pressure, he again became light headed, hypotensive, bradycardic, and clammy.    Dr. Arbie Melendez spoke with pt and wife and examined pt.  Since pt is unable to sit up without becoming almost syncopal, will request EMS to transport him to [facility] for evaluation of symptomatic hypotension and bradycardia.  2 week follow up with Dr. Myra GianottiBrabham is due about March 5, working on finding an opening in Dr. Estanislado Melendez's clinic schedule.   The patient's neck incision is healing with no stroke symptoms.  The patient is currently on an antiplatelet: ASA. The patient is currently on a statin: atorvastatin.  Marland Kitchen.  NICKEL, Carma LairSUZANNE L, RN, MSN, FNP-C [facility] Office: [phone]  10/20/2016, 12:28 PM  Clinic MD: Early

## 2016-10-20 NOTE — Patient Instructions (Signed)
Carotid Endarterectomy, Care After Refer to this sheet in the next few weeks. These instructions provide you with information on caring for yourself after your procedure. Your health care provider may also give you more specific instructions. Your treatment has been planned according to current medical practices, but problems sometimes occur. Call your health care provider if you have any problems or questions after your procedure.  WHAT TO EXPECT AFTER THE PROCEDURE You may have some pain or an ache in your neck for up to 2 weeks. This is normal. Recovery time varies depending on your age, condition, general health, and other factors. You will likely be able to return to a normal lifestyle within a few weeks.  HOME CARE INSTRUCTIONS   Take showers if your health care provider approves. Do not take baths, swim, or use a hot tub until your health care provider approves.   Take medicines only as directed by your health care provider. If a blood thinner (anticoagulant) is prescribed after surgery, take this medicine exactly as directed.  Change bandages (dressings) as directed by your health care provider.   Avoid heavy lifting or strenuous activity until your health care provider says it is okay. Resume your normal activities as directed.   Stop smoking if you smoke. This is a risk factor for poor wound healing.   Stop taking the pill (oral contraceptives) unless your health care provider recommends otherwise.   Maintain good control of your blood pressure.   Exercise regularly or as instructed by your health care provider.   Eat a heart-healthy diet. Talk to your health care provider about how to lower blood lipids (cholesterol and triglycerides).   Keep all follow-up visits as directed by your health care provider. Make an appointment for the removal of stitches (sutures) or staples. SEEK MEDICAL CARE IF:   You have increased bleeding from the incision site.   You notice  redness, swelling, or increasing pain at the incision site.   You notice swelling in your neck or have difficulty breathing or talking.   You notice a bad smell or pus coming from the incision site or dressing.   You have a fever.  You develop a rash.   You develop any reaction or side effects to medicine given.  SEEK IMMEDIATE MEDICAL CARE IF:   Your initial symptoms are getting worse rather than better.   You develop any abnormal bruising or bleeding.   You have difficulty breathing.   You develop chest pain, shortness of breath, or pain or swelling in your legs.   You have a return of symptoms or problems that caused you to have this surgery.   You develop a temporary loss of vision.   You develop temporary numbness on one side.   You develop a temporary inability to speak (aphasia).   You develop temporary weakness.  MAKE SURE YOU:   Understand these instructions.  Will watch your condition.  Will get help right away if you are not doing well or get worse. This information is not intended to replace advice given to you by your health care provider. Make sure you discuss any questions you have with your health care provider. Document Released: 03/06/2005 Document Revised: 09/07/2014 Document Reviewed: 01/18/2013 Elsevier Interactive Patient Education  2017 [employer]

## 2016-10-26 ENCOUNTER — Other Ambulatory Visit: Payer: Self-pay

## 2016-10-26 ENCOUNTER — Telehealth: Payer: Self-pay | Admitting: Neurology

## 2016-10-26 DIAGNOSIS — I639 Cerebral infarction, unspecified: Secondary | ICD-10-CM

## 2016-10-26 NOTE — Telephone Encounter (Signed)
Orders put in for PT and OT to neuro rehab.

## 2016-10-26 NOTE — Telephone Encounter (Signed)
Pt's wife called said his rt arm is still very weak, she is wanting to know if he could get PT order.

## 2016-10-27 ENCOUNTER — Ambulatory Visit (INDEPENDENT_AMBULATORY_CARE_PROVIDER_SITE_OTHER): Payer: 59 | Admitting: Primary Care

## 2016-10-27 ENCOUNTER — Encounter: Payer: Self-pay | Admitting: Surgery

## 2016-10-27 ENCOUNTER — Encounter: Payer: Self-pay | Admitting: Primary Care

## 2016-10-27 VITALS — BP 144/86 | HR 71 | Temp 98.2°F | Ht 72.0 in | Wt 254.0 lb

## 2016-10-27 DIAGNOSIS — I6300 Cerebral infarction due to thrombosis of unspecified precerebral artery: Secondary | ICD-10-CM

## 2016-10-27 DIAGNOSIS — D696 Thrombocytopenia, unspecified: Secondary | ICD-10-CM

## 2016-10-27 DIAGNOSIS — F411 Generalized anxiety disorder: Secondary | ICD-10-CM

## 2016-10-27 DIAGNOSIS — R7303 Prediabetes: Secondary | ICD-10-CM | POA: Diagnosis not present

## 2016-10-27 DIAGNOSIS — I10 Essential (primary) hypertension: Secondary | ICD-10-CM

## 2016-10-27 DIAGNOSIS — I6522 Occlusion and stenosis of left carotid artery: Secondary | ICD-10-CM

## 2016-10-27 DIAGNOSIS — I119 Hypertensive heart disease without heart failure: Secondary | ICD-10-CM | POA: Diagnosis not present

## 2016-10-27 DIAGNOSIS — E785 Hyperlipidemia, unspecified: Secondary | ICD-10-CM

## 2016-10-27 DIAGNOSIS — E119 Type 2 diabetes mellitus without complications: Secondary | ICD-10-CM | POA: Insufficient documentation

## 2016-10-27 MED ORDER — ESCITALOPRAM OXALATE 10 MG PO TABS: 10.0000 mg | ORAL_TABLET | Freq: Every day | ORAL | 1 refills | Status: DC

## 2016-10-27 NOTE — Assessment & Plan Note (Addendum)
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 Exam today stable, no new deficits. Continue statin and BP control. Work with PT/OT for rehabilitation.

## 2016-10-27 NOTE — Assessment & Plan Note (Addendum)
Managed on Beta blocker and ARB. Recent echo reviewed. Continue BP control. Home readings stable, monitor diastolic.  Suspect cough due to ARB. If cough bothersome in 2 weeks, consider switching medication, this may dissipate before then. Cardiology is considering Spironolactone, will defer final decision to cards as he has a follow up appointment scheduled in 2 weeks.

## 2016-10-27 NOTE — Assessment & Plan Note (Signed)
Long history of, once managed on oral medication. GAD 7 score of 15 today. Need to gain control of anxiety to help overall wellness and healing. Discussed numerous options for treatment and he elects for both medication and CBT.  Referral placed for therapy. Rx for Lexapro sent to pharmacy. Patient is to take 1/2 tablet daily for 8 days, then advance to 1 full tablet thereafter. We discussed possible side effects of headache, GI upset, drowsiness, and SI/HI. If thoughts of SI/HI develop, we discussed to present to the emergency immediately. Patient verbalized understanding.   Follow up in 6 weeks for re-evaluation.

## 2016-10-27 NOTE — Assessment & Plan Note (Signed)
A1C from recent hospitalization of 6.4. Work on diet and exercise. Recheck A1C in 3 months.

## 2016-10-27 NOTE — Patient Instructions (Addendum)
Start escitalopram (Lexapro) 10 mg tablet for anxiety. Start by taking 1/2 tablet daily for 8 days, then advance to 1 full tablet thereafter.   You will be contacted regarding your referral to therapy.  Please let us know if you have not heard back within one week.   Please notify myself or Dr. Katrinka BlazingSmith if you continue to notice the cough as it is likely related to the losartan medication.   Prediabetes: Your blood sugar levels are too high. Prediabetes means that you do not have diabetes, but you are at risk for development of diabetes if you do not work to reduce your blood sugar levels. Decrease consumption of fast food, fried food, processed carbohydrates (chips, cookies, etc), sugary drinks, sweets. Increase consumption of fresh fruits and vegetables, whole grains, water. Exercise will also lower these levels.   We will continue to monitor these levels in the future.  Complete lab work prior to leaving today. I will notify you of your results once received.   Please schedule a follow up visit in 6 weeks for re-evaluation of anxiety.   It was a pleasure to meet you today! Please don't hesitate to call me with any questions. Welcome to [facility]!

## 2016-10-27 NOTE — Assessment & Plan Note (Signed)
Continue atorvastatin 40 mg. Repeat lipids in 3 months. Discussed the importance of a healthy diet and regular exercise in order for weight loss, and to reduce the risk of other medical diseases.

## 2016-10-27 NOTE — Assessment & Plan Note (Signed)
Left CEA on 02/16. Following with vascular surgery. Continue statin and BP control.

## 2016-10-27 NOTE — Progress Notes (Signed)
Pre visit review using our clinic review tool, if applicable. No additional management support is needed unless otherwise documented below in the visit note. 

## 2016-10-27 NOTE — Progress Notes (Signed)
Subjective:    Patient ID: Jason Melendez, male    DOB: [birth year], 61 y.o.   MRN: [id number]  HPI  Ms. Jason Melendez is a 61 year old male who presents today to establish care, for hospital follow up, and discuss the problems mentioned below. Will obtain old records.  1) Essential Hypertension: Currently managed on carvedilol 6.25 mg BID and losartan 100 mg. Both the losartan and carvedilol were initiated during his initial hospitalization for CVA and carotid artery stenosis. He's checking his BP at home which is running 130's/60-70's. His BP in the office today is stable. He has noticed a cough since being in the hospital for which he describes as a "tickle". This overall isn't bothersome. He denies fevers, chills, congestion.  2) Hyperlipidemia: Currently managed on atorvastatin 40 mg that he was taking 1 year ago. He lost 25 pounds and then stopped taking his Lipitor. This medication was restarted in the hospital recently. Last lipid panel above goal on 10/13/16.   3) Emergency Department Follow Up:  Presented to [facility] form the vascular surgery clinic on 10/20/16 with a chief complaint of bradycardia. He underwent left carotid endarterectomy on 10/16/16 and was at the vascular surgery center for further evaluation of drainage from the surgical site. The provider at the surgery clinic was massaging his carotid when he suddenly felt bradycardic and became hypotensive. He was sent to th ED as they couldn't get his hypotension or bradycardia to resolve.  During his stay in the ED he underwent orthostatic vital signs and examination which were stable and unremarkable. He had no drainage from the surgical site. He was discharged home later that day.  4) Hospital Follow Up/CVA/Carotid Stenosis:  Presented to [facility] on 10/12/16 with complaints of right sided weakness, difficulty with fine motor skills, right sided chest pain, difficulty with speech intermittently x 3 days.   During his stay  in the ED he underwent CT head (subacute stroke), d-dimer (elevated), troponin (negative). He did experience an episode of bradycardia when the nurse placed a second IV, felt clammy and weak. He was also noted to have hypertension initially and was treated with IV hydralazine. He was admitted for further evaluation and treatment.   During his hospital stay he underwent MRA Neck (50% stenosis bilateral proximal internal carotid artery), MRA head (occluded mild to distal callosomarginal branch of the anterior cerebral artery), 2D echo (EF of 40-45%, dilation of left atrium), CTA neck (50% left ICA/CCA stenosis with calcification). Neurology and Cardiology consulted. He underwent Lexiscan Myoview (intermediate risk). He the underwent left carotid endarectomy with bovine pericardial patch angioplasty on 02/16, did well during surgery and post-op.  He was discharged home on 10/18/16 with recommendations to follow up with PCP (CBC, BMP as he was intiated on Losartan during hospital stay). He is also to follow up with vascular surgery, cardiology, and neurology.  Since his discharge home he's followed with vascular surgery (see note above). He is scheduled to see cardiology on 11/12/16, vascular surgery on 11/02/16, neurology 11/30/16. He is working to set up PT and OT. He will be getting FMLA paperwork completed through his vascular surgeon. He's still experiencing numbness to the tips of his fingers and weakness to his right upper extremity, but overall doing well. He denies sore throat, difficulty swallowing, redness/pain, purulent drainage from surgical site. He is working towards a healthy lifestyle with changes in diet and plans on exercising soon.  5) Generalized Anxiety Disorder: Years of symptoms of daily worry,  anxiety, feeling tense. He works in a high stress occupation and will often work 10-14 hour days. He is currently caring for his mother who has Alzheimer's Disease, recently had a stroke, recently  went left CEA. He is very nervous about returning to work and is afraid he won't be able to perform like he once did. His family is concerned about his ongoing worry and anxiety in regards to his health. GAD 7 score of 15 today. He was once managed on medication in the past but cannot remember the name. Denies SI/HI.  Review of Systems  Constitutional: Negative for fever.  HENT: Negative for trouble swallowing.   Eyes: Negative for visual disturbance.  Respiratory: Negative for shortness of breath.   Cardiovascular: Negative for chest pain.  Endocrine: Negative for polyuria.  Skin:       Scabbing to surgical site  Neurological: Positive for weakness and numbness. Negative for dizziness.       No new symptoms, residual from CVA  Hematological: Negative for adenopathy.  Psychiatric/Behavioral: Negative for sleep disturbance and suicidal ideas. The patient is nervous/anxious.        Past Medical History:  Diagnosis Date  . HTN (hypertension)   . Hyperlipidemia      Social History   Social History  . Marital status: Married    Spouse name: N/A  . Number of children: N/A  . Years of education: N/A   Occupational History  . Not on file.   Social History Main Topics  . Smoking status: Never Smoker  . Smokeless tobacco: Never Used  . Alcohol use No  . Drug use: No  . Sexual activity: Not Currently   Other Topics Concern  . Not on file   Social History Narrative   Marital status: married      Children: 2 daughters      Lives:  With wife      Employment: printing work      Tobacco: none      Alcohol: one beer every three months      Drugs:  None      Exercise:  No formal exercise; walks up three flights of stairs several times per day at work.    Past Surgical History:  Procedure Laterality Date  . ENDARTERECTOMY Left 10/16/2016   Procedure: ENDARTERECTOMY CAROTID;  Surgeon: Nada LibmanVance W Brabham, MD;  [facility];  Service: Vascular;  Laterality: Left;  . HERNIA REPAIR     . PATCH ANGIOPLASTY Left 10/16/2016   Procedure: PATCH ANGIOPLASTY USING Livia SnellenXENOSURE BIOLOGIC PATCH;  Surgeon: Nada LibmanVance W Brabham, MD;  [facility];  Service: Vascular;  Laterality: Left;    Family History  Problem Relation Age of Onset  . Diabetes Mother   . Heart disease Father [id number]    CABG  . Diabetes Father   . Kidney disease Father   . Kidney failure Father   . Hypertension Sister     No Known Allergies  Current Outpatient Prescriptions on File Prior to Visit  Medication Sig Dispense Refill  . aspirin 325 MG tablet Take 1 tablet (325 mg total) by mouth daily. 60 tablet 0  . atorvastatin (LIPITOR) 40 MG tablet Take 1 tablet (40 mg total) by mouth daily at 6 PM. 30 tablet 1  . carvedilol (COREG) 6.25 MG tablet Take 1 tablet (6.25 mg total) by mouth 2 (two) times daily with a meal. 60 tablet 0  . docusate sodium (COLACE) 100 MG capsule Take 1 capsule (100 mg total)  by mouth daily. 10 capsule 0  . losartan (COZAAR) 100 MG tablet Take 1 tablet (100 mg total) by mouth daily. 30 tablet 0  . oxyCODONE-acetaminophen (ROXICET) 5-325 MG tablet Take 1 tablet by mouth every 6 (six) hours as needed for severe pain. 8 tablet 0   No current facility-administered medications on file prior to visit.     BP (!) 144/86   Pulse 71   Temp 98.2 F (36.8 C) (Oral)   Ht 6' (1.829 m)   Wt 254 lb (115.2 kg)   SpO2 96%   BMI 34.45 kg/m    Objective:   Physical Exam  Constitutional: He is oriented to person, place, and time. He appears well-nourished.  Eyes: Pupils are equal, round, and reactive to light.  Neck: Neck supple.  Cardiovascular: Normal rate and regular rhythm.   Pulmonary/Chest: Effort normal and breath sounds normal. He has no wheezes. He has no rales.  Neurological: He is alert and oriented to person, place, and time. No cranial nerve deficit. Coordination normal.  Skin: Skin is warm and dry.  Scabbing to surgical site. No purulent drainage, erythema, tenderness.  Psychiatric:  He has a normal mood and affect.          Assessment & Plan:  Hospital Follow Up:  Presented to [facility] on 02/12, diagnosed with CVA. Also noted to have carotid stenosis to left side. Underwent left CEA on 02/16, overall doing well. Placed on losartan and carvedilol during hospital stay. Suspect losartan causing cough. Will have him continue for now as this may dissipate. Would also not like to start two new medications at the same time as he was initiated on Lexapro today. He is scheduled and understands his follow up visits.  Long discussion regarding importance of healthy diet and exercise. Will be getting set up with PT/OT soon. CBC and BMP pending.  Overall doing well, neuro exam stable. BP stable considering recent stroke, home readings lower. All hospital notes, labs, imaging reviewed.  Morrie Sheldon, NP

## 2016-10-28 LAB — CBC
HCT: 44 % (ref 39.0–52.0)
Hemoglobin: 14.8 g/dL (ref 13.0–17.0)
MCHC: 33.6 g/dL (ref 30.0–36.0)
MCV: 92.2 fl (ref 78.0–100.0)
Platelets: 218 10*3/uL (ref 150.0–400.0)
RBC: 4.77 Mil/uL (ref 4.22–5.81)
RDW: 13 % (ref 11.5–15.5)
WBC: 8.4 10*3/uL (ref 4.0–10.5)

## 2016-10-28 LAB — BASIC METABOLIC PANEL
BUN: 21 mg/dL (ref 6–23)
CO2: 29 mEq/L (ref 19–32)
Calcium: 10.1 mg/dL (ref 8.4–10.5)
Chloride: 103 mEq/L (ref 96–112)
Creatinine, Ser: 1.17 mg/dL (ref 0.40–1.50)
GFR: 67.54 mL/min (ref >60.00)
Glucose, Bld: 115 mg/dL — ABNORMAL HIGH (ref 70–99)
Potassium: 5 mEq/L (ref 3.5–5.1)
Sodium: 140 mEq/L (ref 135–145)

## 2016-10-29 ENCOUNTER — Encounter: Payer: Self-pay | Admitting: Primary Care

## 2016-10-29 ENCOUNTER — Encounter: Payer: Self-pay | Admitting: Physical Therapy

## 2016-10-29 ENCOUNTER — Ambulatory Visit: Payer: 59 | Attending: Neurology | Admitting: Physical Therapy

## 2016-10-29 ENCOUNTER — Encounter: Payer: Self-pay | Admitting: Occupational Therapy

## 2016-10-29 ENCOUNTER — Ambulatory Visit: Payer: 59 | Admitting: Occupational Therapy

## 2016-10-29 VITALS — BP 154/88 | HR 82

## 2016-10-29 DIAGNOSIS — R2681 Unsteadiness on feet: Secondary | ICD-10-CM | POA: Insufficient documentation

## 2016-10-29 DIAGNOSIS — M6281 Muscle weakness (generalized): Secondary | ICD-10-CM

## 2016-10-29 DIAGNOSIS — I69351 Hemiplegia and hemiparesis following cerebral infarction affecting right dominant side: Secondary | ICD-10-CM

## 2016-10-29 DIAGNOSIS — R262 Difficulty in walking, not elsewhere classified: Secondary | ICD-10-CM | POA: Insufficient documentation

## 2016-10-29 NOTE — Therapy (Signed)
[facility] [address] [place], [place], [address] Phone: [phone]   Fax:  [phone]  Physical Therapy Evaluation  Patient Details  Name: Jason Melendez MRN: [id number] Date of Birth: 1956/01/22 Referring Provider: Marvel Plan, MD  Encounter Date: 10/29/2016      PT End of Session - 10/29/16 1205    Visit Number 1   Number of Visits 13   Date for PT Re-Evaluation 12/13/16   Authorization Type [employer]   PT Start Time 0800   PT Stop Time 0850   PT Time Calculation (min) 50 min   Activity Tolerance Patient tolerated treatment well   Behavior During Therapy [facility] for tasks assessed/performed      Past Medical History:  Diagnosis Date  . HTN (hypertension)   . Hyperlipidemia     Past Surgical History:  Procedure Laterality Date  . ENDARTERECTOMY Left 10/16/2016   Procedure: ENDARTERECTOMY CAROTID;  Surgeon: Nada Libman, MD;  Location: [facility] OR;  Service: Vascular;  Laterality: Left;  . HERNIA REPAIR    . PATCH ANGIOPLASTY Left 10/16/2016   Procedure: PATCH ANGIOPLASTY USING Livia Snellen BIOLOGIC PATCH;  Surgeon: Nada Libman, MD;  Location: [facility] OR;  Service: Vascular;  Laterality: Left;    Vitals:   10/29/16 0815  BP: (!) 154/88  Pulse: 82         Subjective Assessment - 10/29/16 0816    Subjective Pt presents to OPPT s/o L CVA, L carotid endarterectomy with residual R sided weakness.    Patient is accompained by: Family member   Limitations Other (comment)  work duties and stairs   Patient Stated Goals To be able to return to work and climb stairs with confidence   Currently in Pain? No/denies            [facility] PT Assessment - 10/29/16 1027      Assessment   Medical Diagnosis L CVA with R sided weakness   Onset Date/Surgical Date 10/12/16   Hand Dominance Right   Next MD Visit Monday at 10am   Prior Therapy Respiratory therapy     Precautions   Precautions Other  (comment)   Precaution Comments  L carotid stenosis with endarterectomy     Balance Screen   Has the patient fallen in the past 6 months No     Home Environment   Living Environment Private residence   Living Arrangements Spouse/significant other;Children   Available Help at Discharge Family;Available PRN/intermittently   Type of Home House   Home Access Stairs to enter   Entrance Stairs-Number of Steps 5 + 1   Entrance Stairs-Rails Right;Left;Can reach both   Home Layout One level   Home Equipment None     Prior Function   Level of Independence Independent   Vocation Full time employment   Vocation Requirements 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; at computer all day  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, rifle and shotgun competitions     Cognition   Overall Cognitive Status Difficult to assess  Pt very verbose and tangental in coversation   Difficult to assess due to --  unsure of pt baseline   Attention Sustained   Sustained Attention Impaired     Observation/Other Assessments   Focus on Therapeutic Outcomes (FOTO)  64 (36% limited); 23% predicted limitation   Other Surveys  Other Surveys   Stroke Impact Scale  Physical: 63.9%     Sensation  Light Touch Appears Intact     Coordination   Gross Motor Movements are Fluid and Coordinated No   Fine Motor Movements are Fluid and Coordinated No     Strength   Overall Strength Deficits   Overall Strength Comments 5/5 LLE, 4/5 RLE but delayed activation     Ambulation/Gait   Ambulation/Gait Yes   Ambulation/Gait Assistance 6: Modified independent (Device/Increase time)   Ambulation Distance (Feet) 200 Feet   Assistive device None   Gait Pattern Step-through pattern;Decreased arm swing - right;Decreased arm swing - left;Decreased trunk rotation;Wide base of support  cautious gait   Gait velocity 11.28 seconds or 2.9   Stairs Yes   Stairs Assistance 6: Modified independent (Device/Increase time)    Stair Management Technique Two rails;Alternating pattern;Forwards   Number of Stairs 8   Height of Stairs 6     Standardized Balance Assessment   Standardized Balance Assessment Berg Balance Test;10 meter walk test   10 Meter Walk 2.9 ft/sec     Berg Balance Test   Sit to Stand Able to stand without using hands and stabilize independently   Standing Unsupported Able to stand safely 2 minutes   Sitting with Back Unsupported but Feet Supported on Floor or Stool Able to sit safely and securely 2 minutes   Stand to Sit Sits safely with minimal use of hands   Transfers Able to transfer safely, minor use of hands   Standing Unsupported with Eyes Closed Able to stand 10 seconds safely   Standing Ubsupported with Feet Together Able to place feet together independently and stand 1 minute safely   From Standing, Reach Forward with Outstretched Arm Can reach confidently >25 cm (10")   From Standing Position, Pick up Object from Floor Able to pick up shoe, needs supervision   From Standing Position, Turn to Look Behind Over each Shoulder Looks behind from both sides and weight shifts well   Turn 360 Degrees Able to turn 360 degrees safely one side only in 4 seconds or less   Standing Unsupported, Alternately Place Feet on Step/Stool Able to stand independently and safely and complete 8 steps in 20 seconds   Standing Unsupported, One Foot in Front Able to place foot tandem independently and hold 30 seconds   Standing on One Leg Able to lift leg independently and hold > 10 seconds   Total Score 54       Patient demonstrates increased fall risk as noted by score of  54/56 on Berg Balance Scale.  (<36= high risk for falls, close to 100%; 37-45 significant >80%; 46-51 moderate >50%; 52-55 lower >25%        PT Education - 10/29/16 1205    Education provided Yes   Education Details Clinical findings, PT POC and goals   Person(s) Educated Patient   Methods Explanation   Comprehension Verbalized  understanding           PT Long Term Goals - 10/29/16 1236      PT LONG TERM GOAL #1   Title (TARGET DATE FOR LTG 12/13/16) Pt will be independent with balance and strengthening HEP   Status New     PT LONG TERM GOAL #2   Title Pt will demonstrate improved gait velocity to >4 ft/sec   Status New     PT LONG TERM GOAL #3   Title 6 Min walk test goal TBD     PT LONG TERM GOAL #4   Title Pt will negotiate >12 stairs  with alternating sequence and 2 rails independently   Status New     PT LONG TERM GOAL #5   Title Pt will improve BERG balance to 56/56 and FGA goal TBD   Status New            Plan - 10/29/16 1206    Clinical Impression Statement Pt is a 61 year old male presenting to OPPT neuro for medium complexity PT evaluation for L CVA with R hemiparesis. Pt's PMH significant for the following: HTN and recent endarterectomy-lifting restriction. The following deficits were noted during pt's exam: R hemiparesis, impaired weight shifting to R side, impaired sustained mm activation, delayed initiation, impaired balance, impaired gait, impaired activity tolerance/endurance. Pt also very verbose and tangental and required cues to sustain attention but appears to be pt's baseline-will continue to monitor. Pt's gait speed indicates pt is safe for community ambulation but is below average for normal walking speed. Pt's BERG score indicates pt is at low risk for falls. Pt would benefit from skilled PT to address these impairments and functional limitations to maximize functional mobility independence and reduce falls risk.   Rehab Potential Good   PT Frequency 2x / week   PT Duration 6 weeks   PT Treatment/Interventions ADLs/Self Care Home Management;Gait training;Stair training;Functional mobility training;Therapeutic activities;Therapeutic exercise;Balance training;Neuromuscular re-education;Patient/family education;Energy conservation   PT Next Visit Plan Perform 6 minute walk test, FGA  and set goals, stair negotiation, balance-weight shifting to R, dual task gait training/balance, aerobic conditioning!   Consulted and Agree with Plan of Care Patient      Patient will benefit from skilled therapeutic intervention in order to improve the following deficits and impairments:  Cardiopulmonary status limiting activity, Decreased activity tolerance, Decreased balance, Decreased endurance, Decreased strength, Difficulty walking  Visit Diagnosis: Hemiplegia and hemiparesis following cerebral infarction affecting right dominant side (HCC)  Unsteadiness on feet  Difficulty in walking, not elsewhere classified  Muscle weakness (generalized)     Problem List Patient Active Problem List   Diagnosis Date Noted  . Prediabetes 10/27/2016  . GAD (generalized anxiety disorder) 10/27/2016  . Stenosis of left carotid artery   . Cardiomyopathy (HCC)   . CVA (cerebral vascular accident) (HCC) 10/13/2016  . Hypertensive heart disease without CHF 10/13/2016  . HLD (hyperlipidemia) 10/13/2016  . Obesity (BMI 30.0-34.9)     Edman CircleAudra Hall, PT, DPT 10/29/16    12:47 PM   [facility] [address] [place], [place], [address] Phone: [phone]   Fax:  [phone]  Name: Jason Melendez MRN: [id number] Date of Birth: 1956/05/16

## 2016-10-29 NOTE — Therapy (Signed)
[facility] [address] [place], [place], [address] Phone: [phone]   Fax:  [phone]  Occupational Therapy Evaluation  Patient Details  Name: Jason Melendez MRN: [id number] Date of Birth: May 19, 1956 Referring Provider: Dr. Vernona Rieger  Encounter Date: 10/29/2016      OT End of Session - 10/29/16 1234    Visit Number 1   Number of Visits 8   Date for OT Re-Evaluation 11/26/16   Authorization Type [employer] - await info on any visit limitations.    OT Start Time 1020   OT Stop Time 1059   OT Time Calculation (min) 39 min   Activity Tolerance Patient tolerated treatment well      Past Medical History:  Diagnosis Date  . HTN (hypertension)   . Hyperlipidemia     Past Surgical History:  Procedure Laterality Date  . ENDARTERECTOMY Left 10/16/2016   Procedure: ENDARTERECTOMY CAROTID;  Surgeon: Nada Libman, MD;  Location: [facility] OR;  Service: Vascular;  Laterality: Left;  . HERNIA REPAIR    . PATCH ANGIOPLASTY Left 10/16/2016   Procedure: PATCH ANGIOPLASTY USING Livia Snellen BIOLOGIC PATCH;  Surgeon: Nada Libman, MD;  Location: [facility] OR;  Service: Vascular;  Laterality: Left;    There were no vitals filed for this visit.      Subjective Assessment - 10/29/16 1026    Patient is accompained by: Family member  sister   Pertinent History see epic, L CVA, s/p L endarterectomy   Patient Stated Goals get my R hand stronger so I can go back to work   Currently in Pain? No/denies           [facility] OT Assessment - 10/29/16 1027      Assessment   Diagnosis L CVA   Referring Provider Dr. Vernona Rieger   Onset Date 10/12/16   Prior Therapy no therapy prior to outpatient neuro     Precautions   Precautions Other (comment)   Precaution Comments L carotoid stenosis;  no lifting at all for right now - pt to see vascular surgeon.       Restrictions   Weight Bearing Restrictions Yes   Other  Position/Activity Restrictions Pt is not allowed to lift at this point.      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; Pharmacist, hospital   Leisure archery,rifle, shotgun competitons     ADL   Eating/Feeding Modified independent   Grooming Modified independent   Education officer, environmental Dressing Independent   Lower Body Dressing Minimal assistance  pt has not attempted to tie shoes/can do buttons/zippers   Glass blower/designer - Conservator, museum/gallery -  Retail banker  IADL   Shopping Assistance for transportation   [employer]keeping --  pt did not do prior to L CVA   Meal Prep Able to complete simple cold meal and snack prep   Medication Management Takes responsibility if medication is prepared in advance in seperate dosage  wife is setting it up "to be niceEducational psychologist"   Financial Management Manages financial matters independently (budgets, writes checks, pays rent, bills goes to bank), collects and keeps track of income     Written Expression   Dominant Hand Right   Handwriting 100% legible  for name in cursive/close to how he wrote before     Vision - History   Baseline Vision Wears glasses all the time   Additional Comments Pt states  initially not as clear but now feels it is at baseline     Vision Assessment   Comment Pt denies any current issues with vision     Activity Tolerance   Activity Tolerance Tolerate 30+ min activity without fatigue     Cognition   Overall Cognitive Status Within Functional Limits for tasks assessed   Mini Mental State Exam  Pt noticing some word finding problems.  Sister says overall he is good just "not as tight on his game."  Unable to state why - pt and sister deny changes in attention, ST memory or safety. 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" and had to have every thing "just so and tell you how it had to be Just so."  Will continue to monitor     Sensation   Light Touch Appears Intact   Hot/Cold Appears Intact   Proprioception Appears Intact     Coordination   Gross Motor Movements are Fluid and Coordinated Yes   9 Hole Peg Test Right   Right 9 Hole Peg Test 24.41     Tone   Assessment Location Right Upper Extremity     ROM / Strength   AROM / PROM / Strength AROM;Strength     AROM   Overall AROM  Within functional limits for tasks performed     Strength   Overall Strength Unable to assess   Overall Strength Comments Pt not allowed to lift anything due to recent surgery - unable to complete MMT however strength at least 3/5 as pt can move against gravity.  Will more fully assess once lifting restrictions are lifted.      Hand Function   Right Hand Gross Grasp Impaired   Right Hand Grip (lbs) 60 then 40 with second trial   Right Hand Lateral Pinch 10 lbs   Right Hand 3 Point Pinch 8 lbs   Left Hand Gross Grasp Functional   Left Hand Grip (lbs) 100   Left Hand Lateral Pinch 20 lbs   Left 3 point pinch 16 lbs   Comment 2pt equal in both hands     RUE Tone   RUE Tone Within Functional Limits                              OT Long Term Goals - 10/29/16 1221      OT LONG TERM GOAL #1   Title Pt will be mod  I with HEP - 11/26/2016   Status New     OT LONG TERM GOAL #2   Title Pt will demonstrate improved sustained grip strength by at least 5 pounds to increase functional  use (baseline=60, second trial 40)   Status New     OT LONG TERM GOAL #3   Title Pt will demonstrate improved pinch strength for ease in using computer and camera by at least 3 pounds for 3 pt and 5 pounds for lateral)   Status New     OT LONG TERM GOAL #4   Title Once lifting restrictions lifted further assess LUE proximal strength and set goal as appropriate.    Status New     OT LONG TERM GOAL #5   Title Pt will verbalize understanding of signs/symptoms as well as risk factors for CVA   Status New               Plan - 10/29/16 1225    Clinical Impression Statement Pt is a 61 year old male s/p L CVA on 10/12/2016 as well as L carotoid endarterectomy on 10/18/16.  Post surgery pt did have episode of brachycardia with hypotension.  Discharged home on 10/28/2016.  PMH: HTN, HLD, cardiomyopathy, anxiety disorder.  Pt presents today with RUE weakness, impaired grip and pinch strength and decreased sustained strength in RUE that impact IADL, leisure and return to work.  Pt will benefit from short course of skilled OT to maximize functional use of R hand   Rehab Potential Good   OT Frequency 2x / week   OT Duration 4 weeks   OT Treatment/Interventions Therapeutic exercise;Neuromuscular education;DME and/or AE instruction;Patient/family education   Plan initiate HEP for grip and pinch strength, if lifting restictions d/c then MMT LUE and set goal.   Consulted and Agree with Plan of Care Patient;Family member/caregiver   Family Member Consulted sister      Patient will benefit from skilled therapeutic intervention in order to improve the following deficits and impairments:  Decreased strength, Impaired UE functional use  Visit Diagnosis: Muscle weakness (generalized) - Plan: Ot plan of care cert/re-cert  Hemiplegia and  hemiparesis following cerebral infarction affecting right dominant side (HCC) - Plan: Ot plan of care cert/re-cert    Problem List Patient Active Problem List   Diagnosis Date Noted  . Prediabetes 10/27/2016  . GAD (generalized anxiety disorder) 10/27/2016  . Stenosis of left carotid artery   . Cardiomyopathy (HCC)   . CVA (cerebral vascular accident) (HCC) 10/13/2016  . Hypertensive heart disease without CHF 10/13/2016  . HLD (hyperlipidemia) 10/13/2016  . Obesity (BMI 30.0-34.9)     Norton Pastel, OTR/L 10/29/2016, 12:37 PM  [facility] [address] [place], [place], [address] Phone: [phone]   Fax:  [phone]  Name: Jason Melendez MRN: [id number] Date of Birth: [birth year]

## 2016-11-02 ENCOUNTER — Ambulatory Visit (INDEPENDENT_AMBULATORY_CARE_PROVIDER_SITE_OTHER): Payer: Self-pay | Admitting: Surgery

## 2016-11-02 ENCOUNTER — Encounter: Payer: Self-pay | Admitting: Occupational Therapy

## 2016-11-02 ENCOUNTER — Encounter: Payer: Self-pay | Admitting: Surgery

## 2016-11-02 ENCOUNTER — Ambulatory Visit: Payer: 59 | Admitting: Occupational Therapy

## 2016-11-02 ENCOUNTER — Encounter: Payer: Self-pay | Admitting: Physical Therapy

## 2016-11-02 ENCOUNTER — Ambulatory Visit: Payer: 59 | Admitting: Physical Therapy

## 2016-11-02 VITALS — BP 130/77 | HR 68 | Temp 98.2°F | Resp 20 | Ht 72.0 in | Wt 255.0 lb

## 2016-11-02 DIAGNOSIS — R262 Difficulty in walking, not elsewhere classified: Secondary | ICD-10-CM

## 2016-11-02 DIAGNOSIS — I69351 Hemiplegia and hemiparesis following cerebral infarction affecting right dominant side: Secondary | ICD-10-CM

## 2016-11-02 DIAGNOSIS — R2681 Unsteadiness on feet: Secondary | ICD-10-CM

## 2016-11-02 DIAGNOSIS — I6522 Occlusion and stenosis of left carotid artery: Secondary | ICD-10-CM

## 2016-11-02 DIAGNOSIS — M6281 Muscle weakness (generalized): Secondary | ICD-10-CM

## 2016-11-02 NOTE — Progress Notes (Signed)
   Patient name: Jason Melendez MRN: [id number] DOB: October 18, 1955 Sex: male  REASON FOR VISIT:     post op CEA  HISTORY OF PRESENT ILLNESS:   Jason Melendez is a 61 y.o. male returns today for his first postoperative visit.  He is status post left carotid endarterectomy on 10/16/2016.  This was done following a left brain stroke.  His workup included a CT angiogram which revealed 50% stenosis in the left internal carotid artery and no significant right carotid stenosis.  We debated a while as to whether or not to fix this given the degree of stenosis but ultimately proceeded with repair.  Intraoperative findings included a moderate amount of intraluminal debris which was most likely the cause of his stroke.  The patient has residual limitations with fine motor skills in the right hand as well as strength.  He states that after approximately 5 minutes he cannot hold a cell phone up to his ear without his arm dropping.  He reports no new neurologic symptoms.  He is currently working with physical therapy.  CURRENT MEDICATIONS:    Current Outpatient Prescriptions  Medication Sig Dispense Refill  . aspirin 325 MG tablet Take 1 tablet (325 mg total) by mouth daily. 60 tablet 0  . atorvastatin (LIPITOR) 40 MG tablet Take 1 tablet (40 mg total) by mouth daily at 6 PM. 30 tablet 1  . carvedilol (COREG) 6.25 MG tablet Take 1 tablet (6.25 mg total) by mouth 2 (two) times daily with a meal. 60 tablet 0  . escitalopram (LEXAPRO) 10 MG tablet Take 1 tablet (10 mg total) by mouth daily. 30 tablet 1  . losartan (COZAAR) 100 MG tablet Take 1 tablet (100 mg total) by mouth daily. 30 tablet 0   No current facility-administered medications for this visit.     REVIEW OF SYSTEMS:   [X]  denotes positive finding, [ ]  denotes negative finding Cardiac  Comments:  Chest pain or chest pressure:    Shortness of breath upon exertion:    Short of breath when lying  flat:    Irregular heart rhythm:    Constitutional    Fever or chills:      PHYSICAL EXAM:   Vitals:   11/02/16 1008 11/02/16 1009  BP: 127/83 130/77  Pulse: 68   Resp: 20   Temp: 98.2 F (36.8 C)   TempSrc: Oral   SpO2: 96%   Weight: 255 lb (115.7 kg)   Height: 6' (1.829 m)     GENERAL: The patient is a well-nourished male, in no acute distress. The vital signs are documented above. CARDIOVASCULAR: There is a regular rate and rhythm. PULMONARY: Non-labored respirations Incision is healing nicely  STUDIES:   None   MEDICAL ISSUES:   Status post left carotid endarterectomy for symptomatic stenosis (left brain stroke).  The patient is making an excellent recovery.  He is continuing to work with physical therapy.  He does not have any weight lifting restrictions at this time.  He is not yet ready to return to work.  He will likely need an additional 2-4 weeks before he can return to work.  During that time he will continue to work with physical therapy.  I have him scheduled for a carotid duplex follow-up in 6 months.  Durene CalWells Sharise Lippy, MD [facility] Tel [phone] Pager [phone]

## 2016-11-02 NOTE — Therapy (Signed)
[facility] [address] [place], [place], [address] Phone: [phone]   Fax:  [phone]  Occupational Therapy Treatment  Patient Details  Name: Jason RibasWarner R Rebert Melendez MRN: [id number] Date of Birth: Jan 09, 1956 Referring Provider: Dr. Vernona RiegerKatherine Clark  Encounter Date: 11/02/2016      OT End of Session - 11/02/16 0926    Visit Number 2   Number of Visits 8   Date for OT Re-Evaluation 11/26/16   Authorization Type [employer] - await info on any visit limitations.    OT Start Time [phone]   OT Stop Time 0928   OT Time Calculation (min) 42 min   Activity Tolerance Patient tolerated treatment well      Past Medical History:  Diagnosis Date  . HTN (hypertension)   . Hyperlipidemia     Past Surgical History:  Procedure Laterality Date  . ENDARTERECTOMY Left 10/16/2016   Procedure: ENDARTERECTOMY CAROTID;  Surgeon: Nada LibmanVance W Brabham, MD;  [facility];  Service: Vascular;  Laterality: Left;  . HERNIA REPAIR    . PATCH ANGIOPLASTY Left 10/16/2016   Procedure: PATCH ANGIOPLASTY USING Livia SnellenXENOSURE BIOLOGIC PATCH;  Surgeon: Nada LibmanVance W Brabham, MD;  [facility];  Service: Vascular;  Laterality: Left;    There were no vitals filed for this visit.      Subjective Assessment - 11/02/16 0848    Subjective  I see the dr right after my therapies and will ask about the lifting restrictions.    Pertinent History see epic, L CVA, s/p L endarterectomy   Patient Stated Goals get my R hand stronger so I can go back to work   Currently in Pain? No/denies                      OT Treatments/Exercises (OP) - 11/02/16 0001      ADLs   ADL Comments Reviewed signs and symptoms of stroke as well as stroke risk factors.  Answered questions and provided handout.  Pt verbalized understanding.      Exercises   Exercises Hand     Hand Exercises   Theraputty - Grip geen putty for grip strength and pinch. Pt issued HEP and able to  return demonstrate all activities.    Hand Gripper with Small Beads Gripper on #2 until pt's liftng restrictions cleared.  Pt able to pick up small blocks and then able to make stacks of 4 with min dropping.  Once cued to slow down and focus on trying not to drop peformance improved.               Balance Exercises - 11/02/16 0845      Balance Exercises: Standing   Step Ups Lateral;6 inch  eyes open, eyes closed x 12 reps each, no UE support           OT Education - 11/02/16 0918    Education provided Yes   Education Details putty for grip and pinch strength   Person(s) Educated Patient   Methods Explanation;Demonstration;Verbal cues;Handout   Comprehension Verbalized understanding;Returned demonstration             OT Long Term Goals - 11/02/16 0919      OT LONG TERM GOAL #1   Title Pt will be mod I with HEP - 11/26/2016   Status On-going     OT LONG TERM GOAL #2   Title Pt will demonstrate improved sustained grip strength by at least 5 pounds to  increase functional use (baseline=60, second trial 40)   Status On-going     OT LONG TERM GOAL #3   Title Pt will demonstrate improved pinch strength for ease in using computer and camera by at least 3 pounds for 3 pt and 5 pounds for lateral)   Status On-going     OT LONG TERM GOAL #4   Title Once lifting restrictions lifted further assess LUE proximal strength and set goal as appropriate.    Status On-going     OT LONG TERM GOAL #5   Title Pt will verbalize understanding of signs/symptoms as well as risk factors for CVA   Status On-going               Plan - 11/02/16 1610    Clinical Impression Statement Pt progressing toward goals. Pt to see MD today and will ask about lifting restrictions.  Pt able to return demonstrate HEP.     Rehab Potential Good   OT Frequency 2x / week   OT Duration 4 weeks   OT Treatment/Interventions Therapeutic exercise;Neuromuscular education;DME and/or AE  instruction;Patient/family education   Plan if possible assess proximal strength of RUE and set goal, provide HEP as appropriate.  Check on HEP for putty   Consulted and Agree with Plan of Care Patient      Patient will benefit from skilled therapeutic intervention in order to improve the following deficits and impairments:  Decreased strength, Impaired UE functional use  Visit Diagnosis: Muscle weakness (generalized)  Hemiplegia and hemiparesis following cerebral infarction affecting right dominant side [facility])    Problem List Patient Active Problem List   Diagnosis Date Noted  . Prediabetes 10/27/2016  . GAD (generalized anxiety disorder) 10/27/2016  . Stenosis of left carotid artery   . Cardiomyopathy (HCC)   . CVA (cerebral vascular accident) (HCC) 10/13/2016  . Hypertensive heart disease without CHF 10/13/2016  . HLD (hyperlipidemia) 10/13/2016  . Obesity (BMI 30.0-34.9)     Norton Pastel, OTR/L 11/02/2016, 9:33 AM  [facility] [address] [place], [place], [address] Phone: [phone]   Fax:  [phone]  Name: Jason Melendez MRN: [id number] Date of Birth: [birth year]

## 2016-11-02 NOTE — Patient Instructions (Signed)
1. Grip Strengthening (Resistive Putty)  Green putty.  You may initially have some muscle soreness like you "worked out."  This is normal.  If you have true pain in your hand reduce the number of repetitions and work toward the 20 reps.  YOu should not have joint pain. You can take a rest as you do this - resting in between will NOT affect the strengthening benefit of this exercise.     Squeeze putty using thumb and all fingers. Repeat _20___ times. Do __2__ sessions per day.   2. Roll putty into tube on table and pinch between your index finger, middle finger and thumb.  Do 3 repetition rolling out the tube between each one.  Then repeat pinching using the side or your index finger and your thumb (like you are holding a key).  Do 3 repetitions of these.  Try and do 2 sessions per day.      Copyright  VHI. All rights reserved.

## 2016-11-02 NOTE — Therapy (Addendum)
[facility] [address] [place], [place], [address] Phone: [phone]   Fax:  [phone]  Physical Therapy Treatment  Patient Details  Name: Tami RibasWarner R Ronning Jr MRN: [id number] Date of Birth: 02/05/1956 Referring Provider: Marvel PlanXu Jindong, MD  Encounter Date: 11/02/2016      PT End of Session - 11/02/16 1703    Visit Number 2   Number of Visits 13   Date for PT Re-Evaluation 12/13/16   Authorization Type [employer]   PT Start Time 0803   PT Stop Time 0846   PT Time Calculation (min) 43 min   Activity Tolerance Patient tolerated treatment well   Behavior During Therapy [facility] for tasks assessed/performed      Past Medical History:  Diagnosis Date  . HTN (hypertension)   . Hyperlipidemia     Past Surgical History:  Procedure Laterality Date  . ENDARTERECTOMY Left 10/16/2016   Procedure: ENDARTERECTOMY CAROTID;  Surgeon: Nada LibmanVance W Brabham, MD;  [facility];  Service: Vascular;  Laterality: Left;  . HERNIA REPAIR    . PATCH ANGIOPLASTY Left 10/16/2016   Procedure: PATCH ANGIOPLASTY USING Livia SnellenXENOSURE BIOLOGIC PATCH;  Surgeon: Nada LibmanVance W Brabham, MD;  [facility];  Service: Vascular;  Laterality: Left;    There were no vitals filed for this visit.      Subjective Assessment - 11/02/16 0806    Subjective Pt reports no issues over the weekend, unable to practice using computer mouse.  Has appointment with surgeon later this am; hopes lifting restrictions will be lifted today.   Patient is accompained by: Family member   Limitations Other (comment)   Patient Stated Goals To be able to return to work and climb stairs with confidence   Currently in Pain? No/denies            [facility] Assessment - 11/02/16 0808      6 Minute Walk- Baseline   6 Minute Walk- Baseline yes   BP (mmHg) 100/76   HR (bpm) 73   02 Sat (%RA) 98 %   Modified Borg Scale for Dyspnea 0- Nothing at all   Perceived Rate of Exertion  (Borg) 6-     6 Minute walk- Post Test   6 Minute Walk Post Test yes   BP (mmHg) 128/78   HR (bpm) 83   02 Sat (%RA) 97 %   Modified Borg Scale for Dyspnea 1- Very mild shortness of breath   Perceived Rate of Exertion (Borg) 12-     6 minute walk test results    Aerobic Endurance Distance Walked 1122   Endurance additional comments norms for pt's age: 61-1877 ft-[phone] ft     Functional Gait  Assessment   Gait assessed  Yes   Gait Level Surface Walks 20 ft in less than 7 sec but greater than 5.5 sec, uses assistive device, slower speed, mild gait deviations, or deviates 6-10 in outside of the 12 in walkway width.   Change in Gait Speed Makes only minor adjustments to walking speed, or accomplishes a change in speed with significant gait deviations, deviates 10-15 in outside the 12 in walkway width, or changes speed but loses balance but is able to recover and continue walking.  minor changes in gait speed   Gait with Horizontal Head Turns Performs head turns smoothly with no change in gait. Deviates no more than 6 in outside 12 in walkway width   Gait with Vertical Head Turns Performs head turns with no  change in gait. Deviates no more than 6 in outside 12 in walkway width.   Gait and Pivot Turn Pivot turns safely within 3 sec and stops quickly with no loss of balance.   Step Over Obstacle Is able to step over one shoe box (4.5 in total height) but must slow down and adjust steps to clear box safely. May require verbal cueing.   Gait with Narrow Base of Support Ambulates 7-9 steps.   Gait with Eyes Closed Walks 20 ft, slow speed, abnormal gait pattern, evidence for imbalance, deviates 10-15 in outside 12 in walkway width. Requires more than 9 sec to ambulate 20 ft.   Ambulating Backwards Walks 20 ft, no assistive devices, good speed, no evidence for imbalance, normal gait   Steps Alternating feet, must use rail.   Total Score 21   FGA comment: 21/30 medium falls risk            Balance  Exercises - 11/02/16 0845      Balance Exercises: Standing   Step Ups Lateral;6 inch  eyes open, eyes closed x 12 reps each, no UE support      Plan: Clinical Impression Statement: Treatment session with focus on continued assessment of endurance with and balance during gait with FGA. Pt distance during is below average for age specific norms and FGA indicates medium falls risk with higher level challenges. Initiated LE strengthening, balance and endurance training.  Pt will benefit from skilled therapeutic intervention in order to improve on the following deficits :Cardiopulmonary status limiting activity; Decreased activity tolerance; Decreased balance; Decreased endurance; Decreased strength; Difficulty walking  Rehab Potential: Good  PT Treatment/Interventions: ADLs/Self Care Home Management; Gait training; Stair training; Functional mobility training; Therapeutic activities; Therapeutic exercise; Balance training; Neuromuscular re-education; Patient/family education; Energy conservation  PT Next Visit Plan: HEP, RLE strengthening, stair negotiation, balance-weight shifting to R, dual task gait training/balance, aerobic conditioning!  Consulted and Agree with Plan of Care: Patient       PT Long Term Goals - 11/02/16 1705      PT LONG TERM GOAL #1   Title (TARGET DATE FOR LTG 12/13/16) Pt will be independent with balance and strengthening HEP   Status New     PT LONG TERM GOAL #2   Title Pt will demonstrate improved gait velocity to >4 ft/sec   Status New     PT LONG TERM GOAL #3   Title Pt will improve endurance as indicated by increase in 6 MWT distance by 200 feet   Baseline 1122 ft   Status New     PT LONG TERM GOAL #4   Title Pt will negotiate >12 stairs with alternating sequence and 2 rails independently   Status New     PT LONG TERM GOAL #5   Title Pt will improve BERG balance to 56/56 and FGA to >23/30 for decreased falls risk   Baseline FGA: 21/30    Status New       Patient will benefit from skilled therapeutic intervention in order to improve the following deficits and impairments:  Cardiopulmonary status limiting activity, Decreased activity tolerance, Decreased balance, Decreased endurance, Decreased strength, Difficulty walking  Visit Diagnosis: Muscle weakness (generalized)  Hemiplegia and hemiparesis following cerebral infarction affecting right dominant side (HCC)  Unsteadiness on feet  Difficulty in walking, not elsewhere classified     Problem List Patient Active Problem List   Diagnosis Date Noted  . Prediabetes 10/27/2016  . GAD (generalized anxiety disorder) 10/27/2016  .  Stenosis of left carotid artery   . Cardiomyopathy (HCC)   . CVA (cerebral vascular accident) (HCC) 10/13/2016  . Hypertensive heart disease without CHF 10/13/2016  . HLD (hyperlipidemia) 10/13/2016  . Obesity (BMI 30.0-34.9)    Edman Circle, PT, DPT 11/02/16    5:09 PM    [facility] [address] [place], [place], [address] Phone: [phone]   Fax:  [phone]  Name: Loring Liskey MRN: [id number] Date of Birth: 1956/02/04

## 2016-11-03 ENCOUNTER — Telehealth: Payer: Self-pay | Admitting: Primary Care

## 2016-11-03 NOTE — Telephone Encounter (Signed)
Called the patient about the Psychology Referral. Patient says he feels a lot better on the medicine and wants to hold off on the referral at this time. I gave him my phone number and told him to call me if he decides he wants help making the appt with Behavioural health.

## 2016-11-03 NOTE — Telephone Encounter (Signed)
Noted  

## 2016-11-05 ENCOUNTER — Encounter: Payer: Self-pay | Admitting: Interventional Cardiology

## 2016-11-06 ENCOUNTER — Ambulatory Visit: Payer: 59 | Admitting: Rehabilitation

## 2016-11-06 ENCOUNTER — Encounter: Payer: Self-pay | Admitting: Rehabilitation

## 2016-11-06 DIAGNOSIS — I69351 Hemiplegia and hemiparesis following cerebral infarction affecting right dominant side: Secondary | ICD-10-CM | POA: Diagnosis not present

## 2016-11-06 DIAGNOSIS — R262 Difficulty in walking, not elsewhere classified: Secondary | ICD-10-CM

## 2016-11-06 DIAGNOSIS — M6281 Muscle weakness (generalized): Secondary | ICD-10-CM

## 2016-11-06 DIAGNOSIS — R2681 Unsteadiness on feet: Secondary | ICD-10-CM

## 2016-11-06 NOTE — Therapy (Signed)
[facility] [address] [place], [place], [address] Phone: [phone]   Fax:  [phone]  Physical Therapy Treatment  Patient Details  Name: Jason Melendez MRN: [id number] Date of Birth: Nov 06, 1955 Referring Provider: Marvel PlanXu Jindong, MD  Encounter Date: 11/06/2016      PT End of Session - 11/06/16 1024    Visit Number 3   Number of Visits 13   Date for PT Re-Evaluation 12/13/16   Authorization Type [employer]   PT Start Time 0801   PT Stop Time 0845   PT Time Calculation (min) 44 min   Activity Tolerance Patient tolerated treatment well   Behavior During Therapy [facility] for tasks assessed/performed      Past Medical History:  Diagnosis Date  . HTN (hypertension)   . Hyperlipidemia     Past Surgical History:  Procedure Laterality Date  . ENDARTERECTOMY Left 10/16/2016   Procedure: ENDARTERECTOMY CAROTID;  Surgeon: Nada LibmanVance W Brabham, MD;  [facility];  Service: Vascular;  Laterality: Left;  . HERNIA REPAIR    . PATCH ANGIOPLASTY Left 10/16/2016   Procedure: PATCH ANGIOPLASTY USING Livia SnellenXENOSURE BIOLOGIC PATCH;  Surgeon: Nada LibmanVance W Brabham, MD;  [facility];  Service: Vascular;  Laterality: Left;    There were no vitals filed for this visit.      Subjective Assessment - 11/06/16 0804    Subjective Pt reports he has began driving yesterday.    Patient is accompained by: Family member   Limitations Other (comment)   Patient Stated Goals To be able to return to work and climb stairs with confidence   Currently in Pain? No/denies                         [facility] Adult PT Treatment/Exercise - 11/06/16 0001      Neuro Re-ed    Neuro Re-ed Details  Performed high level balance first in corner on pillows with EC and head turns-see pt instruction as these were added to HEP.  High level balance and gait with dual tasking-tossing hackey sac from R>L and L>R while walking x 115' then added him  naming movie titles in alphabetical order.  While standing on blue mat on ramp surface performed marching high and slowly x 20 reps, marching with head turns x 20 reps, cone taps alternating LEs while on ramp x 10 reps each     Exercises   Exercises Other Exercises   Other Exercises  Ended session with elliptical x 2 mins without resistance to work on endurance.  Tolerated well, but could note fatigue in LEs.       Pt did discuss that he plans to return to work in two weeks, even though MD wrote him out for 4 more weeks (MD seems okay with him returning early).  He plans to go home today and try to use his computer/mouse as much as possible (as in a work situation) to better know if he is able to return to work.            PT Education - 11/06/16 1023    Education provided Yes   Education Details additions to AT&THEP   Person(s) Educated Patient   Methods Explanation;Demonstration;Handout   Comprehension Returned demonstration;Verbalized understanding             PT Long Term Goals - 11/02/16 1705      PT LONG TERM GOAL #1   Title (TARGET DATE  FOR LTG 12/13/16) Pt will be independent with balance and strengthening HEP   Status New     PT LONG TERM GOAL #2   Title Pt will demonstrate improved gait velocity to >4 ft/sec   Status New     PT LONG TERM GOAL #3   Title Pt will improve endurance as indicated by increase in 6 MWT distance by 200 feet   Baseline 1122 ft   Status New     PT LONG TERM GOAL #4   Title Pt will negotiate >12 stairs with alternating sequence and 2 rails independently   Status New     PT LONG TERM GOAL #5   Title Pt will improve BERG balance to 56/56 and FGA to >23/30 for decreased falls risk   Baseline FGA: 21/30   Status New               Plan - 11/06/16 1024    Clinical Impression Statement Session focused on high level balance with emphasis on compliant surfaces, eyes closed, modified SLS for improved R weight shift and dual tasking  with gait.  Also initiated use of elliptical during session for endurance as this somewhat simulates stair climbing.  Pt tolerated well but with notable fatigue.     Rehab Potential Good   PT Treatment/Interventions ADLs/Self Care Home Management;Gait training;Stair training;Functional mobility training;Therapeutic activities;Therapeutic exercise;Balance training;Neuromuscular re-education;Patient/family education;Energy conservation   PT Next Visit Plan HEP (give two exercises from last session), RLE strengthening, stair negotiation, balance-weight shifting to R, dual task gait training/balance, aerobic conditioning!   Consulted and Agree with Plan of Care Patient      Patient will benefit from skilled therapeutic intervention in order to improve the following deficits and impairments:  Cardiopulmonary status limiting activity, Decreased activity tolerance, Decreased balance, Decreased endurance, Decreased strength, Difficulty walking  Visit Diagnosis: Muscle weakness (generalized)  Hemiplegia and hemiparesis following cerebral infarction affecting right dominant side (HCC)  Unsteadiness on feet  Difficulty in walking, not elsewhere classified     Problem List Patient Active Problem List   Diagnosis Date Noted  . Prediabetes 10/27/2016  . GAD (generalized anxiety disorder) 10/27/2016  . Stenosis of left carotid artery   . Cardiomyopathy (HCC)   . CVA (cerebral vascular accident) (HCC) 10/13/2016  . Hypertensive heart disease without CHF 10/13/2016  . HLD (hyperlipidemia) 10/13/2016  . Obesity (BMI 30.0-34.9)    Jason Melendez, PT, MPT [facility] [address] [place], [place], [address] Phone: [phone]   Fax:  [phone] 11/06/16, 10:28 AM  Name: Jason Melendez MRN: [id number] Date of Birth: Oct 10, 1955

## 2016-11-06 NOTE — Patient Instructions (Signed)
Stand in corner with high chair in front of you.  Choose a pillow that is not too soft (don't sink to the ground) or too thick.       Feet Together (Compliant Surface) Arm Motion - Eyes Closed    Stand on compliant surface: __pillow or cushion______ with feet together. Close eyes and keep arms by your side.  Repeat __3__ times per session for 30 secs each. Do __1-2__ sessions per day.  Copyright  VHI. All rights reserved.   Feet Together (Compliant Surface) Head Motion - Eyes Closed    Stand on compliant surface: ___pillow ____ with feet together. Close eyes and move head slowly, up and down x 10 reps, side to side x 10 reps.   Do __1-2__ sessions per day.  Copyright  VHI. All rights reserved.

## 2016-11-12 ENCOUNTER — Encounter: Payer: Self-pay | Admitting: *Deleted

## 2016-11-12 ENCOUNTER — Encounter: Payer: Self-pay | Admitting: Interventional Cardiology

## 2016-11-12 ENCOUNTER — Ambulatory Visit (INDEPENDENT_AMBULATORY_CARE_PROVIDER_SITE_OTHER): Payer: 59 | Admitting: Interventional Cardiology

## 2016-11-12 ENCOUNTER — Encounter: Payer: Self-pay | Admitting: Physical Therapy

## 2016-11-12 ENCOUNTER — Ambulatory Visit: Payer: 59 | Admitting: Physical Therapy

## 2016-11-12 VITALS — BP 128/82 | HR 78 | Ht 72.0 in | Wt 251.8 lb

## 2016-11-12 DIAGNOSIS — E784 Other hyperlipidemia: Secondary | ICD-10-CM | POA: Diagnosis not present

## 2016-11-12 DIAGNOSIS — R55 Syncope and collapse: Secondary | ICD-10-CM

## 2016-11-12 DIAGNOSIS — I6522 Occlusion and stenosis of left carotid artery: Secondary | ICD-10-CM

## 2016-11-12 DIAGNOSIS — I420 Dilated cardiomyopathy: Secondary | ICD-10-CM

## 2016-11-12 DIAGNOSIS — I119 Hypertensive heart disease without heart failure: Secondary | ICD-10-CM | POA: Diagnosis not present

## 2016-11-12 DIAGNOSIS — I69351 Hemiplegia and hemiparesis following cerebral infarction affecting right dominant side: Secondary | ICD-10-CM

## 2016-11-12 DIAGNOSIS — R2681 Unsteadiness on feet: Secondary | ICD-10-CM

## 2016-11-12 DIAGNOSIS — R262 Difficulty in walking, not elsewhere classified: Secondary | ICD-10-CM

## 2016-11-12 DIAGNOSIS — M6281 Muscle weakness (generalized): Secondary | ICD-10-CM

## 2016-11-12 DIAGNOSIS — E7849 Other hyperlipidemia: Secondary | ICD-10-CM

## 2016-11-12 HISTORY — DX: Syncope and collapse: R55

## 2016-11-12 MED ORDER — CARVEDILOL 6.25 MG PO TABS: 6.2500 mg | ORAL_TABLET | Freq: Two times a day (BID) | ORAL | 3 refills | Status: DC

## 2016-11-12 MED ORDER — LOSARTAN POTASSIUM 50 MG PO TABS: 50.0000 mg | ORAL_TABLET | Freq: Every day | ORAL | 3 refills | Status: DC

## 2016-11-12 NOTE — Patient Instructions (Signed)
Medication Instructions:  1) DECREASE Cozaar to 50mg  once daily.  Labwork: None  Testing/Procedures: None  Follow-Up: Your physician recommends that you schedule a follow-up appointment in: 3 months with Dr. Katrinka BlazingSmith.    Any Other Special Instructions Will Be Listed Below (If Applicable).     If you need a refill on your cardiac medications before your next appointment, please call your pharmacy.

## 2016-11-12 NOTE — Progress Notes (Signed)
Cardiology Office Note    Date:  11/12/2016   ID:  Adedamola, Seto [birth year], MRN [id number]  PCP:  Morrie Sheldon, NP  Cardiologist: Lesleigh Noe, MD   Chief Complaint  Patient presents with  . Congestive Heart Failure    History of Present Illness:  Jason Melendez is a 61 y.o. male with recent left brain stroke, high-grade left carotid stenosis treated with surgery by Dr. Myra Gianotti, asymptomatic systolic heart failure, hypertension, hyperlipidemia, and recurrent episodes of syncope (vasovagal).  Following the stroke, the patient is progressing. He is in occupational and physical therapy. We will consulted due to high blood pressure and decreased LV function in the setting of acute stroke. A myocardial perfusion study did not demonstrate evidence of coronary disease. LVEF was noted to be in the 40% range. He underwent carotid surgery without complications. No clinical evidence of heart failure occurred.  Since discharge from the hospital, he has had at least 2 episodes of transient right leg weakness. No other complaints occurred with these episodes. No palpitations were noted. Episodes lasted less than 30 minutes.  At completion of today's office visit, he became warm/diaphoretic, complained of dizziness and twitching all over, and had presyncope that dramatically improved with recumbency and elevation of his legs. After proximally 15 minutes of recumbency, and attempt to sit him up to recurrence of diaphoresis and near-syncope. A more prolonged transition from the supine position to standing occurred over the next hour in our office with intermittent monitoring. He was eventually able to stand without dizziness or diaphoresis. There was no associated nausea. His wife will drive home. This episode was very similar to one that occurred in Dr. Estanislado Spire office, which led to an emergency room visit following an ambulance ride. In speaking with the patient he has  had prior episodes of diaphoresis and near-syncope dating back to his mid 13s.  Past Medical History:  Diagnosis Date  . HTN (hypertension)   . Hyperlipidemia     Past Surgical History:  Procedure Laterality Date  . ENDARTERECTOMY Left 10/16/2016   Procedure: ENDARTERECTOMY CAROTID;  Surgeon: Nada Libman, MD;  Location: [facility] OR;  Service: Vascular;  Laterality: Left;  . HERNIA REPAIR    . PATCH ANGIOPLASTY Left 10/16/2016   Procedure: PATCH ANGIOPLASTY USING Livia Snellen BIOLOGIC PATCH;  Surgeon: Nada Libman, MD;  Location: [facility] OR;  Service: Vascular;  Laterality: Left;    Current Medications: Outpatient Medications Prior to Visit  Medication Sig Dispense Refill  . aspirin 325 MG tablet Take 1 tablet (325 mg total) by mouth daily. 60 tablet 0  . atorvastatin (LIPITOR) 40 MG tablet Take 1 tablet (40 mg total) by mouth daily at 6 PM. 30 tablet 1  . escitalopram (LEXAPRO) 10 MG tablet Take 1 tablet (10 mg total) by mouth daily. 30 tablet 1  . carvedilol (COREG) 6.25 MG tablet Take 1 tablet (6.25 mg total) by mouth 2 (two) times daily with a meal. 60 tablet 0  . losartan (COZAAR) 100 MG tablet Take 1 tablet (100 mg total) by mouth daily. 30 tablet 0   No facility-administered medications prior to visit.      Allergies:   Hydralazine hcl   Social History   Social History  . Marital status: Married    Spouse name: N/A  . Number of children: N/A  . Years of education: N/A   Social History Main Topics  . Smoking status: Never Smoker  . Smokeless tobacco: Never  Used  . Alcohol use No  . Drug use: No  . Sexual activity: Not Currently   Other Topics Concern  . None   Social History Narrative   Marital status: married      Children: 2 daughters      Lives:  With wife      Employment: printing work      Tobacco: none      Alcohol: one beer every three months      Drugs:  None      Exercise:  No formal exercise; walks up three flights of stairs several times per day at work.      Family History:  The patient's family history includes Diabetes in his father and mother; Heart disease (age of onset: 7460) in his father; Hypertension in his sister; Kidney disease in his father; Kidney failure in his father.   ROS:   Please see the history of present illness.    Intermittent episodes of right leg weakness.  All other systems reviewed and are negative.   PHYSICAL EXAM:   VS:  BP 128/82 (BP Location: Left Arm)   Pulse 78   Ht 6' (1.829 m)   Wt 251 lb 12.8 oz (114.2 kg)   BMI 34.15 kg/m    GEN: Well nourished, well developed, in no acute distress  HEENT: normal  Neck: no JVD or carotid bruit. There is a healing incision in the anterior left neck without fluctuance or drainage. No erythema is noted. Cardiac: RRR; no murmurs, rubs, or gallops,no edema  Respiratory:  clear to auscultation bilaterally, normal work of breathing GI: soft, nontender, nondistended, + BS MS: no deformity or atrophy  Skin: warm and dry, no rash Neuro:  Alert and Oriented x 3, Strength and sensation are intact Psych: euthymic mood, full affect  Wt Readings from Last 3 Encounters:  11/12/16 251 lb 12.8 oz (114.2 kg)  11/02/16 255 lb (115.7 kg)  10/27/16 254 lb (115.2 kg)      Studies/Labs Reviewed:   EKG:  EKG  Not repeated. I reviewed the recent EKGs performed in the emergency room, and they revealed junctional bradycardia followed by sinus tachycardia without acute ST-T wave change. These EKG tracings were done on 10/12/16 over a 5 hour time frame.  Recent Labs: 10/12/2016: ALT 18; B Natriuretic Peptide 27.0 10/27/2016: BUN 21; Creatinine, Ser 1.17; Hemoglobin 14.8; Platelets 218.0; Potassium 5.0; Sodium 140   Lipid Panel    Component Value Date/Time   CHOL 176 10/13/2016 0217   TRIG 206 (H) 10/13/2016 0217   HDL 26 (L) 10/13/2016 0217   CHOLHDL 6.8 10/13/2016 0217   VLDL 41 (H) 10/13/2016 0217   LDLCALC 109 (H) 10/13/2016 0217    Additional studies/ records that were  reviewed today include:   Myocardial perfusion study 10/15/16: IMPRESSION: 1. Mild attenuation of the left ventricular apex without evidence of inducible myocardial ischemia.  2. Global left ventricular hypokinesis with LV cavity dilatation.  3. Left ventricular ejection fraction 34%  4. Non invasive risk stratification*: Intermediate  Echocardiogram 10/13/16: Study Conclusions  - Left ventricle: The cavity size was mildly dilated. There was   mild focal basal hypertrophy of the septum. Systolic function was   mildly to moderately reduced. The estimated ejection fraction was   in the range of 40% to 45%. Diffuse hypokinesis. There was an   increased relative contribution of atrial contraction to   ventricular filling. Doppler parameters are consistent with   abnormal left ventricular relaxation (grade  1 diastolic   dysfunction). - Aortic valve: Trileaflet; mildly thickened, mildly calcified   leaflets. - Left atrium: The atrium was mildly dilated. - Pulmonary arteries: Systolic pressure could not be accurately   estimated.  ASSESSMENT:    1. Hypertensive heart disease without CHF   2. Neurocardiogenic syncope   3. Stenosis of left carotid artery   4. Dilated cardiomyopathy (HCC)   5. Other hyperlipidemia      PLAN:  In order of problems listed above:  1. No evidence of volume overload. He is on a reasonable heart failure regimen which includes carvedilol 6.25 mg twice a day and losartan 100 mg per day. Given the episode of hypotension/vasovagal episode, I have decided to decrease his losartan to 50 mg per day. He will monitor blood pressures at home. 2. Today's office visit was prolonged due to the development of near syncope related to vasovagal reaction/neurocardiogenic presyncope. As noted above losartan will be decreased to 50 mg per day. He's had multiple similar episodes over his life and. Interestingly, his last 2 physician visits have resulted in prolonged  hypotension with an episode of February leading to a 5 hour emergency room visit. I've advised him not to drive today. 3. Because of the recurring right lower extremity episodes of weakness, I have recommended that he speak to neurology. I have also decreasing intensity of antihypertensive therapy to avoid hypoperfusion. I also believe that he should see neurology sooner than is scheduled. They will call and try to arrange an earlier follow-up analyst might help if they are unsuccessful 4. The patient needs beta blocker and angiotensin system blockade to help improve LV function. Current therapy will remain as follows: Carvedilol 6.25 mg by mouth twice a day and losartan now decreased to 50 mg per day.  Clinical follow-up with cardiology in 4-6 months. Earlier if cardiac issues. Plan to repeat echocardiogram later this she to hopefully demonstrate return of LV function to normal. Teaching given concerning response to prodrome with vasovagal/neurocardiogenic episodes.  This was an exceedingly prolonged office visit because of recurring near syncope. 5.     Medication Adjustments/Labs and Tests Ordered: Current medicines are reviewed at length with the patient today.  Concerns regarding medicines are outlined above.  Medication changes, Labs and Tests ordered today are listed in the Patient Instructions below. Patient Instructions  Medication Instructions:  1) DECREASE Cozaar to 50mg  once daily.  Labwork: None  Testing/Procedures: None  Follow-Up: Your physician recommends that you schedule a follow-up appointment in: 3 months with Dr. Katrinka Blazing.    Any Other Special Instructions Will Be Listed Below (If Applicable).     If you need a refill on your cardiac medications before your next appointment, please call your pharmacy.      Signed, Lesleigh Noe, MD  11/12/2016 12:08 PM    [facility] [address] [place], [place], [place]  [address] Phone: [phone];  Fax: [phone]

## 2016-11-13 ENCOUNTER — Ambulatory Visit: Payer: 59 | Admitting: Occupational Therapy

## 2016-11-13 ENCOUNTER — Ambulatory Visit: Payer: 59 | Admitting: Physical Therapy

## 2016-11-13 VITALS — BP 153/80

## 2016-11-13 DIAGNOSIS — R2681 Unsteadiness on feet: Secondary | ICD-10-CM

## 2016-11-13 DIAGNOSIS — I69351 Hemiplegia and hemiparesis following cerebral infarction affecting right dominant side: Secondary | ICD-10-CM

## 2016-11-13 DIAGNOSIS — R262 Difficulty in walking, not elsewhere classified: Secondary | ICD-10-CM

## 2016-11-13 DIAGNOSIS — M6281 Muscle weakness (generalized): Secondary | ICD-10-CM

## 2016-11-13 NOTE — Therapy (Signed)
[facility] [address] [place], [place], [address] Phone: [phone]   Fax:  [phone]  Physical Therapy Treatment  Patient Details  Name: Jason Melendez MRN: [id number] Date of Birth: 01-09-1956 Referring Provider: Marvel Plan, MD  Encounter Date: 11/13/2016      PT End of Session - 11/13/16 1243    Visit Number 5   Number of Visits 13   Date for PT Re-Evaluation 12/13/16   Authorization Type [employer]   PT Start Time 1150   PT Stop Time 1235   PT Time Calculation (min) 45 min   Activity Tolerance Patient tolerated treatment well   Behavior During Therapy [facility] for tasks assessed/performed      Past Medical History:  Diagnosis Date  . HTN (hypertension)   . Hyperlipidemia     Past Surgical History:  Procedure Laterality Date  . ENDARTERECTOMY Left 10/16/2016   Procedure: ENDARTERECTOMY CAROTID;  Surgeon: Nada Libman, MD;  Location: [facility] OR;  Service: Vascular;  Laterality: Left;  . HERNIA REPAIR    . PATCH ANGIOPLASTY Left 10/16/2016   Procedure: PATCH ANGIOPLASTY USING Livia Snellen BIOLOGIC PATCH;  Surgeon: Nada Libman, MD;  Location: [facility] OR;  Service: Vascular;  Laterality: Left;    There were no vitals filed for this visit.      Subjective Assessment - 11/13/16 1241    Subjective Had syncope episode at cardiologist yesterday; cardiologist felt it was vasovagal.  Pt reports getting over heated during therapy yesterday and then rushing out to MD appointment without cooling down and without hydrating.  Pt needs report from PT with all standardized testing to take to HR to determine if he is able to begin light duty at work or not.   Patient is accompained by: Family member   Limitations Other (comment)   Patient Stated Goals To be able to return to work and climb stairs with confidence   Currently in Pain? No/denies            [facility] PT Assessment - 11/13/16 1220      Standardized Balance Assessment   Standardized Balance Assessment Five Times Sit to Stand   Five times sit to stand comments  15.44 seconds; no lightheadedness            Vestibular Assessment - 11/13/16 1212      Orthostatics   BP supine (x 5 minutes) 150/75   HR supine (x 5 minutes) 60   BP sitting 128/82   HR sitting 63   BP standing (after 1 minute) 114/76   HR standing (after 1 minute) 69   BP standing (after 3 minutes) 131/76   HR standing (after 3 minutes) 67           [facility] Adult PT Treatment/Exercise - 11/13/16 1225      Knee/Hip Exercises: Aerobic   Other Aerobic Nustep: L6 x 10 minutes with bilat UE and LE for endurance and UE/LE strengthening             PT Education - 11/13/16 1243    Education provided Yes   Education Details Results of standardized testing; orthostatic hypotension and possible need for compression stockings   Person(s) Educated Patient   Methods Explanation;Demonstration;Handout   Comprehension Verbalized understanding             PT Long Term Goals - 11/02/16 1705      PT LONG TERM GOAL #1   Title (TARGET DATE FOR LTG 12/13/16)  Pt will be independent with balance and strengthening HEP   Status New     PT LONG TERM GOAL #2   Title Pt will demonstrate improved gait velocity to >4 ft/sec   Status New     PT LONG TERM GOAL #3   Title Pt will improve endurance as indicated by increase in 6 MWT distance by 200 feet   Baseline 1122 ft   Status New     PT LONG TERM GOAL #4   Title Pt will negotiate >12 stairs with alternating sequence and 2 rails independently   Status New     PT LONG TERM GOAL #5   Title Pt will improve BERG balance to 56/56 and FGA to >23/30 for decreased falls risk   Baseline FGA: 21/30   Status New               Plan - 11/13/16 1244    Clinical Impression Statement Treatment session today focused on further standardized testing to assess for orthostatic, functional LE strength as well as  bilat UE and LE strengthening and endurance training.  Pt tolerated well with no episodes of lightheadedness or syncope despite BP dropping with changes in position.  Pt may benefit from compression stockings when returning to work and performing prolonged sitting > abrupt standing.  Pt to discuss with cardiologist.   PT Treatment/Interventions ADLs/Self Care Home Management;Gait training;Stair training;Functional mobility training;Therapeutic activities;Therapeutic exercise;Balance training;Neuromuscular re-education;Patient/family education;Energy conservation   PT Next Visit Plan RLE strengthening, stair negotiation, balance-weight shifting to R, dual task gait training/balance, aerobic conditioning (avoid elliptical due to knee pain)   Consulted and Agree with Plan of Care Patient      Patient will benefit from skilled therapeutic intervention in order to improve the following deficits and impairments:  Cardiopulmonary status limiting activity, Decreased activity tolerance, Decreased balance, Decreased endurance, Decreased strength, Difficulty walking  Visit Diagnosis: Muscle weakness (generalized)  Hemiplegia and hemiparesis following cerebral infarction affecting right dominant side (HCC)  Unsteadiness on feet  Difficulty in walking, not elsewhere classified     Problem List Patient Active Problem List   Diagnosis Date Noted  . Neurocardiogenic syncope 11/12/2016  . Prediabetes 10/27/2016  . GAD (generalized anxiety disorder) 10/27/2016  . Stenosis of left carotid artery   . Cardiomyopathy (HCC)   . CVA (cerebral vascular accident) (HCC) 10/13/2016  . Hypertensive heart disease without CHF 10/13/2016  . HLD (hyperlipidemia) 10/13/2016  . Obesity (BMI 30.0-34.9)     Edman CircleAudra Hall, PT, DPT 11/13/16    12:48 PM    [facility] [address] [place], [place], [address] Phone: [phone]   Fax:  [phone]  Name:  Jason Melendez MRN: [id number] Date of Birth: 12/28/1955

## 2016-11-13 NOTE — Patient Instructions (Signed)
Geryl CouncilmanWarner Rodrigues Jr was seen by occupational therapy for the following standardized testing:  9 hole peg test: right hand: 24.53 secs, left hand: 22.37 secs  Grip strength:right hand : 105 lbs, 105 lbs left hand 105 lbs , 115 lbs (mean strength for males 60-61 yrs old right; 89.7 lbs, left: 76.8 lbs)  Pinch strength RUE: 3 pt pinch- 22 lbs, lateral pinch- 20 lbs         LUE: 3 pt pinch- 21 lbs, lateral pinch- 24 lbs  Overall arm strength was tested with manual muscle test. Right arm: proximal shoulder strength 4+/5(mildly reduced) distal strength 5/5 Left arm : 5/5  Keene BreathKathryn Audreyanna Butkiewicz, OTR/L [phone] Phone: [phone] 11:23 AM 11/13/16

## 2016-11-13 NOTE — Therapy (Signed)
[facility] [address] [place], [place], [address] Phone: [phone]   Fax:  [phone]  Occupational Therapy Treatment  Patient Details  Name: Jason Melendez MRN: [id number] Date of Birth: 11-27-1955 Referring Provider: Dr. Vernona Rieger  Encounter Date: 11/13/2016      OT End of Session - 11/13/16 1131    Visit Number 3   Number of Visits 8   Date for OT Re-Evaluation 11/26/16   Authorization Type [employer] -    Authorization Time Period 23 visits   Authorization - Visit Number 3   Authorization - Number of Visits 23   OT Start Time 1020   OT Stop Time 1100   OT Time Calculation (min) 40 min   Activity Tolerance Patient tolerated treatment well   Behavior During Therapy Jason Melendez for tasks assessed/performed      Past Medical History:  Diagnosis Date  . HTN (hypertension)   . Hyperlipidemia     Past Surgical History:  Procedure Laterality Date  . ENDARTERECTOMY Left 10/16/2016   Procedure: ENDARTERECTOMY CAROTID;  Surgeon: Nada Libman, MD;  Location: [facility] OR;  Service: Vascular;  Laterality: Left;  . HERNIA REPAIR    . PATCH ANGIOPLASTY Left 10/16/2016   Procedure: PATCH ANGIOPLASTY USING Livia Snellen BIOLOGIC PATCH;  Surgeon: Nada Libman, MD;  Location: [facility] OR;  Service: Vascular;  Laterality: Left;    Vitals:   11/13/16 1138  BP: (!) 153/80        Subjective Assessment - 11/13/16 1126    Subjective  Pt reports he was cleared by MD from all precautions and he was also cleared to return to work next week, pt requests a note for his work from [employer].   Pertinent History see epic, L CVA, s/p L endarterectomy   Patient Stated Goals get my R hand stronger so I can go back to work   Currently in Pain? No/denies            Treatment: Discussed pt plans to return to work,standardized  testing performed and note written with results- see pt instructions. Arm bike x 6 mins level 3 for  conditioning                  OT Education - 11/13/16 1127    Education provided Yes   Education Details Results of standardized testing for return to work, pt was provided with a note, see pt instructions   Person(s) Educated Patient   Methods Explanation;Demonstration;Verbal cues;Handout   Comprehension Verbalized understanding;Returned demonstration;Verbal cues required             OT Long Term Goals - 11/13/16 1036      OT LONG TERM GOAL #1   Title Pt will be mod I with HEP - 11/26/2016   Status Achieved     OT LONG TERM GOAL #2   Title Pt will demonstrate improved sustained grip strength by at least 5 pounds to increase functional use (baseline=60, second trial 40)   Status Achieved     OT LONG TERM GOAL #3   Title Pt will demonstrate improved pinch strength for ease in using computer and camera by at least 3 pounds for 3 pt and 5 pounds for lateral)   Status Achieved     OT LONG TERM GOAL #4   Title I with HEP for proximal strengthening   Baseline RUE proximal strength grossly 4+/5, distal 5/5- pt reports decreased endurance   Time 4  Period Weeks   Status New     OT LONG TERM GOAL #5   Title Pt will verbalize understanding of signs/symptoms as well as risk factors for CVA   Time 4   Period Weeks   Status On-going               Plan - 11/13/16 1128    Clinical Impression Statement Pt reports he was cleared by MD to return to work on Monday. Therapist performed standardized testing today and provided pt with a note to take to his work. See pt instructions.   Rehab Potential Good   OT Frequency 2x / week   OT Duration 4 weeks   OT Treatment/Interventions Therapeutic exercise;Neuromuscular education;DME and/or AE instruction;Patient/family education   Plan check putty HEP, provide pt with proximal strength HEP. Pt may be ready to d/c in next few visits.   Consulted and Agree with Plan of Care Patient      Patient will benefit from  skilled therapeutic intervention in order to improve the following deficits and impairments:  Decreased strength, Impaired UE functional use  Visit Diagnosis: Muscle weakness (generalized)  Hemiplegia and hemiparesis following cerebral infarction affecting right dominant side (HCC)  Unsteadiness on feet    Problem List Patient Active Problem List   Diagnosis Date Noted  . Neurocardiogenic syncope 11/12/2016  . Prediabetes 10/27/2016  . GAD (generalized anxiety disorder) 10/27/2016  . Stenosis of left carotid artery   . Cardiomyopathy (HCC)   . CVA (cerebral vascular accident) (HCC) 10/13/2016  . Hypertensive heart disease without CHF 10/13/2016  . HLD (hyperlipidemia) 10/13/2016  . Obesity (BMI 30.0-34.9)     [name] 11/13/2016, 11:38 AM Keene BreathKathryn Tazaria Dlugosz, OTR/L [phone] Phone: [phone] 11:38 AM 11/13/16 [facility] [address] [place], [place], [address] Phone: [phone]   Fax:  [phone]  Name: Jason Melendez MRN: [id number] Date of Birth: 05-Jul-1956

## 2016-11-13 NOTE — Therapy (Signed)
[facility] [address] [place], [place], [address] Phone: [phone]   Fax:  [phone]  Physical Therapy Treatment  Patient Details  Name: Jason Melendez MRN: [id number] Date of Birth: [birth year] Referring Provider: Marvel Plan, MD  Encounter Date: 11/12/2016      PT End of Session - 11/12/16 0913    Visit Number 4   Number of Visits 13   Date for PT Re-Evaluation 12/13/16   Authorization Type [employer]   PT Start Time 0800   PT Stop Time 0843   PT Time Calculation (min) 43 min   Equipment Utilized During Treatment Gait belt   Activity Tolerance Patient tolerated treatment well   Behavior During Therapy [facility] for tasks assessed/performed      Past Medical History:  Diagnosis Date  . HTN (hypertension)   . Hyperlipidemia     Past Surgical History:  Procedure Laterality Date  . ENDARTERECTOMY Left 10/16/2016   Procedure: ENDARTERECTOMY CAROTID;  Surgeon: Nada Libman, MD;  Location: [facility] OR;  Service: Vascular;  Laterality: Left;  . HERNIA REPAIR    . PATCH ANGIOPLASTY Left 10/16/2016   Procedure: PATCH ANGIOPLASTY USING Livia Snellen BIOLOGIC PATCH;  Surgeon: Nada Libman, MD;  Location: [facility] OR;  Service: Vascular;  Laterality: Left;    There were no vitals filed for this visit.      Subjective Assessment - 11/12/16 0810    Subjective No new complaints. No falls. Plans to begin working Monday hopefully.    Patient is accompained by: Family member   Limitations Other (comment)   Patient Stated Goals To be able to return to work and climb stairs with confidence             [facility] Adult PT Treatment/Exercise - 11/12/16 0841      Knee/Hip Exercises: Aerobic   Elliptical level 1.0 x 1 minute fwd and then 1 minute bwd with cues on posture. incr knee pain reported therefore limited time on elliptical. knee pain resolved with walking afterwards             Balance Exercises -  11/12/16 0842      Balance Exercises: Standing   Standing Eyes Closed Narrow base of support (BOS);Wide (BOA);Head turns;Foam/compliant surface;Other reps (comment);Limitations;30 secs   Rockerboard Anterior/posterior;Lateral;Head turns;EC;EO;30 seconds;10 reps;Intermittent UE support     Balance Exercises: Standing   Standing Eyes Closed Limitations on airex in corner with chair in front for safety: wide base of support then progressed to narrow base of support with all of these:   EC no head movements for 30 sec's x 3 reps, progressing to EC head movements left<>right and up<>down x 10 each. no UE support with min guard to min assist for balance. cues on posture and weight shifting to assist with balance.                                      Rebounder Limitations performed both ways on balance board in parallel bars: EO rocking board with emphasis on tall posture and weight shifing. with holding the board steady: EC no head movements, progressing to EC head movements left<>right and up<>down. no UE support to light touch on bars with EC activites. min guard to min assist for balance with cues on posture and weight shifting to assist with balance.  PT Long Term Goals - 11/02/16 1705      PT LONG TERM GOAL #1   Title (TARGET DATE FOR LTG 12/13/16) Pt will be independent with balance and strengthening HEP   Status New     PT LONG TERM GOAL #2   Title Pt will demonstrate improved gait velocity to >4 ft/sec   Status New     PT LONG TERM GOAL #3   Title Pt will improve endurance as indicated by increase in 6 MWT distance by 200 feet   Baseline 1122 ft   Status New     PT LONG TERM GOAL #4   Title Pt will negotiate >12 stairs with alternating sequence and 2 rails independently   Status New     PT LONG TERM GOAL #5   Title Pt will improve BERG balance to 56/56 and FGA to >23/30 for decreased falls risk   Baseline FGA: 21/30    Status New            Plan - 11/12/16 0921    Clinical Impression Statement Today's session focused on high level balance activites and activity tolerance activities. Limited on elliptical due to bil knee pain from old injuries, ? try treadmill or seated bike for activity tolerance in future sessions. No issues reported with balance activities except for mild fatigue. Pt reports he wants to return to work on Monday and needs a note from PT/OT on what his limitations are. Explained to pt that we do not provide that information, his MD will have too. The most we can offer is copies of the evaluation after signing a medical release. Pt verbalized understanding. Pt is making steady progress toward goals and should benefit from continued PT to progress toward unmet goals.                                 Rehab Potential Good   PT Treatment/Interventions ADLs/Self Care Home Management;Gait training;Stair training;Functional mobility training;Therapeutic activities;Therapeutic exercise;Balance training;Neuromuscular re-education;Patient/family education;Energy conservation   PT Next Visit Plan RLE strengthening, stair negotiation, balance-weight shifting to R, dual task gait training/balance, aerobic conditioning (avoid elliptical due to knee pain)   Consulted and Agree with Plan of Care Patient      Patient will benefit from skilled therapeutic intervention in order to improve the following deficits and impairments:  Cardiopulmonary status limiting activity, Decreased activity tolerance, Decreased balance, Decreased endurance, Decreased strength, Difficulty walking  Visit Diagnosis: Muscle weakness (generalized)  Hemiplegia and hemiparesis following cerebral infarction affecting right dominant side (HCC)  Unsteadiness on feet  Difficulty in walking, not elsewhere classified     Problem List Patient Active Problem List   Diagnosis Date Noted  . Neurocardiogenic syncope 11/12/2016  .  Prediabetes 10/27/2016  . GAD (generalized anxiety disorder) 10/27/2016  . Stenosis of left carotid artery   . Cardiomyopathy (HCC)   . CVA (cerebral vascular accident) (HCC) 10/13/2016  . Hypertensive heart disease without CHF 10/13/2016  . HLD (hyperlipidemia) 10/13/2016  . Obesity (BMI 30.0-34.9)     Sallyanne KusterKathy Ferrin Liebig, PTA, [facility] [address], [address] [place], [place] [address] [phone] 11/13/16, 9:24 AM   Name: Tami RibasWarner R Bortle Jr MRN: [id number] Date of Birth: 12/08/1955

## 2016-11-16 ENCOUNTER — Ambulatory Visit: Payer: 59 | Admitting: Physical Therapy

## 2016-11-16 ENCOUNTER — Ambulatory Visit: Payer: 59 | Admitting: Occupational Therapy

## 2016-11-16 ENCOUNTER — Encounter: Payer: Self-pay | Admitting: Physical Therapy

## 2016-11-16 ENCOUNTER — Encounter: Payer: Self-pay | Admitting: Occupational Therapy

## 2016-11-16 DIAGNOSIS — R2681 Unsteadiness on feet: Secondary | ICD-10-CM

## 2016-11-16 DIAGNOSIS — M6281 Muscle weakness (generalized): Secondary | ICD-10-CM

## 2016-11-16 DIAGNOSIS — I69351 Hemiplegia and hemiparesis following cerebral infarction affecting right dominant side: Secondary | ICD-10-CM | POA: Diagnosis not present

## 2016-11-16 DIAGNOSIS — R262 Difficulty in walking, not elsewhere classified: Secondary | ICD-10-CM

## 2016-11-16 NOTE — Patient Instructions (Signed)
You have been given the next level of putty today - blue.  I would recommend that you upgrade to the blue slowly.  Only use the blue for grip strength not for pinching.  Continue to use the green for pinching.  When you start to transition to blue for grip strength, do 10 with the green and 10 with the blue for a week.  Next week do 15 with the blue an 5 with the green and then the third week do all 20 with the blue.    You can use the putty intermittently to maintain your current strength.  This means you do not necessarily have to use it every day - you can but you don't have. Monitor to make sure you don't lose strength if you are not using the putty every day.

## 2016-11-16 NOTE — Therapy (Addendum)
[facility] [address] [place], [place], [address] Phone: [phone]   Fax:  [phone]  Physical Therapy Treatment  Patient Details  Name: Jason Melendez MRN: [id number] Date of Birth: 1956/03/28 Referring Provider: Rosalin Hawking, MD  Encounter Date: 11/16/2016      PT End of Session - 11/16/16 0848    Visit Number 6   Number of Visits 13   Date for PT Re-Evaluation 12/13/16   Authorization Type [employer]   PT Start Time [phone]   PT Stop Time 0930   PT Time Calculation (min) 43 min   Activity Tolerance Patient tolerated treatment well   Behavior During Therapy [facility] for tasks assessed/performed      Past Medical History:  Diagnosis Date  . HTN (hypertension)   . Hyperlipidemia     Past Surgical History:  Procedure Laterality Date  . ENDARTERECTOMY Left 10/16/2016   Procedure: ENDARTERECTOMY CAROTID;  Surgeon: Serafina Mitchell, MD;  Location: [place];  Service: Vascular;  Laterality: Left;  . HERNIA REPAIR    . PATCH ANGIOPLASTY Left 10/16/2016   Procedure: PATCH ANGIOPLASTY USING Rueben Bash BIOLOGIC PATCH;  Surgeon: Serafina Mitchell, MD;  Location: [place];  Service: Vascular;  Laterality: Left;    There were no vitals filed for this visit.      Subjective Assessment - 11/16/16 0847    Subjective Has not had anymore episodes. Returns to work today.    Limitations Other (comment)  watch HR, h/o syncope   Patient Stated Goals To be able to return to work and climb stairs with confidence   Currently in Pain? No/denies            [facility] PT Assessment - 11/16/16 0901      Ambulation/Gait   Ambulation/Gait Yes   Ambulation/Gait Assistance 7: Independent   Assistive device None   Gait velocity 7.03sec's= 4.67 ft/sec no AD     6 Minute Walk- Baseline   6 Minute Walk- Baseline yes   HR (bpm) 68   02 Sat (%RA) 98 %   Modified Borg Scale for Dyspnea 0- Nothing at all   Perceived Rate of  Exertion (Borg) 6-     6 Minute walk- Post Test   6 Minute Walk Post Test yes   BP (mmHg) 172/81   HR (bpm) 75   02 Sat (%RA) 98 %   Modified Borg Scale for Dyspnea 0.5- Very, very slight shortness of breath   Perceived Rate of Exertion (Borg) 10-     6 minute walk test results    Aerobic Endurance Distance Walked 1607   Endurance additional comments norms for pt's age: [phone] ft     Berg Balance Test   Sit to Stand Able to stand without using hands and stabilize independently   Standing Unsupported Able to stand safely 2 minutes   Sitting with Back Unsupported but Feet Supported on Floor or Stool Able to sit safely and securely 2 minutes   Stand to Sit Sits safely with minimal use of hands   Transfers Able to transfer safely, minor use of hands   Standing Unsupported with Eyes Closed Able to stand 10 seconds safely   Standing Ubsupported with Feet Together Able to place feet together independently and stand 1 minute safely   From Standing, Reach Forward with Outstretched Arm Can reach confidently >25 cm (10")   From Standing Position, Pick up Object from Floor Able to pick up shoe safely and  easily   From Standing Position, Turn to Look Behind Over each Shoulder Looks behind from both sides and weight shifts well   Turn 360 Degrees Able to turn 360 degrees safely in 4 seconds or less  < 2 sec's each way   Standing Unsupported, Alternately Place Feet on Step/Stool Able to stand independently and safely and complete 8 steps in 20 seconds   Standing Unsupported, One Foot in Front Able to place foot tandem independently and hold 30 seconds   Standing on One Leg Able to lift leg independently and hold > 10 seconds   Total Score 56     Functional Gait  Assessment   Gait assessed  Yes   Gait Level Surface Walks 20 ft in less than 5.5 sec, no assistive devices, good speed, no evidence for imbalance, normal gait pattern, deviates no more than 6 in outside of the 12 in walkway width.    Change in Gait Speed Able to smoothly change walking speed without loss of balance or gait deviation. Deviate no more than 6 in outside of the 12 in walkway width.   Gait with Horizontal Head Turns Performs head turns smoothly with no change in gait. Deviates no more than 6 in outside 12 in walkway width   Gait with Vertical Head Turns Performs head turns with no change in gait. Deviates no more than 6 in outside 12 in walkway width.   Gait and Pivot Turn Pivot turns safely within 3 sec and stops quickly with no loss of balance.   Step Over Obstacle Is able to step over 2 stacked shoe boxes taped together (9 in total height) without changing gait speed. No evidence of imbalance.   Gait with Narrow Base of Support Is able to ambulate for 10 steps heel to toe with no staggering.   Gait with Eyes Closed Walks 20 ft, uses assistive device, slower speed, mild gait deviations, deviates 6-10 in outside 12 in walkway width. Ambulates 20 ft in less than 9 sec but greater than 7 sec.   Ambulating Backwards Walks 20 ft, uses assistive device, slower speed, mild gait deviations, deviates 6-10 in outside 12 in walkway width.   Steps Alternating feet, no rail.   Total Score 28   FGA comment: 28/30= low fall risk            PT Long Term Goals - 11/16/16 0850      PT LONG TERM GOAL #1   Title (TARGET DATE FOR LTG 12/13/16) Pt will be independent with balance and strengthening HEP   Baseline 11/16/16: HEP is going well. Possibly joining gym later after [employer].    Status Achieved     PT LONG TERM GOAL #2   Title Pt will demonstrate improved gait velocity to >4 ft/sec   Baseline 11/16/16: 4.67 ft/sec with no AD   Status Achieved     PT LONG TERM GOAL #3   Title Pt will improve endurance as indicated by increase in 6 MWT distance by 200 feet   Baseline 11/16/16: 1607 today= 485 ft increase from baseline of 1122 ft   Status Achieved     PT LONG TERM GOAL #4   Title Pt will negotiate >12 stairs with  alternating sequence and 2 rails independently   Baseline 11/16/16: not formally tested, pt reports using stairs at home with no issues, performed 4 stairs with no rails reciprocally  in session   Status Achieved     PT LONG TERM GOAL #5  Title Pt will improve BERG balance to 56/56 and FGA to >23/30 for decreased falls risk   Baseline 11/16/16: Merrilee Jansky 56/56 today, FGA 28/30 today   Status Achieved           Plan - 11/16/16 0849    Clinical Impression Statement LTGs checked today at pt request as he wants to "wrap up early if goals are met". All LTGs met today. PT agreeable with early discharge with goals met.    PT Frequency 2x / week   PT Duration 6 weeks   PT Treatment/Interventions ADLs/Self Care Home Management;Gait training;Stair training;Functional mobility training;Therapeutic activities;Therapeutic exercise;Balance training;Neuromuscular re-education;Patient/family education;Energy conservation   PT Next Visit Plan discharge PT today with LTGs met   Consulted and Agree with Plan of Care Patient      Patient will benefit from skilled therapeutic intervention in order to improve the following deficits and impairments:  Cardiopulmonary status limiting activity, Decreased activity tolerance, Decreased balance, Decreased endurance, Decreased strength, Difficulty walking  Visit Diagnosis: Muscle weakness (generalized)  Hemiplegia and hemiparesis following cerebral infarction affecting right dominant side (HCC)  Unsteadiness on feet  Difficulty in walking, not elsewhere classified    PHYSICAL THERAPY DISCHARGE SUMMARY  Visits from Start of Care: 6  Current functional level related to goals / functional outcomes: See above   Remaining deficits: Cardiopulmonary endurance/activity tolerance deficits   Education / Equipment: HEP  Plan: Patient agrees to discharge.  Patient goals were partially met. Patient is being discharged due to meeting the stated rehab goals.  ?????     D/C completed by: Raylene Everts, PT, DPT 11/16/16    1:43 PM     Problem List Patient Active Problem List   Diagnosis Date Noted  . Neurocardiogenic syncope 11/12/2016  . Prediabetes 10/27/2016  . GAD (generalized anxiety disorder) 10/27/2016  . Stenosis of left carotid artery   . Cardiomyopathy ([place])   . CVA (cerebral vascular accident) ([place]) 10/13/2016  . Hypertensive heart disease without CHF 10/13/2016  . HLD (hyperlipidemia) 10/13/2016  . Obesity (BMI 30.0-34.9)     Willow Ora, PTA, [place] [facility] [address] [place], [place] [address] [phone] 11/16/16, 1:25 PM   Name: Jason Melendez MRN: [id number] Date of Birth: 1956-06-17

## 2016-11-16 NOTE — Therapy (Signed)
[facility] [address] [place], [place], [address] Phone: [phone]   Fax:  [phone]  Occupational Therapy Treatment  Patient Details  Name: Jason Melendez MRN: [id number] Date of Birth: 05-Apr-1956 Referring Provider: Dr. Alma Friendly  Encounter Date: 11/16/2016      OT End of Session - 11/16/16 0828    Visit Number 4   Number of Visits 8   Date for OT Re-Evaluation 11/26/16   Authorization Type [place] - Visit Number 4   Authorization - Number of Visits 23   OT Start Time 0801   OT Stop Time 0841   OT Time Calculation (min) 40 min   Activity Tolerance Patient tolerated treatment well      Past Medical History:  Diagnosis Date  . HTN (hypertension)   . Hyperlipidemia     Past Surgical History:  Procedure Laterality Date  . ENDARTERECTOMY Left 10/16/2016   Procedure: ENDARTERECTOMY CAROTID;  Surgeon: Serafina Mitchell, MD;  Location: [place];  Service: Vascular;  Laterality: Left;  . HERNIA REPAIR    . PATCH ANGIOPLASTY Left 10/16/2016   Procedure: PATCH ANGIOPLASTY USING Rueben Bash BIOLOGIC PATCH;  Surgeon: Serafina Mitchell, MD;  Location: [place];  Service: Vascular;  Laterality: Left;    There were no vitals filed for this visit.      Subjective Assessment - 11/16/16 0804    Subjective  Today I return to work - I will be working 30 hours this week.   Pertinent History see epic, L CVA, s/p L endarterectomy   Patient Stated Goals get my R hand stronger so I can go back to work   Currently in Pain? No/denies                      OT Treatments/Exercises (OP) - 11/16/16 0001      ADLs   ADL Comments Reviewed all goals and as well as reviewed stroke information and answered appropriate questions that pt had.  Pt return to work today at reduced work week ( 30 hours a week this week, 64 next week).       Hand Exercises   Other Hand Exercises gripper on #4 for  stacking and unstacking stacks of four blocks - pt completed with no difficulty.  Also upgraded HEP by issuing blue putty for grip strength at home and provided instructions on how to upgrade - see pt instruction for detiails.  Pt verbalized understanding.  Therapeutic activities to address sustained grip and pinch strength.    Other Hand Exercises Arm bike on level 3 for 8 minutes.                 OT Education - 11/16/16 0825    Education provided Yes   Education Details Upgraded HEP for grip strength to blue putty for grip   Person(s) Educated Patient   Methods Explanation;Handout   Comprehension Verbalized understanding;Returned demonstration             OT Long Term Goals - 11/16/16 0826      OT LONG TERM GOAL #1   Title Pt will be mod I with HEP - 11/26/2016   Status Achieved     OT LONG TERM GOAL #2   Title Pt will demonstrate improved sustained grip strength by at least 5 pounds to increase functional use (baseline=60, second trial 40)   Status Achieved     OT LONG TERM  GOAL #3   Title Pt will demonstrate improved pinch strength for ease in using computer and camera by at least 3 pounds for 3 pt and 5 pounds for lateral)   Status Achieved     OT LONG TERM GOAL #4   Title I with HEP for proximal strengthening   Baseline RUE proximal strength grossly 4+/5, distal 5/5- pt reports decreased endurance   Time 4   Period Weeks   Status Achieved     OT LONG TERM GOAL #5   Title Pt will verbalize understanding of signs/symptoms as well as risk factors for CVA   Time 4   Period Weeks   Status Achieved               Plan - 11/16/16 0826    Clinical Impression Statement Pt has met all LTG"s and reports that his activity tolerance has greatly improved. Pt to return to work today. Pt states he is pleased with meeting goals and is anxious to return to work.    Rehab Potential Good   OT Frequency 2x / week   OT Duration 4 weeks   OT Treatment/Interventions  Therapeutic exercise;Neuromuscular education;DME and/or AE instruction;Patient/family education   Plan d/c from OT today as pt has met all goals   Consulted and Agree with Plan of Care Patient      Patient will benefit from skilled therapeutic intervention in order to improve the following deficits and impairments:  Decreased strength, Impaired UE functional use  Visit Diagnosis: Muscle weakness (generalized)  Hemiplegia and hemiparesis following cerebral infarction affecting right dominant side [facility])    Problem List Patient Active Problem List   Diagnosis Date Noted  . Neurocardiogenic syncope 11/12/2016  . Prediabetes 10/27/2016  . GAD (generalized anxiety disorder) 10/27/2016  . Stenosis of left carotid artery   . Cardiomyopathy ([place])   . CVA (cerebral vascular accident) ([place]) 10/13/2016  . Hypertensive heart disease without CHF 10/13/2016  . HLD (hyperlipidemia) 10/13/2016  . Obesity (BMI 30.0-34.9)    OCCUPATIONAL THERAPY DISCHARGE SUMMARY  Visits from Start of Care: 4  Current functional level related to goals / functional outcomes: See above   Remaining deficits: none   Education / Equipment: HEP Plan: Patient agrees to discharge.  Patient goals were met. Patient is being discharged due to meeting the stated rehab goals.  ?????      Quay Burow , OTR/L 11/16/2016, 8:35 AM  [facility] [address] [place], [place], [address] Phone: [phone]   Fax:  [phone]  Name: Jason Melendez MRN: [id number] Date of Birth: 16-Sep-1955

## 2016-11-19 ENCOUNTER — Encounter: Payer: 59 | Admitting: Occupational Therapy

## 2016-11-19 ENCOUNTER — Ambulatory Visit: Payer: 59 | Admitting: Physical Therapy

## 2016-11-24 ENCOUNTER — Encounter: Payer: 59 | Admitting: Occupational Therapy

## 2016-11-24 ENCOUNTER — Ambulatory Visit: Payer: 59 | Admitting: Physical Therapy

## 2016-11-25 ENCOUNTER — Encounter: Payer: 59 | Admitting: Occupational Therapy

## 2016-11-25 ENCOUNTER — Ambulatory Visit: Payer: 59 | Admitting: Physical Therapy

## 2016-11-30 ENCOUNTER — Ambulatory Visit: Payer: 59 | Admitting: Diagnostic Neuroimaging

## 2016-12-01 ENCOUNTER — Ambulatory Visit: Payer: 59 | Admitting: Physical Therapy

## 2016-12-01 ENCOUNTER — Encounter: Payer: 59 | Admitting: Occupational Therapy

## 2016-12-01 ENCOUNTER — Encounter: Payer: Self-pay | Admitting: Primary Care

## 2016-12-01 ENCOUNTER — Ambulatory Visit (INDEPENDENT_AMBULATORY_CARE_PROVIDER_SITE_OTHER): Payer: 59 | Admitting: Primary Care

## 2016-12-01 VITALS — BP 136/78 | HR 78 | Temp 98.2°F | Ht 72.0 in | Wt 252.0 lb

## 2016-12-01 DIAGNOSIS — I6300 Cerebral infarction due to thrombosis of unspecified precerebral artery: Secondary | ICD-10-CM | POA: Diagnosis not present

## 2016-12-01 DIAGNOSIS — F411 Generalized anxiety disorder: Secondary | ICD-10-CM | POA: Diagnosis not present

## 2016-12-01 DIAGNOSIS — E785 Hyperlipidemia, unspecified: Secondary | ICD-10-CM

## 2016-12-01 MED ORDER — ATORVASTATIN CALCIUM 40 MG PO TABS: 40.0000 mg | ORAL_TABLET | Freq: Every day | ORAL | 2 refills | Status: DC

## 2016-12-01 MED ORDER — ESCITALOPRAM OXALATE 10 MG PO TABS: 10.0000 mg | ORAL_TABLET | Freq: Every day | ORAL | 2 refills | Status: DC

## 2016-12-01 MED ORDER — ATORVASTATIN CALCIUM 40 MG PO TABS: 40.0000 mg | ORAL_TABLET | Freq: Every day | ORAL | 1 refills | Status: DC

## 2016-12-01 NOTE — Assessment & Plan Note (Signed)
Much improved since last visit. Continue Lexapro 10 mg. Denies SI/HI.

## 2016-12-01 NOTE — Assessment & Plan Note (Signed)
Refilled atorvastatin. Due for lipid recheck in 1 month.

## 2016-12-01 NOTE — Patient Instructions (Signed)
Schedule your physical as discussed. Come fasting as we will complete lab work.  Continue Lexapro 10 mg tablets for anxiety.  It was a pleasure to see you today!

## 2016-12-01 NOTE — Progress Notes (Signed)
Subjective:    Patient ID: Jason Melendez, male    DOB: 10/31/1955, 61 y.o.   MRN: [id number]  HPI  [name] is a 61 year old male who presents today for follow up of anxiety disorder. He was evaluated as a new patient in late February 2018 with complaints of anxiety after he recently suffered from CVA. His GAD 7 score that day was 15. He did endorse a prior history of GAD and was once managed on medication in the past with success. He was initiated on Lexapro during his last visit and was advised to establish with therapy. He later declined therapy referral as he felt it wasn't needed.  Since his last visit he's feeling much improved. Positive effects of the medication include feeling less irritable, less anxious, and less stressed at work. He's able to handle things at work that once caused tremendous stress. He denies SI/HI, headaches, nausea.   Review of Systems  Eyes: Negative for visual disturbance.  Respiratory: Negative for shortness of breath.   Cardiovascular: Negative for chest pain.  Neurological: Negative for headaches.  Psychiatric/Behavioral: Negative for sleep disturbance and suicidal ideas. The patient is not nervous/anxious.        Past Medical History:  Diagnosis Date  . HTN (hypertension)   . Hyperlipidemia      Social History   Social History  . Marital status: Married    Spouse name: N/A  . Number of children: N/A  . Years of education: N/A   Occupational History  . Not on file.   Social History Main Topics  . Smoking status: Never Smoker  . Smokeless tobacco: Never Used  . Alcohol use No  . Drug use: No  . Sexual activity: Not Currently   Other Topics Concern  . Not on file   Social History Narrative   Marital status: married      Children: 2 daughters      Lives:  With wife      Employment: printing work      Tobacco: none      Alcohol: one beer every three months      Drugs:  None      Exercise:  No formal exercise;  walks up three flights of stairs several times per day at work.    Past Surgical History:  Procedure Laterality Date  . ENDARTERECTOMY Left 10/16/2016   Procedure: ENDARTERECTOMY CAROTID;  Surgeon: Nada Libman, MD;  Location: [facility] OR;  Service: Vascular;  Laterality: Left;  . HERNIA REPAIR    . PATCH ANGIOPLASTY Left 10/16/2016   Procedure: PATCH ANGIOPLASTY USING Livia Snellen BIOLOGIC PATCH;  Surgeon: Nada Libman, MD;  Location: [facility] OR;  Service: Vascular;  Laterality: Left;    Family History  Problem Relation Age of Onset  . Diabetes Mother   . Heart disease Father 49    CABG  . Diabetes Father   . Kidney disease Father   . Kidney failure Father   . Hypertension Sister     Allergies  Allergen Reactions  . Hydralazine Hcl     Sudden drop in BP    Current Outpatient Prescriptions on File Prior to Visit  Medication Sig Dispense Refill  . aspirin 325 MG tablet Take 1 tablet (325 mg total) by mouth daily. 60 tablet 0  . carvedilol (COREG) 6.25 MG tablet Take 1 tablet (6.25 mg total) by mouth 2 (two) times daily with a meal. 180 tablet 3  . losartan (COZAAR)  50 MG tablet Take 1 tablet (50 mg total) by mouth daily. 90 tablet 3   No current facility-administered medications on file prior to visit.     BP 136/78   Pulse 78   Temp 98.2 F (36.8 C) (Oral)   Ht 6' (1.829 m)   Wt 252 lb (114.3 kg)   SpO2 96%   BMI 34.18 kg/m    Objective:   Physical Exam  Constitutional: He appears well-nourished.  Neck: Neck supple.  Cardiovascular: Normal rate and regular rhythm.   Pulmonary/Chest: Effort normal and breath sounds normal.  Skin: Skin is warm and dry.  Psychiatric: He has a normal mood and affect.          Assessment & Plan:

## 2016-12-01 NOTE — Progress Notes (Signed)
Pre visit review using our clinic review tool, if applicable. No additional management support is needed unless otherwise documented below in the visit note. 

## 2016-12-01 NOTE — Assessment & Plan Note (Signed)
Recovering well, completed PT/OT and is working full time. Follow up due with neurology later this week.

## 2016-12-03 ENCOUNTER — Ambulatory Visit: Payer: 59

## 2016-12-03 ENCOUNTER — Encounter: Payer: 59 | Admitting: Occupational Therapy

## 2016-12-03 ENCOUNTER — Ambulatory Visit: Payer: 59 | Admitting: Diagnostic Neuroimaging

## 2016-12-04 ENCOUNTER — Encounter: Payer: Self-pay | Admitting: Diagnostic Neuroimaging

## 2016-12-04 ENCOUNTER — Ambulatory Visit (INDEPENDENT_AMBULATORY_CARE_PROVIDER_SITE_OTHER): Payer: 59 | Admitting: Diagnostic Neuroimaging

## 2016-12-04 VITALS — BP 142/82 | HR 63 | Ht 73.0 in | Wt 252.2 lb

## 2016-12-04 DIAGNOSIS — R55 Syncope and collapse: Secondary | ICD-10-CM

## 2016-12-04 DIAGNOSIS — I10 Essential (primary) hypertension: Secondary | ICD-10-CM

## 2016-12-04 DIAGNOSIS — R7303 Prediabetes: Secondary | ICD-10-CM

## 2016-12-04 DIAGNOSIS — I6930 Unspecified sequelae of cerebral infarction: Secondary | ICD-10-CM

## 2016-12-04 DIAGNOSIS — I429 Cardiomyopathy, unspecified: Secondary | ICD-10-CM

## 2016-12-04 DIAGNOSIS — I63232 Cerebral infarction due to unspecified occlusion or stenosis of left carotid arteries: Secondary | ICD-10-CM | POA: Diagnosis not present

## 2016-12-04 DIAGNOSIS — E7849 Other hyperlipidemia: Secondary | ICD-10-CM

## 2016-12-04 DIAGNOSIS — E784 Other hyperlipidemia: Secondary | ICD-10-CM

## 2016-12-04 NOTE — Patient Instructions (Addendum)
Thank you for coming to see [place] at [facility]. I hope we have been able to provide you high quality care today.  You may receive a patient satisfaction survey over the next few weeks. We would appreciate your feedback and comments so that we may continue to improve ourselves and the health of our patients.   - I will setup cardiac monitor testing (30 days)  - I will setup sleep study consult  - gradually increase physical activity; discuss with cardiology (Dr. Tamala Julian) about exercise tolerance   ~~~~~~~~~~~~~~~~~~~~~~~~~~~~~~~~~~~~~~~~~~~~~~~~~~~~~~~~~~~~~~~~~  DR. Jessamyn Watterson'S GUIDE TO HAPPY AND HEALTHY LIVING These are some of my general health and wellness recommendations. Some of them may apply to you better than others. Please use common sense as you try these suggestions and feel free to ask me any questions.   ACTIVITY/FITNESS Mental, social, emotional and physical stimulation are very important for brain and body health. Try learning a new activity (arts, music, language, sports, games).  Keep moving your body to the best of your abilities. You can do this at home, inside or outside, the park, community center, gym or anywhere you like. Consider a physical therapist or personal trainer to get started. Consider the app Sworkit. Fitness trackers such as smart-watches, smart-phones or Fitbits can help as well.   NUTRITION Eat more plants: colorful vegetables, nuts, seeds and berries.  Eat less sugar, salt, preservatives and processed foods.  Avoid toxins such as cigarettes and alcohol.  Drink water when you are thirsty. Warm water with a slice of lemon is an excellent morning drink to start the day.  Consider these websites for more information The Nutrition Source (https://www.henry-hernandez.biz/) Precision Nutrition (WindowBlog.ch)   RELAXATION Consider practicing mindfulness meditation or other relaxation  techniques such as deep breathing, prayer, yoga, tai chi, massage. See website mindful.org or the apps Headspace or Calm to help get started.   SLEEP Try to get at least 7-8+ hours sleep per day. Regular exercise and reduced caffeine will help you sleep better. Practice good sleep hygeine techniques. See website sleep.org for more information.   PLANNING Prepare estate planning, living will, healthcare POA documents. Sometimes this is best planned with the help of an attorney. Theconversationproject.org and agingwithdignity.org are excellent resources.

## 2016-12-04 NOTE — Progress Notes (Signed)
[facility]  PATIENT: Jason Melendez DOB: 23-Apr-1956  REFERRING CLINICIAN: Roda Shutters / Elgergawy HISTORY FROM: patient  REASON FOR VISIT: new consult    HISTORICAL  CHIEF COMPLAINT:  Chief Complaint  Patient presents with  . Hospitalization Follow-up  . Cerebrovascular Accident    HISTORY OF PRESENT ILLNESS:   61 year old male here for evaluation of left anterior cerebral artery ischemic infarction, status post left carotid endarterectomy in February 2018. History of hypertension and hypercholesterolemia.  Patient had onset of right leg tingling sensation in right leg weakness. Patient was having trouble using his right hand on the computer mouse. After 1-2 days he went to his urgent care, had evaluation, was found to have systolic blood pressure of 229. Patient was having some trouble speaking as well. Therefore patient was sent to the emergency room for evaluation. On 10/12/16 he presented to the emergency room, had elevated blood pressure of 220/110, was given IV hydralazine to lower pressure but developed hypotension and bradycardia, had worsening right-sided weakness and almost passed out.  Patient was diagnosed with left ACA stroke and left internal carotid artery stenosis. This was treated with left carotid endarterectomy with bovine patch on 10/16/16. Patient was discharged on aspirin, blood pressure medication, statin.  Since that time patient is doing well. He has no residual symptoms. Occasionally when he sits for a long time on his work chair he feels some numbness in his right leg. If he stands up, moves his leg and numbness resolves.  Patient has improved his nutrition and is gradually trying to increase physical activity.   REVIEW OF SYSTEMS: Full 14 system review of systems performed and negative with exception of: Only as per history of present illness.    ALLERGIES: Allergies  Allergen Reactions  . Hydralazine Hcl     Sudden drop in BP     HOME MEDICATIONS: Outpatient Medications Prior to Visit  Medication Sig Dispense Refill  . aspirin 325 MG tablet Take 1 tablet (325 mg total) by mouth daily. 60 tablet 0  . atorvastatin (LIPITOR) 40 MG tablet Take 1 tablet (40 mg total) by mouth daily at 6 PM. 90 tablet 2  . carvedilol (COREG) 6.25 MG tablet Take 1 tablet (6.25 mg total) by mouth 2 (two) times daily with a meal. 180 tablet 3  . escitalopram (LEXAPRO) 10 MG tablet Take 1 tablet (10 mg total) by mouth daily. 90 tablet 2  . losartan (COZAAR) 50 MG tablet Take 1 tablet (50 mg total) by mouth daily. 90 tablet 3   No facility-administered medications prior to visit.     PAST MEDICAL HISTORY: Past Medical History:  Diagnosis Date  . HTN (hypertension)   . Hyperlipidemia     PAST SURGICAL HISTORY: Past Surgical History:  Procedure Laterality Date  . ENDARTERECTOMY Left 10/16/2016   Procedure: ENDARTERECTOMY CAROTID;  Surgeon: Nada Libman, MD;  Location: [facility] OR;  Service: Vascular;  Laterality: Left;  . HERNIA REPAIR    . PATCH ANGIOPLASTY Left 10/16/2016   Procedure: PATCH ANGIOPLASTY USING Livia Snellen BIOLOGIC PATCH;  Surgeon: Nada Libman, MD;  Location: [facility] OR;  Service: Vascular;  Laterality: Left;    FAMILY HISTORY: Family History  Problem Relation Age of Onset  . Diabetes Mother   . Heart disease Father 72    CABG  . Diabetes Father   . Kidney disease Father   . Kidney failure Father   . Hypertension Sister     SOCIAL HISTORY:  Social History  Social History  . Marital status: Married    Spouse name: N/A  . Number of children: N/A  . Years of education: N/A   Occupational History  . Not on file.   Social History Main Topics  . Smoking status: Never Smoker  . Smokeless tobacco: Never Used  . Alcohol use No  . Drug use: No  . Sexual activity: Not Currently   Other Topics Concern  . Not on file   Social History Narrative   Marital status: married      Children: 2 daughters       Lives:  With wife      Employment: printing work      Tobacco: none      Alcohol: one beer every three months      Drugs:  None      Exercise:  No formal exercise; walks up three flights of stairs several times per day at work.   Lives at home.  Works for [employer].     2 yrs college.       PHYSICAL EXAM  GENERAL EXAM/CONSTITUTIONAL: Vitals:  Vitals:   12/04/16 0812  BP: (!) 142/82  Pulse: 63  Weight: 252 lb 3.2 oz (114.4 kg)  Height:  (1.854 m)     Body mass index is 33.27 kg/m.  Visual Acuity Screening   Right eye Left eye Both eyes  Without correction:     With correction: 20/30 20/20      Patient is in no distress; well developed, nourished and groomed; neck is supple  CARDIOVASCULAR:  Examination of carotid arteries is normal; no carotid bruits  LEFT CEA SCAR  Regular rate and rhythm, no murmurs  Examination of peripheral vascular system by observation and palpation is normal  EYES:  Ophthalmoscopic exam of optic discs and posterior segments is normal; no papilledema or hemorrhages  MUSCULOSKELETAL:  Gait, strength, tone, movements noted in Neurologic exam below  NEUROLOGIC: MENTAL STATUS:  No flowsheet data found.  awake, alert, oriented to person, place and time  recent and remote memory intact  normal attention and concentration  language fluent, comprehension intact, naming intact   fund of knowledge appropriate  CRANIAL NERVE:   2nd - no papilledema on fundoscopic exam  2nd, 3rd, 4th, 6th - pupils equal and reactive to light, visual fields full to confrontation, extraocular muscles intact, no nystagmus  5th - facial sensation --> DECR IN LEFT LOWER FACE / NECK  7th - facial strength symmetric  8th - hearing intact  9th - palate elevates symmetrically, uvula midline  11th - shoulder shrug symmetric  12th - tongue protrusion midline  MOTOR:   normal bulk and tone, full strength in the BUE,  BLE  SENSORY:   normal and symmetric to light touch, temperature, vibration  COORDINATION:   finger-nose-finger, fine finger movements normal  REFLEXES:   deep tendon reflexes present and symmetric  GAIT/STATION:   narrow based gait; able to walk on toes, heels and tandem; romberg is negative    DIAGNOSTIC DATA (LABS, IMAGING, TESTING) - I reviewed patient records, labs, notes, testing and imaging myself where available.  Lab Results  Component Value Date   WBC 8.4 10/27/2016   HGB 14.8 10/27/2016   HCT 44.0 10/27/2016   MCV 92.2 10/27/2016   PLT 218.0 10/27/2016      Component Value Date/Time   NA 140 10/27/2016 1624   K 5.0 10/27/2016 1624   CL 103 10/27/2016 1624   CO2  29 10/27/2016 1624   GLUCOSE 115 (H) 10/27/2016 1624   BUN 21 10/27/2016 1624   CREATININE 1.17 10/27/2016 1624   CREATININE 0.83 10/29/2015 1719   CALCIUM 10.1 10/27/2016 1624   PROT 6.0 (L) 10/12/2016 2322   ALBUMIN 3.5 10/12/2016 2322   AST 34 10/12/2016 2322   ALT 18 10/12/2016 2322   ALKPHOS 34 (L) 10/12/2016 2322   BILITOT 0.9 10/12/2016 2322   GFRNONAA >60 10/17/2016 0314   GFRAA >60 10/17/2016 0314   Lab Results  Component Value Date   CHOL 176 10/13/2016   HDL 26 (L) 10/13/2016   LDLCALC 109 (H) 10/13/2016   TRIG 206 (H) 10/13/2016   CHOLHDL 6.8 10/13/2016   Lab Results  Component Value Date   HGBA1C 6.4 (H) 10/13/2016   No results found for: VITAMINB12 Lab Results  Component Value Date   TSH 1.460 07/21/2015   [I reviewed CTA and MRI images myself and agree with interpretation. -VRP]    MRI  left frontoparietal/ACA territory infarcts.  MRA head  occluded colossal marginal branch of the azygos ACA  MRA neck  50% stenosis B ICAs   CTA neck 50% left ICA/CCA stenosis with calcification and soft plaque  2D Echo  EF 40-45% with diffuse hypokinesis  Lower extremity Dopplers no DVT    ASSESSMENT AND PLAN  61 y.o. year old male here with history of hypertension  presenting with right-sided weakness  3 days. He did not receive IV t-PA due to delay in arrival.   Dx:  1. Chronic ischemic left ACA stroke   2. 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 Accelerated hypertension   4. Prediabetes   5. Other hyperlipidemia   6. Neurocardiogenic syncope   7. Cardiomyopathy, unspecified type (HCC)      PLAN:  STROKE  - Left ACA territory infarcts embolic secondary to left ICA/CCA soft plaque and calcification with 50% stenosis; cardioembolic source possible but less likely - sleep study (hypertension, stroke, snoring, BMI 33) - continue aspirin  daily, statin, BP control - nutrition and exercise strategies reviewed  CARDIOMYOPATHY - EF 40-45% with diffuse hypokinesis - Setup 30 day cardiac event monitoring  HYPERTENSION - BP goal --> normotensive  HYPERLIPIDEMIA - Continue statin   Orders Placed This Encounter  Procedures  . Ambulatory referral to Sleep Studies  . Cardiac event monitor   Return in about 3 months (around 03/05/2017).  I reviewed images, labs, notes, records myself. I summarized findings and reviewed with patient, for this high risk condition (stroke) requiring high complexity decision making.     Suanne Marker, MD 12/04/2016, 8:43 AM Certified in Neurology, Neurophysiology and Neuroimaging  [facility] [address], [address] [place], [place] [address] [phone]

## 2016-12-08 ENCOUNTER — Ambulatory Visit: Payer: 59 | Admitting: Physical Therapy

## 2016-12-08 ENCOUNTER — Encounter: Payer: 59 | Admitting: Occupational Therapy

## 2016-12-10 ENCOUNTER — Ambulatory Visit: Payer: 59 | Admitting: Physical Therapy

## 2016-12-10 ENCOUNTER — Encounter: Payer: 59 | Admitting: Occupational Therapy

## 2017-01-04 ENCOUNTER — Encounter: Payer: Self-pay | Admitting: Primary Care

## 2017-01-04 NOTE — Telephone Encounter (Signed)
Please see my chart message, can you help?

## 2017-01-11 ENCOUNTER — Other Ambulatory Visit: Payer: Self-pay | Admitting: Diagnostic Neuroimaging

## 2017-01-11 ENCOUNTER — Ambulatory Visit (INDEPENDENT_AMBULATORY_CARE_PROVIDER_SITE_OTHER): Payer: 59

## 2017-01-11 DIAGNOSIS — I4891 Unspecified atrial fibrillation: Secondary | ICD-10-CM

## 2017-01-11 DIAGNOSIS — I6930 Unspecified sequelae of cerebral infarction: Secondary | ICD-10-CM | POA: Diagnosis not present

## 2017-01-11 DIAGNOSIS — I429 Cardiomyopathy, unspecified: Secondary | ICD-10-CM | POA: Diagnosis not present

## 2017-01-11 DIAGNOSIS — I10 Essential (primary) hypertension: Secondary | ICD-10-CM

## 2017-01-22 ENCOUNTER — Encounter: Payer: Self-pay | Admitting: Primary Care

## 2017-01-22 ENCOUNTER — Ambulatory Visit (INDEPENDENT_AMBULATORY_CARE_PROVIDER_SITE_OTHER): Payer: 59 | Admitting: Primary Care

## 2017-01-22 VITALS — BP 136/86 | HR 65 | Temp 98.2°F | Resp 20 | Ht 72.0 in | Wt 242.1 lb

## 2017-01-22 DIAGNOSIS — Z0000 Encounter for general adult medical examination without abnormal findings: Secondary | ICD-10-CM | POA: Diagnosis not present

## 2017-01-22 DIAGNOSIS — Z125 Encounter for screening for malignant neoplasm of prostate: Secondary | ICD-10-CM

## 2017-01-22 DIAGNOSIS — I119 Hypertensive heart disease without heart failure: Secondary | ICD-10-CM

## 2017-01-22 DIAGNOSIS — I6300 Cerebral infarction due to thrombosis of unspecified precerebral artery: Secondary | ICD-10-CM

## 2017-01-22 DIAGNOSIS — R7303 Prediabetes: Secondary | ICD-10-CM

## 2017-01-22 DIAGNOSIS — F411 Generalized anxiety disorder: Secondary | ICD-10-CM

## 2017-01-22 DIAGNOSIS — Z1211 Encounter for screening for malignant neoplasm of colon: Secondary | ICD-10-CM | POA: Diagnosis not present

## 2017-01-22 DIAGNOSIS — E785 Hyperlipidemia, unspecified: Secondary | ICD-10-CM

## 2017-01-22 LAB — COMPREHENSIVE METABOLIC PANEL
ALT: 11 U/L (ref 0–53)
AST: 17 U/L (ref 0–37)
Albumin: 4.3 g/dL (ref 3.5–5.2)
Alkaline Phosphatase: 47 U/L (ref 39–117)
BUN: 16 mg/dL (ref 6–23)
CO2: 31 mEq/L (ref 19–32)
Calcium: 9.5 mg/dL (ref 8.4–10.5)
Chloride: 105 mEq/L (ref 96–112)
Creatinine, Ser: 1.09 mg/dL (ref 0.40–1.50)
GFR: 73.24 mL/min (ref >60.00)
Glucose, Bld: 136 mg/dL — ABNORMAL HIGH (ref 70–99)
Potassium: 4.7 mEq/L (ref 3.5–5.1)
Sodium: 143 mEq/L (ref 135–145)
Total Bilirubin: 1.1 mg/dL (ref 0.2–1.2)
Total Protein: 6.5 g/dL (ref 6.0–8.3)

## 2017-01-22 LAB — LIPID PANEL
Cholesterol: 101 mg/dL (ref 0–200)
HDL: 24.7 mg/dL — ABNORMAL LOW (ref >39.00)
LDL Cholesterol: 56 mg/dL (ref 0–99)
NonHDL: 75.84
Total CHOL/HDL Ratio: 4
Triglycerides: 99 mg/dL (ref 0.0–149.0)
VLDL: 19.8 mg/dL (ref 0.0–40.0)

## 2017-01-22 LAB — HEMOGLOBIN A1C: Hgb A1c MFr Bld: 6.1 % (ref 4.6–6.5)

## 2017-01-22 LAB — PSA: PSA: 1.16 ng/mL (ref 0.10–4.00)

## 2017-01-22 NOTE — Assessment & Plan Note (Signed)
Due for repeat lipids today, continue statin. 

## 2017-01-22 NOTE — Patient Instructions (Signed)
Complete lab work prior to leaving today. I will notify you of your results once received. We will fax your form once we receive the lab results and the fax number.  You will be contacted regarding your referral to GI for the colonoscopy.  Please let us know if you have not heard back within one week.   Continue working on a healthy diet.   Start exercising. You should be getting 150 minutes of moderate intensity exercise weekly. Work up to this slowly.  Ensure you are consuming 64 ounces of water daily.  Follow up in 1 year for your annual exam or sooner if needed.  It was a pleasure to see you today!

## 2017-01-22 NOTE — Assessment & Plan Note (Signed)
Doing well since CVA in February. Neuro exam unremarkable, good strength to upper and lower extremities bilaterally.

## 2017-01-22 NOTE — Assessment & Plan Note (Signed)
Doing well on Lexapro. Denies Si/HI. Continue to monitor.

## 2017-01-22 NOTE — Progress Notes (Signed)
Subjective:    Patient ID: Jason Melendez, male    DOB: 1956-05-12, 61 y.o.   MRN: [id number]  HPI  [name] is a 61 year old male who presents today for complete physical.  Immunizations: -Tetanus: Completed in 2016 -Influenza: Did complete last season -Shingles: Declines  Diet: He endorses a healthy diet Breakfast: Cereal, eggs Lunch: Sandwich, baked chips Dinner: Meat, vegetable, starch Snacks: Fruit Desserts: Rarely Beverages: Flavored water, occasional soda, water  Exercise: He is currently not exercising, is active since CVA Eye exam: Completed in 2017 Dental exam: Has not completed recently Colonoscopy: Completed in 2007. Due.  PSA: Due.   Review of Systems  Constitutional: Negative for unexpected weight change.  HENT: Negative for rhinorrhea.   Respiratory: Negative for cough and shortness of breath.   Cardiovascular: Negative for chest pain.  Gastrointestinal: Negative for constipation and diarrhea.  Genitourinary: Negative for difficulty urinating.  Musculoskeletal: Positive for arthralgias. Negative for myalgias.  Skin: Negative for rash.  Allergic/Immunologic: Negative for environmental allergies.  Neurological: Negative for dizziness, weakness, numbness and headaches.  Psychiatric/Behavioral:       Doing well on Lexapro       Past Medical History:  Diagnosis Date  . HTN (hypertension)   . Hyperlipidemia      Social History   Social History  . Marital status: Married    Spouse name: N/A  . Number of children: N/A  . Years of education: N/A   Occupational History  . Not on file.   Social History Main Topics  . Smoking status: Never Smoker  . Smokeless tobacco: Never Used  . Alcohol use No  . Drug use: No  . Sexual activity: Not Currently   Other Topics Concern  . Not on file   Social History Narrative   Marital status: married      Children: 2 daughters      Lives:  With wife      Employment: printing work   Tobacco: none      Alcohol: one beer every three months      Drugs:  None      Exercise:  No formal exercise; walks up three flights of stairs several times per day at work.   Lives at home.  Works for [employer].     2 yrs college.      Past Surgical History:  Procedure Laterality Date  . ENDARTERECTOMY Left 10/16/2016   Procedure: ENDARTERECTOMY CAROTID;  Surgeon: Nada LibmanVance W Brabham, MD;  [facility];  Service: Vascular;  Laterality: Left;  . HERNIA REPAIR    . PATCH ANGIOPLASTY Left 10/16/2016   Procedure: PATCH ANGIOPLASTY USING Livia SnellenXENOSURE BIOLOGIC PATCH;  Surgeon: Nada LibmanVance W Brabham, MD;  [facility];  Service: Vascular;  Laterality: Left;    Family History  Problem Relation Age of Onset  . Diabetes Mother   . Heart disease Father [id number]       CABG  . Diabetes Father   . Kidney disease Father   . Kidney failure Father   . Hypertension Sister     Allergies  Allergen Reactions  . Hydralazine Hcl     Sudden drop in BP    Current Outpatient Prescriptions on File Prior to Visit  Medication Sig Dispense Refill  . aspirin 325 MG tablet Take 1 tablet (325 mg total) by mouth daily. 60 tablet 0  . atorvastatin (LIPITOR) 40 MG tablet Take 1 tablet (40 mg total) by mouth daily at  6 PM. 90 tablet 2  . carvedilol (COREG) 6.25 MG tablet Take 1 tablet (6.25 mg total) by mouth 2 (two) times daily with a meal. 180 tablet 3  . escitalopram (LEXAPRO) 10 MG tablet Take 1 tablet (10 mg total) by mouth daily. 90 tablet 2  . losartan (COZAAR) 50 MG tablet Take 1 tablet (50 mg total) by mouth daily. 90 tablet 3   No current facility-administered medications on file prior to visit.     BP 136/86   Pulse 65   Temp 98.2 F (36.8 C) (Oral)   Resp 20   Ht 6' (1.829 m)   Wt 242 lb 1.9 oz (109.8 kg)   SpO2 98%   BMI 32.84 kg/m    Objective:   Physical Exam  Constitutional: He is oriented to person, place, and time. He appears well-nourished.  HENT:  Right Ear: Tympanic  membrane and ear canal normal.  Left Ear: Tympanic membrane and ear canal normal.  Nose: Nose normal. Right sinus exhibits no maxillary sinus tenderness and no frontal sinus tenderness. Left sinus exhibits no maxillary sinus tenderness and no frontal sinus tenderness.  Mouth/Throat: Oropharynx is clear and moist.  Eyes: Conjunctivae and EOM are normal. Pupils are equal, round, and reactive to light.  Neck: Neck supple. Carotid bruit is not present. No thyromegaly present.  Cardiovascular: Normal rate, regular rhythm and normal heart sounds.   Pulmonary/Chest: Effort normal and breath sounds normal. He has no wheezes. He has no rales.  Abdominal: Soft. Bowel sounds are normal. There is no tenderness.  Musculoskeletal: Normal range of motion.  Neurological: He is alert and oriented to person, place, and time. He has normal reflexes. No cranial nerve deficit.  Skin: Skin is warm and dry.  Surgical scar to left carotid artery from surgery. Healed well.  Psychiatric: He has a normal mood and affect.          Assessment & Plan:

## 2017-01-22 NOTE — Assessment & Plan Note (Signed)
Immunizations UTD. Declines Shingles vaccination. PSA due, pending. Colonoscopy due, pending. Discussed the importance of a healthy diet and regular exercise in order for weight loss, and to reduce the risk of other medical diseases. Exam unremarkable. Labs pending. Follow up in 1 year.

## 2017-01-22 NOTE — Assessment & Plan Note (Signed)
BP stable in the office today. Continue to monitor.

## 2017-01-22 NOTE — Assessment & Plan Note (Signed)
Due for repeat A1C today. Discussed to work on diet and start exercising.

## 2017-01-29 NOTE — Addendum Note (Signed)
Addendum  created 01/29/17 1135 by Sharee HolsterMassagee, Ercole Georg, MD   Sign clinical note

## 2017-02-23 ENCOUNTER — Telehealth: Payer: Self-pay | Admitting: *Deleted

## 2017-02-23 ENCOUNTER — Encounter: Payer: Self-pay | Admitting: Primary Care

## 2017-02-23 NOTE — Telephone Encounter (Signed)
error 

## 2017-02-23 NOTE — Telephone Encounter (Signed)
Returned patient's call and informed him his cardiac event monitor results were unremarkable. Advised he was to have a sleep study. Patient stated he didn't think he needed one. He stated his family tells him he doesn't snore or have sleep apnea.  Patient stated "I am doing fine except for my legs which I had problems with before."  Reminded him of his FU next month and advised he call sooner for any problems, needs. Patient verbalized understanding, appreciation.

## 2017-02-23 NOTE — Telephone Encounter (Signed)
Patient called office returning RN's call.  Please call and leave a voicemail per patient due to not being able to answer phone at work.

## 2017-02-23 NOTE — Telephone Encounter (Signed)
LVM requesting call back for cardiac event monitor results.

## 2017-02-24 NOTE — Progress Notes (Signed)
Cardiology Office Note    Date:  02/25/2017   ID:  Jason Melendez, Jason Melendez [birth year], MRN [id number]  PCP:  Doreene Nest, NP  Cardiologist: Lesleigh Noe, MD   Chief Complaint  Patient presents with  . Congestive Heart Failure  . Hypertension    History of Present Illness:  Stpehen Petitjean is a 61 y.o. male with recent left brain stroke, high-grade left carotid stenosis treated with surgery by Dr. Myra Gianotti, asymptomatic systolic heart failure, hypertension, hyperlipidemia, and recurrent episodes of syncope (vasovagal).  No cardiac complaints. No recurrent syncope. No medication side effects. Back at work. Uses Tylenol arthritis for bilateral knee discomfort with dramatic improvement in mobility. Denies chest pain. No dyspnea or PND.  Past Medical History:  Diagnosis Date  . HTN (hypertension)   . Hyperlipidemia     Past Surgical History:  Procedure Laterality Date  . ENDARTERECTOMY Left 10/16/2016   Procedure: ENDARTERECTOMY CAROTID;  Surgeon: Nada Libman, MD;  Location: [facility] OR;  Service: Vascular;  Laterality: Left;  . HERNIA REPAIR    . PATCH ANGIOPLASTY Left 10/16/2016   Procedure: PATCH ANGIOPLASTY USING Livia Snellen BIOLOGIC PATCH;  Surgeon: Nada Libman, MD;  Location: [facility] OR;  Service: Vascular;  Laterality: Left;    Current Medications: Outpatient Medications Prior to Visit  Medication Sig Dispense Refill  . aspirin 325 MG tablet Take 1 tablet (325 mg total) by mouth daily. 60 tablet 0  . atorvastatin (LIPITOR) 40 MG tablet Take 1 tablet (40 mg total) by mouth daily at 6 PM. 90 tablet 2  . carvedilol (COREG) 6.25 MG tablet Take 1 tablet (6.25 mg total) by mouth 2 (two) times daily with a meal. 180 tablet 3  . escitalopram (LEXAPRO) 10 MG tablet Take 1 tablet (10 mg total) by mouth daily. 90 tablet 2  . losartan (COZAAR) 50 MG tablet Take 1 tablet (50 mg total) by mouth daily. 90 tablet 3   No facility-administered medications prior to visit.        Allergies:   Hydralazine hcl   Social History   Social History  . Marital status: Married    Spouse name: N/A  . Number of children: N/A  . Years of education: N/A   Social History Main Topics  . Smoking status: Never Smoker  . Smokeless tobacco: Never Used  . Alcohol use No  . Drug use: No  . Sexual activity: Not Currently   Other Topics Concern  . None   Social History Narrative   Marital status: married      Children: 2 daughters      Lives:  With wife      Employment: printing work      Tobacco: none      Alcohol: one beer every three months      Drugs:  None      Exercise:  No formal exercise; walks up three flights of stairs several times per day at work.   Lives at home.  Works for [employer].     2 yrs college.       Family History:  The patient's family history includes Diabetes in his father and mother; Heart disease (age of onset: 82) in his father; Hypertension in his sister; Kidney disease in his father; Kidney failure in his father.   ROS:   Please see the history of present illness.    Bilateral knee pain, occasional right leg weakness, recurrent fainting episodes. Otherwise no  complaints.  All other systems reviewed and are negative.   PHYSICAL EXAM:   VS:  BP (!) 158/76 (BP Location: Left Arm)   Pulse 78   Ht 6\' 1"  (1.854 m)   Wt 250 lb (113.4 kg)   BMI 32.98 kg/m    GEN: Well nourished, well developed, in no acute distress  HEENT: normal  Neck: no JVD, carotid bruits, or masses Cardiac: RRR; no murmurs, rubs, or gallops,no edema  Respiratory:  clear to auscultation bilaterally, normal work of breathing GI: soft, nontender, nondistended, + BS MS: no deformity or atrophy  Skin: warm and dry, no rash Neuro:  Alert and Oriented x 3, Strength and sensation are intact Psych: euthymic mood, full affect  Wt Readings from Last 3 Encounters:  02/25/17 250 lb (113.4 kg)  01/22/17 242 lb 1.9 oz (109.8 kg)  12/04/16 252 lb 3.2 oz  (114.4 kg)      Studies/Labs Reviewed:   EKG:  EKG  Not repeated  Recent Labs: 10/12/2016: B Natriuretic Peptide 27.0 10/27/2016: Hemoglobin 14.8; Platelets 218.0 01/22/2017: ALT 11; BUN 16; Creatinine, Ser 1.09; Potassium 4.7; Sodium 143   Lipid Panel    Component Value Date/Time   CHOL 101 01/22/2017 0755   TRIG 99.0 01/22/2017 0755   HDL 24.70 (L) 01/22/2017 0755   CHOLHDL 4 01/22/2017 0755   VLDL 19.8 01/22/2017 0755   LDLCALC 56 01/22/2017 0755    Additional studies/ records that were reviewed today include:  None    ASSESSMENT:    1. Hypertensive heart disease without CHF   2. Cerebrovascular accident (CVA) due to thrombosis of precerebral artery (HCC)   3. Cardiomyopathy, unspecified type (HCC)   4. Other hyperlipidemia   5. Neurocardiogenic syncope      PLAN:  In order of problems listed above:  1. BP initially greater than 150 mmHg systolic. Repeat 148/80 mmHg. Decrease on the diet. Continue weight loss. Progress aerobic activity. Encouraged patient to monitor his blood pressures at home and to share results with us. If consistently greater than 140 mmHg systolic to 90 mmHg diastolic we will need to further increase antihypertensive regimen. 2. Not addressed 3. LV function needs to be reassessed before the years out 4. Not addressed 5. Discussed avoidance measures to prevent syncope.  2-D Doppler echocardiogram prior to the end of this year to reassess LV function. 6-9 month clinical follow-up.    Medication Adjustments/Labs and Tests Ordered: Current medicines are reviewed at length with the patient today.  Concerns regarding medicines are outlined above.  Medication changes, Labs and Tests ordered today are listed in the Patient Instructions below. Patient Instructions  Medication Instructions:  None  Labwork: None  Testing/Procedures: None  Follow-Up: Your physician wants you to follow-up in: 9 months with Dr. Katrinka BlazingSmith.  You will receive a  reminder letter in the mail two months in advance. If you don't receive a letter, please call our office to schedule the follow-up appointment.   Any Other Special Instructions Will Be Listed Below (If Applicable).     If you need a refill on your cardiac medications before your next appointment, please call your pharmacy.      Signed, Lesleigh NoeHenry W Tanesha Arambula III, MD  02/25/2017 4:52 PM    [facility] [address], [place], [place]  [address] Phone: [phone]) [phone]; Fax: [phone]) [phone]

## 2017-02-25 ENCOUNTER — Encounter: Payer: Self-pay | Admitting: Interventional Cardiology

## 2017-02-25 ENCOUNTER — Ambulatory Visit: Payer: 59 | Admitting: Interventional Cardiology

## 2017-02-25 ENCOUNTER — Ambulatory Visit (INDEPENDENT_AMBULATORY_CARE_PROVIDER_SITE_OTHER): Payer: 59 | Admitting: Interventional Cardiology

## 2017-02-25 VITALS — BP 158/76 | HR 78 | Ht 73.0 in | Wt 250.0 lb

## 2017-02-25 DIAGNOSIS — R55 Syncope and collapse: Secondary | ICD-10-CM | POA: Diagnosis not present

## 2017-02-25 DIAGNOSIS — E784 Other hyperlipidemia: Secondary | ICD-10-CM | POA: Diagnosis not present

## 2017-02-25 DIAGNOSIS — I119 Hypertensive heart disease without heart failure: Secondary | ICD-10-CM

## 2017-02-25 DIAGNOSIS — I429 Cardiomyopathy, unspecified: Secondary | ICD-10-CM

## 2017-02-25 DIAGNOSIS — I6300 Cerebral infarction due to thrombosis of unspecified precerebral artery: Secondary | ICD-10-CM

## 2017-02-25 DIAGNOSIS — E7849 Other hyperlipidemia: Secondary | ICD-10-CM

## 2017-02-25 NOTE — Patient Instructions (Signed)
Medication Instructions:  None  Labwork: None  Testing/Procedures: None  Follow-Up: Your physician wants you to follow-up in: 9 months with Dr. Smith.  You will receive a reminder letter in the mail two months in advance. If you don't receive a letter, please call our office to schedule the follow-up appointment.   Any Other Special Instructions Will Be Listed Below (If Applicable).     If you need a refill on your cardiac medications before your next appointment, please call your pharmacy.   

## 2017-03-22 ENCOUNTER — Ambulatory Visit: Payer: 59 | Admitting: Diagnostic Neuroimaging

## 2017-04-02 ENCOUNTER — Encounter: Payer: Self-pay | Admitting: Primary Care

## 2017-06-24 DIAGNOSIS — Z23 Encounter for immunization: Secondary | ICD-10-CM | POA: Diagnosis not present

## 2017-07-01 ENCOUNTER — Other Ambulatory Visit: Payer: Self-pay

## 2017-07-01 DIAGNOSIS — I6523 Occlusion and stenosis of bilateral carotid arteries: Secondary | ICD-10-CM

## 2017-07-12 ENCOUNTER — Other Ambulatory Visit: Payer: Self-pay

## 2017-07-12 ENCOUNTER — Ambulatory Visit (HOSPITAL_COMMUNITY): Payer: BLUE CROSS/BLUE SHIELD | Attending: Cardiology

## 2017-07-12 DIAGNOSIS — Z8673 Personal history of transient ischemic attack (TIA), and cerebral infarction without residual deficits: Secondary | ICD-10-CM | POA: Insufficient documentation

## 2017-07-12 DIAGNOSIS — R7303 Prediabetes: Secondary | ICD-10-CM | POA: Insufficient documentation

## 2017-07-12 DIAGNOSIS — E669 Obesity, unspecified: Secondary | ICD-10-CM | POA: Insufficient documentation

## 2017-07-12 DIAGNOSIS — I429 Cardiomyopathy, unspecified: Secondary | ICD-10-CM | POA: Insufficient documentation

## 2017-07-12 DIAGNOSIS — I10 Essential (primary) hypertension: Secondary | ICD-10-CM | POA: Diagnosis not present

## 2017-07-12 DIAGNOSIS — I081 Rheumatic disorders of both mitral and tricuspid valves: Secondary | ICD-10-CM | POA: Diagnosis not present

## 2017-07-15 ENCOUNTER — Encounter: Payer: Self-pay | Admitting: Interventional Cardiology

## 2017-07-19 ENCOUNTER — Ambulatory Visit (INDEPENDENT_AMBULATORY_CARE_PROVIDER_SITE_OTHER): Payer: BLUE CROSS/BLUE SHIELD | Admitting: Surgery

## 2017-07-19 ENCOUNTER — Telehealth: Payer: Self-pay | Admitting: Interventional Cardiology

## 2017-07-19 ENCOUNTER — Encounter: Payer: Self-pay | Admitting: Surgery

## 2017-07-19 ENCOUNTER — Ambulatory Visit: Payer: 59

## 2017-07-19 ENCOUNTER — Ambulatory Visit (HOSPITAL_COMMUNITY)
Admission: RE | Admit: 2017-07-19 | Discharge: 2017-07-19 | Disposition: A | Discharge status: Home / Self Care | Payer: BLUE CROSS/BLUE SHIELD | Source: Ambulatory Visit | Attending: Surgery | Admitting: Surgery

## 2017-07-19 ENCOUNTER — Ambulatory Visit: Payer: 59 | Admitting: Family

## 2017-07-19 VITALS — BP 209/97 | HR 68 | Temp 98.0°F | Resp 16 | Ht 73.0 in | Wt 254.0 lb

## 2017-07-19 DIAGNOSIS — I6522 Occlusion and stenosis of left carotid artery: Secondary | ICD-10-CM

## 2017-07-19 DIAGNOSIS — I6523 Occlusion and stenosis of bilateral carotid arteries: Secondary | ICD-10-CM | POA: Diagnosis not present

## 2017-07-19 LAB — VAS US CAROTID
LEFT VERTEBRAL DIAS: 17 cm/s
Left CCA dist dias: -19 cm/s
Left CCA dist sys: -61 cm/s
Left ICA dist dias: -22 cm/s
Left ICA dist sys: -85 cm/s
Left ICA prox dias: -25 cm/s
Left ICA prox sys: -83 cm/s
RIGHT CCA MID DIAS: 16 cm/s
RIGHT ECA DIAS: -14 cm/s
RIGHT VERTEBRAL DIAS: -16 cm/s
Right CCA prox dias: 19 cm/s
Right CCA prox sys: 119 cm/s
Right cca dist sys: -76 cm/s

## 2017-07-19 NOTE — Progress Notes (Signed)
[facility] [place]  Patient name: Jason RibasWarner R Belko Melendez MRN: [id number] DOB: 1956-04-06 Sex: male   REASON FOR VISIT:    Follow up  HISOTRY OF PRESENT ILLNESS:   Jason Melendez is a 61 y.o. male returns today for his first postoperative visit.  He is status post left carotid endarterectomy on 10/16/2016.  This was done following a left brain stroke.  His workup included a CT angiogram which revealed 50% stenosis in the left internal carotid artery and no significant right carotid stenosis.  We debated a while as to whether or not to fix this given the degree of stenosis but ultimately proceeded with repair.  Intraoperative findings included a moderate amount of intraluminal debris which was most likely the cause of his stroke.  He has made a full recovery and no longer has any deficits.  He is concerned about his blood pressure and recent echo results   PAST MEDICAL HISTORY:   Past Medical History:  Diagnosis Date  . HTN (hypertension)   . Hyperlipidemia      FAMILY HISTORY:   Family History  Problem Relation Age of Onset  . Diabetes Mother   . Heart disease Father 6060       CABG  . Diabetes Father   . Kidney disease Father   . Kidney failure Father   . Hypertension Sister     SOCIAL HISTORY:   Social History   Tobacco Use  . Smoking status: Never Smoker  . Smokeless tobacco: Never Used  Substance Use Topics  . Alcohol use: No    Alcohol/week: 0.0 oz     ALLERGIES:   Allergies  Allergen Reactions  . Hydralazine Hcl     Sudden drop in BP     CURRENT MEDICATIONS:   Current Outpatient Medications  Medication Sig Dispense Refill  . aspirin 325 MG tablet Take 1 tablet (325 mg total) by mouth daily. 60 tablet 0  . atorvastatin (LIPITOR) 40 MG tablet Take 1 tablet (40 mg total) by mouth daily at 6 PM. 90 tablet 2  . carvedilol (COREG) 6.25 MG tablet Take 1 tablet (6.25 mg total) by mouth 2  (two) times daily with a meal. 180 tablet 3  . escitalopram (LEXAPRO) 10 MG tablet Take 1 tablet (10 mg total) by mouth daily. 90 tablet 2  . losartan (COZAAR) 50 MG tablet Take 50 mg by mouth daily.     No current facility-administered medications for this visit.     REVIEW OF SYSTEMS:   [X]  denotes positive finding, [ ]  denotes negative finding Cardiac  Comments:  Chest pain or chest pressure:    Shortness of breath upon exertion:    Short of breath when lying flat:    Irregular heart rhythm:        Vascular    Pain in calf, thigh, or hip brought on by ambulation:    Pain in feet at night that wakes you up from your sleep:     Blood clot in your veins:    Leg swelling:         Pulmonary    Oxygen at home:    Productive cough:     Wheezing:         Neurologic    Sudden weakness in arms or legs:     Sudden numbness in arms or legs:     Sudden onset of difficulty speaking or slurred speech:    Temporary loss of vision  in one eye:     Problems with dizziness:         Gastrointestinal    Blood in stool:     Vomited blood:         Genitourinary    Burning when urinating:     Blood in urine:        Psychiatric    Major depression:         Hematologic    Bleeding problems:    Problems with blood clotting too easily:        Skin    Rashes or ulcers:        Constitutional    Fever or chills:      PHYSICAL EXAM:   Vitals:   07/19/17 1550  BP: (!) 209/103  Pulse: 68  Resp: 16  Temp: 98 F (36.7 C)  SpO2: 96%  Weight: 254 lb (115.2 kg)  Height: 6\' 1"  (1.854 m)    GENERAL: The patient is a well-nourished male, in no acute distress. The vital signs are documented above. CARDIAC: There is a regular rate and rhythm.  VASCULAR: no carotid bruits PULMONARY: Non-labored respirations MUSCULOSKELETAL: There are no major deformities or cyanosis. NEUROLOGIC: No focal weakness or paresthesias are detected. SKIN: There are no ulcers or rashes noted. PSYCHIATRIC:  The patient has a normal affect.  STUDIES:   I have reviewed his carotid duplex which shows less than 40% right carotid stenosis and a widely patent left carotid endarterectomy site with no evidence of recurrence.  MEDICAL ISSUES:   Carotid stenosis: The patient's ultrasound today shows that his endarterectomy site is widely patent and the contralateral side is with minimal disease.  He will need surveillance ultrasound in 1 year  Hypertension: The patient's blood pressure was significantly elevated today.  He is asymptomatic.  I am trying to coordinate with Dr. Garnette ScheuermannHank Smith on appointment to address his echo findings as well as his blood pressure.      Durene CalWells Chryl Holten, MD [facility] Tel [phone] Pager [phone]

## 2017-07-20 NOTE — Addendum Note (Signed)
Addended by: Burton ApleyPETTY, Carmino Ocain A on: 07/20/2017 04:34 PM   Modules accepted: Orders

## 2017-07-21 DIAGNOSIS — H5213 Myopia, bilateral: Secondary | ICD-10-CM | POA: Diagnosis not present

## 2017-07-24 ENCOUNTER — Encounter: Payer: Self-pay | Admitting: Primary Care

## 2017-07-24 ENCOUNTER — Other Ambulatory Visit: Payer: Self-pay | Admitting: Primary Care

## 2017-07-24 DIAGNOSIS — R7303 Prediabetes: Secondary | ICD-10-CM

## 2017-07-25 NOTE — Progress Notes (Signed)
Cardiology Office Note    Date:  07/26/2017   ID:  Jason Melendez Jason Melendez, DOB [birth year], MRN [id number]  PCP:  Doreene Nestlark, Katherine K, NP  Cardiologist: Jason NoeHenry W Jazz Biddy III, MD   No chief complaint on file.   History of Present Illness:  Jason Melendez is a 61 y.o. male with recent left brain stroke, high-grade left carotid stenosis treated with surgery by Dr. Myra GianottiBrabham, asymptomatic systolic heart failure, hypertension, hyperlipidemia, and recurrent episodes of syncope (vasovagal).   He has no complaints of dyspnea, orthopnea, or PND.  There is been no chest discomfort or lower extremity swelling.  He denies angina.  Underwent left carotid endarterectomy in the setting of a left frontoparietal acute stroke.  No recurrent neurological symptoms since surgery in February 2018.   Past Medical History:  Diagnosis Date  . HTN (hypertension)   . Hyperlipidemia     Past Surgical History:  Procedure Laterality Date  . ENDARTERECTOMY Left 10/16/2016   Procedure: ENDARTERECTOMY CAROTID;  Surgeon: Nada LibmanVance W Brabham, MD;  [facility];  Service: Vascular;  Laterality: Left;  . HERNIA REPAIR    . PATCH ANGIOPLASTY Left 10/16/2016   Procedure: PATCH ANGIOPLASTY USING Livia SnellenXENOSURE BIOLOGIC PATCH;  Surgeon: Nada LibmanVance W Brabham, MD;  [facility];  Service: Vascular;  Laterality: Left;    Current Medications: Outpatient Medications Prior to Visit  Medication Sig Dispense Refill  . aspirin 325 MG tablet Take 1 tablet (325 mg total) by mouth daily. 60 tablet 0  . atorvastatin (LIPITOR) 40 MG tablet Take 1 tablet (40 mg total) by mouth daily at 6 PM. 90 tablet 2  . carvedilol (COREG) 6.25 MG tablet Take 1 tablet (6.25 mg total) by mouth 2 (two) times daily with a meal. 180 tablet 3  . escitalopram (LEXAPRO) 10 MG tablet Take 1 tablet (10 mg total) by mouth daily. 90 tablet 2  . losartan (COZAAR) 50 MG tablet Take 50 mg by mouth daily.     No facility-administered medications prior to visit.        Allergies:   Hydralazine hcl   Social History   Socioeconomic History  . Marital status: Married    Spouse name: None  . Number of children: None  . Years of education: None  . Highest education level: None  Social Needs  . Financial resource strain: None  . Food insecurity - worry: None  . Food insecurity - inability: None  . Transportation needs - medical: None  . Transportation needs - non-medical: None  Occupational History  . None  Tobacco Use  . Smoking status: Never Smoker  . Smokeless tobacco: Never Used  Substance and Sexual Activity  . Alcohol use: No    Alcohol/week: 0.0 oz  . Drug use: No  . Sexual activity: Not Currently  Other Topics Concern  . None  Social History Narrative   Marital status: married      Children: 2 daughters      Lives:  With wife      Employment: printing work      Tobacco: none      Alcohol: one beer every three months      Drugs:  None      Exercise:  No formal exercise; walks up three flights of stairs several times per day at work.   Lives at home.  Works for [employer].     2 yrs college.       Family History:  The patient's  family history includes Diabetes in his father and mother; Heart disease (age of onset: 74) in his father; Hypertension in his sister; Kidney disease in his father; Kidney failure in his father.   ROS:   Please see the history of present illness.    Denies headaches.  No medication side effects. All other systems reviewed and are negative.   PHYSICAL EXAM:   VS:  BP (!) 168/94   Pulse 76   Ht 6\' 1"  (1.854 m)   Wt 256 lb 6.4 oz (116.3 kg)   BMI 33.83 kg/m    GEN: Well nourished, well developed, in no acute distress  HEENT: normal  Neck: no JVD, carotid bruits, or masses Cardiac: RRR; no murmurs, rubs, or gallops,no edema  Respiratory:  clear to auscultation bilaterally, normal work of breathing GI: soft, nontender, nondistended, + BS MS: no deformity or atrophy  Skin: warm and  dry, no rash Neuro:  Alert and Oriented x 3, Strength and sensation are intact Psych: euthymic mood, full affect  Wt Readings from Last 3 Encounters:  07/26/17 256 lb 6.4 oz (116.3 kg)  07/19/17 254 lb (115.2 kg)  02/25/17 250 lb (113.4 kg)      Studies/Labs Reviewed:   EKG:  EKG  Not done  Recent Labs: 10/12/2016: B Natriuretic Peptide 27.0 10/27/2016: Hemoglobin 14.8; Platelets 218.0 01/22/2017: ALT 11; BUN 16; Creatinine, Ser 1.09; Potassium 4.7; Sodium 143   Lipid Panel    Component Value Date/Time   CHOL 101 01/22/2017 0755   TRIG 99.0 01/22/2017 0755   HDL 24.70 (L) 01/22/2017 0755   CHOLHDL 4 01/22/2017 0755   VLDL 19.8 01/22/2017 0755   LDLCALC 56 01/22/2017 0755    Additional studies/ records that were reviewed today include:  2D Doppler echocardiogram 07/12/2017: Study Conclusions   - Left ventricle: The cavity size was mildly dilated. There was   mild concentric hypertrophy. Systolic function was moderately   reduced. The estimated ejection fraction was in the range of 35%   to 40%. Wall motion was normal; there were no regional wall   motion abnormalities. Doppler parameters are consistent with   abnormal left ventricular relaxation (grade 1 diastolic   dysfunction). Doppler parameters are consistent with elevated   ventricular end-diastolic filling pressure. - Mitral valve: Mildly thickened leaflets . There was mild   regurgitation. - Left atrium: The atrium was mildly dilated. - Right ventricle: Systolic function was normal. - Right atrium: The atrium was normal in size. - Tricuspid valve: There was mild regurgitation. - Pulmonary arteries: Systolic pressure was within the normal   range. - Pericardium, extracardiac: There was no pericardial effusion.   Impressions:   - Since the last study on 10/13/2016 LVEF has decreased from 40-45%   to 35-40%.    ASSESSMENT:    1. Hypertensive heart disease without CHF   2. Neurocardiogenic syncope   3.  Other hyperlipidemia   4. Prediabetes   5. Cardiomyopathy, unspecified type (HCC)      PLAN:  In order of problems listed above:  1. Poor blood pressure control.  Increase losartan to losartan HCT 100/12.5 mg/day.  One month follow-up with basic metabolic panel at that time.  At that time, further up titration of carvedilol will be likely. 2. No recurrence. 3. LDL should be Less than 70. 4. Not addressed 5. LVEF less than or equal to 40%.  Up titration of heart failure therapy.  May consider switching to [facility].  1 month follow-up.  Basic metabolic panel  in 1 month.  Increase losartan HCT to 100/12.5 mg daily    Medication Adjustments/Labs and Tests Ordered: Current medicines are reviewed at length with the patient today.  Concerns regarding medicines are outlined above.  Medication changes, Labs and Tests ordered today are listed in the Patient Instructions below. There are no Patient Instructions on file for this visit.   Signed, Jason NoeHenry W Prestyn Mahn III, MD  07/26/2017 3:03 PM    [facility] [address], [place], [place]  [address] Phone: [phone]) [phone]; Fax: [phone]) [phone]

## 2017-07-26 ENCOUNTER — Encounter: Payer: Self-pay | Admitting: Interventional Cardiology

## 2017-07-26 ENCOUNTER — Other Ambulatory Visit (INDEPENDENT_AMBULATORY_CARE_PROVIDER_SITE_OTHER): Payer: BLUE CROSS/BLUE SHIELD

## 2017-07-26 ENCOUNTER — Ambulatory Visit (INDEPENDENT_AMBULATORY_CARE_PROVIDER_SITE_OTHER): Payer: BLUE CROSS/BLUE SHIELD | Admitting: Interventional Cardiology

## 2017-07-26 VITALS — BP 168/94 | HR 76 | Ht 73.0 in | Wt 256.4 lb

## 2017-07-26 DIAGNOSIS — E7849 Other hyperlipidemia: Secondary | ICD-10-CM | POA: Diagnosis not present

## 2017-07-26 DIAGNOSIS — R7303 Prediabetes: Secondary | ICD-10-CM | POA: Diagnosis not present

## 2017-07-26 DIAGNOSIS — R55 Syncope and collapse: Secondary | ICD-10-CM

## 2017-07-26 DIAGNOSIS — I429 Cardiomyopathy, unspecified: Secondary | ICD-10-CM

## 2017-07-26 DIAGNOSIS — I119 Hypertensive heart disease without heart failure: Secondary | ICD-10-CM

## 2017-07-26 LAB — HEMOGLOBIN A1C: Hgb A1c MFr Bld: 6.3 % (ref 4.6–6.5)

## 2017-07-26 MED ORDER — LOSARTAN POTASSIUM-HCTZ 100-12.5 MG PO TABS: 1.0000 | ORAL_TABLET | Freq: Every day | ORAL | 3 refills | Status: DC

## 2017-07-26 NOTE — Patient Instructions (Addendum)
Medication Instructions:  1) INCREASE Cozaar to 100mg  once daily until you run out, then 2) START Hyzaar 100/12.5mg  once daily  Labwork: BMET at the time of your follow up appointment  Testing/Procedures: None  Follow-Up: Your physician recommends that you schedule a follow-up appointment in: 1 month with Dr. Katrinka BlazingSmith. (Can have end slot at end of clinic on 12/20)   Any Other Special Instructions Will Be Listed Below (If Applicable).  The goal for your blood pressure is 130/85 or lower.    If you need a refill on your cardiac medications before your next appointment, please call your pharmacy.

## 2017-08-05 ENCOUNTER — Encounter: Payer: Self-pay | Admitting: Primary Care

## 2017-08-10 ENCOUNTER — Encounter: Payer: Self-pay | Admitting: Interventional Cardiology

## 2017-08-11 ENCOUNTER — Encounter: Payer: Self-pay | Admitting: Interventional Cardiology

## 2017-08-12 ENCOUNTER — Other Ambulatory Visit: Payer: Self-pay | Admitting: *Deleted

## 2017-08-12 MED ORDER — CARVEDILOL 6.25 MG PO TABS: 6.2500 mg | ORAL_TABLET | Freq: Two times a day (BID) | ORAL | 3 refills | Status: DC

## 2017-08-13 ENCOUNTER — Encounter: Payer: Self-pay | Admitting: Interventional Cardiology

## 2017-08-18 NOTE — Progress Notes (Signed)
Cardiology Office Note    Date:  08/19/2017   ID:  [name], DOB 05/29/1956, MRN [id number]  PCP:  Doreene Nestlark, Katherine K, NP  Cardiologist: Lesleigh NoeHenry W Enrico Eaddy III, MD   Chief Complaint  Patient presents with  . Congestive Heart Failure  . Follow-up    History of Present Illness:  Jason Melendez is a 61 y.o. male with recent left brain stroke, high-grade left carotid stenosis treated with surgery by Dr. Myra GianottiBrabham, asymptomatic systolic heart failure, hypertension, hyperlipidemia, and recurrent episodes of syncope (vasovagal).    He is doing well.  No side effects from current medical regimen.  He denies shortness of breath.  He denies chest pain.  Blood pressures at home have still been running greater than 140 mmHg systolic.  We are attempting to optimize therapy to protect LV function and hopefully bring about improvement.   Past Medical History:  Diagnosis Date  . HTN (hypertension)   . Hyperlipidemia     Past Surgical History:  Procedure Laterality Date  . ENDARTERECTOMY Left 10/16/2016   Procedure: ENDARTERECTOMY CAROTID;  Surgeon: Nada LibmanVance W Brabham, MD;  [facility];  Service: Vascular;  Laterality: Left;  . HERNIA REPAIR    . PATCH ANGIOPLASTY Left 10/16/2016   Procedure: PATCH ANGIOPLASTY USING Livia SnellenXENOSURE BIOLOGIC PATCH;  Surgeon: Nada LibmanVance W Brabham, MD;  [facility];  Service: Vascular;  Laterality: Left;    Current Medications: Outpatient Medications Prior to Visit  Medication Sig Dispense Refill  . aspirin 325 MG tablet Take 1 tablet (325 mg total) by mouth daily. 60 tablet 0  . atorvastatin (LIPITOR) 40 MG tablet Take 1 tablet (40 mg total) by mouth daily at 6 PM. 90 tablet 2  . carvedilol (COREG) 6.25 MG tablet Take 6.25 mg by mouth 2 (two) times daily with a meal.    . escitalopram (LEXAPRO) 10 MG tablet Take 1 tablet (10 mg total) by mouth daily. 90 tablet 2  . losartan-hydrochlorothiazide (HYZAAR) 100-12.5 MG tablet Take 1 tablet by mouth daily.  90 tablet 3  . carvedilol (COREG) 6.25 MG tablet Take 1 tablet (6.25 mg total) by mouth 2 (two) times daily with a meal. (Patient not taking: Reported on 08/19/2017) 60 tablet 3   No facility-administered medications prior to visit.      Allergies:   Hydralazine hcl   Social History   Socioeconomic History  . Marital status: Married    Spouse name: None  . Number of children: None  . Years of education: None  . Highest education level: None  Social Needs  . Financial resource strain: None  . Food insecurity - worry: None  . Food insecurity - inability: None  . Transportation needs - medical: None  . Transportation needs - non-medical: None  Occupational History  . None  Tobacco Use  . Smoking status: Never Smoker  . Smokeless tobacco: Never Used  Substance and Sexual Activity  . Alcohol use: No    Alcohol/week: 0.0 oz  . Drug use: No  . Sexual activity: Not Currently  Other Topics Concern  . None  Social History Narrative   Marital status: married      Children: 2 daughters      Lives:  With wife      Employment: printing work      Tobacco: none      Alcohol: one beer every three months      Drugs:  None      Exercise:  No  formal exercise; walks up three flights of stairs several times per day at work.   Lives at home.  Works for [employer].     2 yrs college.       Family History:  The patient's family history includes Diabetes in his father and mother; Heart disease (age of onset: 24) in his father; Hypertension in his sister; Kidney disease in his father; Kidney failure in his father.   ROS:   Please see the history of present illness.    He has tendency towards vasovagal syncope.  No full-fledged episodes since last office visit. All other systems reviewed and are negative.   PHYSICAL EXAM:   VS:  BP (!) 164/80   Pulse (!) 52   Ht 6\' 1"  (1.854 m)   Wt 259 lb (117.5 kg)   BMI 34.17 kg/m    GEN: Well nourished, well developed, in no acute  distress  HEENT: normal  Neck: no JVD, carotid bruits, or masses Cardiac:iRR; no murmurs, rubs, or gallops,no edema  Respiratory:  clear to auscultation bilaterally, normal work of breathing GI: soft, nontender, nondistended, + BS MS: no deformity or atrophy  Skin: warm and dry, no rash Neuro:  Alert and Oriented x 3, Strength and sensation are intact Psych: euthymic mood, full affect  Wt Readings from Last 3 Encounters:  08/19/17 259 lb (117.5 kg)  07/26/17 256 lb 6.4 oz (116.3 kg)  07/19/17 254 lb (115.2 kg)      Studies/Labs Reviewed:   EKG:  EKG not repeated  Recent Labs: 10/12/2016: B Natriuretic Peptide 27.0 10/27/2016: Hemoglobin 14.8; Platelets 218.0 01/22/2017: ALT 11; BUN 16; Creatinine, Ser 1.09; Potassium 4.7; Sodium 143   Lipid Panel    Component Value Date/Time   CHOL 101 01/22/2017 0755   TRIG 99.0 01/22/2017 0755   HDL 24.70 (L) 01/22/2017 0755   CHOLHDL 4 01/22/2017 0755   VLDL 19.8 01/22/2017 0755   LDLCALC 56 01/22/2017 0755    Additional studies/ records that were reviewed today include:  No new data.  Blood work is being obtained today.    ASSESSMENT:    1. Hypertensive heart disease without CHF   2. Chronic systolic heart failure (HCC)   3. Neurocardiogenic syncope   4. Cerebrovascular accident (CVA) due to thrombosis of precerebral artery (HCC)   5. Other hyperlipidemia      PLAN:  In order of problems listed above:  1. Blood pressure still not as well-controlled as we would like.  Start amlodipine 5 mg/day.  Target less than 140/90 mmHg.  To low if less than 100 mmHg systolic. 2. Tolerating losartan HCT 100/12.5 mg without difficulty.  Carvedilol 6.25 mg twice daily is being tolerated.  Heart rate is 52 bpm, so cannot further increase. 3. Talked about avoidance strategy when prodrome occurs. 4. Continue current therapy as listed.  Follow-up in 2-3 months.  Continue to monitor blood pressure.  Basic metabolic panel will be obtained  today.  Medication Adjustments/Labs and Tests Ordered: Current medicines are reviewed at length with the patient today.  Concerns regarding medicines are outlined above.  Medication changes, Labs and Tests ordered today are listed in the Patient Instructions below. Patient Instructions  Medication Instructions:  1) START Amlodipine 5mg  once daily  Labwork: None  Testing/Procedures: None  Follow-Up: Your physician recommends that you schedule a follow-up appointment in: 3 months with Dr. Katrinka Blazing.     Any Other Special Instructions Will Be Listed Below (If Applicable).     If  you need a refill on your cardiac medications before your next appointment, please call your pharmacy.      Signed, Lesleigh NoeHenry W Shun Pletz III, MD  08/19/2017 11:50 AM    [facility] [address], [place], [place]  [address] Phone: [phone]; Fax: [phone]

## 2017-08-19 ENCOUNTER — Ambulatory Visit (INDEPENDENT_AMBULATORY_CARE_PROVIDER_SITE_OTHER): Payer: BLUE CROSS/BLUE SHIELD | Admitting: Interventional Cardiology

## 2017-08-19 ENCOUNTER — Other Ambulatory Visit: Payer: BLUE CROSS/BLUE SHIELD | Admitting: *Deleted

## 2017-08-19 ENCOUNTER — Encounter: Payer: Self-pay | Admitting: Interventional Cardiology

## 2017-08-19 VITALS — BP 164/80 | HR 52 | Ht 73.0 in | Wt 259.0 lb

## 2017-08-19 DIAGNOSIS — I6300 Cerebral infarction due to thrombosis of unspecified precerebral artery: Secondary | ICD-10-CM

## 2017-08-19 DIAGNOSIS — R55 Syncope and collapse: Secondary | ICD-10-CM | POA: Diagnosis not present

## 2017-08-19 DIAGNOSIS — I5022 Chronic systolic (congestive) heart failure: Secondary | ICD-10-CM | POA: Diagnosis not present

## 2017-08-19 DIAGNOSIS — I429 Cardiomyopathy, unspecified: Secondary | ICD-10-CM | POA: Diagnosis not present

## 2017-08-19 DIAGNOSIS — I119 Hypertensive heart disease without heart failure: Secondary | ICD-10-CM

## 2017-08-19 DIAGNOSIS — E7849 Other hyperlipidemia: Secondary | ICD-10-CM

## 2017-08-19 LAB — BASIC METABOLIC PANEL
BUN/Creatinine Ratio: 15 (ref 10–24)
BUN: 14 mg/dL (ref 8–27)
CO2: 26 mmol/L (ref 20–29)
Calcium: 9.3 mg/dL (ref 8.6–10.2)
Chloride: 102 mmol/L (ref 96–106)
Creatinine, Ser: 0.95 mg/dL (ref 0.76–1.27)
GFR calc Af Amer: 99 mL/min/{1.73_m2} (ref >59)
GFR calc non Af Amer: 86 mL/min/{1.73_m2} (ref >59)
Glucose: 242 mg/dL — ABNORMAL HIGH (ref 65–99)
Potassium: 4.1 mmol/L (ref 3.5–5.2)
Sodium: 141 mmol/L (ref 134–144)

## 2017-08-19 MED ORDER — AMLODIPINE BESYLATE 5 MG PO TABS: 5.0000 mg | ORAL_TABLET | Freq: Every day | ORAL | 3 refills | Status: DC

## 2017-08-19 NOTE — Patient Instructions (Signed)
Medication Instructions:  1) START Amlodipine 5mg  once daily  Labwork: None  Testing/Procedures: None  Follow-Up: Your physician recommends that you schedule a follow-up appointment in: 3 months with Dr. Katrinka BlazingSmith.     Any Other Special Instructions Will Be Listed Below (If Applicable).     If you need a refill on your cardiac medications before your next appointment, please call your pharmacy.

## 2017-08-20 NOTE — Telephone Encounter (Signed)
Closed Encounter  °

## 2017-09-08 ENCOUNTER — Encounter: Payer: Self-pay | Admitting: Primary Care

## 2017-09-08 DIAGNOSIS — E785 Hyperlipidemia, unspecified: Secondary | ICD-10-CM

## 2017-09-08 MED ORDER — ATORVASTATIN CALCIUM 40 MG PO TABS: 40.0000 mg | ORAL_TABLET | Freq: Every day | ORAL | 1 refills | Status: DC

## 2017-09-19 ENCOUNTER — Other Ambulatory Visit: Payer: Self-pay | Admitting: Primary Care

## 2017-09-19 ENCOUNTER — Encounter: Payer: Self-pay | Admitting: Primary Care

## 2017-09-19 DIAGNOSIS — F411 Generalized anxiety disorder: Secondary | ICD-10-CM

## 2017-11-07 ENCOUNTER — Encounter: Payer: Self-pay | Admitting: Interventional Cardiology

## 2017-11-08 ENCOUNTER — Other Ambulatory Visit: Payer: Self-pay | Admitting: *Deleted

## 2017-11-08 MED ORDER — CARVEDILOL 6.25 MG PO TABS: 6.2500 mg | ORAL_TABLET | Freq: Two times a day (BID) | ORAL | 2 refills | Status: DC

## 2017-11-16 DIAGNOSIS — I5042 Chronic combined systolic (congestive) and diastolic (congestive) heart failure: Secondary | ICD-10-CM | POA: Insufficient documentation

## 2017-11-16 NOTE — Progress Notes (Signed)
Cardiology Office Note    Date:  11/17/2017   ID:  Jason Melendez, DOB [birth year], MRN [id number]  PCP:  Doreene Nestlark, Katherine K, NP  Cardiologist: Lesleigh NoeHenry W Itxel Melendez III, MD   Chief Complaint  Patient presents with  . Congestive Heart Failure    History of Present Illness:  Jason Melendez is a 62 y.o. male  with recent left brain stroke, high-grade left carotid stenosis treated with surgery by Dr. Myra GianottiBrabham, asymptomatic systolic heart failure, hypertension, hyperlipidemia, and recurrent episodes of syncope (vasovagal).    The patient has chronic combined systolic and diastolic heart failure on guideline directed medical therapy for LV preservation.  Feels well with the exception of stress related to work.  He denies chest pain, orthopnea, PND, leg swelling, syncope, and chest pain.  Compliant with his current medical regimen.   Past Medical History:  Diagnosis Date  . HTN (hypertension)   . Hyperlipidemia     Past Surgical History:  Procedure Laterality Date  . ENDARTERECTOMY Left 10/16/2016   Procedure: ENDARTERECTOMY CAROTID;  Surgeon: Nada LibmanVance W Brabham, MD;  [facility];  Service: Vascular;  Laterality: Left;  . HERNIA REPAIR    . PATCH ANGIOPLASTY Left 10/16/2016   Procedure: PATCH ANGIOPLASTY USING Livia SnellenXENOSURE BIOLOGIC PATCH;  Surgeon: Nada LibmanVance W Brabham, MD;  [facility];  Service: Vascular;  Laterality: Left;    Current Medications: Outpatient Medications Prior to Visit  Medication Sig Dispense Refill  . acetaminophen (TYLENOL 8 HOUR ARTHRITIS PAIN) 650 MG CR tablet Take 650 mg by mouth 2 (two) times daily.    Marland Kitchen. amLODipine (NORVASC) 5 MG tablet Take 1 tablet (5 mg total) by mouth daily. 90 tablet 3  . aspirin 325 MG tablet Take 1 tablet (325 mg total) by mouth daily. 60 tablet 0  . atorvastatin (LIPITOR) 40 MG tablet Take 1 tablet (40 mg total) by mouth daily at 6 PM. 90 tablet 1  . carvedilol (COREG) 6.25 MG tablet Take 1 tablet (6.25 mg total) by mouth 2 (two)  times daily with a meal. 180 tablet 2  . escitalopram (LEXAPRO) 10 MG tablet TAKE 1 TABLET (10 MG TOTAL) BY MOUTH DAILY. 90 tablet 1  . losartan-hydrochlorothiazide (HYZAAR) 100-12.5 MG tablet Take 1 tablet by mouth daily. 90 tablet 3   No facility-administered medications prior to visit.      Allergies:   Hydralazine hcl   Social History   Socioeconomic History  . Marital status: Married    Spouse name: None  . Number of children: None  . Years of education: None  . Highest education level: None  Social Needs  . Financial resource strain: None  . Food insecurity - worry: None  . Food insecurity - inability: None  . Transportation needs - medical: None  . Transportation needs - non-medical: None  Occupational History  . None  Tobacco Use  . Smoking status: Never Smoker  . Smokeless tobacco: Never Used  Substance and Sexual Activity  . Alcohol use: No    Alcohol/week: 0.0 oz  . Drug use: No  . Sexual activity: Not Currently  Other Topics Concern  . None  Social History Narrative   Marital status: married      Children: 2 daughters      Lives:  With wife      Employment: printing work      Tobacco: none      Alcohol: one beer every three months      Drugs:  None      Exercise:  No formal exercise; walks up three flights of stairs several times per day at work.   Lives at home.  Works for [employer].     2 yrs college.       Family History:  The patient's family history includes Diabetes in his father and mother; Heart disease (age of onset: 58) in his father; Hypertension in his sister; Kidney disease in his father; Kidney failure in his father.   ROS:   Please see the history of present illness.    Stress and anxiety on his job.  Frequently pressures when under stress.  Otherwise home blood pressure monitor results reveal systolic blood pressures generally under 140 mmHg. All other systems reviewed and are negative.   PHYSICAL EXAM:   VS:  BP (!)  162/84   Pulse 66   Ht 6\' 1"  (1.854 m)   Wt 262 lb 12.8 oz (119.2 kg)   BMI 34.67 kg/m    GEN: Well nourished, well developed, in no acute distress.  Obese HEENT: normal  Neck: no JVD, carotid bruits, or masses Cardiac: RRR; no murmurs, rubs, or gallops,no edema  Respiratory:  clear to auscultation bilaterally, normal work of breathing GI: soft, nontender, nondistended, + BS MS: no deformity or atrophy  Skin: warm and dry, no rash Neuro:  Alert and Oriented x 3, Strength and sensation are intact Psych: euthymic mood, full affect  Wt Readings from Last 3 Encounters:  11/17/17 262 lb 12.8 oz (119.2 kg)  08/19/17 259 lb (117.5 kg)  07/26/17 256 lb 6.4 oz (116.3 kg)      Studies/Labs Reviewed:   EKG:  EKG sinus rhythm without significant abnormality.  Recent Labs: 01/22/2017: ALT 11 08/19/2017: BUN 14; Creatinine, Ser 0.95; Potassium 4.1; Sodium 141   Lipid Panel    Component Value Date/Time   CHOL 101 01/22/2017 0755   TRIG 99.0 01/22/2017 0755   HDL 24.70 (L) 01/22/2017 0755   CHOLHDL 4 01/22/2017 0755   VLDL 19.8 01/22/2017 0755   LDLCALC 56 01/22/2017 0755    Additional studies/ records that were reviewed today include:  2D Doppler echocardiogram 07/12/17: Study Conclusions   - Left ventricle: The cavity size was mildly dilated. There was   mild concentric hypertrophy. Systolic function was moderately   reduced. The estimated ejection fraction was in the range of 35%   to 40%. Wall motion was normal; there were no regional wall   motion abnormalities. Doppler parameters are consistent with   abnormal left ventricular relaxation (grade 1 diastolic   dysfunction). Doppler parameters are consistent with elevated   ventricular end-diastolic filling pressure. - Mitral valve: Mildly thickened leaflets . There was mild   regurgitation. - Left atrium: The atrium was mildly dilated. - Right ventricle: Systolic function was normal. - Right atrium: The atrium was normal  in size. - Tricuspid valve: There was mild regurgitation. - Pulmonary arteries: Systolic pressure was within the normal   range. - Pericardium, extracardiac: There was no pericardial effusion.   Impressions:   - Since the last study on 10/13/2016 LVEF has decreased from 40-45%   to 35-40%.    ASSESSMENT:    1. Chronic combined systolic and diastolic heart failure (HCC)   2. Hypertensive heart disease without CHF   3. Neurocardiogenic syncope   4. Cerebrovascular accident (CVA) due to thrombosis of precerebral artery (HCC)   5. Other hyperlipidemia      PLAN:  In order of problems  listed above:  1. Increase diuretic component of ARB/diuretic to Hyzaar 100/25 mg/day. 2. Blood pressure is elevated today.  Increase HCTZ to 25 mg/day and consider switching to chlorthalidone.  Basic metabolic panel in 1 month and blood pressure clinic at that time as well. 3. No recurrence 4. Followed by Dr. Myra Gianotti 5. LDL target less than 70 and was noted to be 56 in May 2018 by Dr. [name].  Continue to monitor blood pressure closely at home.  Target is 130/80 mmHg.  Increase HCTZ to 25 mg/day with other medications remaining the same.  Clinical follow-up with me in 6 months.  If blood pressure remains elevated further increase beta-blocker therapy.  This will help assist with preservation of LV function.    Medication Adjustments/Labs and Tests Ordered: Current medicines are reviewed at length with the patient today.  Concerns regarding medicines are outlined above.  Medication changes, Labs and Tests ordered today are listed in the Patient Instructions below. There are no Patient Instructions on file for this visit.   Signed, Lesleigh Noe, MD  11/17/2017 4:33 PM    [facility] [address] [place], [place], [place]  [address] Phone: [phone]; Fax: [phone]

## 2017-11-17 ENCOUNTER — Ambulatory Visit (INDEPENDENT_AMBULATORY_CARE_PROVIDER_SITE_OTHER): Payer: BLUE CROSS/BLUE SHIELD | Admitting: Interventional Cardiology

## 2017-11-17 ENCOUNTER — Encounter: Payer: Self-pay | Admitting: Interventional Cardiology

## 2017-11-17 VITALS — BP 162/84 | HR 66 | Ht 73.0 in | Wt 262.8 lb

## 2017-11-17 DIAGNOSIS — I119 Hypertensive heart disease without heart failure: Secondary | ICD-10-CM

## 2017-11-17 DIAGNOSIS — I6300 Cerebral infarction due to thrombosis of unspecified precerebral artery: Secondary | ICD-10-CM

## 2017-11-17 DIAGNOSIS — E7849 Other hyperlipidemia: Secondary | ICD-10-CM

## 2017-11-17 DIAGNOSIS — I5042 Chronic combined systolic (congestive) and diastolic (congestive) heart failure: Secondary | ICD-10-CM

## 2017-11-17 DIAGNOSIS — R55 Syncope and collapse: Secondary | ICD-10-CM | POA: Diagnosis not present

## 2017-11-17 MED ORDER — LOSARTAN POTASSIUM-HCTZ 100-25 MG PO TABS: 1.0000 | ORAL_TABLET | Freq: Every day | ORAL | 3 refills | Status: DC

## 2017-11-17 NOTE — Patient Instructions (Signed)
Medication Instructions:  1) INCREASE your Hyzaar to 100/25mg  once daily  Labwork: BMET on the same day as your Hypertension Clinic appointment.  Testing/Procedures: None  Follow-Up: Your physician recommends that you schedule a follow-up appointment in: 1 month with our Hypertension Clinic.  Your physician wants you to follow-up in: 6 months with Dr. Katrinka BlazingSmith.  You will receive a reminder letter in the mail two months in advance. If you don't receive a letter, please call our office to schedule the follow-up appointment.    Any Other Special Instructions Will Be Listed Below (If Applicable).     If you need a refill on your cardiac medications before your next appointment, please call your pharmacy.

## 2017-11-18 ENCOUNTER — Encounter: Payer: Self-pay | Admitting: Interventional Cardiology

## 2017-12-30 ENCOUNTER — Other Ambulatory Visit: Payer: BLUE CROSS/BLUE SHIELD | Admitting: *Deleted

## 2017-12-30 ENCOUNTER — Ambulatory Visit (INDEPENDENT_AMBULATORY_CARE_PROVIDER_SITE_OTHER): Payer: BLUE CROSS/BLUE SHIELD | Admitting: Pharmacist

## 2017-12-30 VITALS — BP 148/80 | HR 64 | Ht 73.0 in | Wt 267.0 lb

## 2017-12-30 DIAGNOSIS — I5042 Chronic combined systolic (congestive) and diastolic (congestive) heart failure: Secondary | ICD-10-CM

## 2017-12-30 DIAGNOSIS — I10 Essential (primary) hypertension: Secondary | ICD-10-CM | POA: Insufficient documentation

## 2017-12-30 NOTE — Patient Instructions (Addendum)
If your labs come back stable, we will:  Continue carvedilol and Hyzaar  Stop amlodipine  Start spironolactone  once a day  Follow up in clinic in 2 weeks

## 2017-12-30 NOTE — Progress Notes (Signed)
Patient ID: [name]                 DOB: 08/12/1956                      MRN: [id number]     HPI: Jason Melendez is a pleasant 62 y.o. male referred by Dr. Katrinka Melendez to HTN clinic. 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 BP was elevated at 162/84 at last visit 1 month ago, and Hyzaar was increased to 100-25mg  daily.  Patient presents today in good spirits. He is tolerating his higher dose of Hyzaar well. Denies dizziness, blurred vision, falls, or headache. He has been more fatigued lately - recently had to stop halfway while mowing the lawn. Weight and BP are a bit higher in clinic than home readings. Home weight ~260 and clinic ~267 lbs. Home BP ranging 115-125 systolic, upper 140s in clinic. He reports readings tend to be higher in clinic. He is drinking 2L of fluid a day and weighs himself in the morning. Reports a bit more weight in his belly - may be due to fluid. He sleeps in a recliner but has always done so due to an inner ear problem.  Current HTN meds: carvedilol 6.25mg  BID, losartan-HCTZ 100-25mg  daily, amlodipine  daily Previously tried: hydralazine - sudden drop in BP  BP goal: <130/64mmHg  Family History: The patient's family history includes Diabetes in his father and mother; Heart disease (age of onset: 85) in his father; Hypertension in his sister; Kidney disease in his father; Kidney failure in his father.   Social History: Denies tobacco, alcohol, and illicit drug use. 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 Lunch - [place] or pimento crisps, occasionally pizza. Supper - eats at home. Veggies, lean meat, and carbs. Drinks Diet [employer] or carbonated seltzer water.  Exercise: Walks somewhat at work - [employer]. Does have knee pain that limits  activity.  Wt Readings from Last 3 Encounters:  11/17/17 262 lb 12.8 oz (119.2 kg)  08/19/17 259 lb (117.5 kg)  07/26/17 256 lb 6.4 oz (116.3 kg)   BP Readings from Last 3 Encounters:  11/17/17 (!) 162/84  08/19/17 (!) 164/80  07/26/17 (!) 168/94   Pulse Readings from Last 3 Encounters:  11/17/17 66  08/19/17 (!) 52  07/26/17 76    Renal function: CrCl cannot be calculated (Patient's most recent lab result is older than the maximum 21 days allowed.).  Past Medical History:  Diagnosis Date  . HTN (hypertension)   . Hyperlipidemia     Current Outpatient Medications on File Prior to Visit  Medication Sig Dispense Refill  . acetaminophen (TYLENOL 8 HOUR ARTHRITIS PAIN) 650 MG CR tablet Take 650 mg by mouth 2 (two) times daily.    Marland Kitchen amLODipine (NORVASC) 5 MG tablet Take 1 tablet (5 mg total) by mouth daily. 90 tablet 3  . aspirin 325 MG tablet Take 1 tablet (325 mg total) by mouth daily. 60 tablet 0  . atorvastatin (LIPITOR) 40 MG tablet Take 1 tablet (40 mg total) by mouth daily at 6 PM. 90 tablet 1  . carvedilol (COREG) 6.25 MG tablet Take 1 tablet (6.25 mg total) by mouth 2 (two) times daily with a meal. 180 tablet 2  . escitalopram (  LEXAPRO) 10 MG tablet TAKE 1 TABLET (10 MG TOTAL) BY MOUTH DAILY. 90 tablet 1  . losartan-hydrochlorothiazide (HYZAAR) 100-25 MG tablet Take 1 tablet by mouth daily. 90 tablet 3   No current facility-administered medications on file prior to visit.     Allergies  Allergen Reactions  . Hydralazine Hcl     Sudden drop in BP     Assessment/Plan:  1. Hypertension/HF medication optimization - BP a bit higher in clinic today although well controlled at home < 130/60mmHg. Will plan to optimize HF medications since pt has been feeling fatigued more lately and weight has slowly been trending up. BMET checked today is stable. Will stop amlodipine and start spironolactone  daily for continued BP control and better outcomes in HF. Will f/u in  clinic in 2 weeks for BP check and BMET. At that time, will plan to stop HCTZ and start Lasix to help control fluid better. Advised pt to continue to monitor BP and to bring in home BP readings and cuff to next visit.   Megan E. Supple, PharmD, CPP, BCACP [facility] [address] [place], [place] [address] Phone: [phone]; Fax: [phone] 12/30/2017 4:35 PM

## 2017-12-31 LAB — BASIC METABOLIC PANEL WITH GFR
BUN/Creatinine Ratio: 16 (ref 10–24)
BUN: 15 mg/dL (ref 8–27)
CO2: 26 mmol/L (ref 20–29)
Calcium: 9.3 mg/dL (ref 8.6–10.2)
Chloride: 101 mmol/L (ref 96–106)
Creatinine, Ser: 0.91 mg/dL (ref 0.76–1.27)
GFR calc Af Amer: 105 mL/min/1.73
GFR calc non Af Amer: 91 mL/min/1.73
Glucose: 111 mg/dL — ABNORMAL HIGH (ref 65–99)
Potassium: 4 mmol/L (ref 3.5–5.2)
Sodium: 140 mmol/L (ref 134–144)

## 2017-12-31 MED ORDER — SPIRONOLACTONE 25 MG PO TABS: 25.0000 mg | ORAL_TABLET | Freq: Every day | ORAL | 11 refills | Status: DC

## 2018-01-14 ENCOUNTER — Other Ambulatory Visit: Payer: BLUE CROSS/BLUE SHIELD

## 2018-01-14 ENCOUNTER — Encounter: Payer: Self-pay | Admitting: Pharmacist

## 2018-01-14 ENCOUNTER — Ambulatory Visit (INDEPENDENT_AMBULATORY_CARE_PROVIDER_SITE_OTHER): Payer: BLUE CROSS/BLUE SHIELD | Admitting: Pharmacist

## 2018-01-14 DIAGNOSIS — I5042 Chronic combined systolic (congestive) and diastolic (congestive) heart failure: Secondary | ICD-10-CM

## 2018-01-14 DIAGNOSIS — I10 Essential (primary) hypertension: Secondary | ICD-10-CM

## 2018-01-14 MED ORDER — FUROSEMIDE 20 MG PO TABS: 20.0000 mg | ORAL_TABLET | Freq: Every day | ORAL | 1 refills | Status: DC | PRN

## 2018-01-14 NOTE — Assessment & Plan Note (Addendum)
Blood pressure is much improved today and at desired goal of <130/80 after letting patient rest quietly for 2 minutes.  Patient reports he is breathing better and feeling more energetic. Noted weight loss ~ 3lbs in 2 weeks. Will add furosemide  daily as needed for fluid retention and continue all other medication as previously prescribed. BMET repeat done today but result not available yet. Plan to call patient on Monday with update and follow up with HTN clinic as needed.

## 2018-01-14 NOTE — Progress Notes (Signed)
Patient ID: Jason Melendez                 DOB: Sep 05, 1955                      MRN: [id number]     HPI: Jason Melendez is a 62 y.o. male referred by Dr. Katrinka Blazing to HTN clinic. 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 During recent BP follow up his amlodipine was discontinued and spironolactone  daily was started.  Patient presents today in good spirits. Denies dizziness, blurred vision, falls, or headache. Reports feeling more energetic, ability to walk longer distance and he is able to do yard work.  Current HTN meds: carvedilol 6.25mg  BID losartan-HCTZ 100-25mg  daily Spironolactone  daily   Previously tried:  hydralazine - sudden drop in BP   BP goal: <130/45mmHg  Family History: The patient's family history includes Diabetes in his father and mother; Heart disease (age of onset: 71) in his father; Hypertension in his sister; Kidney disease in his father; Kidney failure in his father.  Social History: Denies tobacco, alcohol, and illicit drug use. 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 Lunch - [place] or pimento crisps, occasionally pizza. Supper - eats at home. Veggies, lean meat, and carbs. Drinks Diet [employer] or carbonated seltzer water.  Exercise: Walks somewhat at work - [employer]. Does have knee pain that limits activity.  Home BP readings:  6 readings; average 121/65 (1/2 reading done at home and 1/2 with office BP cuff)  Wt Readings from Last 3 Encounters:  01/14/18 264 lb 12.8 oz (120.1 kg)  12/30/17 267 lb (121.1 kg)  11/17/17 262 lb 12.8 oz (119.2 kg)   BP Readings from Last 3 Encounters:  01/14/18 (!) 126/58  12/30/17 (!) 148/80  11/17/17 (!) 162/84   Pulse Readings from Last 3 Encounters:  01/14/18 72  12/30/17 64    11/17/17 66    Renal function: Estimated Creatinine Clearance: 115.8 mL/min (by C-G formula based on SCr of 0.91 mg/dL).  Past Medical History:  Diagnosis Date  . HTN (hypertension)   . Hyperlipidemia     Current Outpatient Medications on File Prior to Visit  Medication Sig Dispense Refill  . acetaminophen (TYLENOL 8 HOUR ARTHRITIS PAIN) 650 MG CR tablet Take 650 mg by mouth 2 (two) times daily.    Marland Kitchen aspirin 325 MG tablet Take 1 tablet (325 mg total) by mouth daily. 60 tablet 0  . atorvastatin (LIPITOR) 40 MG tablet Take 1 tablet (40 mg total) by mouth daily at 6 PM. 90 tablet 1  . carvedilol (COREG) 6.25 MG tablet Take 1 tablet (6.25 mg total) by mouth 2 (two) times daily with a meal. 180 tablet 2  . escitalopram (LEXAPRO) 10 MG tablet TAKE 1 TABLET (10 MG TOTAL) BY MOUTH DAILY. 90 tablet 1  . losartan-hydrochlorothiazide (HYZAAR) 100-25 MG tablet Take 1 tablet by mouth daily. 90 tablet 3  . spironolactone (ALDACTONE) 25 MG tablet Take 1 tablet (25 mg total) by mouth daily. 30 tablet 11   No current facility-administered medications on file prior to visit.     Allergies  Allergen Reactions  . Hydralazine Hcl     Sudden drop in BP    Blood pressure (!) 126/58, pulse 72, height   (1.854 m), weight 264 lb 12.8 oz (120.1 kg), SpO2 93 %.  Chronic combined systolic and diastolic heart failure (HCC) Blood pressure is much improved today and at desired goal of <130/80 after letting patient rest quietly for 2 minutes.  Patient reports he is breathing better and feeling more energetic. Noted weight loss ~ 3lbs in 2 weeks. Will add furosemide  daily as needed for fluid retention and continue all other medication as previously prescribed. BMET repeat done today but result not available yet. Plan to call patient on Monday with update and follow up with HTN clinic as needed.  Lewis Keats Rodriguez-Guzman PharmD, BCPS, CPP [facility] [address]  [place] [id number] 01/14/2018 4:50 PM

## 2018-01-14 NOTE — Patient Instructions (Signed)
Return for a  follow up appointment as needed  Check your blood pressure at home daily (if able) and keep record of the readings.  Take your BP meds as follows: *START furosemide  daily as needed for fluid retention*   Call clinic if you suddenly gain more than 2-3 lb (0.9-1.4 kg) in a day, or more than 5 lb (2.3 kg) in one week. This amount may be more or less depending on your condition.   Exercise as you're able, try to walk approximately 30 minutes per day.  Keep salt intake to a minimum, especially watch canned and prepared boxed foods.  Eat more fresh fruits and vegetables and fewer canned items.  Avoid eating in fast food restaurants.    HOW TO TAKE YOUR BLOOD PRESSURE: . Rest 5 minutes before taking your blood pressure. .  Don't smoke or drink caffeinated beverages for at least 30 minutes before. . Take your blood pressure before (not after) you eat. . Sit comfortably with your back supported and both feet on the floor (don't cross your legs). . Elevate your arm to heart level on a table or a desk. . Use the proper sized cuff. It should fit smoothly and snugly around your bare upper arm. There should be enough room to slip a fingertip under the cuff. The bottom edge of the cuff should be 1 inch above the crease of the elbow. . Ideally, take 3 measurements at one sitting and record the average.

## 2018-01-15 LAB — BASIC METABOLIC PANEL
BUN/Creatinine Ratio: 18 (ref 10–24)
BUN: 19 mg/dL (ref 8–27)
CO2: 22 mmol/L (ref 20–29)
Calcium: 9.4 mg/dL (ref 8.6–10.2)
Chloride: 102 mmol/L (ref 96–106)
Creatinine, Ser: 1.03 mg/dL (ref 0.76–1.27)
GFR calc Af Amer: 90 mL/min/{1.73_m2} (ref >59)
GFR calc non Af Amer: 78 mL/min/{1.73_m2} (ref >59)
Glucose: 147 mg/dL — ABNORMAL HIGH (ref 65–99)
Potassium: 4.2 mmol/L (ref 3.5–5.2)
Sodium: 144 mmol/L (ref 134–144)

## 2018-01-17 ENCOUNTER — Telehealth: Payer: Self-pay | Admitting: Pharmacist

## 2018-01-17 NOTE — Telephone Encounter (Signed)
Patient informer his renal function and electrolytes are stable. Okay to continue current medication without changes.

## 2018-02-04 ENCOUNTER — Encounter: Payer: Self-pay | Admitting: Interventional Cardiology

## 2018-02-17 ENCOUNTER — Other Ambulatory Visit: Payer: Self-pay | Admitting: Primary Care

## 2018-02-17 DIAGNOSIS — E7849 Other hyperlipidemia: Secondary | ICD-10-CM

## 2018-02-17 DIAGNOSIS — R7303 Prediabetes: Secondary | ICD-10-CM

## 2018-03-02 ENCOUNTER — Other Ambulatory Visit: Payer: Self-pay | Admitting: Primary Care

## 2018-03-02 DIAGNOSIS — E785 Hyperlipidemia, unspecified: Secondary | ICD-10-CM

## 2018-03-04 ENCOUNTER — Other Ambulatory Visit (INDEPENDENT_AMBULATORY_CARE_PROVIDER_SITE_OTHER): Payer: BLUE CROSS/BLUE SHIELD

## 2018-03-04 DIAGNOSIS — E7849 Other hyperlipidemia: Secondary | ICD-10-CM

## 2018-03-04 DIAGNOSIS — R7303 Prediabetes: Secondary | ICD-10-CM | POA: Diagnosis not present

## 2018-03-04 LAB — LDL CHOLESTEROL, DIRECT: Direct LDL: 52 mg/dL

## 2018-03-04 LAB — LIPID PANEL
Cholesterol: 116 mg/dL (ref 0–200)
HDL: 27.1 mg/dL — ABNORMAL LOW (ref >39.00)
NonHDL: 88.86
Total CHOL/HDL Ratio: 4
Triglycerides: 206 mg/dL — ABNORMAL HIGH (ref 0.0–149.0)
VLDL: 41.2 mg/dL — ABNORMAL HIGH (ref 0.0–40.0)

## 2018-03-04 LAB — HEPATIC FUNCTION PANEL
ALT: 14 U/L (ref 0–53)
AST: 19 U/L (ref 0–37)
Albumin: 4.1 g/dL (ref 3.5–5.2)
Alkaline Phosphatase: 33 U/L — ABNORMAL LOW (ref 39–117)
Bilirubin, Direct: 0.1 mg/dL (ref 0.0–0.3)
Total Bilirubin: 0.7 mg/dL (ref 0.2–1.2)
Total Protein: 6.7 g/dL (ref 6.0–8.3)

## 2018-03-04 LAB — HEMOGLOBIN A1C: Hgb A1c MFr Bld: 7.2 % — ABNORMAL HIGH (ref 4.6–6.5)

## 2018-03-05 ENCOUNTER — Other Ambulatory Visit: Payer: Self-pay | Admitting: Interventional Cardiology

## 2018-03-07 ENCOUNTER — Encounter: Payer: Self-pay | Admitting: Interventional Cardiology

## 2018-03-07 ENCOUNTER — Other Ambulatory Visit: Payer: Self-pay | Admitting: *Deleted

## 2018-03-07 NOTE — Progress Notes (Signed)
error 

## 2018-03-11 ENCOUNTER — Ambulatory Visit (INDEPENDENT_AMBULATORY_CARE_PROVIDER_SITE_OTHER): Payer: BLUE CROSS/BLUE SHIELD | Admitting: Primary Care

## 2018-03-11 ENCOUNTER — Encounter: Payer: Self-pay | Admitting: Primary Care

## 2018-03-11 VITALS — BP 124/72 | HR 85 | Temp 98.1°F | Ht 73.0 in | Wt 262.5 lb

## 2018-03-11 DIAGNOSIS — E119 Type 2 diabetes mellitus without complications: Secondary | ICD-10-CM | POA: Diagnosis not present

## 2018-03-11 DIAGNOSIS — E7849 Other hyperlipidemia: Secondary | ICD-10-CM

## 2018-03-11 DIAGNOSIS — Z0000 Encounter for general adult medical examination without abnormal findings: Secondary | ICD-10-CM | POA: Diagnosis not present

## 2018-03-11 DIAGNOSIS — I6300 Cerebral infarction due to thrombosis of unspecified precerebral artery: Secondary | ICD-10-CM | POA: Diagnosis not present

## 2018-03-11 DIAGNOSIS — I5042 Chronic combined systolic (congestive) and diastolic (congestive) heart failure: Secondary | ICD-10-CM | POA: Diagnosis not present

## 2018-03-11 DIAGNOSIS — I10 Essential (primary) hypertension: Secondary | ICD-10-CM | POA: Diagnosis not present

## 2018-03-11 DIAGNOSIS — F411 Generalized anxiety disorder: Secondary | ICD-10-CM

## 2018-03-11 MED ORDER — METFORMIN HCL 500 MG PO TABS: 500.0000 mg | ORAL_TABLET | Freq: Two times a day (BID) | ORAL | 3 refills | Status: DC

## 2018-03-11 MED ORDER — ESCITALOPRAM OXALATE 10 MG PO TABS: 10.0000 mg | ORAL_TABLET | Freq: Every day | ORAL | 3 refills | Status: DC

## 2018-03-11 NOTE — Assessment & Plan Note (Signed)
Recent A1C of 7.2 which is a change from 6.3 six months ago. Discussed diagnosis of diabetes and the need to lose weight through diet and exercise.  Rx for Metformin sent to pharmacy, discussed directions on use.   Will provide pneumonia vaccination this Fall with flu shot as he declines today. Foot exam next visit. He follows regularly with ophthalmologist. Managed on statin and ARB.   Follow up in 3 months for re-evaluation.

## 2018-03-11 NOTE — Assessment & Plan Note (Signed)
Stable in the office today, continue current regimen. Recent BMP reviewed.  

## 2018-03-11 NOTE — Assessment & Plan Note (Signed)
Pneumonia vaccination due, he kindly defers until Fall. Td is UTD, flu due this Fall. Colon cancer screening overdue, he opts for cologuard. PSA is UTD. Discussed the importance of a healthy diet and regular exercise in order for weight loss, and to reduce the risk of any potential medical problems. Exam unremarkable. Labs reviewed. Follow up in 1 year for CPE.

## 2018-03-11 NOTE — Assessment & Plan Note (Signed)
LDL at goal, trigs elevated. Discussed to work on diet and start some form of exercise.

## 2018-03-11 NOTE — Assessment & Plan Note (Signed)
Doing well on Lexapro, continues to notice a benefit. Continue same, refills sent to pharmacy.

## 2018-03-11 NOTE — Progress Notes (Signed)
Subjective:    Patient ID: Jason Melendez, male    DOB: 08/07/1956, 62 y.o.   MRN: [id number]  HPI  Jason Melendez is a 62 year old male who presents today for complete physical.  Immunizations: -Tetanus: Completed in 2016 -Influenza: Completed last season  Diet: He endorses a fair diet. Breakfast: Boiled eggs, [place], sometimes leftover pasta or chicken/burger patty Lunch: Chicken/turkey sandwich, leftovers (chicken/turkey) sometimes pizza Dinner: Meat, vegetables, pasta, bread; restaurants three times weekly  Snacks: Sometimes, Candy Desserts: Doughnuts, candy. Daily.  Beverages: Diet soda, flavored water  Exercise: He is not currently exercising  Eye exam: Completes annually Dental exam: Completes annually Colonoscopy: Never completed, opts for cologuard PSA: Normal in 2018 Hep C Screen: Negative in 2016  Wt Readings from Last 3 Encounters:  03/11/18 262 lb 8 oz (119.1 kg)  01/14/18 264 lb 12.8 oz (120.1 kg)  12/30/17 267 lb (121.1 kg)     Review of Systems  Constitutional: Negative for unexpected weight change.  HENT: Negative for rhinorrhea.   Respiratory: Negative for cough and shortness of breath.   Cardiovascular: Negative for chest pain.  Gastrointestinal: Negative for constipation and diarrhea.  Genitourinary: Negative for difficulty urinating.  Musculoskeletal: Positive for arthralgias and back pain.       Chronic bilateral knee pain  Skin: Negative for rash.  Allergic/Immunologic: Negative for environmental allergies.  Neurological: Negative for dizziness, numbness and headaches.  Psychiatric/Behavioral:       Feels well managed on Lexapro       Past Medical History:  Diagnosis Date  . HTN (hypertension)   . Hyperlipidemia      Social History   Socioeconomic History  . Marital status: Married    Spouse name: Not on file  . Number of children: Not on file  . Years of education: Not on file  . Highest education level: Not on file    Occupational History  . Not on file  Social Needs  . Financial resource strain: Not on file  . Food insecurity:    Worry: Not on file    Inability: Not on file  . Transportation needs:    Medical: Not on file    Non-medical: Not on file  Tobacco Use  . Smoking status: Never Smoker  . Smokeless tobacco: Never Used  Substance and Sexual Activity  . Alcohol use: No    Alcohol/week: 0.0 oz  . Drug use: No  . Sexual activity: Not Currently  Lifestyle  . Physical activity:    Days per week: Not on file    Minutes per session: Not on file  . Stress: Not on file  Relationships  . Social connections:    Talks on phone: Not on file    Gets together: Not on file    Attends religious service: Not on file    Active member of club or organization: Not on file    Attends meetings of clubs or organizations: Not on file    Relationship status: Not on file  . Intimate partner violence:    Fear of current or ex partner: Not on file    Emotionally abused: Not on file    Physically abused: Not on file    Forced sexual activity: Not on file  Other Topics Concern  . Not on file  Social History Narrative   Marital status: married      Children: 2 daughters      Lives:  With wife  Employment: printing work      Tobacco: none      Alcohol: one beer every three months      Drugs:  None      Exercise:  No formal exercise; walks up three flights of stairs several times per day at work.   Lives at home.  Works for [employer].     2 yrs college.      Past Surgical History:  Procedure Laterality Date  . ENDARTERECTOMY Left 10/16/2016   Procedure: ENDARTERECTOMY CAROTID;  Surgeon: Nada LibmanVance W Brabham, MD;  [facility];  Service: Vascular;  Laterality: Left;  . HERNIA REPAIR    . PATCH ANGIOPLASTY Left 10/16/2016   Procedure: PATCH ANGIOPLASTY USING Livia SnellenXENOSURE BIOLOGIC PATCH;  Surgeon: Nada LibmanVance W Brabham, MD;  [facility];  Service: Vascular;  Laterality: Left;    Family  History  Problem Relation Age of Onset  . Diabetes Mother   . Heart disease Father [id number]       CABG  . Diabetes Father   . Kidney disease Father   . Kidney failure Father   . Hypertension Sister     Allergies  Allergen Reactions  . Hydralazine Hcl     Sudden drop in BP    Current Outpatient Medications on File Prior to Visit  Medication Sig Dispense Refill  . acetaminophen (TYLENOL 8 HOUR ARTHRITIS PAIN) 650 MG CR tablet Take 650 mg by mouth 2 (two) times daily.    Marland Kitchen. aspirin 325 MG tablet Take 1 tablet (325 mg total) by mouth daily. 60 tablet 0  . atorvastatin (LIPITOR) 40 MG tablet TAKE 1 TABLET BY MOUTH DAILY AT 6 PM. 90 tablet 1  . carvedilol (COREG) 6.25 MG tablet Take 1 tablet (6.25 mg total) by mouth 2 (two) times daily with a meal. 180 tablet 2  . furosemide (LASIX) 20 MG tablet Take 1 tablet (20 mg total) by mouth daily as needed for fluid. 30 tablet 1  . losartan-hydrochlorothiazide (HYZAAR) 100-25 MG tablet Take 1 tablet by mouth daily. 90 tablet 3  . spironolactone (ALDACTONE) 25 MG tablet Take 1 tablet (25 mg total) by mouth daily. 30 tablet 11   No current facility-administered medications on file prior to visit.     BP 124/72   Pulse 85   Temp 98.1 F (36.7 C) (Oral)   Ht 6\' 1"  (1.854 m)   Wt 262 lb 8 oz (119.1 kg)   SpO2 95%   BMI 34.63 kg/m    Objective:   Physical Exam  Constitutional: He is oriented to person, place, and time. He appears well-nourished.  HENT:  Mouth/Throat: No oropharyngeal exudate.  Eyes: Pupils are equal, round, and reactive to light. EOM are normal.  Neck: Neck supple. No thyromegaly present.  Cardiovascular: Normal rate and regular rhythm.  Respiratory: Effort normal and breath sounds normal.  GI: Soft. Bowel sounds are normal. There is no tenderness.  Musculoskeletal:  Generalized decrease in ROM to bilateral knees. 5/5/ strength to bilateral lower extremities.   Neurological: He is alert and oriented to person, place, and  time.  Skin: Skin is warm and dry.  Psychiatric: He has a normal mood and affect.           Assessment & Plan:

## 2018-03-11 NOTE — Assessment & Plan Note (Signed)
No new symptoms, neuro exam unremarkable. Continue aspirin and statin.

## 2018-03-11 NOTE — Patient Instructions (Signed)
Start metformin 500 mg tablets for diabetes. Take 1 tablet by mouth once daily with breakfast for 2 weeks, then increase to 1 tablet twice daily (breakfast and dinner) thereafter.  Reduce consumption of candy, pasta, bread, etc.   Increase fresh fruit, vegetables, lean protein. Choose whole grains like brown rice, quinoa, couscous, etc.   Ensure you are consuming 64 ounces of water daily.  Start exercising. You should be getting 150 minutes of exercise weekly.  Please schedule a follow up appointment in 3 months for diabetes check.  It was a pleasure to see you today!   Preventive Care 40-64 Years, Male Preventive care refers to lifestyle choices and visits with your health care provider that can promote health and wellness. What does preventive care include?  A yearly physical exam. This is also called an annual well check.  Dental exams once or twice a year.  Routine eye exams. Ask your health care provider how often you should have your eyes checked.  Personal lifestyle choices, including: ? Daily care of your teeth and gums. ? Regular physical activity. ? Eating a healthy diet. ? Avoiding tobacco and drug use. ? Limiting alcohol use. ? Practicing safe sex. ? Taking low-dose aspirin every day starting at age 54. What happens during an annual well check? The services and screenings done by your health care provider during your annual well check will depend on your age, overall health, lifestyle risk factors, and family history of disease. Counseling Your health care provider may ask you questions about your:  Alcohol use.  Tobacco use.  Drug use.  Emotional well-being.  Home and relationship well-being.  Sexual activity.  Eating habits.  Work and work Statistician.  Screening You may have the following tests or measurements:  Height, weight, and BMI.  Blood pressure.  Lipid and cholesterol levels. These may be checked every 5 years, or more frequently if  you are over 67 years old.  Skin check.  Lung cancer screening. You may have this screening every year starting at age 47 if you have a 30-pack-year history of smoking and currently smoke or have quit within the past 15 years.  Fecal occult blood test (FOBT) of the stool. You may have this test every year starting at age 26.  Flexible sigmoidoscopy or colonoscopy. You may have a sigmoidoscopy every 5 years or a colonoscopy every 10 years starting at age 45.  Prostate cancer screening. Recommendations will vary depending on your family history and other risks.  Hepatitis C blood test.  Hepatitis B blood test.  Sexually transmitted disease (STD) testing.  Diabetes screening. This is done by checking your blood sugar (glucose) after you have not eaten for a while (fasting). You may have this done every 1-3 years.  Discuss your test results, treatment options, and if necessary, the need for more tests with your health care provider. Vaccines Your health care provider may recommend certain vaccines, such as:  Influenza vaccine. This is recommended every year.  Tetanus, diphtheria, and acellular pertussis (Tdap, Td) vaccine. You may need a Td booster every 10 years.  Varicella vaccine. You may need this if you have not been vaccinated.  Zoster vaccine. You may need this after age 47.  Measles, mumps, and rubella (MMR) vaccine. You may need at least one dose of MMR if you were born in 1957 or later. You may also need a second dose.  Pneumococcal 13-valent conjugate (PCV13) vaccine. You may need this if you have certain conditions and have  not been vaccinated.  Pneumococcal polysaccharide (PPSV23) vaccine. You may need one or two doses if you smoke cigarettes or if you have certain conditions.  Meningococcal vaccine. You may need this if you have certain conditions.  Hepatitis A vaccine. You may need this if you have certain conditions or if you travel or work in places where you may  be exposed to hepatitis A.  Hepatitis B vaccine. You may need this if you have certain conditions or if you travel or work in places where you may be exposed to hepatitis B.  Haemophilus influenzae type b (Hib) vaccine. You may need this if you have certain risk factors.  Talk to your health care provider about which screenings and vaccines you need and how often you need them. This information is not intended to replace advice given to you by your health care provider. Make sure you discuss any questions you have with your health care provider. Document Released: 09/13/2015 Document Revised: 05/06/2016 Document Reviewed: 06/18/2015 Elsevier Interactive Patient Education  [employer].

## 2018-03-11 NOTE — Assessment & Plan Note (Signed)
Compliant to Lasix three times weekly, weight has been stable. Following with cardiology. Continue beta blocker and BP control.

## 2018-03-27 ENCOUNTER — Encounter: Payer: Self-pay | Admitting: Primary Care

## 2018-03-27 DIAGNOSIS — Z1212 Encounter for screening for malignant neoplasm of rectum: Secondary | ICD-10-CM | POA: Diagnosis not present

## 2018-03-27 DIAGNOSIS — Z1211 Encounter for screening for malignant neoplasm of colon: Secondary | ICD-10-CM | POA: Diagnosis not present

## 2018-03-27 LAB — COLOGUARD: Cologuard: NEGATIVE

## 2018-03-30 ENCOUNTER — Emergency Department (HOSPITAL_COMMUNITY): Payer: BLUE CROSS/BLUE SHIELD

## 2018-03-30 ENCOUNTER — Observation Stay (HOSPITAL_COMMUNITY)
Admission: EM | Admit: 2018-03-30 | Discharge: 2018-03-31 | Disposition: A | Discharge status: Home / Self Care | Payer: BLUE CROSS/BLUE SHIELD | Attending: Cardiology | Admitting: Cardiology

## 2018-03-30 DIAGNOSIS — I6522 Occlusion and stenosis of left carotid artery: Secondary | ICD-10-CM | POA: Diagnosis not present

## 2018-03-30 DIAGNOSIS — E119 Type 2 diabetes mellitus without complications: Secondary | ICD-10-CM

## 2018-03-30 DIAGNOSIS — I499 Cardiac arrhythmia, unspecified: Secondary | ICD-10-CM | POA: Diagnosis not present

## 2018-03-30 DIAGNOSIS — R4020 Unspecified coma: Secondary | ICD-10-CM | POA: Diagnosis not present

## 2018-03-30 DIAGNOSIS — I110 Hypertensive heart disease with heart failure: Secondary | ICD-10-CM | POA: Diagnosis not present

## 2018-03-30 DIAGNOSIS — R55 Syncope and collapse: Principal | ICD-10-CM | POA: Diagnosis present

## 2018-03-30 DIAGNOSIS — I5042 Chronic combined systolic (congestive) and diastolic (congestive) heart failure: Secondary | ICD-10-CM | POA: Diagnosis present

## 2018-03-30 DIAGNOSIS — I700 Atherosclerosis of aorta: Secondary | ICD-10-CM | POA: Diagnosis not present

## 2018-03-30 DIAGNOSIS — I639 Cerebral infarction, unspecified: Secondary | ICD-10-CM | POA: Diagnosis present

## 2018-03-30 DIAGNOSIS — I2510 Atherosclerotic heart disease of native coronary artery without angina pectoris: Secondary | ICD-10-CM | POA: Diagnosis not present

## 2018-03-30 DIAGNOSIS — F411 Generalized anxiety disorder: Secondary | ICD-10-CM | POA: Insufficient documentation

## 2018-03-30 DIAGNOSIS — Z79899 Other long term (current) drug therapy: Secondary | ICD-10-CM | POA: Diagnosis not present

## 2018-03-30 DIAGNOSIS — Z7982 Long term (current) use of aspirin: Secondary | ICD-10-CM | POA: Insufficient documentation

## 2018-03-30 DIAGNOSIS — Z7984 Long term (current) use of oral hypoglycemic drugs: Secondary | ICD-10-CM | POA: Diagnosis not present

## 2018-03-30 DIAGNOSIS — Z888 Allergy status to other drugs, medicaments and biological substances status: Secondary | ICD-10-CM | POA: Insufficient documentation

## 2018-03-30 DIAGNOSIS — I493 Ventricular premature depolarization: Secondary | ICD-10-CM | POA: Diagnosis not present

## 2018-03-30 DIAGNOSIS — E669 Obesity, unspecified: Secondary | ICD-10-CM | POA: Diagnosis not present

## 2018-03-30 DIAGNOSIS — E785 Hyperlipidemia, unspecified: Secondary | ICD-10-CM | POA: Diagnosis present

## 2018-03-30 DIAGNOSIS — I498 Other specified cardiac arrhythmias: Secondary | ICD-10-CM

## 2018-03-30 DIAGNOSIS — Z6834 Body mass index (BMI) 34.0-34.9, adult: Secondary | ICD-10-CM | POA: Diagnosis not present

## 2018-03-30 DIAGNOSIS — R008 Other abnormalities of heart beat: Secondary | ICD-10-CM | POA: Diagnosis not present

## 2018-03-30 DIAGNOSIS — I429 Cardiomyopathy, unspecified: Secondary | ICD-10-CM | POA: Diagnosis not present

## 2018-03-30 DIAGNOSIS — Z8673 Personal history of transient ischemic attack (TIA), and cerebral infarction without residual deficits: Secondary | ICD-10-CM | POA: Diagnosis present

## 2018-03-30 DIAGNOSIS — E860 Dehydration: Secondary | ICD-10-CM

## 2018-03-30 DIAGNOSIS — I10 Essential (primary) hypertension: Secondary | ICD-10-CM | POA: Diagnosis present

## 2018-03-30 DIAGNOSIS — I491 Atrial premature depolarization: Secondary | ICD-10-CM | POA: Diagnosis not present

## 2018-03-30 HISTORY — DX: Syncope and collapse: R55

## 2018-03-30 LAB — COMPREHENSIVE METABOLIC PANEL
ALT: 16 U/L (ref 0–44)
AST: 23 U/L (ref 15–41)
Albumin: 4.2 g/dL (ref 3.5–5.0)
Alkaline Phosphatase: 33 U/L — ABNORMAL LOW (ref 38–126)
Anion gap: 8 (ref 5–15)
BUN: 30 mg/dL — ABNORMAL HIGH (ref 8–23)
CO2: 25 mmol/L (ref 22–32)
Calcium: 9.6 mg/dL (ref 8.9–10.3)
Chloride: 107 mmol/L (ref 98–111)
Creatinine, Ser: 1.44 mg/dL — ABNORMAL HIGH (ref 0.61–1.24)
GFR calc Af Amer: 59 mL/min — ABNORMAL LOW (ref >60)
GFR calc non Af Amer: 51 mL/min — ABNORMAL LOW (ref >60)
Glucose, Bld: 134 mg/dL — ABNORMAL HIGH (ref 70–99)
Potassium: 4.5 mmol/L (ref 3.5–5.1)
Sodium: 140 mmol/L (ref 135–145)
Total Bilirubin: 1 mg/dL (ref 0.3–1.2)
Total Protein: 7 g/dL (ref 6.5–8.1)

## 2018-03-30 LAB — CBC WITH DIFFERENTIAL/PLATELET
Abs Immature Granulocytes: 0 10*3/uL (ref 0.0–0.1)
Basophils Absolute: 0 10*3/uL (ref 0.0–0.1)
Basophils Relative: 1 %
Eosinophils Absolute: 0.1 10*3/uL (ref 0.0–0.7)
Eosinophils Relative: 1 %
HCT: 40 % (ref 39.0–52.0)
Hemoglobin: 12.9 g/dL — ABNORMAL LOW (ref 13.0–17.0)
Immature Granulocytes: 0 %
Lymphocytes Relative: 20 %
Lymphs Abs: 1.7 10*3/uL (ref 0.7–4.0)
MCH: 31 pg (ref 26.0–34.0)
MCHC: 32.3 g/dL (ref 30.0–36.0)
MCV: 96.2 fL (ref 78.0–100.0)
Monocytes Absolute: 0.5 10*3/uL (ref 0.1–1.0)
Monocytes Relative: 6 %
Neutro Abs: 6.2 10*3/uL (ref 1.7–7.7)
Neutrophils Relative %: 72 %
Platelets: 137 10*3/uL — ABNORMAL LOW (ref 150–400)
RBC: 4.16 MIL/uL — ABNORMAL LOW (ref 4.22–5.81)
RDW: 12.5 % (ref 11.5–15.5)
WBC: 8.5 10*3/uL (ref 4.0–10.5)

## 2018-03-30 LAB — MAGNESIUM: Magnesium: 2.1 mg/dL (ref 1.7–2.4)

## 2018-03-30 LAB — I-STAT TROPONIN, ED
Troponin i, poc: 0 ng/mL (ref 0.00–0.08)
Troponin i, poc: 0 ng/mL (ref 0.00–0.08)
Troponin i, poc: 0 ng/mL (ref 0.00–0.08)

## 2018-03-30 LAB — BRAIN NATRIURETIC PEPTIDE: B Natriuretic Peptide: 29 pg/mL (ref 0.0–100.0)

## 2018-03-30 LAB — GLUCOSE, CAPILLARY: Glucose-Capillary: 122 mg/dL — ABNORMAL HIGH (ref 70–99)

## 2018-03-30 MED ORDER — ACETAMINOPHEN 325 MG PO TABS
650.0000 mg | ORAL_TABLET | Freq: Two times a day (BID) | ORAL | Status: DC
  Administered 2018-03-30 – 2018-03-31 (×2): 650 mg via ORAL
  Filled 2018-03-30 (×2): qty 2

## 2018-03-30 MED ORDER — ATORVASTATIN CALCIUM 40 MG PO TABS
40.0000 mg | ORAL_TABLET | Freq: Every day | ORAL | Status: DC
  Administered 2018-03-30: 40 mg via ORAL
  Filled 2018-03-30: qty 1

## 2018-03-30 MED ORDER — SODIUM CHLORIDE 0.9 % IV BOLUS
1000.0000 mL | Freq: Once | INTRAVENOUS | Status: AC
  Administered 2018-03-30: 1000 mL via INTRAVENOUS

## 2018-03-30 MED ORDER — ASPIRIN 325 MG PO TABS
325.0000 mg | ORAL_TABLET | Freq: Every day | ORAL | Status: DC
  Administered 2018-03-31: 325 mg via ORAL
  Filled 2018-03-30: qty 1

## 2018-03-30 MED ORDER — INSULIN ASPART 100 UNIT/ML ~~LOC~~ SOLN: 0.0000 [IU] | Freq: Every day | SUBCUTANEOUS | Status: DC

## 2018-03-30 MED ORDER — CARVEDILOL 12.5 MG PO TABS
12.5000 mg | ORAL_TABLET | Freq: Two times a day (BID) | ORAL | Status: DC
  Administered 2018-03-30 – 2018-03-31 (×2): 12.5 mg via ORAL
  Filled 2018-03-30 (×2): qty 1

## 2018-03-30 MED ORDER — HEPARIN SODIUM (PORCINE) 5000 UNIT/ML IJ SOLN
5000.0000 [IU] | Freq: Three times a day (TID) | INTRAMUSCULAR | Status: DC
  Administered 2018-03-30 – 2018-03-31 (×2): 5000 [IU] via SUBCUTANEOUS
  Filled 2018-03-30 (×2): qty 1

## 2018-03-30 MED ORDER — METFORMIN HCL 500 MG PO TABS
500.0000 mg | ORAL_TABLET | Freq: Two times a day (BID) | ORAL | Status: DC
  Administered 2018-03-30 – 2018-03-31 (×2): 500 mg via ORAL
  Filled 2018-03-30 (×2): qty 1

## 2018-03-30 MED ORDER — INSULIN ASPART 100 UNIT/ML ~~LOC~~ SOLN: 0.0000 [IU] | Freq: Three times a day (TID) | SUBCUTANEOUS | Status: DC

## 2018-03-30 MED ORDER — LOSARTAN POTASSIUM 50 MG PO TABS
100.0000 mg | ORAL_TABLET | Freq: Every day | ORAL | Status: DC
  Administered 2018-03-31: 100 mg via ORAL
  Filled 2018-03-30: qty 2

## 2018-03-30 MED ORDER — LACTATED RINGERS IV BOLUS
1000.0000 mL | Freq: Once | INTRAVENOUS | Status: AC
  Administered 2018-03-30: 1000 mL via INTRAVENOUS

## 2018-03-30 MED ORDER — ESCITALOPRAM OXALATE 10 MG PO TABS
10.0000 mg | ORAL_TABLET | Freq: Every day | ORAL | Status: DC
  Administered 2018-03-30: 10 mg via ORAL
  Filled 2018-03-30 (×2): qty 1

## 2018-03-30 MED ORDER — SODIUM CHLORIDE 0.9% FLUSH
3.0000 mL | Freq: Two times a day (BID) | INTRAVENOUS | Status: DC
  Administered 2018-03-30: 10 mL via INTRAVENOUS
  Administered 2018-03-31: 3 mL via INTRAVENOUS

## 2018-03-30 NOTE — ED Provider Notes (Signed)
[facility] EMERGENCY DEPARTMENT Provider Note   CSN: [id number] Arrival date & time: 03/30/18  1337     History   Chief Complaint Chief Complaint  Patient presents with  . Loss of Consciousness    HPI Jason Melendez is a 62 y.o. male.  The history is provided by the patient, the spouse, a friend and medical records. No language interpreter was used.  Loss of Consciousness   This is a recurrent problem. The current episode started less than 1 hour ago. The problem occurs constantly. The problem has been resolved. He lost consciousness for a period of 1 to 5 minutes. The problem is associated with exertion. Associated symptoms include diaphoresis and light-headedness. Pertinent negatives include abdominal pain, back pain, chest pain, congestion, dizziness, fever, focal sensory loss, focal weakness, headaches, malaise/fatigue, nausea, palpitations, seizures, vertigo, vomiting and weakness. He has tried nothing for the symptoms. The treatment provided no relief. His past medical history is significant for CVA, DM and HTN.    Past Medical History:  Diagnosis Date  . HTN (hypertension)   . Hyperlipidemia     Patient Active Problem List   Diagnosis Date Noted  . Hypertension 12/30/2017  . Chronic combined systolic and diastolic heart failure (HCC) 11/16/2017  . Preventative health care 01/22/2017  . Neurocardiogenic syncope 11/12/2016  . Type 2 diabetes mellitus (HCC) 10/27/2016  . GAD (generalized anxiety disorder) 10/27/2016  . Stenosis of left carotid artery   . CVA (cerebral vascular accident) (HCC) 10/13/2016  . Hypertensive heart disease without CHF 10/13/2016  . HLD (hyperlipidemia) 10/13/2016  . Obesity (BMI 30.0-34.9)     Past Surgical History:  Procedure Laterality Date  . ENDARTERECTOMY Left 10/16/2016   Procedure: ENDARTERECTOMY CAROTID;  Surgeon: Nada Libman, MD;  Location: [facility] OR;  Service: Vascular;  Laterality: Left;  . HERNIA  REPAIR    . PATCH ANGIOPLASTY Left 10/16/2016   Procedure: PATCH ANGIOPLASTY USING Livia Snellen BIOLOGIC PATCH;  Surgeon: Nada Libman, MD;  Location: [facility] OR;  Service: Vascular;  Laterality: Left;        Home Medications    Prior to Admission medications   Medication Sig Start Date End Date Taking? Authorizing Provider  acetaminophen (TYLENOL 8 HOUR ARTHRITIS PAIN) 650 MG CR tablet Take 650 mg by mouth 2 (two) times daily.    [provider]  aspirin 325 MG tablet Take 1 tablet (325 mg total) by mouth daily. 10/19/16   Elgergawy, Leana Roe, MD  atorvastatin (LIPITOR) 40 MG tablet TAKE 1 TABLET BY MOUTH DAILY AT 6 PM. 03/02/18   Doreene Nest, NP  carvedilol (COREG) 6.25 MG tablet Take 1 tablet (6.25 mg total) by mouth 2 (two) times daily with a meal. 11/08/17   Lyn Records, MD  escitalopram (LEXAPRO) 10 MG tablet Take 1 tablet (10 mg total) by mouth daily. 03/11/18   Doreene Nest, NP  furosemide (LASIX) 20 MG tablet Take 1 tablet (20 mg total) by mouth daily as needed for fluid. 01/14/18 04/14/18  Lyn Records, MD  losartan-hydrochlorothiazide (HYZAAR) 100-25 MG tablet Take 1 tablet by mouth daily. 11/17/17   Lyn Records, MD  metFORMIN (GLUCOPHAGE) 500 MG tablet Take 1 tablet (500 mg total) by mouth 2 (two) times daily with a meal. 03/11/18   Doreene Nest, NP  spironolactone (ALDACTONE) 25 MG tablet Take 1 tablet (25 mg total) by mouth daily. 12/31/17 03/31/18  Lyn Records, MD    Family History Family  History  Problem Relation Age of Onset  . Diabetes Mother   . Heart disease Father 35       CABG  . Diabetes Father   . Kidney disease Father   . Kidney failure Father   . Hypertension Sister     Social History Social History   Tobacco Use  . Smoking status: Never Smoker  . Smokeless tobacco: Never Used  Substance Use Topics  . Alcohol use: No    Alcohol/week: 0.0 oz  . Drug use: No     Allergies   Hydralazine hcl   Review of Systems Review of  Systems  Constitutional: Positive for diaphoresis. Negative for chills, fatigue, fever and malaise/fatigue.  HENT: Negative for congestion.   Eyes: Negative for visual disturbance.  Respiratory: Negative for cough, chest tightness, shortness of breath and stridor.   Cardiovascular: Positive for syncope. Negative for chest pain and palpitations.  Gastrointestinal: Positive for diarrhea (mild). Negative for abdominal pain, constipation, nausea and vomiting.  Genitourinary: Negative for flank pain.  Musculoskeletal: Negative for back pain and neck stiffness.  Skin: Negative for rash and wound.  Neurological: Positive for syncope and light-headedness. Negative for dizziness, vertigo, focal weakness, seizures, weakness, numbness and headaches.  Psychiatric/Behavioral: Negative for agitation.  All other systems reviewed and are negative.    Physical Exam Updated Vital Signs BP 112/60 (BP Location: Right Arm)   Pulse 66   Temp 97.8 F (36.6 C) (Oral)   Resp 17   SpO2 99%   Physical Exam  Constitutional: He is oriented to person, place, and time. He appears well-developed and well-nourished. No distress.  HENT:  Head: Normocephalic.  Nose: Nose normal.  Mouth/Throat: Oropharynx is clear and moist. No oropharyngeal exudate.  Eyes: Pupils are equal, round, and reactive to light. Conjunctivae and EOM are normal.  Neck: Normal range of motion. Neck supple.  Cardiovascular: Normal rate.  Murmur heard. Pulmonary/Chest: Effort normal and breath sounds normal. No stridor. No respiratory distress. He has no wheezes. He exhibits no tenderness.  Abdominal: He exhibits no distension. There is no tenderness. There is no rebound.  Musculoskeletal: He exhibits no edema or tenderness.  Neurological: He is alert and oriented to person, place, and time. No cranial nerve deficit or sensory deficit. He exhibits normal muscle tone. Coordination normal.  Skin: Skin is warm. Capillary refill takes less than  2 seconds. He is not diaphoretic. No erythema.  Psychiatric: He has a normal mood and affect.  Nursing note and vitals reviewed.    ED Treatments / Results  Labs (all labs ordered are listed, but only abnormal results are displayed) Labs Reviewed  CBC WITH DIFFERENTIAL/PLATELET - Abnormal; Notable for the following components:      Result Value   RBC 4.16 (*)    Hemoglobin 12.9 (*)    Platelets 137 (*)    All other components within normal limits  COMPREHENSIVE METABOLIC PANEL - Abnormal; Notable for the following components:   Glucose, Bld 134 (*)    BUN 30 (*)    Creatinine, Ser 1.44 (*)    Alkaline Phosphatase 33 (*)    GFR calc non Af Amer 51 (*)    GFR calc Af Amer 59 (*)    All other components within normal limits  MAGNESIUM  BRAIN NATRIURETIC PEPTIDE  I-STAT TROPONIN, ED    EKG EKG Interpretation  Date/Time:  Wednesday March 30 2018 14:36:09 EDT Ventricular Rate:  83 PR Interval:    QRS Duration: 103 QT Interval:  387 QTC Calculation: 377 R Axis:   63 Text Interpretation:  Sinus rhythm Ventricular bigeminy When compared to prior, mo bigeminy.  no STEMI Confirmed by Theda Belfastegeler, Chris ([id number]) on 03/30/2018 2:53:22 PM   Radiology Dg Chest 2 View  Result Date: 03/30/2018 CLINICAL DATA:  Syncopal episode of the golf [place] today possibly due to becoming overheated. No other symptoms. History of hypertension, hyperlipidemia, previous CVA. EXAM: CHEST - 2 VIEW COMPARISON:  PA and lateral chest x-ray of August 11, 2017 FINDINGS: The lungs are well-expanded and clear. The heart and pulmonary vascularity are normal. The mediastinum is normal in width. There is no pleural effusion. There is calcification in the wall of the aortic arch. The bony thorax exhibits no acute abnormality. IMPRESSION: There is no active cardiopulmonary disease. Thoracic aortic atherosclerosis. Electronically Signed   By: David  [place] M.D.   On: 03/30/2018 15:29    Procedures Procedures (including  critical care time)  Medications Ordered in ED Medications  sodium chloride 0.9 % bolus 1,000 mL (1,000 mLs Intravenous New Bag/Given 03/30/18 1507)     Initial Impression / Assessment and Plan / ED Course  I have reviewed the triage vital signs and the nursing notes.  Pertinent labs & imaging results that were available during my care of the patient were reviewed by me and considered in my medical decision making (see chart for details).     Jason Melendez is a 62 y.o. male with a past medical history significant for prior stroke status post endarterectomy, CHF (EF 35-40% 11/18), diabetes, hypertension, hyperlipidemia, obesity, and prior neurocardiogenic syncope who presents with a syncopal episode.  Patient reports that his cardiologist is Dr. Katrinka BlazingSmith.  He says that he is both on Lasix and is fluid restricted for heart failure.  He says that today [place] [employer] [place] taking pictures and wall walking up a steep hill, he began feeling lightheaded.  He reports sitting down so he did not pass out.  He then drank some fluids however he was then speaking with the EMS director when he fully syncopized.  Patient reports he was told he syncopized for approximately 3 minutes.  He denied any palpitations, chest pain, or shortness of breath.  He denies any receding symptoms aside from some mildly loose stools over the last few days when they have been upping his metformin.  He denies any fevers, chills, cough, chest pain, abdominal pain, nausea, vomiting, or urinary symptoms.  He reports she is not having worsened peripheral edema.  On exam, patient found to have a palpated bradycardia.  On monitor, patient is found to have ventricular bigeminy and is only having a palpated heart rate in the 30s.  Patient was still feeling slightly lightheaded.  He continued to deny chest pain or palpitations.  Patient's lungs were clear to auscultation with no significant crackles.  Mild murmur  was heard.  Abdomen was nontender.  Minimal edema in the legs.  Patient was alert and oriented.  No focal neurologic deficits were seen.  Next  Clinically I am concerned about the combination of patient being dehydrated and exerting himself in the heat as well as the bigeminy causing him to be bradycardic.  Given the patient's heart failure with poor EF, fluid restrictions, and his new arrhythmia, patient will have screening laboratory testing.  He will also be given fluids to rehydrate him.  Anticipate speaking with cardiology due to the high risk syncope with his heart failure and new bradycardia  with his arrhythmia.  3:53 PM After patient's lab work has returned, cardiology will be called for evaluation and recommendations.     Final Clinical Impressions(s) / ED Diagnoses   Final diagnoses:  Syncope, unspecified syncope type  Bigeminy  Dehydration     Clinical Impression: 1. Syncope, unspecified syncope type   2. Bigeminy   3. Dehydration     Disposition: Care transferred to Dr. Clayborne Dana while awaiting labs and cards recs for high risk syncope with hypotension, CHF, and his new arrhythmia of bigeminy with palpated bradycardia in the 30s.     Tegeler, Canary Brim, MD 03/30/18 1556

## 2018-03-30 NOTE — ED Notes (Signed)
Attempted to give report to floor nurse , floor nurse not even available to take report at this time , was told by secretary to try again at [facility]1930

## 2018-03-30 NOTE — H&P (Signed)
Cardiology Admission History and Physical:   Patient ID: Jason Melendez; MRN: [id number]; DOB: Jul 04, 1956   Admission date: 03/30/2018  Primary Care Provider: Doreene Nest, NP Primary Cardiologist: Lesleigh Noe, MD  Primary Electrophysiologist:  N/A  Chief Complaint:  syncope  Patient Profile:   Jason Melendez is a 62 y.o. male with a history of CVA, carotid artery disease, systolic heart failure, hypertension, hyperlipidemia and recurrent syncope who presented with another syncopal episode.  History of Present Illness:   Jason Melendez is a pleasant 62 year old with a past medical history of CVA, carotid artery disease, systolic heart failure, hypertension, hyperlipidemia and recurrent syncope.  He underwent left carotid enterectomy after left brain stroke.  This has been followed by Dr. Myra Gianotti.  He also had a long-standing history of syncope.  The last episode of syncope was over a year ago in the cardiology office.  According to patient, most of his previous syncope occurred in very hot environment.  He is on both losartan-hydrochlorothiazide and also Lasix.  Despite the fact that his Lasix was prescribed, as needed basis, to the patient, he is actually taking the Lasix 3 times weekly.  This morning he went to golf tournament to take picture.  He did not take any Lasix this morning.  While climbing up the hill during the golf tournament, he became very dizzy.  He drank some water and was evaluated by the [facility] EMS service, after he attempted to get up, he subsequently passed out.  He was out for roughly 3 minutes before regaining consciousness.  It took him an additional 2 minutes to regain normal mental status.  Upon initial evaluation by EMS, he was noted to have heart rate in the 30s with ventricular bigeminy.  Cardiology has been consulted for recurrent syncope.  He denies any chest pain recently nor does he have any exertional symptoms   Past  Medical History:  Diagnosis Date  . HTN (hypertension)   . Hyperlipidemia     Past Surgical History:  Procedure Laterality Date  . ENDARTERECTOMY Left 10/16/2016   Procedure: ENDARTERECTOMY CAROTID;  Surgeon: Nada Libman, MD;  Location: [facility] OR;  Service: Vascular;  Laterality: Left;  . HERNIA REPAIR    . PATCH ANGIOPLASTY Left 10/16/2016   Procedure: PATCH ANGIOPLASTY USING Livia Snellen BIOLOGIC PATCH;  Surgeon: Nada Libman, MD;  Location: [facility] OR;  Service: Vascular;  Laterality: Left;     Medications Prior to Admission: Prior to Admission medications   Medication Sig Start Date End Date Taking? Authorizing Provider  acetaminophen (TYLENOL 8 HOUR ARTHRITIS PAIN) 650 MG CR tablet Take 650 mg by mouth 2 (two) times daily.    [provider]  aspirin 325 MG tablet Take 1 tablet (325 mg total) by mouth daily. 10/19/16   Elgergawy, Leana Roe, MD  atorvastatin (LIPITOR) 40 MG tablet TAKE 1 TABLET BY MOUTH DAILY AT 6 PM. 03/02/18   Doreene Nest, NP  carvedilol (COREG) 6.25 MG tablet Take 1 tablet (6.25 mg total) by mouth 2 (two) times daily with a meal. 11/08/17   Lyn Records, MD  escitalopram (LEXAPRO) 10 MG tablet Take 1 tablet (10 mg total) by mouth daily. 03/11/18   Doreene Nest, NP  furosemide (LASIX) 20 MG tablet Take 1 tablet (20 mg total) by mouth daily as needed for fluid. 01/14/18 04/14/18  Lyn Records, MD  losartan-hydrochlorothiazide (HYZAAR) 100-25 MG tablet Take 1 tablet by mouth daily. 11/17/17  Lyn Records, MD  metFORMIN (GLUCOPHAGE) 500 MG tablet Take 1 tablet (500 mg total) by mouth 2 (two) times daily with a meal. 03/11/18   Doreene Nest, NP  spironolactone (ALDACTONE) 25 MG tablet Take 1 tablet (25 mg total) by mouth daily. 12/31/17 03/31/18  Lyn Records, MD     Allergies:    Allergies  Allergen Reactions  . Hydralazine Hcl     Sudden drop in BP    Social History:   Social History   Socioeconomic History  . Marital status: Married     Spouse name: Not on file  . Number of children: Not on file  . Years of education: Not on file  . Highest education level: Not on file  Occupational History  . Not on file  Social Needs  . Financial resource strain: Not on file  . Food insecurity:    Worry: Not on file    Inability: Not on file  . Transportation needs:    Medical: Not on file    Non-medical: Not on file  Tobacco Use  . Smoking status: Never Smoker  . Smokeless tobacco: Never Used  Substance and Sexual Activity  . Alcohol use: No    Alcohol/week: 0.0 oz  . Drug use: No  . Sexual activity: Not Currently  Lifestyle  . Physical activity:    Days per week: Not on file    Minutes per session: Not on file  . Stress: Not on file  Relationships  . Social connections:    Talks on phone: Not on file    Gets together: Not on file    Attends religious service: Not on file    Active member of club or organization: Not on file    Attends meetings of clubs or organizations: Not on file    Relationship status: Not on file  . Intimate partner violence:    Fear of current or ex partner: Not on file    Emotionally abused: Not on file    Physically abused: Not on file    Forced sexual activity: Not on file  Other Topics Concern  . Not on file  Social History Narrative   Marital status: married      Children: 2 daughters      Lives:  With wife      Employment: printing work      Tobacco: none      Alcohol: one beer every three months      Drugs:  None      Exercise:  No formal exercise; walks up three flights of stairs several times per day at work.   Lives at home.  Works for [employer].     2 yrs college.      Family History:   The patient's family history includes Diabetes in his father and mother; Heart disease (age of onset: 75) in his father; Hypertension in his sister; Kidney disease in his father; Kidney failure in his father.    ROS:  Please see the history of present illness.  All other  ROS reviewed and negative.     Physical Exam/Data:   Vitals:   03/30/18 1530 03/30/18 1600 03/30/18 1630 03/30/18 1730  BP: 126/68 125/68 113/62 (!) 118/52  Pulse: 80 79 82 78  Resp: (!) 21 (!) 25 15 (!) 21  Temp:      TempSrc:      SpO2: 100% 97% 99% 96%    Intake/Output Summary (Last 24  hours) at 03/30/2018 1734 Last data filed at 03/30/2018 1729 Gross per 24 hour  Intake 1000 ml  Output -  Net 1000 ml   There were no vitals filed for this visit. There is no height or weight on file to calculate BMI.  General:  Well nourished, well developed, in no acute distress HEENT: normal Lymph: no adenopathy Neck: no JVD Endocrine:  No thryomegaly Vascular: No carotid bruits; FA pulses 2+ bilaterally without bruits  Cardiac:  normal S1, S2; RRR; no murmur  Lungs:  clear to auscultation bilaterally, no wheezing, rhonchi or rales  Abd: soft, nontender, no hepatomegaly  Ext: no edema Musculoskeletal:  No deformities, BUE and BLE strength normal and equal Skin: warm and dry  Neuro:  CNs 2-12 intact, no focal abnormalities noted Psych:  Normal affect    EKG:  The ECG that was done in the ED and was personally reviewed and demonstrates normal sinus rhythm with bigeminy  Relevant CV Studies:  Echo 07/12/2017 LV EF: 35% -   40%  ------------------------------------------------------------------- Indications:      Cardiomyopathy (I42.9).  ------------------------------------------------------------------- History:   PMH:   Cardiomyopathy.  Stroke.  Risk factors: Pre-diabetes. Hypertension. Obese.  ------------------------------------------------------------------- Study Conclusions  - Left ventricle: The cavity size was mildly dilated. There was   mild concentric hypertrophy. Systolic function was moderately   reduced. The estimated ejection fraction was in the range of 35%   to 40%. Wall motion was normal; there were no regional wall   motion abnormalities. Doppler  parameters are consistent with   abnormal left ventricular relaxation (grade 1 diastolic   dysfunction). Doppler parameters are consistent with elevated   ventricular end-diastolic filling pressure. - Mitral valve: Mildly thickened leaflets . There was mild   regurgitation. - Left atrium: The atrium was mildly dilated. - Right ventricle: Systolic function was normal. - Right atrium: The atrium was normal in size. - Tricuspid valve: There was mild regurgitation. - Pulmonary arteries: Systolic pressure was within the normal   range. - Pericardium, extracardiac: There was no pericardial effusion.  Impressions:  - Since the last study on 10/13/2016 LVEF has decreased from 40-45%   to 35-40%.   Laboratory Data:  Chemistry Recent Labs  Lab 03/30/18 1451  NA 140  K 4.5  CL 107  CO2 25  GLUCOSE 134*  BUN 30*  CREATININE 1.44*  CALCIUM 9.6  GFRNONAA 51*  GFRAA 59*  ANIONGAP 8    Recent Labs  Lab 03/30/18 1451  PROT 7.0  ALBUMIN 4.2  AST 23  ALT 16  ALKPHOS 33*  BILITOT 1.0   Hematology Recent Labs  Lab 03/30/18 1451  WBC 8.5  RBC 4.16*  HGB 12.9*  HCT 40.0  MCV 96.2  MCH 31.0  MCHC 32.3  RDW 12.5  PLT 137*   Cardiac EnzymesNo results for input(s): TROPONINI in the last 168 hours.  Recent Labs  Lab 03/30/18 1513  TROPIPOC 0.00    BNP Recent Labs  Lab 03/30/18 1451  BNP 29.0    DDimer No results for input(s): DDIMER in the last 168 hours.  Radiology/Studies:  Dg Chest 2 View  Result Date: 03/30/2018 CLINICAL DATA:  Syncopal episode of the golf course today possibly due to becoming overheated. No other symptoms. History of hypertension, hyperlipidemia, previous CVA. EXAM: CHEST - 2 VIEW COMPARISON:  PA and lateral chest x-ray of August 11, 2017 FINDINGS: The lungs are well-expanded and clear. The heart and pulmonary vascularity are normal. The mediastinum is normal in  width. There is no pleural effusion. There is calcification in the wall of the  aortic arch. The bony thorax exhibits no acute abnormality. IMPRESSION: There is no active cardiopulmonary disease. Thoracic aortic atherosclerosis. Electronically Signed   By: David  [place]Jordan M.D.   On: 03/30/2018 15:29    Assessment and Plan:   1. Recurrent syncope: Creatinine was elevated at 1.4, BUN was 30.  Symptoms likely triggered by the heat and dehydration.  He was also noted to have bigeminy by EMS, heart rate was in the 30s, however this is likely not accounting the PVC.  He is on losartan-HCTZ and a 3 times weekly dosing of Lasix.  I have instructed the patient to reduce the Lasix to either once weekly or only on a as needed basis.  He is prone to dehydration  2. Ventricular bigeminy: He is on carvedilol 6.25 mg twice daily at home.  Will increase carvedilol to 12.5 twice daily.   3. History of coronary artery disease: Followed by vascular surgery, Dr. Myra GianottiBrabham  4. Chronic systolic heart failure: No obvious sign of volume overload on physical exam  5. Hypertension: Blood pressure stable, plan to reduce Lasix to once weekly or as needed basis.  6. Hyperlipidemia: Continue Lipitor.  7. History of CVA: On aspirin 325 mg daily.  Severity of Illness: The appropriate patient status for this patient is OBSERVATION. Observation status is judged to be reasonable and necessary in order to provide the required intensity of service to ensure the patient's safety. The patient's presenting symptoms, physical exam findings, and initial radiographic and laboratory data in the context of their medical condition is felt to place them at decreased risk for further clinical deterioration. Furthermore, it is anticipated that the patient will be medically stable for discharge from the hospital within 2 midnights of admission. The following factors support the patient status of observation.   " The patient's presenting symptoms include syncope. " The physical exam findings include benign. " The initial  radiographic and laboratory data are elevated Cr.     For questions or updates, please contact [facility] Please consult [email or web address] for contact info under Cardiology/STEMI.    Ramond DialSigned, Lexy Meininger, [place]  03/30/2018 5:34 PM

## 2018-03-30 NOTE — ED Triage Notes (Signed)
Pt [place] via [facility] [place]. Pt stated he felt dizzy on scene and sat down for relief. Pt initially found by EMS to be unresponsive but became more alert soon after their arrival. Unknown down time. No fall reported. Pt alert and oriented at triage. EMS administered NS PTA. Initial BP 90/50, repeat was 92/58, HR 67, 96%ra, cbg 108. Pt has hx of CVA (no residual effect). Pt was diaphoretic on scene. EMS reported pt had 2 bottles of water at tournament.

## 2018-03-30 NOTE — ED Provider Notes (Signed)
3:42 PM Assumed care from Dr. Rush Landmarkegeler, please see their note for full history, physical and decision making until this point. In brief this is a 62 y.o. year old male who presented to the ED tonight with Loss of Consciousness     Syncope with bp 90's after all day in heat. In bigeminy intermittently. Pending labs. Needs cardiology consult.   Cardiology to admit.   Labs, studies and imaging reviewed by myself and considered in medical decision making if ordered. Imaging interpreted by radiology.  Labs Reviewed  CBC WITH DIFFERENTIAL/PLATELET - Abnormal; Notable for the following components:      Result Value   RBC 4.16 (*)    Hemoglobin 12.9 (*)    Platelets 137 (*)    All other components within normal limits  COMPREHENSIVE METABOLIC PANEL  MAGNESIUM  BRAIN NATRIURETIC PEPTIDE  I-STAT TROPONIN, ED    DG Chest 2 View  Final Result      No follow-ups on file.    Marily MemosMesner, Jerrel Tiberio, MD 03/31/18 [phone]

## 2018-03-30 NOTE — ED Notes (Signed)
  Attempted to call report.  RN in report currently and will call me back.

## 2018-03-31 ENCOUNTER — Other Ambulatory Visit: Payer: Self-pay

## 2018-03-31 ENCOUNTER — Telehealth: Payer: Self-pay | Admitting: Interventional Cardiology

## 2018-03-31 ENCOUNTER — Encounter (HOSPITAL_COMMUNITY): Payer: Self-pay | Admitting: *Deleted

## 2018-03-31 DIAGNOSIS — I10 Essential (primary) hypertension: Secondary | ICD-10-CM

## 2018-03-31 DIAGNOSIS — I5022 Chronic systolic (congestive) heart failure: Secondary | ICD-10-CM

## 2018-03-31 DIAGNOSIS — E119 Type 2 diabetes mellitus without complications: Secondary | ICD-10-CM

## 2018-03-31 DIAGNOSIS — R55 Syncope and collapse: Secondary | ICD-10-CM

## 2018-03-31 DIAGNOSIS — Z8673 Personal history of transient ischemic attack (TIA), and cerebral infarction without residual deficits: Secondary | ICD-10-CM | POA: Diagnosis not present

## 2018-03-31 LAB — BASIC METABOLIC PANEL
Anion gap: 10 (ref 5–15)
BUN: 24 mg/dL — ABNORMAL HIGH (ref 8–23)
CO2: 26 mmol/L (ref 22–32)
Calcium: 9.1 mg/dL (ref 8.9–10.3)
Chloride: 105 mmol/L (ref 98–111)
Creatinine, Ser: 1.25 mg/dL — ABNORMAL HIGH (ref 0.61–1.24)
GFR calc Af Amer: >60 mL/min (ref >60)
GFR calc non Af Amer: >60 mL/min (ref >60)
Glucose, Bld: 99 mg/dL (ref 70–99)
Potassium: 4.4 mmol/L (ref 3.5–5.1)
Sodium: 141 mmol/L (ref 135–145)

## 2018-03-31 LAB — CBC
HCT: 34.9 % — ABNORMAL LOW (ref 39.0–52.0)
Hemoglobin: 11.2 g/dL — ABNORMAL LOW (ref 13.0–17.0)
MCH: 30.9 pg (ref 26.0–34.0)
MCHC: 32.1 g/dL (ref 30.0–36.0)
MCV: 96.4 fL (ref 78.0–100.0)
Platelets: 124 10*3/uL — ABNORMAL LOW (ref 150–400)
RBC: 3.62 MIL/uL — ABNORMAL LOW (ref 4.22–5.81)
RDW: 12.8 % (ref 11.5–15.5)
WBC: 7 10*3/uL (ref 4.0–10.5)

## 2018-03-31 LAB — GLUCOSE, CAPILLARY
Glucose-Capillary: 140 mg/dL — ABNORMAL HIGH (ref 70–99)
Glucose-Capillary: 156 mg/dL — ABNORMAL HIGH (ref 70–99)

## 2018-03-31 LAB — HIV ANTIBODY (ROUTINE TESTING W REFLEX): HIV Screen 4th Generation wRfx: NONREACTIVE

## 2018-03-31 MED ORDER — CARVEDILOL 12.5 MG PO TABS: 12.5000 mg | ORAL_TABLET | Freq: Two times a day (BID) | ORAL | 3 refills | Status: DC

## 2018-03-31 MED ORDER — AMLODIPINE BESYLATE 5 MG PO TABS: 5.0000 mg | ORAL_TABLET | Freq: Every day | ORAL | Status: DC

## 2018-03-31 MED ORDER — FUROSEMIDE 20 MG PO TABS: 20.0000 mg | ORAL_TABLET | ORAL | 3 refills | Status: DC | PRN

## 2018-03-31 NOTE — Telephone Encounter (Signed)
New Message:       TOC on 04/18/18 @ 11:00 with Tyrone SageGerhardt per Dot LanesKrista

## 2018-03-31 NOTE — Progress Notes (Signed)
Jason RibasWarner R Jankovich Melendez to be D/C'd Home per MD order.  Discussed with the patient and all questions fully answered.  VSS, Skin clean, dry and intact without evidence of skin break down, no evidence of skin tears noted. IV catheter discontinued intact. Site without signs and symptoms of complications. Dressing and pressure applied.  An After Visit Summary was printed and given to the patient. Patient received prescription.  D/c education completed with patient/family including follow up instructions, medication list, d/c activities limitations if indicated, with other d/c instructions as indicated by MD - patient able to verbalize understanding, all questions fully answered.   Patient instructed to return to ED, call 911, or call MD for any changes in condition.   Patient escorted to exit and D/C'd via private auto.  Jason Melendez 03/31/2018 1:46 PM

## 2018-03-31 NOTE — Discharge Summary (Signed)
Discharge Summary    Patient ID: Jason Melendez,  MRN: [id number], DOB/AGE: Jun 04, 1956 62 y.o.  Admit date: 03/30/2018 Discharge date: 03/31/2018  Primary Care Provider: Doreene Nest Primary Cardiologist: Lesleigh Noe, MD  Discharge Diagnoses    Principal Problem:   Syncope Active Problems:   CVA (cerebral vascular accident) (HCC)   HLD (hyperlipidemia)   Stenosis of left carotid artery   Type 2 diabetes mellitus (HCC)   Chronic combined systolic and diastolic heart failure (HCC)   Hypertension   Allergies Allergies  Allergen Reactions  . Hydralazine Hcl     Sudden drop in BP    Diagnostic Studies/Procedures    None _____________   History of Present Illness     Jason Melendez is a pleasant 62 year old with a past medical history of CVA, carotid artery disease, systolic heart failure, hypertension, hyperlipidemia and recurrent syncope.  He underwent left carotid enterectomy after left brain stroke.  This has been followed by Dr. Myra Gianotti.  He also had a long-standing history of syncope.  The last episode of syncope was over a year ago in the cardiology office.  According to patient, most of his previous syncope occurred in very hot environments.  He is on both losartan-hydrochlorothiazide and also Lasix.  Despite the fact that his Lasix was prescribed, as needed basis, he has been taking the Lasix 3 times weekly.  On the morning of admission, he went to a golf tournament to take pictures.  He did not take any Lasix that morning.  While climbing up the hill during the golf tournament, he became very dizzy.  He drank some water and was evaluated by the [facility] EMS service, after he attempted to get up, he subsequently passed out.  He was out for roughly 3 minutes before regaining consciousness.  It took him an additional 2 minutes to regain normal mental status.  Upon initial evaluation by EMS, he was noted to have heart rate in the 30s with  ventricular bigeminy. Cardiology admitted the patient for recurrent syncope.  He denied any chest pain recently nor does he have any exertional symptoms    Melendez Course     Consultants: None   1. Syncope: patient experienced a syncopal episode in the setting of poor po intake and walking up a hill outside at a golf tournament. He reported typical presyncopal dizziness/lightheadedness but denied chest pain, palpitations, or neurologic changes surrounding the event. He was noted to have an elevated Cr/BUN which improved with IVF, supporting dehydration as leading cause of syncope. He has also been taking his po lasix more frequently than recommended. PVCs noted on telemetry, otherwise no significant arrhythmias throughout hospitalization. Troponins were negative x3. Home coreg increased to 12.5mg  BID to assist with frequent PVCs. Home losartan/HCTZ, amlodipine, and spironolactone continued. Patient instructed to take lasix as needed only for SOB.   2. HTN: BP stable throughout admission - Continue home losartan/HCTZ, amlodipine, and spironolactone - Home coreg increased to 12.5mg  BID - Patient instructed to only take lasix if having shortness of breath  3. History of CVA and carotid stenosis: s/p CEA - Continue ASA 325mg  daily  4. HLD: - Continue atorvastatin  5. DM type 2: - Continue metformin _____________  Discharge Vitals Blood pressure 125/72, pulse 64, temperature 97.7 F (36.5 C), temperature source Oral, resp. rate 18, height 6\' 1"  (1.854 m), weight 259 lb 3.2 oz (117.6 kg), SpO2 98 %.  Filed Weights   03/30/18 2035  Weight: 259 lb 3.2 oz (117.6 kg)    Labs & Radiologic Studies    CBC Recent Labs    03/30/18 1451 03/31/18 0316  WBC 8.5 7.0  NEUTROABS 6.2  --   HGB 12.9* 11.2*  HCT 40.0 34.9*  MCV 96.2 96.4  PLT 137* 124*   Basic Metabolic Panel Recent Labs    16/10/96 1451 03/31/18 0316  NA 140 141  K 4.5 4.4  CL 107 105  CO2 25 26  GLUCOSE 134* 99    BUN 30* 24*  CREATININE 1.44* 1.25*  CALCIUM 9.6 9.1  MG 2.1  --    Liver Function Tests Recent Labs    03/30/18 1451  AST 23  ALT 16  ALKPHOS 33*  BILITOT 1.0  PROT 7.0  ALBUMIN 4.2   No results for input(s): LIPASE, AMYLASE in the last 72 hours. Cardiac Enzymes No results for input(s): CKTOTAL, CKMB, CKMBINDEX, TROPONINI in the last 72 hours. BNP Invalid input(s): POCBNP D-Dimer No results for input(s): DDIMER in the last 72 hours. Hemoglobin A1C No results for input(s): HGBA1C in the last 72 hours. Fasting Lipid Panel No results for input(s): CHOL, HDL, LDLCALC, TRIG, CHOLHDL, LDLDIRECT in the last 72 hours. Thyroid Function Tests No results for input(s): TSH, T4TOTAL, T3FREE, THYROIDAB in the last 72 hours.  Invalid input(s): FREET3 _____________  Dg Chest 2 View  Result Date: 03/30/2018 CLINICAL DATA:  Syncopal episode of the golf course today possibly due to becoming overheated. No other symptoms. History of hypertension, hyperlipidemia, previous CVA. EXAM: CHEST - 2 VIEW COMPARISON:  PA and lateral chest x-ray of August 11, 2017 FINDINGS: The lungs are well-expanded and clear. The heart and pulmonary vascularity are normal. The mediastinum is normal in width. There is no pleural effusion. There is calcification in the wall of the aortic arch. The bony thorax exhibits no acute abnormality. IMPRESSION: There is no active cardiopulmonary disease. Thoracic aortic atherosclerosis. Electronically Signed   By: David  [place] M.D.   On: 03/30/2018 15:29   Disposition   Patient was seen and examined by Dr. Cristal Deer who deemed patient as stable for discharge. Follow-up has been arranged. Discharge medications as listed below.   Follow-up Plans & Appointments    Follow-up Information    Rosalio Macadamia, NP Follow up on 04/18/2018.   Specialties:  Nurse Practitioner, Interventional Cardiology, Cardiology, Radiology Why:  Please arrive 15 minutes early for your 11:00am  appointment Contact information: [address] [place] [address] [phone]          Discharge Instructions    Diet - low sodium heart healthy   Complete by:  As directed    Increase activity slowly   Complete by:  As directed       Discharge Medications   Allergies as of 03/31/2018      Reactions   Hydralazine Hcl    Sudden drop in BP      Medication List    TAKE these medications   amLODipine 5 MG tablet Commonly known as:  NORVASC Take 5 mg by mouth every morning.   aspirin 325 MG tablet Take 1 tablet (325 mg total) by mouth daily.   atorvastatin 40 MG tablet Commonly known as:  LIPITOR TAKE 1 TABLET BY MOUTH DAILY AT 6 PM. What changed:  See the new instructions.   carvedilol 12.5 MG tablet Commonly known as:  COREG Take 1 tablet (12.5 mg total) by mouth 2 (two) times daily with a  meal. What changed:    medication strength  how much to take   escitalopram 10 MG tablet Commonly known as:  LEXAPRO Take 1 tablet (10 mg total) by mouth daily. What changed:  when to take this   furosemide 20 MG tablet Commonly known as:  LASIX Take 1 tablet (20 mg total) by mouth as needed (shortness of breath). Sunday, Monday, Wednesday What changed:    when to take this  reasons to take this   losartan-hydrochlorothiazide 100-25 MG tablet Commonly known as:  HYZAAR Take 1 tablet by mouth daily.   metFORMIN 500 MG tablet Commonly known as:  GLUCOPHAGE Take 1 tablet (500 mg total) by mouth 2 (two) times daily with a meal.   spironolactone 25 MG tablet Commonly known as:  ALDACTONE Take 1 tablet (25 mg total) by mouth daily.   TYLENOL 8 HOUR ARTHRITIS PAIN 650 MG CR tablet Generic drug:  acetaminophen Take 650 mg by mouth 2 (two) times daily.          Outstanding Labs/Studies   None  Duration of Discharge Encounter   Greater than 30 minutes including physician time.  Signed, Beatriz StallionKrista M. Melvenia Favela PA-C 03/31/2018, 1:33  PM

## 2018-03-31 NOTE — Progress Notes (Signed)
Pt had a run of bigeminy. MD Cristal Deerhristopher made aware.Will continue to assess.

## 2018-03-31 NOTE — Discharge Instructions (Signed)
PLEASE REMEMBER TO BRING ALL OF YOUR MEDICATIONS TO EACH OF YOUR FOLLOW-UP OFFICE VISITS.  PLEASE ATTEND ALL SCHEDULED FOLLOW-UP APPOINTMENTS.   Take your lasix as needed only for shortness of breath. You can contact the office directly if you have any questions on how to take your lasix.    Stay well hydrated and avoid skipping meals

## 2018-03-31 NOTE — Progress Notes (Signed)
Progress Note  Patient Name: Jason RibasWarner R Strickling Jr Date of Encounter: 03/31/2018  Primary Cardiologist: Lesleigh NoeHenry W Smith III, MD   Subjective   Patient did well overnight. No presyncope or syncope. Feeling well, no chest pain or shortness of breath.  Inpatient Medications    Scheduled Meds: . acetaminophen  650 mg Oral BID  . aspirin  325 mg Oral Daily  . atorvastatin  40 mg Oral q1800  . carvedilol  12.5 mg Oral BID WC  . escitalopram  10 mg Oral Daily  . heparin  5,000 Units Subcutaneous Q8H  . insulin aspart  0-15 Units Subcutaneous TID WC  . insulin aspart  0-5 Units Subcutaneous QHS  . losartan  100 mg Oral Daily  . metFORMIN  500 mg Oral BID WC  . sodium chloride flush  3 mL Intravenous Q12H   Continuous Infusions:  PRN Meds:    Vital Signs    Vitals:   03/30/18 2000 03/30/18 2035 03/31/18 0535 03/31/18 0832  BP: 139/78 (!) 158/63 116/79 125/72  Pulse: 79 71 65 64  Resp: 20  18   Temp:  98 F (36.7 C) 97.7 F (36.5 C)   TempSrc:  Oral Oral   SpO2: 96% 100% 98%   Weight:  259 lb 3.2 oz (117.6 kg)    Height:  6\' 1"  (1.854 m)      Intake/Output Summary (Last 24 hours) at 03/31/2018 0908 Last data filed at 03/30/2018 2000 Gross per 24 hour  Intake 1100 ml  Output -  Net 1100 ml   Filed Weights   03/30/18 2035  Weight: 259 lb 3.2 oz (117.6 kg)    Telemetry    Sinus rhythm with occasional PVCs and brief run of bigeminy - Personally Reviewed  ECG    Sinus rhythm with ventricular bigeminy - Personally Reviewed  Physical Exam   GEN: No acute distress.   Neck: supple, no JVD Cardiac: regular S1 and S2, no murmurs, rubs, or gallops.  Respiratory: Clear to auscultation bilaterally. GI: Soft, nontender, non-distended. Bowel sounds normal MS: No edema; No deformity. Neuro:  Nonfocal, moves all limbs independently Psych: Normal affect   Labs    Chemistry Recent Labs  Lab 03/30/18 1451 03/31/18 0316  NA 140 141  K 4.5 4.4  CL 107 105  CO2 25  26  GLUCOSE 134* 99  BUN 30* 24*  CREATININE 1.44* 1.25*  CALCIUM 9.6 9.1  PROT 7.0  --   ALBUMIN 4.2  --   AST 23  --   ALT 16  --   ALKPHOS 33*  --   BILITOT 1.0  --   GFRNONAA 51* >60  GFRAA 59* >60  ANIONGAP 8 10     Hematology Recent Labs  Lab 03/30/18 1451 03/31/18 0316  WBC 8.5 7.0  RBC 4.16* 3.62*  HGB 12.9* 11.2*  HCT 40.0 34.9*  MCV 96.2 96.4  MCH 31.0 30.9  MCHC 32.3 32.1  RDW 12.5 12.8  PLT 137* 124*    Cardiac EnzymesNo results for input(s): TROPONINI in the last 168 hours.  Recent Labs  Lab 03/30/18 1513 03/30/18 1734 03/30/18 1757  TROPIPOC 0.00 0.00 0.00     BNP Recent Labs  Lab 03/30/18 1451  BNP 29.0     DDimer No results for input(s): DDIMER in the last 168 hours.   Radiology    Dg Chest 2 View  Result Date: 03/30/2018 CLINICAL DATA:  Syncopal episode of the golf course today possibly due to  becoming overheated. No other symptoms. History of hypertension, hyperlipidemia, previous CVA. EXAM: CHEST - 2 VIEW COMPARISON:  PA and lateral chest x-ray of August 11, 2017 FINDINGS: The lungs are well-expanded and clear. The heart and pulmonary vascularity are normal. The mediastinum is normal in width. There is no pleural effusion. There is calcification in the wall of the aortic arch. The bony thorax exhibits no acute abnormality. IMPRESSION: There is no active cardiopulmonary disease. Thoracic aortic atherosclerosis. Electronically Signed   By: David  [place] M.D.   On: 03/30/2018 15:29    Cardiac Studies  None   Patient Profile     62 y.o. male with PMH CVA, carotid artery disease s/p CVA, systolic heart failure with EF 35-40%, hypertension, hyperlipidemia, diabetes, and recurrent vasovagal syncope who is seen after an episode of syncope.  Assessment & Plan    Syncope, hypertension, chronic systolic heart failure: patient was taking lasix, not drinking fluids, and walking uphill outside at the golf [place]. Had typical presyncopal  symptoms prior to syncope. No chest pain, palpitations, or neuro changes surrounding event. Cr and BUN up yesterday, improved with fluids overnight. No significant bradycardia, pause, or NSVT/VT on monitor. Has intermittent PVCs and brief ventricular bigeminy. Feeling well this AM -increased carvedilol in attempt to further suppress PVCs. Patient will ambulate this AM, if he feels well on this dose we will change it at discharge. -patient was taking lasix 3x/week. Does not appear volume overloaded at all. Does weight himself routinely, sometimes multiple times/day. Weight naturally fluctuates, and he is actively trying to lose weight as well. Given this, would recommend using lasix only PRN and perhaps based on shortness of breath instead of weights. He has done well from a blood pressure standpoint on HCTZ, but if he begins to require lasix then would DC HCTZ. -continue amlodipine, losartan -continue spironolactone, though again if he requires lasix going forward would reassess this medication given his predisposition to dehydration/syncope  History of CVA, carotid stenosis: continue ASA 325 mg.   Hyperlipidemia: continue atorvastatin  Diabetes: continue metformin   Time Spent Directly with Patient: I have spent a total of >35 minutes with the patient reviewing hospital notes, telemetry, EKGs, labs and examining the patient as well as establishing an assessment and plan that was discussed personally with the patient.  > 50% of time was spent in direct patient care and discharge planning.  Length of Stay:  LOS: 0 days   Jodelle Red, MD, PhD [facility]   03/31/2018, 9:08 AM  For questions or updates, please contact [facility] Please consult [email or web address] for contact info under Cardiology/STEMI.

## 2018-04-02 ENCOUNTER — Encounter: Payer: Self-pay | Admitting: Primary Care

## 2018-04-04 NOTE — Telephone Encounter (Signed)
**Note De-identified Adoni Greenough Obfuscation** 1st attempt: Left a message on the pts VM asking him to call me back. 

## 2018-04-05 NOTE — Telephone Encounter (Signed)
Will you please fax a glucometer kit to his pharmacy? Thanks!

## 2018-04-05 NOTE — Telephone Encounter (Signed)
**Note De-Identified Jason Melendez Obfuscation** Patient [facility].  Patient understands to follow up with provider Norma FredricksonLori Gerhardt, Np on 04/18/18 at 11:00 at [address] [place]. Patient understands discharge instructions? Yes Patient understands medications and regiment? Yes Patient understands to bring all medications to this visit? Yes

## 2018-04-06 MED ORDER — BLOOD GLUCOSE METER KIT: PACK | 0 refills | Status: DC

## 2018-04-06 NOTE — Telephone Encounter (Signed)
Faxed Rx for the glucometer to [facility]

## 2018-04-12 ENCOUNTER — Other Ambulatory Visit: Payer: Self-pay | Admitting: Interventional Cardiology

## 2018-04-18 ENCOUNTER — Encounter: Payer: Self-pay | Admitting: Nurse Practitioner

## 2018-04-18 ENCOUNTER — Ambulatory Visit: Payer: BLUE CROSS/BLUE SHIELD | Admitting: Nurse Practitioner

## 2018-04-18 VITALS — BP 90/62 | HR 69 | Ht 73.0 in | Wt 260.8 lb

## 2018-04-18 DIAGNOSIS — I5042 Chronic combined systolic (congestive) and diastolic (congestive) heart failure: Secondary | ICD-10-CM

## 2018-04-18 MED ORDER — FUROSEMIDE 20 MG PO TABS: 20.0000 mg | ORAL_TABLET | Freq: Every day | ORAL | 3 refills | Status: DC | PRN

## 2018-04-18 MED ORDER — SACUBITRIL-VALSARTAN 24-26 MG PO TABS: 1.0000 | ORAL_TABLET | Freq: Two times a day (BID) | ORAL | 6 refills | Status: DC

## 2018-04-18 NOTE — Patient Instructions (Addendum)
We will be checking the following labs today - BMET & CBC  BMET in one week   Medication Instructions:    Continue with your current medicines. BUT  I have taken the Amlodipine off your list today - please do not take  I am stopping the losartan/HCTZ  You may use Lasix 20 mg ONLY IF NEEDED FOR WEIGHT GAIN OF 3 POUNDS OR MORE OVERNIGHT  I am adding Entresto 24-26mg  to take twice a day.      Testing/Procedures To Be Arranged:  N/A  Follow-Up:   See me in a few weeks    Other Special Instructions:   Keep a check on your blood pressure for us.    If you need a refill on your cardiac medications before your next appointment, please call your pharmacy.   Call the [facility] office at [phone] if you have any questions, problems or concerns.

## 2018-04-18 NOTE — Progress Notes (Signed)
CARDIOLOGY OFFICE NOTE  Date:  04/18/2018    [name] Date of Birth: 11/24/1955 Medical Record #[id number]  PCP:  Pleas Koch, NP  Cardiologist:  Tamala Julian  Chief Complaint  Patient presents with  . Congestive Heart Failure    Follow up visit - seen for Dr. Tamala Julian    History of Present Illness: Jason Melendez is a 62 y.o. male who presents today for a follow up visit. Seen for Dr. Tamala Julian.   He has had prior left brain stroke, high-grade left carotid stenosis treated with surgery by Dr. Trula Slade, asymptomatic systolic heart failure, hypertension, hyperlipidemia,  recurrent episodes of syncope (vasovagal) and chronic combined systolic and diastolic heart failure on guideline directed medical therapy for LV preservation. Presumed etiology of CM is HTN - negative Myoview in February of 2018. He has not had cardiac cath.   Last seen by Dr. Tamala Julian back in March - felt to be doing well. Was referred to the pharmacy here for med titration/BP management.   Admitted at the end of July with recurrent syncope - felt to be vasovagal in the setting of volume depletion and exertion. Lasix changed to prn. Aldactone was held. Coreg increased given frequency of PVCs noted.    Comes in today. Here alone. Noted that he had been taking his Lasix along with his HCTZ about 3 times a week due to swelling. He has had a long history of passing out - notes that typically related to "getting hot". 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 He had had 2 bottles of water - actually felt bloated.  While climbing up a hill he got dizzy - he went and sat down. Staff passing out water had EMS come over to check on him - he sat for about 10 minutes but when attempted to get up - he passed out for about 3 minutes. It is noted in the record that upon evaluation by EMS, HR was in the 30's with ventricular bigeminy.   He is now on higher dose  Coreg. BP has been soft at home. He has continued to have some dizzy spells but no recurrent syncope. BP is running in the low 100's to 115's at home. He notes he feels better in the 120 range. He is no longer on his Lasix at all - says "that was taken away". No chest pain. Breathing is ok. Wishes he could be more active but notes the heat is quite a factor. There is concern that he may still be taking the Norvasc - this was to have been stopped back in May. Looks like the plan was to stop the HCTZ and add back Lasix as needed due to his CHF. He has had no palpitations.   Past Medical History:  Diagnosis Date  . HTN (hypertension)   . Hyperlipidemia     Past Surgical History:  Procedure Laterality Date  . ENDARTERECTOMY Left 10/16/2016   Procedure: ENDARTERECTOMY CAROTID;  Surgeon: Serafina Mitchell, MD;  Location: [facility];  Service: Vascular;  Laterality: Left;  . HERNIA REPAIR    . PATCH ANGIOPLASTY Left 10/16/2016   Procedure: PATCH ANGIOPLASTY USING Rueben Bash BIOLOGIC PATCH;  Surgeon: Serafina Mitchell, MD;  [facility];  Service: Vascular;  Laterality: Left;     Medications: Current Meds  Medication Sig  . acetaminophen (TYLENOL 8 HOUR ARTHRITIS PAIN) 650 MG CR tablet Take 650 mg by mouth 2 (  two) times daily.  Marland Kitchen aspirin 325 MG tablet Take 1 tablet (325 mg total) by mouth daily.  Marland Kitchen atorvastatin (LIPITOR) 40 MG tablet Take 40 mg by mouth daily at 6 PM.  . blood glucose meter kit and supplies Dispense based on patient and insurance preference. Use to check blood sugar once daily as directed. (FOR ICD-10 E10.9, E11.9).  . carvedilol (COREG) 12.5 MG tablet Take 1 tablet (12.5 mg total) by mouth 2 (two) times daily with a meal.  . escitalopram (LEXAPRO) 10 MG tablet Take 10 mg by mouth at bedtime.  . metFORMIN (GLUCOPHAGE) 500 MG tablet Take 1 tablet (500 mg total) by mouth 2 (two) times daily with a meal.  . spironolactone (ALDACTONE) 25 MG tablet Take 1 tablet (25 mg total) by mouth daily.    . [DISCONTINUED] amLODipine (NORVASC) 5 MG tablet Take 5 mg by mouth every morning.  . [DISCONTINUED] atorvastatin (LIPITOR) 40 MG tablet TAKE 1 TABLET BY MOUTH DAILY AT 6 PM. (Patient taking differently: TAKE 1 TABLET BY MOUTH DAILY AT 7 PM.)  . [DISCONTINUED] losartan-hydrochlorothiazide (HYZAAR) 100-25 MG tablet Take 1 tablet by mouth daily.     Allergies: Allergies  Allergen Reactions  . Hydralazine Hcl     Sudden drop in BP    Social History: The patient  reports that he has never smoked. He has never used smokeless tobacco. He reports that he does not drink alcohol or use drugs.   Family History: The patient's family history includes Diabetes in his father and mother; Heart disease (age of onset: 10) in his father; Hypertension in his sister; Kidney disease in his father; Kidney failure in his father.   Review of Systems: Please see the history of present illness.   Otherwise, the review of systems is positive for none.   All other systems are reviewed and negative.   Physical Exam: VS:  BP 90/62 (BP Location: Left Arm, Patient Position: Sitting, Cuff Size: Large)   Pulse 69   Ht '6\' 1"'  (1.854 m)   Wt 260 lb 12.8 oz (118.3 kg)   SpO2 97% Comment: at rest  BMI 34.41 kg/m  .  BMI Body mass index is 34.41 kg/m.  Wt Readings from Last 3 Encounters:  04/18/18 260 lb 12.8 oz (118.3 kg)  03/30/18 259 lb 3.2 oz (117.6 kg)  03/11/18 262 lb 8 oz (119.1 kg)    General: Pleasant. Well developed, well nourished and in no acute distress.   HEENT: Normal.  Neck: Supple, no JVD, carotid bruits, or masses noted.  Cardiac: Regular rate and rhythm. No murmurs, rubs, or gallops. No ectopy noted. No edema.  Respiratory:  Lungs are clear to auscultation bilaterally with normal work of breathing.  GI: Soft and nontender.  MS: No deformity or atrophy. Gait and ROM intact.  Skin: Warm and dry. Color is normal.  Neuro:  Strength and sensation are intact and no gross focal deficits noted.   Psych: Alert, appropriate and with normal affect.   LABORATORY DATA:  EKG:  EKG is not ordered today.  Lab Results  Component Value Date   WBC 7.0 03/31/2018   HGB 11.2 (L) 03/31/2018   HCT 34.9 (L) 03/31/2018   PLT 124 (L) 03/31/2018   GLUCOSE 99 03/31/2018   CHOL 116 03/04/2018   TRIG 206.0 (H) 03/04/2018   HDL 27.10 (L) 03/04/2018   LDLDIRECT 52.0 03/04/2018   LDLCALC 56 01/22/2017   ALT 16 03/30/2018   AST 23 03/30/2018   NA 141  03/31/2018   K 4.4 03/31/2018   CL 105 03/31/2018   CREATININE 1.25 (H) 03/31/2018   BUN 24 (H) 03/31/2018   CO2 26 03/31/2018   TSH 1.460 07/21/2015   PSA 1.16 01/22/2017   INR 1.06 10/17/2016   HGBA1C 7.2 (H) 03/04/2018     BNP (last 3 results) Recent Labs    03/30/18 1451  BNP 29.0    ProBNP (last 3 results) No results for input(s): PROBNP in the last 8760 hours.   Other Studies Reviewed Today:  2D Doppler echocardiogram 07/12/17: Study Conclusions  - Left ventricle: The cavity size was mildly dilated. There was mild concentric hypertrophy. Systolic function was moderately reduced. The estimated ejection fraction was in the range of 35% to 40%. Wall motion was normal; there were no regional wall motion abnormalities. Doppler parameters are consistent with abnormal left ventricular relaxation (grade 1 diastolic dysfunction). Doppler parameters are consistent with elevated ventricular end-diastolic filling pressure. - Mitral valve: Mildly thickened leaflets . There was mild regurgitation. - Left atrium: The atrium was mildly dilated. - Right ventricle: Systolic function was normal. - Right atrium: The atrium was normal in size. - Tricuspid valve: There was mild regurgitation. - Pulmonary arteries: Systolic pressure was within the normal range. - Pericardium, extracardiac: There was no pericardial effusion.  Impressions:  - Since the last study on 10/13/2016 LVEF has decreased from 40-45% to  35-40%.   MYOVIEW IMPRESSION 10/2016: 1. Mild attenuation of the left ventricular apex without evidence of inducible myocardial ischemia.  2. Global left ventricular hypokinesis with LV cavity dilatation.  3. Left ventricular ejection fraction 34%  4. Non invasive risk stratification*: Intermediate  *2012 Appropriate Use Criteria for Coronary Revascularization Focused Update: J Am Coll Cardiol. 3748;27(0):786-754. http://content.[email or web address].aspx?articleid=1201161   Electronically Signed   By: Aletta Edouard M.D.   On: 10/15/2016 15:01    Assessment/Plan:  1. Recurrent syncope - he has had what has been defined as vasovagal syncope in the past - now worriesome with mention of HR in the 30's with ventricular ectopy in the setting of known cardiomyopathy - discussed with Dr. Tamala Julian - will proceed with guideline medical therapy for his CHF at this time - plan to repeat the echo in about 3 to 6 months. He remains on beta blocker at this time and this dose has been increased. No pauses or bradycardia noted while on the monitor with this recent admission.   2. Chronic combined systolic & diastolic HF - presumed related to HTN. Discussed with Dr. Tamala Julian here in the office today - will make sure he is off the Norvasc. Stopping Hyzaar today. Adding back prn lasix. Will try Entresto 24-64m BID - watch for dizziness. Plan to get limited echo in a few months and then consider referral to EP if EF is less than 40%.   3. HTN - BP now soft. See above.   4. DM  5. Prior stroke with carotid endarterectomy - he remains on high dose aspirin per [facility].   6. HLD - on statin therapy.   Current medicines are reviewed with the patient today.  The patient does not have concerns regarding medicines other than what has been noted above.  The following changes have been made:  See above.  Labs/ tests ordered today include:    Orders Placed This Encounter  Procedures  . Basic  metabolic panel  . Basic metabolic panel  . CBC     Disposition:   FU with me in a few weeks.  Patient is agreeable to this plan and will call if any problems develop in the interim.   SignedTruitt Merle, NP  04/18/2018 12:09 PM  [facility] [address] [place], [place]  [address] Phone: [phone] Fax: [phone]

## 2018-04-19 LAB — CBC
Hematocrit: 38.1 % (ref 37.5–51.0)
Hemoglobin: 12.4 g/dL — ABNORMAL LOW (ref 13.0–17.7)
MCH: 31 pg (ref 26.6–33.0)
MCHC: 32.5 g/dL (ref 31.5–35.7)
MCV: 95 fL (ref 79–97)
Platelets: 173 10*3/uL (ref 150–450)
RBC: 4 x10E6/uL — ABNORMAL LOW (ref 4.14–5.80)
RDW: 13.7 % (ref 12.3–15.4)
WBC: 6.3 10*3/uL (ref 3.4–10.8)

## 2018-04-19 LAB — BASIC METABOLIC PANEL
BUN/Creatinine Ratio: 19 (ref 10–24)
BUN: 20 mg/dL (ref 8–27)
CO2: 22 mmol/L (ref 20–29)
Calcium: 9.7 mg/dL (ref 8.6–10.2)
Chloride: 103 mmol/L (ref 96–106)
Creatinine, Ser: 1.06 mg/dL (ref 0.76–1.27)
GFR calc Af Amer: 87 mL/min/{1.73_m2} (ref >59)
GFR calc non Af Amer: 75 mL/min/{1.73_m2} (ref >59)
Glucose: 123 mg/dL — ABNORMAL HIGH (ref 65–99)
Potassium: 4.5 mmol/L (ref 3.5–5.2)
Sodium: 142 mmol/L (ref 134–144)

## 2018-04-25 ENCOUNTER — Other Ambulatory Visit: Payer: BLUE CROSS/BLUE SHIELD | Admitting: *Deleted

## 2018-04-25 DIAGNOSIS — I5042 Chronic combined systolic (congestive) and diastolic (congestive) heart failure: Secondary | ICD-10-CM | POA: Diagnosis not present

## 2018-04-25 LAB — BASIC METABOLIC PANEL
BUN/Creatinine Ratio: 16 (ref 10–24)
BUN: 19 mg/dL (ref 8–27)
CO2: 24 mmol/L (ref 20–29)
Calcium: 8.8 mg/dL (ref 8.6–10.2)
Chloride: 106 mmol/L (ref 96–106)
Creatinine, Ser: 1.21 mg/dL (ref 0.76–1.27)
GFR calc Af Amer: 74 mL/min/{1.73_m2} (ref >59)
GFR calc non Af Amer: 64 mL/min/{1.73_m2} (ref >59)
Glucose: 131 mg/dL — ABNORMAL HIGH (ref 65–99)
Potassium: 4.5 mmol/L (ref 3.5–5.2)
Sodium: 142 mmol/L (ref 134–144)

## 2018-05-09 ENCOUNTER — Ambulatory Visit (INDEPENDENT_AMBULATORY_CARE_PROVIDER_SITE_OTHER): Payer: BLUE CROSS/BLUE SHIELD | Admitting: Nurse Practitioner

## 2018-05-09 ENCOUNTER — Encounter: Payer: Self-pay | Admitting: Nurse Practitioner

## 2018-05-09 VITALS — BP 100/60 | HR 63 | Ht 73.0 in | Wt 261.4 lb

## 2018-05-09 DIAGNOSIS — I5042 Chronic combined systolic (congestive) and diastolic (congestive) heart failure: Secondary | ICD-10-CM

## 2018-05-09 DIAGNOSIS — E7849 Other hyperlipidemia: Secondary | ICD-10-CM | POA: Diagnosis not present

## 2018-05-09 DIAGNOSIS — I10 Essential (primary) hypertension: Secondary | ICD-10-CM | POA: Diagnosis not present

## 2018-05-09 LAB — BASIC METABOLIC PANEL
BUN/Creatinine Ratio: 16 (ref 10–24)
BUN: 16 mg/dL (ref 8–27)
CO2: 22 mmol/L (ref 20–29)
Calcium: 9.3 mg/dL (ref 8.6–10.2)
Chloride: 106 mmol/L (ref 96–106)
Creatinine, Ser: 1 mg/dL (ref 0.76–1.27)
GFR calc Af Amer: 93 mL/min/{1.73_m2} (ref >59)
GFR calc non Af Amer: 81 mL/min/{1.73_m2} (ref >59)
Glucose: 106 mg/dL — ABNORMAL HIGH (ref 65–99)
Potassium: 4.6 mmol/L (ref 3.5–5.2)
Sodium: 141 mmol/L (ref 134–144)

## 2018-05-09 MED ORDER — SACUBITRIL-VALSARTAN 49-51 MG PO TABS: 1.0000 | ORAL_TABLET | Freq: Two times a day (BID) | ORAL | 6 refills | Status: DC

## 2018-05-09 NOTE — Patient Instructions (Addendum)
We will be checking the following labs today - BMET   Medication Instructions:    Continue with your current medicines. BUT  I am increasing the Entresto to 49/51 mg to take twice a day - you can start this tomorrow.     Testing/Procedures To Be Arranged:  N/A  Follow-Up:   See Dr. Katrinka Blazing in about 2 months.     Other Special Instructions:   Continue to monitor your BP - let us know if BP starts running low - like in the 90's.     If you need a refill on your cardiac medications before your next appointment, please call your pharmacy.   Call the [facility] office at [phone] if you have any questions, problems or concerns.

## 2018-05-09 NOTE — Progress Notes (Signed)
CARDIOLOGY OFFICE NOTE  Date:  05/09/2018    Jason Melendez Date of Birth: [birth year] Medical Record #[id number]  PCP:  Pleas Koch, NP  Cardiologist:  Jennings Books    Chief Complaint  Patient presents with  . Congestive Heart Failure    Follow up visit - seen for Dr. Tamala Julian    History of Present Illness: Jason Melendez is a 62 y.o. male who presents today for a follow up visit. Seen for Dr. Tamala Julian.   He has had prior left brain stroke, high-grade left carotid stenosis treated with surgery by Dr. Trula Slade, asymptomatic systolic heart failure, hypertension, hyperlipidemia,  recurrent episodes of syncope (vasovagal) and chronic combined systolic and diastolic heart failure on guideline directed medical therapy for LV preservation. Presumed etiology of CM is HTN - negative Myoview in February of 2018. He has not had cardiac cath.   Last seen by Dr. Tamala Julian back in March - felt to be doing well. Was referred to the pharmacy here for med titration/BP management.   Admitted at the end of July with recurrent syncope while working at [employer] - felt to be vasovagal in the setting of volume depletion and exertion. Lasix changed to prn. Aldactone was held. Coreg increased given frequency of PVCs noted.  He remained on Hyzaar.   I then saw him about 3 weeks ago for a post hospital visit for the episode of syncope. He had previously been taking Lasix along with his HCTZ in the Hyzaar about 3 times a week due to swelling. He felt like he had been hydrated on the day of the syncopal spell. He was now on higher dose of Coreg. BP was soft. I talked with Dr. Tamala Julian - we added back low dose Lasix, started Entresto and stopped the Hyzaar - plan for repeat echo in a few months and then consider referral to EP and possible cardiac MRI.   Comes in today. Here alone. He has done well since his visit here. Has used Lasix maybe 5 times over the past 3 weeks. BP is good -  running in the 110 and even up to the 130's at home. He is not dizzy or lightheaded. No chest pain. Breathing is good. No syncope. No swelling. He feels like he is doing well on this current regimen.    Past Medical History:  Diagnosis Date  . HTN (hypertension)   . Hyperlipidemia     Past Surgical History:  Procedure Laterality Date  . ENDARTERECTOMY Left 10/16/2016   Procedure: ENDARTERECTOMY CAROTID;  Surgeon: Serafina Mitchell, MD;  Location: [facility];  Service: Vascular;  Laterality: Left;  . HERNIA REPAIR    . PATCH ANGIOPLASTY Left 10/16/2016   Procedure: PATCH ANGIOPLASTY USING Rueben Bash BIOLOGIC PATCH;  Surgeon: Serafina Mitchell, MD;  Location[facility];  Service: Vascular;  Laterality: Left;     Medications: Current Meds  Medication Sig  . acetaminophen (TYLENOL 8 HOUR ARTHRITIS PAIN) 650 MG CR tablet Take 650 mg by mouth 2 (two) times daily.  Marland Kitchen aspirin 325 MG tablet Take 1 tablet (325 mg total) by mouth daily.  Marland Kitchen atorvastatin (LIPITOR) 40 MG tablet Take 40 mg by mouth daily at 6 PM.  . blood glucose meter kit and supplies Dispense based on patient and insurance preference. Use to check blood sugar once daily as directed. (FOR ICD-10 E10.9, E11.9).  . carvedilol (COREG) 12.5 MG tablet Take 1 tablet (12.5 mg total) by mouth  2 (two) times daily with a meal.  . escitalopram (LEXAPRO) 10 MG tablet Take 10 mg by mouth at bedtime.  . furosemide (LASIX) 20 MG tablet Take 1 tablet (20 mg total) by mouth daily as needed for fluid or edema (weight gain of 3 or more pounds overnight).  . metFORMIN (GLUCOPHAGE) 500 MG tablet Take 1 tablet (500 mg total) by mouth 2 (two) times daily with a meal.  . spironolactone (ALDACTONE) 25 MG tablet Take 1 tablet (25 mg total) by mouth daily.  . [DISCONTINUED] sacubitril-valsartan (ENTRESTO) 24-26 MG Take 1 tablet by mouth 2 (two) times daily.     Allergies: Allergies  Allergen Reactions  . Hydralazine Hcl     Sudden drop in BP    Social History: The  patient  reports that he has never smoked. He has never used smokeless tobacco. He reports that he does not drink alcohol or use drugs.   Family History: The patient's family history includes Diabetes in his father and mother; Heart disease (age of onset: 63) in his father; Hypertension in his sister; Kidney disease in his father; Kidney failure in his father.   Review of Systems: Please see the history of present illness.   Otherwise, the review of systems is positive for none.   All other systems are reviewed and negative.   Physical Exam: VS:  BP 100/60 (BP Location: Left Arm, Patient Position: Sitting, Cuff Size: Normal)   Pulse 63   Ht '6\' 1"'  (1.854 m)   Wt 261 lb 6.4 oz (118.6 kg)   SpO2 95% Comment: at rest  BMI 34.49 kg/m  .  BMI Body mass index is 34.49 kg/m.  Wt Readings from Last 3 Encounters:  05/09/18 261 lb 6.4 oz (118.6 kg)  04/18/18 260 lb 12.8 oz (118.3 kg)  03/30/18 259 lb 3.2 oz (117.6 kg)   Repeat BP by me is 130/60  General: Pleasant. Well developed, well nourished and in no acute distress.   HEENT: Normal.  Neck: Supple, no JVD, carotid bruits, or masses noted.  Cardiac: Regular rate and rhythm. No murmurs, rubs, or gallops. No edema.  Respiratory:  Lungs are clear to auscultation bilaterally with normal work of breathing.  GI: Soft and nontender.  MS: No deformity or atrophy. Gait and ROM intact.  Skin: Warm and dry. Color is normal.  Neuro:  Strength and sensation are intact and no gross focal deficits noted.  Psych: Alert, appropriate and with normal affect.   LABORATORY DATA:  EKG:  EKG is not ordered today.  Lab Results  Component Value Date   WBC 6.3 04/18/2018   HGB 12.4 (L) 04/18/2018   HCT 38.1 04/18/2018   PLT 173 04/18/2018   GLUCOSE 131 (H) 04/25/2018   CHOL 116 03/04/2018   TRIG 206.0 (H) 03/04/2018   HDL 27.10 (L) 03/04/2018   LDLDIRECT 52.0 03/04/2018   LDLCALC 56 01/22/2017   ALT 16 03/30/2018   AST 23 03/30/2018   NA 142  04/25/2018   K 4.5 04/25/2018   CL 106 04/25/2018   CREATININE 1.21 04/25/2018   BUN 19 04/25/2018   CO2 24 04/25/2018   TSH 1.460 07/21/2015   PSA 1.16 01/22/2017   INR 1.06 10/17/2016   HGBA1C 7.2 (H) 03/04/2018     BNP (last 3 results) Recent Labs    03/30/18 1451  BNP 29.0    ProBNP (last 3 results) No results for input(s): PROBNP in the last 8760 hours.   Other Studies Reviewed Today:  2D Doppler echocardiogram 07/12/17: Study Conclusions  - Left ventricle: The cavity size was mildly dilated. There was mild concentric hypertrophy. Systolic function was moderately reduced. The estimated ejection fraction was in the range of 35% to 40%. Wall motion was normal; there were no regional wall motion abnormalities. Doppler parameters are consistent with abnormal left ventricular relaxation (grade 1 diastolic dysfunction). Doppler parameters are consistent with elevated ventricular end-diastolic filling pressure. - Mitral valve: Mildly thickened leaflets . There was mild regurgitation. - Left atrium: The atrium was mildly dilated. - Right ventricle: Systolic function was normal. - Right atrium: The atrium was normal in size. - Tricuspid valve: There was mild regurgitation. - Pulmonary arteries: Systolic pressure was within the normal range. - Pericardium, extracardiac: There was no pericardial effusion.  Impressions:  - Since the last study on 10/13/2016 LVEF has decreased from 40-45% to 35-40%.   MYOVIEW IMPRESSION 10/2016: 1. Mild attenuation of the left ventricular apex without evidence of inducible myocardial ischemia.  2. Global left ventricular hypokinesis with LV cavity dilatation.  3. Left ventricular ejection fraction 34%  4. Non invasive risk stratification*: Intermediate  *2012 Appropriate Use Criteria for Coronary Revascularization Focused Update: J Am Coll Cardiol.  5397;67(3):419-379. http://content.[email or web address].aspx?articleid=1201161   Electronically Signed By: Aletta Edouard M.D. On: 10/15/2016 15:01    Assessment/Plan:  1. Recurrent syncope - he has had what has been defined as vasovagal syncope in the past - has not recurred with recent med changes. Will continue to follow.   2. Chronic combined systolic & diastolic HF - presumed related to HTN. Discussed at last visit with Dr. Tamala Julian - we have changed him over to Entresto - BP is good - actually higher at home - will try increasing him to the next dose - BMET today. Plan to get limited echo in a few months and then consider referral to EP if EF is less than 40%.   3. HTN - BP looks good - he will continue to follow. Increasing Entresto today.   4. DM  5. Prior stroke with carotid endarterectomy - he remains on high dose aspirin per [facility].   6. HLD - on statin therapy.   Current medicines are reviewed with the patient today.  The patient does not have concerns regarding medicines other than what has been noted above.  The following changes have been made:  See above.  Labs/ tests ordered today include:    Orders Placed This Encounter  Procedures  . Basic metabolic panel     Disposition:   FU with Dr. Tamala Julian in 2 months. I am happy to see back as needed.   Patient is agreeable to this plan and will call if any problems develop in the interim.   SignedTruitt Merle, NP  05/09/2018 11:45 AM  [facility] [address] [place], [place]  [address] Phone: [phone] Fax: [phone]

## 2018-05-17 ENCOUNTER — Other Ambulatory Visit: Payer: Self-pay | Admitting: Primary Care

## 2018-05-24 ENCOUNTER — Other Ambulatory Visit: Payer: Self-pay | Admitting: Primary Care

## 2018-06-07 ENCOUNTER — Other Ambulatory Visit: Payer: Self-pay | Admitting: Interventional Cardiology

## 2018-06-13 DIAGNOSIS — Z23 Encounter for immunization: Secondary | ICD-10-CM | POA: Diagnosis not present

## 2018-06-16 ENCOUNTER — Ambulatory Visit: Payer: BLUE CROSS/BLUE SHIELD | Admitting: Primary Care

## 2018-06-16 ENCOUNTER — Encounter: Payer: Self-pay | Admitting: Primary Care

## 2018-06-16 VITALS — BP 114/68 | HR 73 | Temp 98.2°F | Ht 73.0 in | Wt 267.5 lb

## 2018-06-16 DIAGNOSIS — E119 Type 2 diabetes mellitus without complications: Secondary | ICD-10-CM | POA: Diagnosis not present

## 2018-06-16 DIAGNOSIS — Z23 Encounter for immunization: Secondary | ICD-10-CM | POA: Diagnosis not present

## 2018-06-16 LAB — POCT GLYCOSYLATED HEMOGLOBIN (HGB A1C): Hemoglobin A1C: 6 % — AB (ref 4.0–5.6)

## 2018-06-16 NOTE — Assessment & Plan Note (Signed)
A1C improved significantly from July and is now at goal of 6.0. Continue Metformin 500 mg BID.   Pneumonia vaccination provided today. Managed on ARB and statin. Foot exam next visit. Eye exam pending for next month.  Discussed to work on diet, limit sweets and processed food. Follow up in 6 months.

## 2018-06-16 NOTE — Addendum Note (Signed)
Addended by: Tawnya Crook on: 06/16/2018 12:17 PM   Modules accepted: Orders

## 2018-06-16 NOTE — Progress Notes (Signed)
Subjective:    Patient ID: Jason Melendez, male    DOB: 19-Jan-1956, 62 y.o.   MRN: [id number]  HPI  Jason Melendez is a 62 year old male who presents today for follow up of diabetes.  Current medications include: Metformin 500 mg BID  He is checking his blood glucose one time daily and is getting readings of: AM fasting: 120-130's  Last A1C: 7.2 in July 2019, 6.0 in October 2019 Last Eye Exam: Due in November Pneumonia Vaccination: Due today ACE/ARB: Entresto Statin: atrovastatin   Diet currently consists of:  Breakfast: 2 hard boiled eggs, chicken/turkey bacon, toast Lunch: Chicken sandwich, fried rice, fast food every other week Dinner: Meat, vegetable, starch Snacks: Chips, snack cakes Desserts: Daily  Beverages: Flavored water, diet soda  Exercise: He is not exercising due to chronic knee pain   Review of Systems  Respiratory: Negative for shortness of breath.   Cardiovascular: Negative for chest pain.  Neurological: Negative for dizziness and headaches.       Past Medical History:  Diagnosis Date  . HTN (hypertension)   . Hyperlipidemia      Social History   Socioeconomic History  . Marital status: Married    Spouse name: Not on file  . Number of children: Not on file  . Years of education: Not on file  . Highest education level: Not on file  Occupational History  . Not on file  Social Needs  . Financial resource strain: Not on file  . Food insecurity:    Worry: Not on file    Inability: Not on file  . Transportation needs:    Medical: Not on file    Non-medical: Not on file  Tobacco Use  . Smoking status: Never Smoker  . Smokeless tobacco: Never Used  Substance and Sexual Activity  . Alcohol use: No    Alcohol/week: 0.0 standard drinks  . Drug use: No  . Sexual activity: Not Currently  Lifestyle  . Physical activity:    Days per week: Not on file    Minutes per session: Not on file  . Stress: Not on file  Relationships  .  Social connections:    Talks on phone: Not on file    Gets together: Not on file    Attends religious service: Not on file    Active member of club or organization: Not on file    Attends meetings of clubs or organizations: Not on file    Relationship status: Not on file  . Intimate partner violence:    Fear of current or ex partner: Not on file    Emotionally abused: Not on file    Physically abused: Not on file    Forced sexual activity: Not on file  Other Topics Concern  . Not on file  Social History Narrative   Marital status: married      Children: 2 daughters      Lives:  With wife      Employment: printing work      Tobacco: none      Alcohol: one beer every three months      Drugs:  None      Exercise:  No formal exercise; walks up three flights of stairs several times per day at work.   Lives at home.  Works for [employer].     2 yrs college.      Past Surgical History:  Procedure Laterality Date  . ENDARTERECTOMY Left  10/16/2016   Procedure: ENDARTERECTOMY CAROTID;  Surgeon: Serafina Mitchell, MD;  Location: [place];  Service: Vascular;  Laterality: Left;  . HERNIA REPAIR    . PATCH ANGIOPLASTY Left 10/16/2016   Procedure: PATCH ANGIOPLASTY USING Rueben Bash BIOLOGIC PATCH;  Surgeon: Serafina Mitchell, MD;  Location: [facility] OR;  Service: Vascular;  Laterality: Left;    Family History  Problem Relation Age of Onset  . Diabetes Mother   . Heart disease Father 61       CABG  . Diabetes Father   . Kidney disease Father   . Kidney failure Father   . Hypertension Sister     Allergies  Allergen Reactions  . Hydralazine Hcl     Sudden drop in BP    Current Outpatient Medications on File Prior to Visit  Medication Sig Dispense Refill  . acetaminophen (TYLENOL 8 HOUR ARTHRITIS PAIN) 650 MG CR tablet Take 650 mg by mouth 2 (two) times daily.    Marland Kitchen aspirin 325 MG tablet Take 1 tablet (325 mg total) by mouth daily. 60 tablet 0  . atorvastatin (LIPITOR) 40 MG tablet  Take 40 mg by mouth daily at 6 PM.    . Blood Glucose Monitoring Suppl (ACCU-CHEK GUIDE ME) w/Device KIT USE TO CHECK BLOOD SUGAR ONCE DAILY AS DIRECTED 1 kit 0  . carvedilol (COREG) 12.5 MG tablet Take 1 tablet (12.5 mg total) by mouth 2 (two) times daily with a meal. 180 tablet 3  . escitalopram (LEXAPRO) 10 MG tablet Take 10 mg by mouth at bedtime.    . furosemide (LASIX) 20 MG tablet Take 1 tablet (20 mg total) by mouth daily as needed for fluid or edema (weight gain of 3 or more pounds overnight). 90 tablet 3  . metFORMIN (GLUCOPHAGE) 500 MG tablet Take 1 tablet (500 mg total) by mouth 2 (two) times daily with a meal. 180 tablet 3  . sacubitril-valsartan (ENTRESTO) 49-51 MG Take 1 tablet by mouth 2 (two) times daily. 60 tablet 6  . spironolactone (ALDACTONE) 25 MG tablet Take 1 tablet (25 mg total) by mouth daily. 30 tablet 11   No current facility-administered medications on file prior to visit.     BP 114/68   Pulse 73   Temp 98.2 F (36.8 C) (Oral)   Ht '6\' 1"'  (1.854 m)   Wt 267 lb 8 oz (121.3 kg)   SpO2 97%   BMI 35.29 kg/m    Objective:   Physical Exam  Constitutional: He appears well-nourished.  Neck: Neck supple.  Cardiovascular: Normal rate and regular rhythm.  Respiratory: Effort normal and breath sounds normal.  Skin: Skin is warm and dry.           Assessment & Plan:

## 2018-06-16 NOTE — Patient Instructions (Signed)
Continue to work on [employer]. Limit sweets and processed junk food.   Start exercising. You should be getting 150 minutes of moderate intensity exercise weekly.  Continue metformin 500 mg twice daily as prescribed.  Please schedule a follow up appointment in 6 months for diabetes check.   It was a pleasure to see you today!

## 2018-07-01 ENCOUNTER — Other Ambulatory Visit: Payer: Self-pay | Admitting: Primary Care

## 2018-07-08 ENCOUNTER — Other Ambulatory Visit: Payer: Self-pay | Admitting: Primary Care

## 2018-07-10 NOTE — Progress Notes (Signed)
Cardiology Office Note:    Date:  07/11/2018   ID:  Jason Melendez, DOB Jan 24, 1956, MRN [id number]  PCP:  Pleas Koch, NP  Cardiologist:  Sinclair Grooms, MD   Referring MD: Pleas Koch, NP   Chief Complaint  Patient presents with  . Atrial Fibrillation  . Coronary Artery Disease    History of Present Illness:    Jason Melendez is a 62 y.o. male with a hx of left brain stroke, high-grade left carotid stenosis treated with surgery by Dr. Trula Slade, asymptomatic systolic heart failure, hypertension, hyperlipidemia, and recurrent episodes of syncope (vasovagal).  The patient has chronic combined systolic and diastolic heart failure on guideline directed medical therapy for LV preservation.  Hospitalization in July for syncope felt to be related to volume contraction with neurocardiogenic component with nausea/diaphoresis.  The patient was taking both furosemide and HCTZ.  At that time noted to have renal profile consistent with volume contraction.  He has had no further difficulty since that time.  He denies chest pain.  No episodes of orthopnea, PND, edema, orthopnea.  He occasionally has significant fluctuations in weight in which case he may use a 20 mg furosemide tablet to bring it back to baseline.  This happens infrequently over the last 3 months.  [place]liant with the current medical regimen.  Served 6 years in the [employer]  We expressed gratitude for his service.  Past Medical History:  Diagnosis Date  . HTN (hypertension)   . Hyperlipidemia     Past Surgical History:  Procedure Laterality Date  . ENDARTERECTOMY Left 10/16/2016   Procedure: ENDARTERECTOMY CAROTID;  Surgeon: Serafina Mitchell, MD;  Location: [place];  Service: Vascular;  Laterality: Left;  . HERNIA REPAIR    . PATCH ANGIOPLASTY Left 10/16/2016   Procedure: PATCH ANGIOPLASTY USING Rueben Bash BIOLOGIC PATCH;  Surgeon: Serafina Mitchell, MD;  Location: [facility] OR;  Service: Vascular;  Laterality:  Left;    Current Medications: Current Meds  Medication Sig  . ACCU-CHEK FASTCLIX LANCETS MISC USE TO CHECK BLOOD SUGARS ONCE DAILY AS DIRECTED  . ACCU-CHEK GUIDE test strip USE TO CHECK BLOOD SUGARS ONCE DAILY AS DIRECTED  . acetaminophen (TYLENOL 8 HOUR ARTHRITIS PAIN) 650 MG CR tablet Take 650 mg by mouth 2 (two) times daily.  Marland Kitchen aspirin 325 MG tablet Take 1 tablet (325 mg total) by mouth daily.  Marland Kitchen atorvastatin (LIPITOR) 40 MG tablet Take 40 mg by mouth daily at 6 PM.  . Blood Glucose Monitoring Suppl (ACCU-CHEK GUIDE ME) w/Device KIT USE TO CHECK BLOOD SUGAR ONCE DAILY AS DIRECTED  . carvedilol (COREG) 12.5 MG tablet Take 1 tablet (12.5 mg total) by mouth 2 (two) times daily with a meal.  . escitalopram (LEXAPRO) 10 MG tablet Take 10 mg by mouth at bedtime.  . furosemide (LASIX) 20 MG tablet Take 1 tablet (20 mg total) by mouth daily as needed for fluid or edema (weight gain of 3 or more pounds overnight).  . metFORMIN (GLUCOPHAGE) 500 MG tablet Take 1 tablet (500 mg total) by mouth 2 (two) times daily with a meal.  . sacubitril-valsartan (ENTRESTO) 49-51 MG Take 1 tablet by mouth 2 (two) times daily.     Allergies:   Hydralazine hcl   Social History   Socioeconomic History  . Marital status: Married    Spouse name: Not on file  . Number of children: Not on file  . Years of education: Not on file  . Highest  education level: Not on file  Occupational History  . Not on file  Social Needs  . Financial resource strain: Not on file  . Food insecurity:    Worry: Not on file    Inability: Not on file  . Transportation needs:    Medical: Not on file    Non-medical: Not on file  Tobacco Use  . Smoking status: Never Smoker  . Smokeless tobacco: Never Used  Substance and Sexual Activity  . Alcohol use: No    Alcohol/week: 0.0 standard drinks  . Drug use: No  . Sexual activity: Not Currently  Lifestyle  . Physical activity:    Days per week: Not on file    Minutes per session:  Not on file  . Stress: Not on file  Relationships  . Social connections:    Talks on phone: Not on file    Gets together: Not on file    Attends religious service: Not on file    Active member of club or organization: Not on file    Attends meetings of clubs or organizations: Not on file    Relationship status: Not on file  Other Topics Concern  . Not on file  Social History Narrative   Marital status: married      Children: 2 daughters      Lives:  With wife      Employment: printing work      Tobacco: none      Alcohol: one beer every three months      Drugs:  None      Exercise:  No formal exercise; walks up three flights of stairs several times per day at work.   Lives at home.  Works for [employer].     2 yrs college.       Family History: The patient's family history includes Diabetes in his father and mother; Heart disease (age of onset: 19) in his father; Hypertension in his sister; Kidney disease in his father; Kidney failure in his father.  ROS:   Please see the history of present illness.    Aerobic activity can be limited by bilateral knee discomfort.  All other systems reviewed and are negative.  EKGs/Labs/Other Studies Reviewed:    The following studies were reviewed today: ECHOCARDIOGRAM 07/2017: Study Conclusions  - Left ventricle: The cavity size was mildly dilated. There was   mild concentric hypertrophy. Systolic function was moderately   reduced. The estimated ejection fraction was in the range of 35%   to 40%. Wall motion was normal; there were no regional wall   motion abnormalities. Doppler parameters are consistent with   abnormal left ventricular relaxation (grade 1 diastolic   dysfunction). Doppler parameters are consistent with elevated   ventricular end-diastolic filling pressure. - Mitral valve: Mildly thickened leaflets . There was mild   regurgitation. - Left atrium: The atrium was mildly dilated. - Right ventricle:  Systolic function was normal. - Right atrium: The atrium was normal in size. - Tricuspid valve: There was mild regurgitation. - Pulmonary arteries: Systolic pressure was within the normal   range. - Pericardium, extracardiac: There was no pericardial effusion.  Impressions:  - Since the last study on 10/13/2016 LVEF has decreased from 40-45%   to 35-40%.  EKG:  EKG is not ordered today.  The ekg performed March 30, 2018 demonstrated ventricular bigeminy, sinus rhythm, which was not different than the prior tracing.  Recent Labs: 03/30/2018: ALT 16; B Natriuretic Peptide 29.0; Magnesium  2.1 04/18/2018: Hemoglobin 12.4; Platelets 173 05/09/2018: BUN 16; Creatinine, Ser 1.00; Potassium 4.6; Sodium 141  Recent Lipid Panel    Component Value Date/Time   CHOL 116 03/04/2018 0745   TRIG 206.0 (H) 03/04/2018 0745   HDL 27.10 (L) 03/04/2018 0745   CHOLHDL 4 03/04/2018 0745   VLDL 41.2 (H) 03/04/2018 0745   LDLCALC 56 01/22/2017 0755   LDLDIRECT 52.0 03/04/2018 0745    Physical Exam:    VS:  BP 134/78   Pulse 74   Ht '6\' 1"'  (1.854 m)   Wt 269 lb 3.2 oz (122.1 kg)   SpO2 94%   BMI 35.52 kg/m     Wt Readings from Last 3 Encounters:  07/11/18 269 lb 3.2 oz (122.1 kg)  06/16/18 267 lb 8 oz (121.3 kg)  05/09/18 261 lb 6.4 oz (118.6 kg)     GEN: Moderate obesity, well developed in no acute distress HEENT: Normal NECK: No JVD. LYMPHATICS: No lymphadenopathy CARDIAC: RRR, nomurmur, no gallop, no  edema. VASCULAR: 2+ bilateral radial and posterior tibial pulses.  No bruits. RESPIRATORY:  Clear to auscultation without rales, wheezing or rhonchi  ABDOMEN: Soft, non-tender, non-distended, No pulsatile mass, MUSCULOSKELETAL: No deformity  SKIN: Warm and dry NEUROLOGIC:  Alert and oriented x 3 PSYCHIATRIC:  Normal affect   ASSESSMENT:    1. Chronic combined systolic and diastolic heart failure ([place])   2. Essential hypertension   3. Other hyperlipidemia   4. Prediabetes   5.  Cerebrovascular accident (CVA) due to thrombosis of precerebral artery ([place])   6. Neurocardiogenic syncope    PLAN:    In order of problems listed above:  1. No evidence of volume overload.  Compliant with medical therapy.  He is on strong guideline directed regimen for systolic dysfunction.  Electrolytes need to be checked at least twice per year given the combination of MRA and ARNI. 2. BP target 130/80.  Sodium impact discussed in detail. 3. Target LDL less than 70. 4. Exercise, diet, and weight loss were discussed his general concepts for control.  Most recent hemoglobin A1c was 6.0. 5. No new neurological complaints. 6. Discussed neurocardiogenic syncope and recognition of prodromal symptoms to overt complete loss of consciousness.  Prodrome with his most recent episode was nausea and diaphoresis for seconds to minutes prior to fainting.  The importance of recumbency with leg elevation until complete resolution of prodrome was discussed in detail.  Guideline directed therapy for left ventricular systolic dysfunction: Angiotensin receptor-neprilysin inhibitor (ARNI)-Entresto; beta-blocker therapy - carvedilol or metoprolol succinate; mineralocorticoid receptor antagonist (MRA) therapy -spironolactone or eplerenone.  These therapies have been shown to improve clinical outcomes including reduction of rehospitalization survival, and acute heart failure.  Overall education and awareness concerning primary/secondary risk prevention was discussed in detail: LDL less than 70, hemoglobin A1c less than 7, blood pressure target less than 130/80 mmHg, >150 minutes of moderate aerobic activity per week, avoidance of smoking, weight control (via diet and exercise), and continued surveillance/management of/for obstructive sleep apnea.  Greater than 50% of the time during this office visit was spent in education, counseling, and coordination of care related to underlying disease process and testing as  outlined.       Medication Adjustments/Labs and Tests Ordered: Current medicines are reviewed at length with the patient today.  Concerns regarding medicines are outlined above.  No orders of the defined types were placed in this encounter.  No orders of the defined types were placed in this encounter.  Patient Instructions  Medication Instructions:  Your physician recommends that you continue on your current medications as directed. Please refer to the Current Medication list given to you today.  If you need a refill on your cardiac medications before your next appointment, please call your pharmacy.   Lab work: None ordered If you have labs (blood work) drawn today and your tests are completely normal, you will receive your results only by: Marland Kitchen MyChart Message (if you have MyChart) OR . A paper copy in the mail If you have any lab test that is abnormal or we need to change your treatment, we will call you to review the results.  Testing/Procedures: None ordered  Follow-Up: Your physician recommends that you follow-up with Truitt Merle, NP on 01/09/19 at 8:00 AM.   At [facility], you and your health needs are our priority.  As part of our continuing mission to provide you with exceptional heart care, we have created designated Provider Care Teams.  These Care Teams include your primary Cardiologist (physician) and Advanced Practice Providers (APPs -  Physician Assistants and Nurse Practitioners) who all work together to provide you with the care you need, when you need it. . You will need a follow up appointment in 1 year.  Please call our office 2 months in advance to schedule this appointment.  You may see Sinclair Grooms, MD or one of the following Advanced Practice Providers on your designated Care Team:   . Truitt Merle, NP . Cecilie Kicks, NP . Kathyrn Drown, NP   Any Other Special Instructions Will Be Listed Below (If Applicable).     Signed, Sinclair Grooms, MD  07/11/2018 10:15 AM    [place]

## 2018-07-11 ENCOUNTER — Ambulatory Visit: Payer: BLUE CROSS/BLUE SHIELD | Admitting: Interventional Cardiology

## 2018-07-11 ENCOUNTER — Encounter: Payer: Self-pay | Admitting: Interventional Cardiology

## 2018-07-11 VITALS — BP 134/78 | HR 74 | Ht 73.0 in | Wt 269.2 lb

## 2018-07-11 DIAGNOSIS — R7303 Prediabetes: Secondary | ICD-10-CM | POA: Diagnosis not present

## 2018-07-11 DIAGNOSIS — I10 Essential (primary) hypertension: Secondary | ICD-10-CM

## 2018-07-11 DIAGNOSIS — R55 Syncope and collapse: Secondary | ICD-10-CM

## 2018-07-11 DIAGNOSIS — E7849 Other hyperlipidemia: Secondary | ICD-10-CM | POA: Diagnosis not present

## 2018-07-11 DIAGNOSIS — I5042 Chronic combined systolic (congestive) and diastolic (congestive) heart failure: Secondary | ICD-10-CM

## 2018-07-11 DIAGNOSIS — I6300 Cerebral infarction due to thrombosis of unspecified precerebral artery: Secondary | ICD-10-CM

## 2018-07-11 NOTE — Patient Instructions (Addendum)
Medication Instructions:  Your physician recommends that you continue on your current medications as directed. Please refer to the Current Medication list given to you today.  If you need a refill on your cardiac medications before your next appointment, please call your pharmacy.   Lab work: None ordered If you have labs (blood work) drawn today and your tests are completely normal, you will receive your results only by: Marland Kitchen MyChart Message (if you have MyChart) OR . A paper copy in the mail If you have any lab test that is abnormal or we need to change your treatment, we will call you to review the results.  Testing/Procedures: None ordered  Follow-Up: Your physician recommends that you follow-up with Norma Fredrickson, NP on 01/09/19 at 8:00 AM.   At [facility], you and your health needs are our priority.  As part of our continuing mission to provide you with exceptional heart care, we have created designated Provider Care Teams.  These Care Teams include your primary Cardiologist (physician) and Advanced Practice Providers (APPs -  Physician Assistants and Nurse Practitioners) who all work together to provide you with the care you need, when you need it. . You will need a follow up appointment in 1 year.  Please call our office 2 months in advance to schedule this appointment.  You may see Lesleigh Noe, MD or one of the following Advanced Practice Providers on your designated Care Team:   . Norma Fredrickson, NP . Nada Boozer, NP . Georgie Chard, NP   Any Other Special Instructions Will Be Listed Below (If Applicable).

## 2018-07-13 ENCOUNTER — Other Ambulatory Visit: Payer: Self-pay | Admitting: Primary Care

## 2018-07-15 ENCOUNTER — Encounter: Payer: Self-pay | Admitting: *Deleted

## 2018-07-15 MED ORDER — GLUCOSE BLOOD VI STRP: ORAL_STRIP | 1 refills | Status: DC

## 2018-07-16 IMAGING — CT CT HEAD W/O CM
3 of 4 series · 17 of 47 positions shown, 20 images · non-contrast
Comparison: None.

CLINICAL DATA: Right leg and arm weakness.  Hypertension.

EXAM:
CT HEAD WITHOUT CONTRAST
TECHNIQUE: Contiguous axial images were obtained from the base of the skull
through the vertex without intravenous contrast.

[Series 201: head w/o, idose (1) · axial · non-contrast · 0.49mm/px · z∈[+73,+203]mm · 11 of 32 slices shown, 14 images]
[im 3/32  brain]
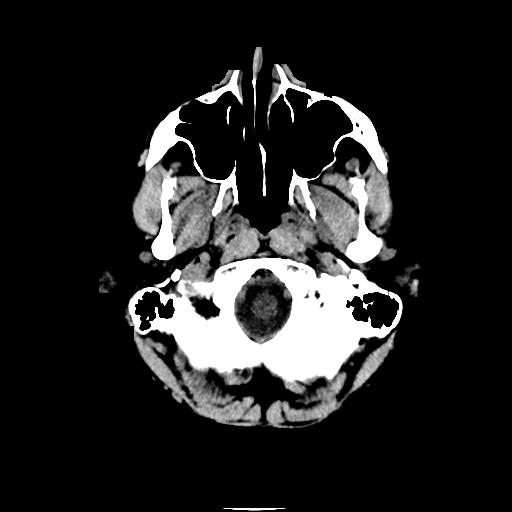
[im 3/32  bone]
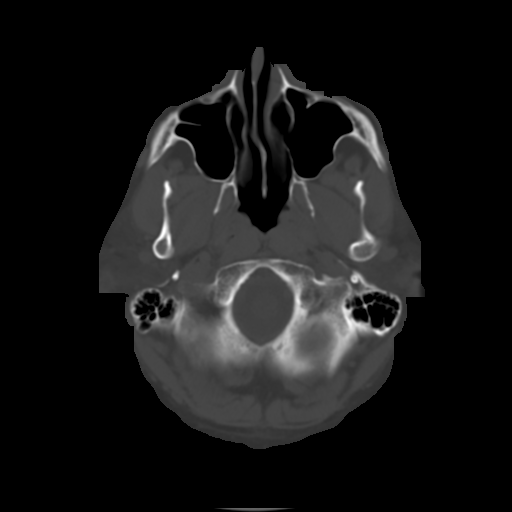
[im 5/32  brain]
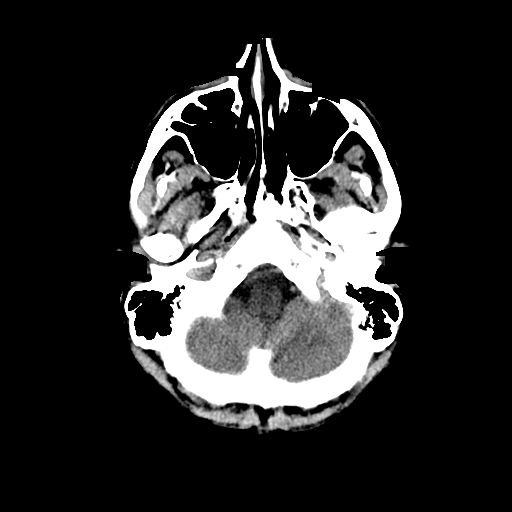
[im 7/32  brain]
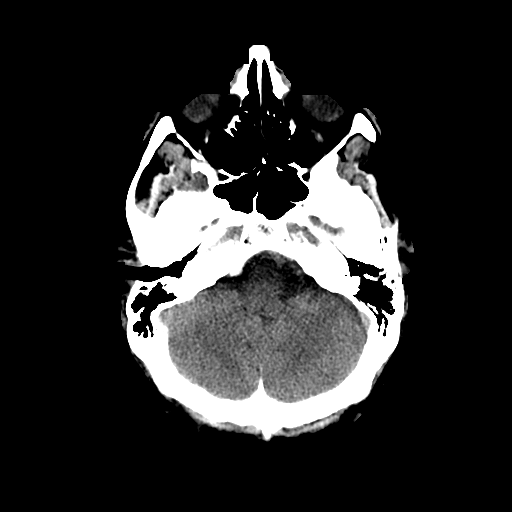
[im 12/32  brain]
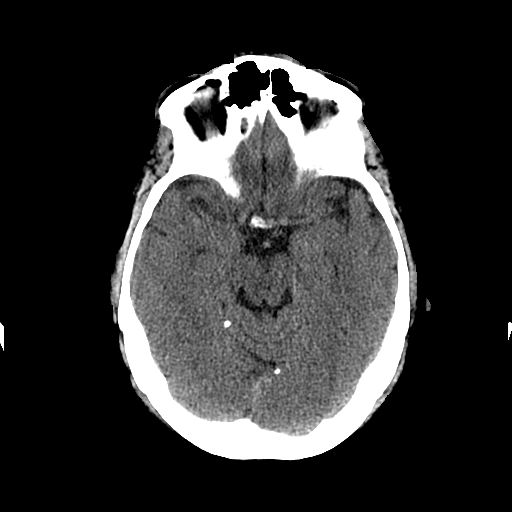
[im 14/32  brain]
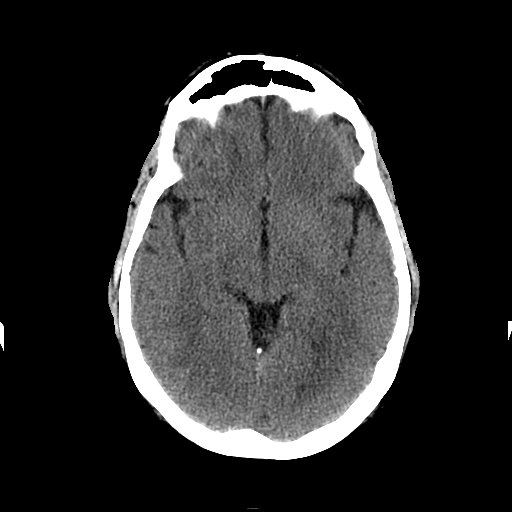
[im 14/32  bone]
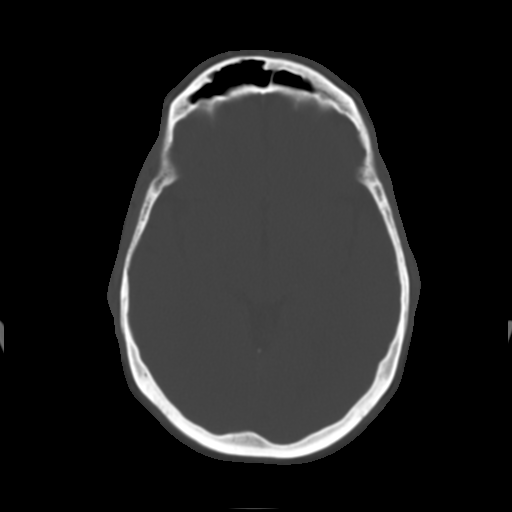
[im 16/32  brain]
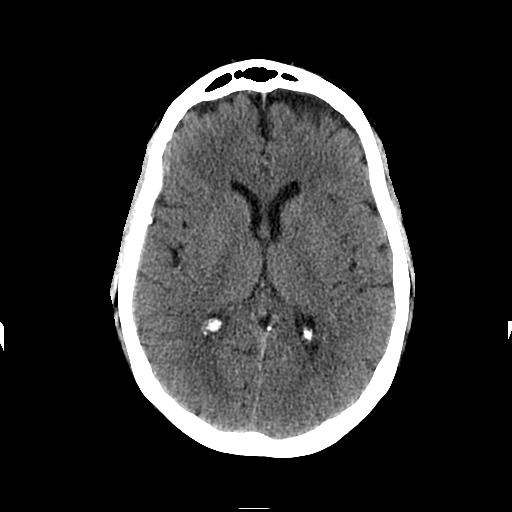
[im 18/32  brain]
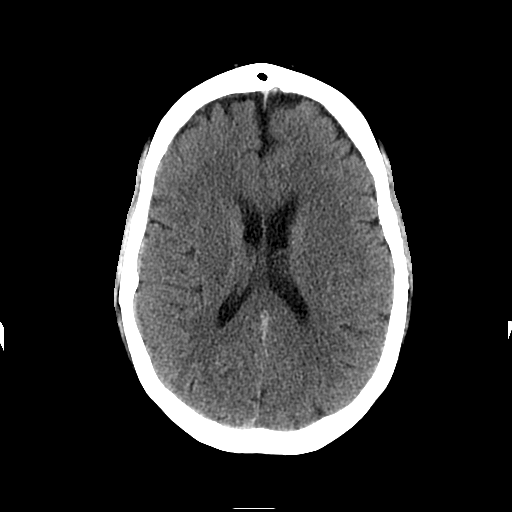
[im 20/32  brain]
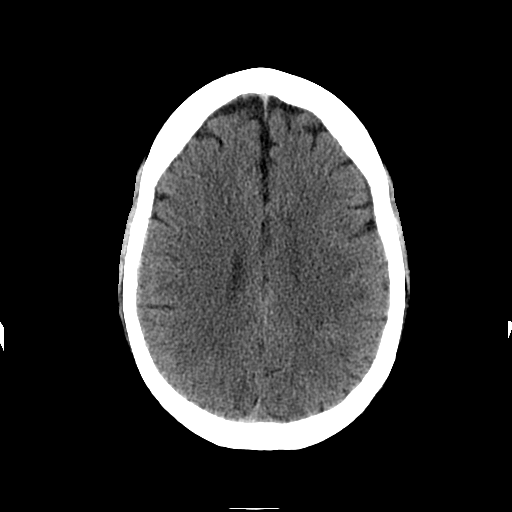
[im 25/32  brain]
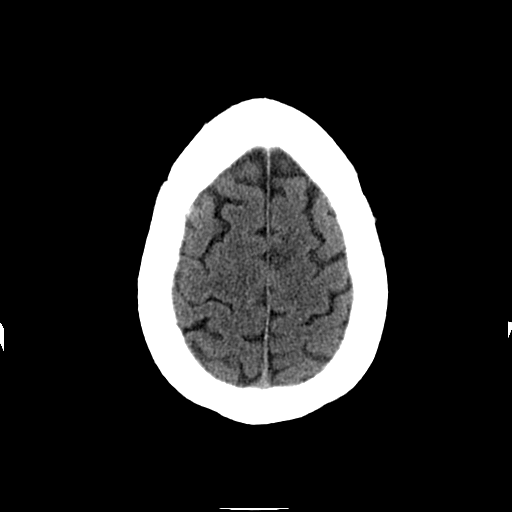
[im 25/32  bone]
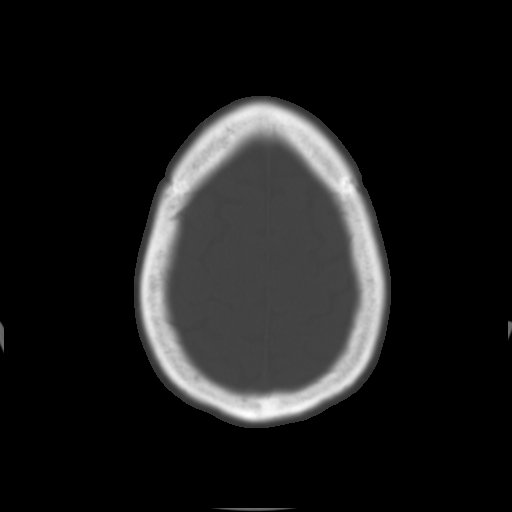
[im 27/32  brain]
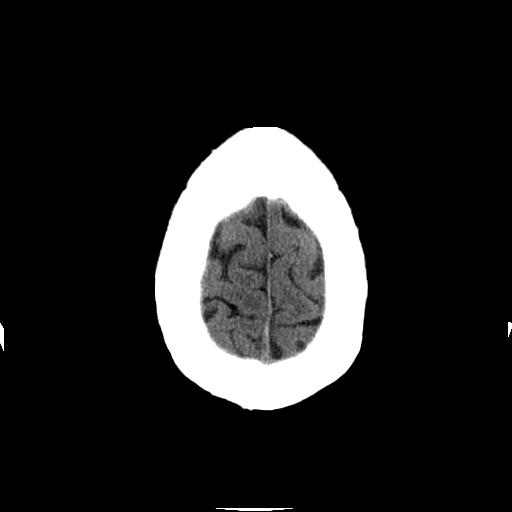
[im 29/32  brain]
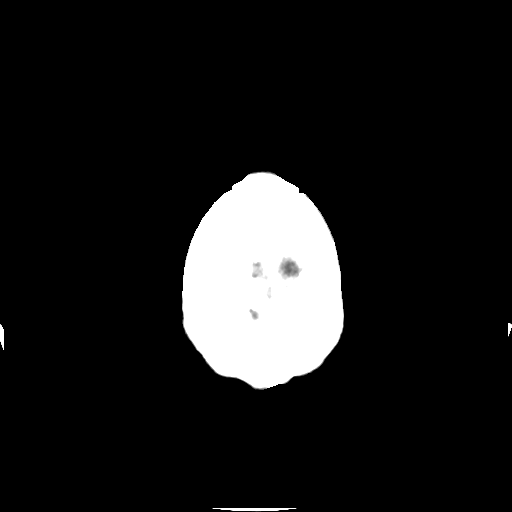

[Series 203: coronal st, idose (1) · coronal · 0.40mm/px · 3 of 78 slices shown]
[im 26/78  brain]
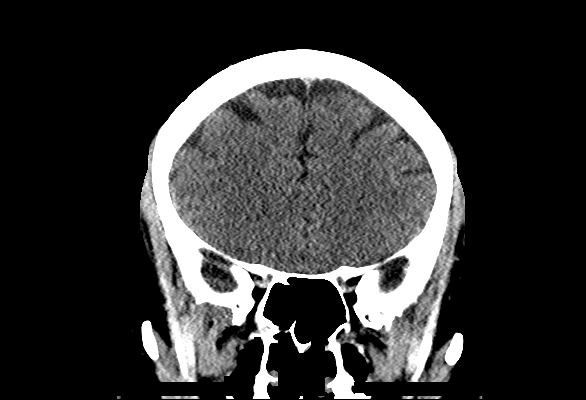
[im 35/78  brain]
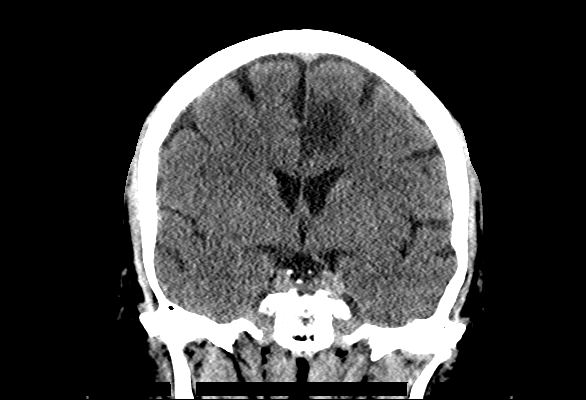
[im 43/78  brain]
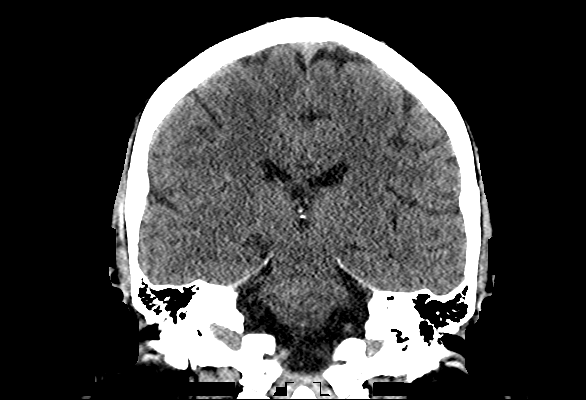

[Series 204: sagittal st, idose (1) · sagittal · 0.40mm/px · 3 of 78 slices shown]
[im 26/78  brain]
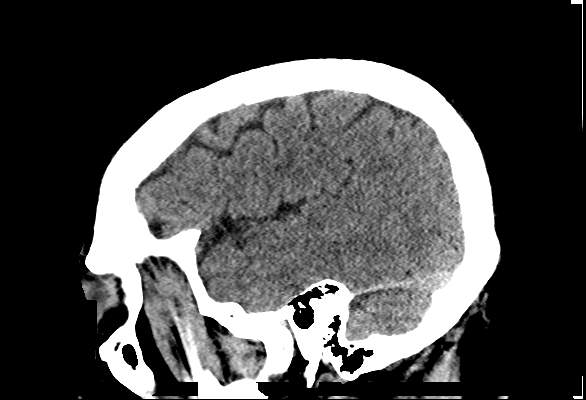
[im 39/78  brain]
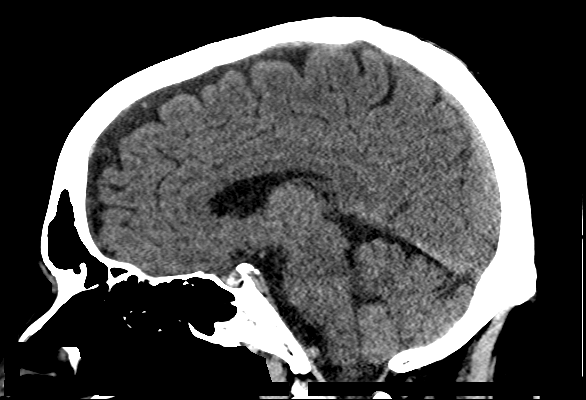
[im 52/78  brain]
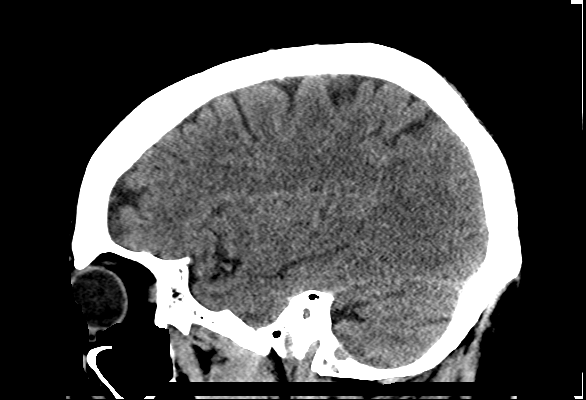

[17 of 47 positions shown; findings below may reference images not displayed]

FINDINGS: Brain: No mass lesion, intraparenchymal hemorrhage or extra-axial
collection. No evidence of acute cortical infarct. Brain parenchyma
and CSF-containing spaces are normal for age. Left parafalcine
arachnoid cyst.

Vascular: No hyperdense vessel or unexpected calcification.

Skull: Normal visualized skull base, calvarium and extracranial soft
tissues.

Sinuses/Orbits: No sinus fluid levels or advanced mucosal
thickening. No mastoid effusion. Normal orbits.
IMPRESSION: No acute intracranial abnormality.

## 2018-08-12 ENCOUNTER — Other Ambulatory Visit: Payer: Self-pay | Admitting: Primary Care

## 2018-10-02 DIAGNOSIS — J00 Acute nasopharyngitis [common cold]: Secondary | ICD-10-CM | POA: Diagnosis not present

## 2018-10-25 ENCOUNTER — Other Ambulatory Visit: Payer: Self-pay | Admitting: Interventional Cardiology

## 2018-11-16 ENCOUNTER — Other Ambulatory Visit: Payer: Self-pay | Admitting: Nurse Practitioner

## 2019-01-09 ENCOUNTER — Ambulatory Visit: Payer: BLUE CROSS/BLUE SHIELD | Admitting: Nurse Practitioner

## 2019-01-16 ENCOUNTER — Ambulatory Visit: Payer: BLUE CROSS/BLUE SHIELD | Admitting: Nurse Practitioner

## 2019-01-24 ENCOUNTER — Telehealth: Payer: Self-pay | Admitting: *Deleted

## 2019-01-24 ENCOUNTER — Telehealth: Payer: BLUE CROSS/BLUE SHIELD | Admitting: Nurse Practitioner

## 2019-01-24 NOTE — Progress Notes (Signed)
Telehealth Visit     Virtual Visit via Video Note   This visit type was conducted due to national recommendations for restrictions regarding the COVID-19 Pandemic (e.g. social distancing) in an effort to limit this patient's exposure and mitigate transmission in our community.  Due to his co-morbid illnesses, this patient is at least at moderate risk for complications without adequate follow up.  This format is felt to be most appropriate for this patient at this time.  All issues noted in this document were discussed and addressed.  A limited physical exam was performed with this format.  Please refer to the patient's chart for his consent to telehealth for [facility].   Evaluation Performed:  Follow-up visit  This visit type was conducted due to national recommendations for restrictions regarding the COVID-19 Pandemic (e.g. social distancing).  This format is felt to be most appropriate for this patient at this time.  All issues noted in this document were discussed and addressed.  No physical exam was performed (except for noted visual exam findings with Video Visits).  Please refer to the patient's chart (MyChart message for video visits and phone note for telephone visits) for the patient's consent to telehealth for [facility].  Date:  01/25/2019   ID:  Jason Melendez, DOB 1955-11-15, MRN [id number]  Patient Location:  Work  Provider location:   Home  PCP:  Pleas Koch, NP  Cardiologist:  Servando Snare Sinclair Grooms, MD  Electrophysiologist:  None   Chief Complaint:  Follow up visit  History of Present Illness:    Jason Melendez is a 63 y.o. male who presents via audio/video conferencing for a telehealth visit today.  Seen for Dr. Tamala Julian.   He has had prior left brain stroke,high-grade left carotid stenosis treated with surgery by Dr. 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 He has not had cardiac cath.   Seen by Dr. Tamala Julian back in March of 2019 - felt to be doing well. Was referred to the pharmacy here for med titration/BP management.   Admitted at the end of July with recurrent syncope while working at [employer] - felt to be vasovagal in the setting of volume depletion and exertion. Lasix changed to prn. Aldactone was held. Coreg increased given frequency of PVCs noted.He remained on Hyzaar. I then saw him for his post hospital visit for the episode of syncope.  He had previously been taking Lasix along with his HCTZ in the Hyzaar about 3 times a week due to swelling. He felt like he had been hydrated on the day of the syncopal spell. He was now on higher dose of Coreg. BP was soft. I talked with Dr. Tamala Julian - we added back low dose Lasix prn, started Entresto and stopped the Hyzaar.    Last seen by Dr. Tamala Julian back in November. He was doing well. Using very little prn Lasix.   The patient does not have symptoms concerning for COVID-19 infection (fever, chills, cough, or new shortness of breath).   Seen today via Doximity video. He has consented for this visit. He is doing well with no concerns. Needs his medicines refilled today. Some stress - his sister - was Dr. Thompson Caul nurse Angela Nevin) e- has had MI and had CABG - she  is doing well. His father in law who has dementia fell and broke his hip - he is 82. No chest pain. Breathing is ok unless he really exerts. He is back on the archery circuit. Not really dizzy and has had no recurrent syncope. He is on a 3 times a week regimen with his Lasix. Weight is stable by his scales. No swelling. He feels like he is doing well.   Past Medical History:  Diagnosis Date   HTN (hypertension)    Hyperlipidemia    Past Surgical History:    Procedure Laterality Date   ENDARTERECTOMY Left 10/16/2016   Procedure: ENDARTERECTOMY CAROTID;  Surgeon: Serafina Mitchell, MD;  Location: [facility] OR;  Service: Vascular;  Laterality: Left;   HERNIA REPAIR     PATCH ANGIOPLASTY Left 10/16/2016   Procedure: PATCH ANGIOPLASTY USING Rueben Bash BIOLOGIC PATCH;  Surgeon: Serafina Mitchell, MD;  Location: [facility] OR;  Service: Vascular;  Laterality: Left;     Current Meds  Medication Sig   ACCU-CHEK FASTCLIX LANCETS MISC USE TO CHECK BLOOD SUGARS ONCE DAILY AS DIRECTED   acetaminophen (TYLENOL 8 HOUR ARTHRITIS PAIN) 650 MG CR tablet Take 650 mg by mouth 2 (two) times daily.   aspirin 325 MG tablet Take 1 tablet (325 mg total) by mouth daily.   atorvastatin (LIPITOR) 40 MG tablet TAKE 1 TABLET BY MOUTH DAILY AT 6 PM.   Blood Glucose Monitoring Suppl (ACCU-CHEK GUIDE ME) w/Device KIT USE TO CHECK BLOOD SUGAR ONCE DAILY AS DIRECTED   carvedilol (COREG) 12.5 MG tablet Take 1 tablet (12.5 mg total) by mouth 2 (two) times daily with a meal.   escitalopram (LEXAPRO) 10 MG tablet Take 10 mg by mouth at bedtime.   furosemide (LASIX) 20 MG tablet Take 20 mg by mouth 3 (three) times a week. Mon, Wed, and Fri   glucose blood (ACCU-CHEK GUIDE) test strip Accu-Chek Guide Use as instructed to test blood sugar up to 2 times daily.   metFORMIN (GLUCOPHAGE) 500 MG tablet Take 1 tablet (500 mg total) by mouth 2 (two) times daily with a meal.   sacubitril-valsartan (ENTRESTO) 49-51 MG Take 1 tablet by mouth 2 (two) times daily.   spironolactone (ALDACTONE) 25 MG tablet Take 1 tablet (25 mg total) by mouth daily.   [DISCONTINUED] carvedilol (COREG) 12.5 MG tablet Take 1 tablet (12.5 mg total) by mouth 2 (two) times daily with a meal.   [DISCONTINUED] ENTRESTO 49-51 MG TAKE 1 TABLET BY MOUTH TWICE A DAY   [DISCONTINUED] spironolactone (ALDACTONE) 25 MG tablet TAKE 1 TABLET BY MOUTH EVERY DAY     Allergies:   Hydralazine and Hydralazine hcl   Social History    Tobacco Use   Smoking status: Never Smoker   Smokeless tobacco: Never Used  Substance Use Topics   Alcohol use: No    Alcohol/week: 0.0 standard drinks   Drug use: No     Family Hx: The patient's family history includes Diabetes in his father and mother; Heart disease (age of onset: 66) in his father; Hypertension in his sister; Kidney disease in his father; Kidney failure in his father.  ROS:   Please see the history of present illness.   All other systems reviewed are negative.    Objective:    Vital Signs:  BP 116/67    Pulse 72    Ht '6\' 1"'$  (1.854 m)    Wt 258 lb 8 oz (117.3 kg)    BMI 34.10 kg/m    Wt  Readings from Last 3 Encounters:  01/25/19 258 lb 8 oz (117.3 kg)  07/11/18 269 lb 3.2 oz (122.1 kg)  06/16/18 267 lb 8 oz (121.3 kg)    Alert male in no acute distress. Not short of breath with conversation.    Labs/Other Tests and Data Reviewed:    Lab Results  Component Value Date   WBC 6.3 04/18/2018   HGB 12.4 (L) 04/18/2018   HCT 38.1 04/18/2018   PLT 173 04/18/2018   GLUCOSE 106 (H) 05/09/2018   CHOL 116 03/04/2018   TRIG 206.0 (H) 03/04/2018   HDL 27.10 (L) 03/04/2018   LDLDIRECT 52.0 03/04/2018   LDLCALC 56 01/22/2017   ALT 16 03/30/2018   AST 23 03/30/2018   NA 141 05/09/2018   K 4.6 05/09/2018   CL 106 05/09/2018   CREATININE 1.00 05/09/2018   BUN 16 05/09/2018   CO2 22 05/09/2018   TSH 1.460 07/21/2015   PSA 1.16 01/22/2017   INR 1.06 10/17/2016   HGBA1C 6.0 (A) 06/16/2018     BNP (last 3 results) Recent Labs    03/30/18 1451  BNP 29.0    ProBNP (last 3 results) No results for input(s): PROBNP in the last 8760 hours.    Prior CV studies:    The following studies were reviewed today:  2D Doppler echocardiogram 07/12/17: Study Conclusions  - Left ventricle: The cavity size was mildly dilated. There was mild concentric hypertrophy. Systolic function was moderately reduced. The estimated ejection fraction was in the  range of 35% to 40%. Wall motion was normal; there were no regional wall motion abnormalities. Doppler parameters are consistent with abnormal left ventricular relaxation (grade 1 diastolic dysfunction). Doppler parameters are consistent with elevated ventricular end-diastolic filling pressure. - Mitral valve: Mildly thickened leaflets . There was mild regurgitation. - Left atrium: The atrium was mildly dilated. - Right ventricle: Systolic function was normal. - Right atrium: The atrium was normal in size. - Tricuspid valve: There was mild regurgitation. - Pulmonary arteries: Systolic pressure was within the normal range. - Pericardium, extracardiac: There was no pericardial effusion.  Impressions:  - Since the last study on 10/13/2016 LVEF has decreased from 40-45% to 35-40%.   MYOVIEWIMPRESSION2/2018: 1. Mild attenuation of the left ventricular apex without evidence of inducible myocardial ischemia.  2. Global left ventricular hypokinesis with LV cavity dilatation.  3. Left ventricular ejection fraction 34%  4. Non invasive risk stratification*: Intermediate  *2012 Appropriate Use Criteria for Coronary Revascularization Focused Update: J Am Coll Cardiol. 8563;14(9):702-637. http://content.airportbarriers.com.aspx?articleid=[email or web address]   Electronically Signed By: Aletta Edouard M.D. On: 10/15/2016 15:01    ASSESSMENT & PLAN:    1. History of recurrent syncope - he has had what has been defined as vasovagal syncope in the past - has not recurred with his current regimen of medicines.   2. Chronic combined systolic &diastolic HF - presumed related to HTN. He is on guideline therapy.  Would like to get his echo updated and recheck EF. Will do labs that day as well.    3. HTN - BP looks good - no changes made today. Refills sent in.   4. DM - per PCP - last A1C is 6.  5. Prior stroke with carotid endarterectomy - he remains on  high dose aspirin per [facility].   6. HLD - on statin therapy.Will check labs later this summer.   7. COVID-19 Education: The signs and symptoms of COVID-19 were discussed with the patient and how to seek care for  testing (follow up with PCP or arrange E-visit).  The importance of social distancing, staying at home, hand hygiene and wearing a mask when out in public were discussed today.  Patient Risk:   After full review of this patient's clinical status, I feel that they are at least moderate risk at this time.  Time:   Today, I have spent 12 minutes with the patient with telehealth technology discussing the above issues.     Medication Adjustments/Labs and Tests Ordered: Current medicines are reviewed at length with the patient today.  Concerns regarding medicines are outlined above.   Tests Ordered: No orders of the defined types were placed in this encounter.   Medication Changes: Meds ordered this encounter  Medications   spironolactone (ALDACTONE) 25 MG tablet    Sig: Take 1 tablet (25 mg total) by mouth daily.    Dispense:  90 tablet    Refill:  3    Order Specific Question:   Supervising Provider    Answer:   Martinique, PETER M [[id number]]   sacubitril-valsartan (ENTRESTO) 49-51 MG    Sig: Take 1 tablet by mouth 2 (two) times daily.    Dispense:  180 tablet    Refill:  3    Order Specific Question:   Supervising Provider    Answer:   [place], PETER M [[id number]]   carvedilol (COREG) 12.5 MG tablet    Sig: Take 1 tablet (12.5 mg total) by mouth 2 (two) times daily with a meal.    Dispense:  180 tablet    Refill:  3    Order Specific Question:   Supervising Provider    Answer:   [place], PETER M [[id number]]    Disposition:  FU with me in 4 months - he has recall later in the year with Dr. Tamala Julian as well.   Patient is agreeable to this plan and will call if any problems develop in the interim.   Amie Critchley, NP  01/25/2019 8:10 AM    [place]

## 2019-01-24 NOTE — Telephone Encounter (Signed)
Virtual Visit Pre-Appointment Phone Call  "(Name), I am calling you today to discuss your upcoming appointment. We are currently trying to limit exposure to the virus that causes COVID-19 by seeing patients at home rather than in the office."  1. "What is the BEST phone number to call the day of the visit?" - include this in appointment notes  2. "Do you have or have access to (through a family member/friend) a smartphone with video capability that we can use for your visit?" a. If yes - list this number in appt notes as "cell" (if different from BEST phone #) and list the appointment type as a VIDEO visit in appointment notes b. If no - list the appointment type as a PHONE visit in appointment notes  3. Confirm consent - "In the setting of the current Covid19 crisis, you are scheduled for a (phone or video) visit with your provider on (Wednesday, May 27) at (8:00 am).  Just as we do with many in-office visits, in order for you to participate in this visit, we must obtain consent.  If you'd like, I can send this to your mychart (if signed up) or email for you to review.  Otherwise, I can obtain your verbal consent now.  All virtual visits are billed to your insurance company just like a normal visit would be.  By agreeing to a virtual visit, we'd like you to understand that the technology does not allow for your provider to perform an examination, and thus may limit your provider's ability to fully assess your condition. If your provider identifies any concerns that need to be evaluated in person, we will make arrangements to do so.  Finally, though the technology is pretty good, we cannot assure that it will always work on either your or our end, and in the setting of a video visit, we may have to convert it to a phone-only visit.  In either situation, we cannot ensure that we have a secure connection.  Are you willing to proceed?" STAFF: Did the patient verbally acknowledge consent to telehealth  visit? Document YES/NO here: YES.   4. Advise patient to be prepared - "Two hours prior to your appointment, go ahead and check your blood pressure, pulse, oxygen saturation, and your weight (if you have the equipment to check those) and write them all down. When your visit starts, your provider will ask you for this information. If you have an Apple Watch or Kardia device, please plan to have heart rate information ready on the day of your appointment. Please have a pen and paper handy nearby the day of the visit as well."  5. Give patient instructions for MyChart download to smartphone OR Doximity/Doxy.me as below if video visit (depending on what platform provider is using)  6. Inform patient they will receive a phone call 15 minutes prior to their appointment time (may be from unknown caller ID) so they should be prepared to answer    TELEPHONE CALL NOTE  Jason Melendez has been deemed a candidate for a follow-up tele-health visit to limit community exposure during the Covid-19 pandemic. I spoke with the patient via phone to ensure availability of phone/video source, confirm preferred email & phone number, and discuss instructions and expectations.  I reminded Jason Melendez to be prepared with any vital sign and/or heart rhythm information that could potentially be obtained via home monitoring, at the time of his visit. I reminded Jason Melendez  to expect a phone call prior to his visit.   Leanord Asal 01/24/2019 8:44 AM   INSTRUCTIONS FOR DOWNLOADING THE MYCHART APP TO SMARTPHONE  - The patient must first make sure to have activated MyChart and know their login information - If Apple, go to App Store and type in MyChart in the search bar and download the app. If Android, ask patient to go to [employer] and type in [place] in the search bar and download the app. The app is free but as with any other app downloads, their phone may require them to verify  saved payment information or Apple/Android password.  - The patient will need to then log into the app with their MyChart username and password, and select [facility] as their healthcare provider to link the account. When it is time for your visit, go to the MyChart app, find appointments, and click Begin Video Visit. Be sure to Select Allow for your device to access the Microphone and Camera for your visit. You will then be connected, and your provider will be with you shortly.  **If they have any issues connecting, or need assistance please contact MyChart service desk [phone])**  **If using a computer, in order to ensure the best quality for their visit they will need to use either of the following Internet Browsers: [employer] or Google Chrome**  IF USING DOXIMITY or DOXY.ME - The patient will receive a link just prior to their visit by text.     FULL LENGTH CONSENT FOR TELE-HEALTH VISIT   I hereby voluntarily request, consent and authorize [facility] and its employed or contracted physicians, physician assistants, nurse practitioners or other licensed health care professionals (the Practitioner), to provide me with telemedicine health care services (the "Services") as deemed necessary by the treating Practitioner. I acknowledge and consent to receive the Services by the Practitioner via telemedicine. I understand that the telemedicine visit will involve communicating with the Practitioner through live audiovisual communication technology and the disclosure of certain medical information by electronic transmission. I acknowledge that I have been given the opportunity to request an in-person assessment or other available alternative prior to the telemedicine visit and am voluntarily participating in the telemedicine visit.  I understand that I have the right to withhold or withdraw my consent to the use of telemedicine in the course of my care at any time, without  affecting my right to future care or treatment, and that the Practitioner or I may terminate the telemedicine visit at any time. I understand that I have the right to inspect all information obtained and/or recorded in the course of the telemedicine visit and may receive copies of available information for a reasonable fee.  I understand that some of the potential risks of receiving the Services via telemedicine include:  Marland Kitchen Delay or interruption in medical evaluation due to technological equipment failure or disruption; . Information transmitted may not be sufficient (e.g. poor resolution of images) to allow for appropriate medical decision making by the Practitioner; and/or  . In rare instances, security protocols could fail, causing a breach of personal health information.  Furthermore, I acknowledge that it is my responsibility to provide information about my medical history, conditions and care that is complete and accurate to the best of my ability. I acknowledge that Practitioner's advice, recommendations, and/or decision may be based on factors not within their control, such as incomplete or inaccurate data provided by me or distortions of diagnostic images or specimens  that may result from electronic transmissions. I understand that the practice of medicine is not an exact science and that Practitioner makes no warranties or guarantees regarding treatment outcomes. I acknowledge that I will receive a copy of this consent concurrently upon execution via email to the email address I last provided but may also request a printed copy by calling the office of [facility].    I understand that my insurance will be billed for this visit.   I have read or had this consent read to me. . I understand the contents of this consent, which adequately explains the benefits and risks of the Services being provided via telemedicine.  . I have been provided ample opportunity to ask questions regarding this  consent and the Services and have had my questions answered to my satisfaction. . I give my informed consent for the services to be provided through the use of telemedicine in my medical care  By participating in this telemedicine visit I agree to the above.

## 2019-01-25 ENCOUNTER — Encounter: Payer: Self-pay | Admitting: Nurse Practitioner

## 2019-01-25 ENCOUNTER — Telehealth (INDEPENDENT_AMBULATORY_CARE_PROVIDER_SITE_OTHER): Payer: BLUE CROSS/BLUE SHIELD | Admitting: Nurse Practitioner

## 2019-01-25 ENCOUNTER — Other Ambulatory Visit: Payer: Self-pay

## 2019-01-25 VITALS — BP 116/67 | HR 72 | Ht 73.0 in | Wt 258.5 lb

## 2019-01-25 DIAGNOSIS — I5042 Chronic combined systolic (congestive) and diastolic (congestive) heart failure: Secondary | ICD-10-CM

## 2019-01-25 DIAGNOSIS — E7849 Other hyperlipidemia: Secondary | ICD-10-CM

## 2019-01-25 DIAGNOSIS — Z7189 Other specified counseling: Secondary | ICD-10-CM

## 2019-01-25 DIAGNOSIS — I429 Cardiomyopathy, unspecified: Secondary | ICD-10-CM

## 2019-01-25 DIAGNOSIS — I119 Hypertensive heart disease without heart failure: Secondary | ICD-10-CM

## 2019-01-25 DIAGNOSIS — I10 Essential (primary) hypertension: Secondary | ICD-10-CM

## 2019-01-25 MED ORDER — SPIRONOLACTONE 25 MG PO TABS: 25.0000 mg | ORAL_TABLET | Freq: Every day | ORAL | 3 refills | Status: DC

## 2019-01-25 MED ORDER — CARVEDILOL 12.5 MG PO TABS: 12.5000 mg | ORAL_TABLET | Freq: Two times a day (BID) | ORAL | 3 refills | Status: DC

## 2019-01-25 MED ORDER — SACUBITRIL-VALSARTAN 49-51 MG PO TABS: 1.0000 | ORAL_TABLET | Freq: Two times a day (BID) | ORAL | 3 refills | Status: DC

## 2019-01-25 NOTE — Patient Instructions (Addendum)
After Visit Summary:  We will be checking the following labs today - NONE  Fasting labs on day of your echocardiogram   Medication Instructions:    Continue with your current medicines.   I have sent in your refills today   If you need a refill on your cardiac medications before your next appointment, please call your pharmacy.     Testing/Procedures To Be Arranged:  Echocardiogram - in about a month or so - preferably on Monday and early with fasting labs  Follow-Up:   See me in about 3 to 4 months     At [facility], you and your health needs are our priority.  As part of our continuing mission to provide you with exceptional heart care, we have created designated Provider Care Teams.  These Care Teams include your primary Cardiologist (physician) and Advanced Practice Providers (APPs -  Physician Assistants and Nurse Practitioners) who all work together to provide you with the care you need, when you need it.  Special Instructions:  . Stay safe, stay home, wash your hands for at least 20 seconds and wear a mask when out in public.  . It was good to talk with you today.  Marland Kitchen Keep up the good work.    Call the [facility] office at [phone] if you have any questions, problems or concerns.

## 2019-02-02 ENCOUNTER — Other Ambulatory Visit: Payer: Self-pay | Admitting: Primary Care

## 2019-02-02 DIAGNOSIS — E7849 Other hyperlipidemia: Secondary | ICD-10-CM

## 2019-02-03 NOTE — Telephone Encounter (Signed)
Last prescribed on 08/12/2018. Last appointment on 06/16/2018. No future appointment

## 2019-02-03 NOTE — Telephone Encounter (Signed)
Noted. Sent patient message as he's due for follow up. Refill sent to pharmacy.

## 2019-02-24 ENCOUNTER — Telehealth (HOSPITAL_COMMUNITY): Payer: Self-pay | Admitting: *Deleted

## 2019-02-24 NOTE — Telephone Encounter (Signed)
COVID-19 Pre-Screening Questions:  . Do you currently have a fever? NO (yes = cancel and refer to pcp for e-visit) . Have you recently travelled on a cruise, internationally, or to [place], [place], [place], [place], [place], or [place], [place] ([place]) ? NO (yes = cancel, stay home, monitor symptoms, and contact pcp or initiate e-visit if symptoms develop) . Have you been in contact with someone that is currently pending confirmation of Covid19 testing or has been confirmed to have the Covid19 virus?  NO (yes = cancel, stay home, away from tested individual, monitor symptoms, and contact pcp or initiate e-visit if symptoms develop) . Are you currently experiencing fatigue or cough? NO (yes = pt should be prepared to have a mask placed at the time of their visit).   . Reiterated no additional visitors. . Arrive no earlier than 15 minutes before appointment time. . Please bring own mask.  Jason Melendez 

## 2019-02-25 ENCOUNTER — Other Ambulatory Visit: Payer: Self-pay | Admitting: Primary Care

## 2019-02-25 DIAGNOSIS — E119 Type 2 diabetes mellitus without complications: Secondary | ICD-10-CM

## 2019-02-27 ENCOUNTER — Ambulatory Visit (HOSPITAL_COMMUNITY): Payer: BC Managed Care – PPO | Attending: Cardiology

## 2019-02-27 ENCOUNTER — Other Ambulatory Visit: Payer: Self-pay

## 2019-02-27 ENCOUNTER — Other Ambulatory Visit: Payer: BC Managed Care – PPO

## 2019-02-27 DIAGNOSIS — I5042 Chronic combined systolic (congestive) and diastolic (congestive) heart failure: Secondary | ICD-10-CM | POA: Diagnosis not present

## 2019-02-27 DIAGNOSIS — E7849 Other hyperlipidemia: Secondary | ICD-10-CM

## 2019-02-27 DIAGNOSIS — I10 Essential (primary) hypertension: Secondary | ICD-10-CM

## 2019-02-27 DIAGNOSIS — I429 Cardiomyopathy, unspecified: Secondary | ICD-10-CM | POA: Diagnosis not present

## 2019-02-27 LAB — HEPATIC FUNCTION PANEL
ALT: 9 IU/L (ref 0–44)
AST: 16 IU/L (ref 0–40)
Albumin: 4.2 g/dL (ref 3.8–4.8)
Alkaline Phosphatase: 36 IU/L — ABNORMAL LOW (ref 39–117)
Bilirubin Total: 0.6 mg/dL (ref 0.0–1.2)
Bilirubin, Direct: 0.14 mg/dL (ref 0.00–0.40)
Total Protein: 6.4 g/dL (ref 6.0–8.5)

## 2019-02-27 LAB — BASIC METABOLIC PANEL
BUN/Creatinine Ratio: 17 (ref 10–24)
BUN: 18 mg/dL (ref 8–27)
CO2: 21 mmol/L (ref 20–29)
Calcium: 9.1 mg/dL (ref 8.6–10.2)
Chloride: 103 mmol/L (ref 96–106)
Creatinine, Ser: 1.09 mg/dL (ref 0.76–1.27)
GFR calc Af Amer: 84 mL/min/{1.73_m2} (ref >59)
GFR calc non Af Amer: 72 mL/min/{1.73_m2} (ref >59)
Glucose: 149 mg/dL — ABNORMAL HIGH (ref 65–99)
Potassium: 4.9 mmol/L (ref 3.5–5.2)
Sodium: 141 mmol/L (ref 134–144)

## 2019-02-27 LAB — CBC
Hematocrit: 40.1 % (ref 37.5–51.0)
Hemoglobin: 12.8 g/dL — ABNORMAL LOW (ref 13.0–17.7)
MCH: 30.5 pg (ref 26.6–33.0)
MCHC: 31.9 g/dL (ref 31.5–35.7)
MCV: 96 fL (ref 79–97)
Platelets: 160 10*3/uL (ref 150–450)
RBC: 4.19 x10E6/uL (ref 4.14–5.80)
RDW: 12.1 % (ref 11.6–15.4)
WBC: 4.7 10*3/uL (ref 3.4–10.8)

## 2019-02-27 LAB — LIPID PANEL
Chol/HDL Ratio: 4.2 ratio (ref 0.0–5.0)
Cholesterol, Total: 109 mg/dL (ref 100–199)
HDL: 26 mg/dL — ABNORMAL LOW (ref >39)
LDL Calculated: 54 mg/dL (ref 0–99)
Triglycerides: 144 mg/dL (ref 0–149)
VLDL Cholesterol Cal: 29 mg/dL (ref 5–40)

## 2019-03-01 ENCOUNTER — Other Ambulatory Visit: Payer: Self-pay | Admitting: *Deleted

## 2019-03-01 DIAGNOSIS — I7789 Other specified disorders of arteries and arterioles: Secondary | ICD-10-CM

## 2019-03-20 ENCOUNTER — Other Ambulatory Visit: Payer: Self-pay | Admitting: Primary Care

## 2019-03-20 DIAGNOSIS — E119 Type 2 diabetes mellitus without complications: Secondary | ICD-10-CM

## 2019-04-07 ENCOUNTER — Ambulatory Visit (INDEPENDENT_AMBULATORY_CARE_PROVIDER_SITE_OTHER)
Admission: RE | Admit: 2019-04-07 | Discharge: 2019-04-07 | Disposition: A | Discharge status: Home / Self Care | Payer: BC Managed Care – PPO | Source: Ambulatory Visit | Attending: Nurse Practitioner | Admitting: Nurse Practitioner

## 2019-04-07 ENCOUNTER — Other Ambulatory Visit: Payer: Self-pay

## 2019-04-07 DIAGNOSIS — I7789 Other specified disorders of arteries and arterioles: Secondary | ICD-10-CM | POA: Diagnosis not present

## 2019-04-07 DIAGNOSIS — Z0389 Encounter for observation for other suspected diseases and conditions ruled out: Secondary | ICD-10-CM | POA: Diagnosis not present

## 2019-04-07 HISTORY — DX: Type 2 diabetes mellitus without complications: E11.9

## 2019-04-07 MED ORDER — IOHEXOL 350 MG/ML SOLN
100.0000 mL | Freq: Once | INTRAVENOUS | Status: AC | PRN
  Administered 2019-04-07: 100 mL via INTRAVENOUS

## 2019-04-10 DIAGNOSIS — E119 Type 2 diabetes mellitus without complications: Secondary | ICD-10-CM

## 2019-04-10 MED ORDER — METFORMIN HCL 500 MG PO TABS: ORAL_TABLET | ORAL | 0 refills | Status: DC

## 2019-04-21 ENCOUNTER — Encounter: Payer: BC Managed Care – PPO | Admitting: Primary Care

## 2019-04-22 ENCOUNTER — Other Ambulatory Visit: Payer: Self-pay | Admitting: Primary Care

## 2019-04-22 NOTE — Progress Notes (Signed)
CARDIOLOGY OFFICE NOTE  Date:  04/24/2019    Jason Melendez Date of Birth: 1956/01/01 Medical Record #[id number]  PCP:  Pleas Koch, NP  Cardiologist:  Jennings Books    Chief Complaint  Patient presents with  . Follow-up    Seen for Dr. Tamala Julian    History of Present Illness: Jason Melendez is a 63 y.o. male who presents today for a 3 month check. Seen for Dr. Tamala Julian.   He has had prior left brain stroke,high-grade left carotid stenosis treated with surgery by Dr. 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 He has not had cardiac cath.   Admitted at the end of July 2019 with recurrent syncopewhile working at [employer]- felt to be vasovagal in the setting of volume depletion and exertion. Lasix changed to prn. Aldactone was held. Coreg increased given frequency of PVCs noted.He remained on Hyzaar. I then saw him for his post hospital visit for the episode of syncope.  He had previously been taking Lasix along with his HCTZ in the Hyzaar about 3 times a week due to swelling. He felt like he had been hydrated on the day of the syncopal spell. He was now on higher dose of Coreg. BP was soft. I talked with Dr. Tamala Julian - we added back low dose Lasix prn, started Entresto and stopped the Hyzaar.    Last seen by Dr. Tamala Julian back in November. He was doing well. Using very little prn Lasix. I saw him for a telehealth visit back in late May - he was doing well. Some family medical stress. No recurrent syncope. Using Lasix 3 times a week. We got his echo updated. EF had mildly improved - aorta was dilated on echo but not on CT.   The patient does not have symptoms concerning for COVID-19 infection  (fever, chills, cough, or new shortness of breath).   Comes in today. Here alone .For his physical later this week. Labs from June noted. Needs Aldactone refilled today. We have reviewed his most recent echo and the subsequent CT scan. He is doing ok. Feels good. No chest pain. Not dizzy or lightheaded - only if "I really push it" and when out in the heat. No palpitations. No recurrent syncope. Tolerating his medicines. No real complaint and overall feels like he is doing ok.   Past Medical History:  Diagnosis Date  . Diabetes mellitus without complication ([place])   . HTN (hypertension)   . Hyperlipidemia     Past Surgical History:  Procedure Laterality Date  . ENDARTERECTOMY Left 10/16/2016   Procedure: ENDARTERECTOMY CAROTID;  Surgeon: Serafina Mitchell, MD;  Location: [place];  Service: Vascular;  Laterality: Left;  . HERNIA REPAIR    . PATCH ANGIOPLASTY Left 10/16/2016   Procedure: PATCH ANGIOPLASTY USING Rueben Bash BIOLOGIC PATCH;  Surgeon: Serafina Mitchell, MD;  Location: [facility];  Service: Vascular;  Laterality: Left;     Medications: Current Meds  Medication Sig  . ACCU-CHEK FASTCLIX LANCETS MISC USE TO CHECK BLOOD SUGARS ONCE DAILY AS DIRECTED  . acetaminophen (TYLENOL 8 HOUR ARTHRITIS PAIN) 650 MG CR tablet Take 650 mg by mouth 2 (two) times daily.  Marland Kitchen aspirin 325 MG tablet Take 1 tablet (325 mg total) by mouth daily.  Marland Kitchen  atorvastatin (LIPITOR) 40 MG tablet Take 1 tablet (40 mg total) by mouth every evening. For cholesterol.  . Blood Glucose Monitoring Suppl (ACCU-CHEK GUIDE ME) w/Device KIT USE TO CHECK BLOOD SUGAR ONCE DAILY AS DIRECTED  . carvedilol (COREG) 12.5 MG tablet Take 1 tablet (12.5 mg total) by mouth 2 (two) times daily with a meal.  . escitalopram (LEXAPRO) 10 MG tablet TAKE 1 TABLET BY MOUTH EVERY DAY  . furosemide (LASIX) 20 MG tablet Take 20 mg by mouth 3 (three) times a week. Mon, Wed, and Fri  . glucose blood (ACCU-CHEK GUIDE) test strip Accu-Chek Guide Use as instructed  to test blood sugar up to 2 times daily.  . metFORMIN (GLUCOPHAGE) 500 MG tablet TAKE 1 TABLET BY MOUTH 2 TIMES DAILY WITH A MEAL.  . sacubitril-valsartan (ENTRESTO) 49-51 MG Take 1 tablet by mouth 2 (two) times daily.  Marland Kitchen spironolactone (ALDACTONE) 25 MG tablet Take 1 tablet (25 mg total) by mouth daily.  . [DISCONTINUED] spironolactone (ALDACTONE) 25 MG tablet Take 1 tablet (25 mg total) by mouth daily.     Allergies: Allergies  Allergen Reactions  . Hydralazine Anaphylaxis  . Hydralazine Hcl     Sudden drop in BP    Social History: The patient  reports that he has never smoked. He has never used smokeless tobacco. He reports that he does not drink alcohol or use drugs.   Family History: The patient's family history includes Diabetes in his father and mother; Heart disease (age of onset: 47) in his father; Hypertension in his sister; Kidney disease in his father; Kidney failure in his father.   Review of Systems: Please see the history of present illness.   All other systems are reviewed and negative.   Physical Exam: VS:  BP 126/66 (BP Location: Left Arm, Patient Position: Sitting, Cuff Size: Large)   Pulse 82   Ht '6\' 1"'$  (1.854 m)   Wt 271 lb (122.9 kg)   BMI 35.75 kg/m  .  BMI Body mass index is 35.75 kg/m.  Wt Readings from Last 3 Encounters:  04/24/19 271 lb (122.9 kg)  01/25/19 258 lb 8 oz (117.3 kg)  07/11/18 269 lb 3.2 oz (122.1 kg)    General: Pleasant. Well developed, well nourished and in no acute distress.   HEENT: Normal.  Neck: Supple, no JVD, carotid bruits, or masses noted.  Cardiac: Regular rate and rhythm. No murmurs, rubs, or gallops. No edema.  Respiratory:  Lungs are clear to auscultation bilaterally with normal work of breathing.  GI: Soft and nontender.  MS: No deformity or atrophy. Gait and ROM intact.  Skin: Warm and dry. Color is normal.  Neuro:  Strength and sensation are intact and no gross focal deficits noted.  Psych: Alert, appropriate  and with normal affect.   LABORATORY DATA:  EKG:  EKG is ordered today. This demonstrates NSR with PVCs - HR is 82.  Lab Results  Component Value Date   WBC 4.7 02/27/2019   HGB 12.8 (L) 02/27/2019   HCT 40.1 02/27/2019   PLT 160 02/27/2019   GLUCOSE 149 (H) 02/27/2019   CHOL 109 02/27/2019   TRIG 144 02/27/2019   HDL 26 (L) 02/27/2019   LDLDIRECT 52.0 03/04/2018   LDLCALC 54 02/27/2019   ALT 9 02/27/2019   AST 16 02/27/2019   NA 141 02/27/2019   K 4.9 02/27/2019   CL 103 02/27/2019   CREATININE 1.09 02/27/2019   BUN 18 02/27/2019   CO2 21 02/27/2019  TSH 1.460 07/21/2015   PSA 1.16 01/22/2017   INR 1.06 10/17/2016   HGBA1C 6.0 (A) 06/16/2018     BNP (last 3 results) No results for input(s): BNP in the last 8760 hours.  ProBNP (last 3 results) No results for input(s): PROBNP in the last 8760 hours.   Other Studies Reviewed Today:  CTA CHEST IMPRESSION 04/2019: 1. No evidence of thoracic aortic aneurysm or dissection. 2.  Aortic Atherosclerosis (ICD10-I70.0). 3. Suspected hepatic steatosis.  Correlation with LFTs is advised.  Electronically Signed   By: Sandi Mariscal M.D.   On: 04/07/2019 16:30  ECHO IMPRESSIONS 01/2019  1. The left ventricle has mild-moderately reduced systolic function, with an ejection fraction of 40-45%. The cavity size was mildly dilated. Left ventricular diastolic parameters were normal. Left ventricular diffuse hypokinesis.  2. The right ventricle has normal systolic function. The cavity was normal.  3. The mitral valve is grossly normal.  4. The tricuspid valve is grossly normal.  5. The aortic valve is tricuspid. No stenosis of the aortic valve.  6. There is mild dilatation of the ascending aorta measuring 39 mm.  7. Mild to moderate global reduction in LV systolic function; mild LVE; mildly dilated ascending aorta.    MYOVIEWIMPRESSION2/2018: 1. Mild attenuation of the left ventricular apex without evidence of inducible  myocardial ischemia.  2. Global left ventricular hypokinesis with LV cavity dilatation.  3. Left ventricular ejection fraction 34%  4. Non invasive risk stratification*: Intermediate  *2012 Appropriate Use Criteria for Coronary Revascularization Focused Update: J Am Coll Cardiol. 7824;23(5):361-443. http://content.airportbarriers.com.aspx?articleid=1201161   Electronically Signed By: Aletta Edouard M.D. On: 10/15/2016 15:01    ASSESSMENT & PLAN:    1. History of recurrent syncope - he has had what has been defined as vasovagal syncope in the past -he is doing well clinically without recurrence. No changes made today.   2. Chronic combined systolic &diastolic HF - presumed related to HTN. EF is 40 to 45% by his most recent echo - on Coreg, Aldactone and Entresto  3. HTN - BP is ok - no changes made today.   4. DM - per PCP. Seeing later this week.   5. Prior stroke with carotid endarterectomy - he remains on high dose aspirin per [facility]. Not discussed today.   6. HLD - remains on statin therapy - labs from earlier this summer noted - LDL is 59 - no changes made today.   7. Fatty liver - needs diet/exercise/weight loss.   8. COVID-19 Education: The signs and symptoms of COVID-19 were discussed with the patient and how to seek care for testing (follow up with PCP or arrange E-visit).  The importance of social distancing, staying at home, hand hygiene and wearing a mask when out in public were discussed today.  Current medicines are reviewed with the patient today.  The patient does not have concerns regarding medicines other than what has been noted above.  The following changes have been made:  See above.  Labs/ tests ordered today include:    Orders Placed This Encounter  Procedures  . EKG 12-Lead     Disposition:   FU with Dr. Tamala Julian in 6 months. I am happy to see back as needed.    Patient is agreeable to this plan and will call if any  problems develop in the interim.   SignedTruitt Merle, NP  04/24/2019 8:30 AM  [facility] [address] [place], [place]  [address] Phone: [phone] Fax: [phone]

## 2019-04-24 ENCOUNTER — Other Ambulatory Visit: Payer: Self-pay

## 2019-04-24 ENCOUNTER — Ambulatory Visit: Payer: BC Managed Care – PPO | Admitting: Nurse Practitioner

## 2019-04-24 ENCOUNTER — Encounter: Payer: Self-pay | Admitting: Nurse Practitioner

## 2019-04-24 VITALS — BP 126/66 | HR 82 | Ht 73.0 in | Wt 271.0 lb

## 2019-04-24 DIAGNOSIS — I5022 Chronic systolic (congestive) heart failure: Secondary | ICD-10-CM

## 2019-04-24 DIAGNOSIS — E7849 Other hyperlipidemia: Secondary | ICD-10-CM

## 2019-04-24 DIAGNOSIS — I6300 Cerebral infarction due to thrombosis of unspecified precerebral artery: Secondary | ICD-10-CM

## 2019-04-24 DIAGNOSIS — Z7189 Other specified counseling: Secondary | ICD-10-CM

## 2019-04-24 DIAGNOSIS — I119 Hypertensive heart disease without heart failure: Secondary | ICD-10-CM

## 2019-04-24 DIAGNOSIS — I5042 Chronic combined systolic (congestive) and diastolic (congestive) heart failure: Secondary | ICD-10-CM

## 2019-04-24 DIAGNOSIS — R7303 Prediabetes: Secondary | ICD-10-CM

## 2019-04-24 DIAGNOSIS — I10 Essential (primary) hypertension: Secondary | ICD-10-CM

## 2019-04-24 DIAGNOSIS — I429 Cardiomyopathy, unspecified: Secondary | ICD-10-CM

## 2019-04-24 MED ORDER — SPIRONOLACTONE 25 MG PO TABS: 25.0000 mg | ORAL_TABLET | Freq: Every day | ORAL | 3 refills | Status: DC

## 2019-04-24 NOTE — Patient Instructions (Addendum)
After Visit Summary:  We will be checking the following labs today - NONE   Medication Instructions:    Continue with your current medicines.    If you need a refill on your cardiac medications before your next appointment, please call your pharmacy.     Testing/Procedures To Be Arranged:  N/A  Follow-Up:   See Dr. Smith in 6 months.     At [facility], you and your health needs are our priority.  As part of our continuing mission to provide you with exceptional heart care, we have created designated Provider Care Teams.  These Care Teams include your primary Cardiologist (physician) and Advanced Practice Providers (APPs -  Physician Assistants and Nurse Practitioners) who all work together to provide you with the care you need, when you need it.  Special Instructions:  . Stay safe, stay home, wash your hands for at least 20 seconds and wear a mask when out in public.  . It was good to talk with you today.    Call the [facility] office at [phone] if you have any questions, problems or concerns.       

## 2019-04-26 ENCOUNTER — Other Ambulatory Visit: Payer: Self-pay

## 2019-04-26 ENCOUNTER — Encounter: Payer: Self-pay | Admitting: Primary Care

## 2019-04-26 ENCOUNTER — Ambulatory Visit (INDEPENDENT_AMBULATORY_CARE_PROVIDER_SITE_OTHER): Payer: BC Managed Care – PPO | Admitting: Primary Care

## 2019-04-26 VITALS — BP 126/70 | HR 76 | Temp 98.7°F | Ht 73.0 in | Wt 271.2 lb

## 2019-04-26 DIAGNOSIS — F411 Generalized anxiety disorder: Secondary | ICD-10-CM

## 2019-04-26 DIAGNOSIS — I6522 Occlusion and stenosis of left carotid artery: Secondary | ICD-10-CM

## 2019-04-26 DIAGNOSIS — I5042 Chronic combined systolic (congestive) and diastolic (congestive) heart failure: Secondary | ICD-10-CM

## 2019-04-26 DIAGNOSIS — E119 Type 2 diabetes mellitus without complications: Secondary | ICD-10-CM | POA: Diagnosis not present

## 2019-04-26 DIAGNOSIS — I6300 Cerebral infarction due to thrombosis of unspecified precerebral artery: Secondary | ICD-10-CM

## 2019-04-26 DIAGNOSIS — I10 Essential (primary) hypertension: Secondary | ICD-10-CM

## 2019-04-26 DIAGNOSIS — Z0000 Encounter for general adult medical examination without abnormal findings: Secondary | ICD-10-CM | POA: Diagnosis not present

## 2019-04-26 DIAGNOSIS — E7849 Other hyperlipidemia: Secondary | ICD-10-CM

## 2019-04-26 LAB — POCT GLYCOSYLATED HEMOGLOBIN (HGB A1C): Hemoglobin A1C: 6.3 % — AB (ref 4.0–5.6)

## 2019-04-26 MED ORDER — METFORMIN HCL 500 MG PO TABS: ORAL_TABLET | ORAL | 3 refills | Status: DC

## 2019-04-26 MED ORDER — ESCITALOPRAM OXALATE 10 MG PO TABS: 10.0000 mg | ORAL_TABLET | Freq: Every day | ORAL | 3 refills | Status: DC

## 2019-04-26 MED ORDER — ATORVASTATIN CALCIUM 40 MG PO TABS: 40.0000 mg | ORAL_TABLET | Freq: Every evening | ORAL | 3 refills | Status: DC

## 2019-04-26 NOTE — Assessment & Plan Note (Signed)
Recent LDL at goal. Continue atorvastatin 40 mg.

## 2019-04-26 NOTE — Assessment & Plan Note (Signed)
Immunizations UTD. Will get flu shot at work. PSA UTD. Colon cancer screening UTD, due in 2022. Discussed the importance of a healthy diet and regular exercise in order for weight loss, and to reduce the risk of any potential medical problems. Exam unremarkable. Labs reviewed.

## 2019-04-26 NOTE — Assessment & Plan Note (Signed)
Stable in the office today, continue current regimen. BMP from June 2020 reviewed.

## 2019-04-26 NOTE — Progress Notes (Signed)
Subjective:    Patient ID: Jason Melendez, male    DOB: Feb 03, 1956, 63 y.o.   MRN: [id number]  HPI  [name] is a 63 year old male who presents today for complete physical.  Immunizations: -Tetanus: Completed in 2016 -Influenza: Due this season, will get at work -Pneumonia: Completed in 2019  Diet: He endorses a healthy diet. Mostly home cooked meals. Some take out on the weekends.  Exercise: He is active at work, no regular exercise.   Eye exam: Completed in 2020 Dental exam: No recent exam Colonoscopy: Completed Cologuard in 2019, negative. Due in 2022. PSA: Completed in 2018 Hep C Screen: Negative   BP Readings from Last 3 Encounters:  04/26/19 126/70  04/24/19 126/66  01/25/19 116/67     Review of Systems  Constitutional: Negative for unexpected weight change.  HENT: Negative for rhinorrhea.   Respiratory: Negative for cough and shortness of breath.   Cardiovascular: Negative for chest pain.  Gastrointestinal: Negative for constipation and diarrhea.  Genitourinary: Negative for difficulty urinating.  Musculoskeletal: Negative for arthralgias and myalgias.  Skin: Negative for rash.  Allergic/Immunologic: Negative for environmental allergies.  Neurological: Negative for dizziness, numbness and headaches.  Psychiatric/Behavioral:       Overall feels well managed on Lexapro       Past Medical History:  Diagnosis Date  . Diabetes mellitus without complication ([place])   . HTN (hypertension)   . Hyperlipidemia      Social History   Socioeconomic History  . Marital status: Married    Spouse name: Not on file  . Number of children: Not on file  . Years of education: Not on file  . Highest education level: Not on file  Occupational History  . Not on file  Social Needs  . Financial resource strain: Not on file  . Food insecurity    Worry: Not on file    Inability: Not on file  . Transportation needs    Medical: Not on file    Non-medical:  Not on file  Tobacco Use  . Smoking status: Never Smoker  . Smokeless tobacco: Never Used  Substance and Sexual Activity  . Alcohol use: No    Alcohol/week: 0.0 standard drinks  . Drug use: No  . Sexual activity: Not Currently  Lifestyle  . Physical activity    Days per week: Not on file    Minutes per session: Not on file  . Stress: Not on file  Relationships  . Social Herbalist on phone: Not on file    Gets together: Not on file    Attends religious service: Not on file    Active member of club or organization: Not on file    Attends meetings of clubs or organizations: Not on file    Relationship status: Not on file  . Intimate partner violence    Fear of current or ex partner: Not on file    Emotionally abused: Not on file    Physically abused: Not on file    Forced sexual activity: Not on file  Other Topics Concern  . Not on file  Social History Narrative   Marital status: married      Children: 2 daughters      Lives:  With wife      Employment: printing work      Tobacco: none      Alcohol: one beer every three months      Drugs:  None      Exercise:  No formal exercise; walks up three flights of stairs several times per day at work.   Lives at home.  Works for [employer].     2 yrs college.      Past Surgical History:  Procedure Laterality Date  . ENDARTERECTOMY Left 10/16/2016   Procedure: ENDARTERECTOMY CAROTID;  Surgeon: Serafina Mitchell, MD;  Location: [facility];  Service: Vascular;  Laterality: Left;  . HERNIA REPAIR    . PATCH ANGIOPLASTY Left 10/16/2016   Procedure: PATCH ANGIOPLASTY USING Rueben Bash BIOLOGIC PATCH;  Surgeon: Serafina Mitchell, MD;  [facility];  Service: Vascular;  Laterality: Left;    Family History  Problem Relation Age of Onset  . Diabetes Mother   . Heart disease Father 47       CABG  . Diabetes Father   . Kidney disease Father   . Kidney failure Father   . Hypertension Sister     Allergies  Allergen  Reactions  . Hydralazine Anaphylaxis  . Hydralazine Hcl     Sudden drop in BP    Current Outpatient Medications on File Prior to Visit  Medication Sig Dispense Refill  . ACCU-CHEK FASTCLIX LANCETS MISC USE TO CHECK BLOOD SUGARS ONCE DAILY AS DIRECTED 102 each 5  . acetaminophen (TYLENOL 8 HOUR ARTHRITIS PAIN) 650 MG CR tablet Take 650 mg by mouth 2 (two) times daily.    Marland Kitchen aspirin 325 MG tablet Take 1 tablet (325 mg total) by mouth daily. 60 tablet 0  . atorvastatin (LIPITOR) 40 MG tablet Take 1 tablet (40 mg total) by mouth every evening. For cholesterol. 90 tablet 0  . Blood Glucose Monitoring Suppl (ACCU-CHEK GUIDE ME) w/Device KIT USE TO CHECK BLOOD SUGAR ONCE DAILY AS DIRECTED 1 kit 0  . carvedilol (COREG) 12.5 MG tablet Take 1 tablet (12.5 mg total) by mouth 2 (two) times daily with a meal. 180 tablet 3  . escitalopram (LEXAPRO) 10 MG tablet TAKE 1 TABLET BY MOUTH EVERY DAY 30 tablet 0  . furosemide (LASIX) 20 MG tablet Take 20 mg by mouth 3 (three) times a week. Mon, Wed, and Fri    . glucose blood (ACCU-CHEK GUIDE) test strip Accu-Chek Guide Use as instructed to test blood sugar up to 2 times daily. 100 each 1  . metFORMIN (GLUCOPHAGE) 500 MG tablet TAKE 1 TABLET BY MOUTH 2 TIMES DAILY WITH A MEAL. 60 tablet 0  . sacubitril-valsartan (ENTRESTO) 49-51 MG Take 1 tablet by mouth 2 (two) times daily. 180 tablet 3  . spironolactone (ALDACTONE) 25 MG tablet Take 1 tablet (25 mg total) by mouth daily. 90 tablet 3   No current facility-administered medications on file prior to visit.     BP 126/70   Pulse 76   Temp 98.7 F (37.1 C) (Temporal)   Ht '6\' 1"'$  (1.854 m)   Wt 271 lb 4 oz (123 kg)   SpO2 98%   BMI 35.79 kg/m    Objective:   Physical Exam  Constitutional: He is oriented to person, place, and time. He appears well-nourished.  HENT:  Right Ear: Tympanic membrane and ear canal normal.  Left Ear: Tympanic membrane and ear canal normal.  Mouth/Throat: Oropharynx is clear and  moist.  Eyes: Pupils are equal, round, and reactive to light. EOM are normal.  Neck: Neck supple.  Cardiovascular: Normal rate and regular rhythm.  Respiratory: Effort normal and breath sounds normal.  GI: Soft. Bowel sounds are  normal. There is no abdominal tenderness.  Musculoskeletal: Normal range of motion.  Neurological: He is alert and oriented to person, place, and time.  Skin: Skin is warm and dry.  Psychiatric: He has a normal mood and affect.           Assessment & Plan:

## 2019-04-26 NOTE — Assessment & Plan Note (Signed)
S/p left endarterectomy in 2018. Compliant to statin and aspirin. Recent LDL at goal.

## 2019-04-26 NOTE — Assessment & Plan Note (Signed)
Overall doing well on Lexapro 10 mg, continue same.  Denies SI/HI.

## 2019-04-26 NOTE — Assessment & Plan Note (Signed)
A1C of 6.3 today which is under great control. Encouraged regular exercise, healthy diet.  Foot exam today. Pneumonia vaccination UTD. Managed on ARB and statin. Eye exam UTD.  Follow up in 6 months. Continue Metformin.

## 2019-04-26 NOTE — Assessment & Plan Note (Signed)
LDL under great control. Compliant to statin therapy, continue same.

## 2019-04-26 NOTE — Assessment & Plan Note (Signed)
Compliant to prescribed regimen. Continue beta blocker, diuretics, BP control. Following with cardiology.

## 2019-04-26 NOTE — Patient Instructions (Signed)
Start exercising. You should be getting 150 minutes of exercise weekly.  It's important to improve your diet by reducing consumption of fast food, fried food, processed snack foods, sugary drinks. Increase consumption of fresh vegetables and fruits, whole grains, water.  Ensure you are drinking 64 ounces of water daily.  Please schedule a follow up appointment in 6 months for diabetes check.  It was a pleasure to see you today!     Preventive Care 34-53 Years Old, Male Preventive care refers to lifestyle choices and visits with your health care provider that can promote health and wellness. This includes:  A yearly physical exam. This is also called an annual well check.  Regular dental and eye exams.  Immunizations.  Screening for certain conditions.  Healthy lifestyle choices, such as eating a healthy diet, getting regular exercise, not using drugs or products that contain nicotine and tobacco, and limiting alcohol use. What can I expect for my preventive care visit? Physical exam Your health care provider will check:  Height and weight. These may be used to calculate body mass index (BMI), which is a measurement that tells if you are at a healthy weight.  Heart rate and blood pressure.  Your skin for abnormal spots. Counseling Your health care provider may ask you questions about:  Alcohol, tobacco, and drug use.  Emotional well-being.  Home and relationship well-being.  Sexual activity.  Eating habits.  Work and work Statistician. What immunizations do I need?  Influenza (flu) vaccine  This is recommended every year. Tetanus, diphtheria, and pertussis (Tdap) vaccine  You may need a Td booster every 10 years. Varicella (chickenpox) vaccine  You may need this vaccine if you have not already been vaccinated. Zoster (shingles) vaccine  You may need this after age 5. Measles, mumps, and rubella (MMR) vaccine  You may need at least one dose of MMR if you  were born in 1957 or later. You may also need a second dose. Pneumococcal conjugate (PCV13) vaccine  You may need this if you have certain conditions and were not previously vaccinated. Pneumococcal polysaccharide (PPSV23) vaccine  You may need one or two doses if you smoke cigarettes or if you have certain conditions. Meningococcal conjugate (MenACWY) vaccine  You may need this if you have certain conditions. Hepatitis A vaccine  You may need this if you have certain conditions or if you travel or work in places where you may be exposed to hepatitis A. Hepatitis B vaccine  You may need this if you have certain conditions or if you travel or work in places where you may be exposed to hepatitis B. Haemophilus influenzae type b (Hib) vaccine  You may need this if you have certain risk factors. Human papillomavirus (HPV) vaccine  If recommended by your health care provider, you may need three doses over 6 months. You may receive vaccines as individual doses or as more than one vaccine together in one shot (combination vaccines). Talk with your health care provider about the risks and benefits of combination vaccines. What tests do I need? Blood tests  Lipid and cholesterol levels. These may be checked every 5 years, or more frequently if you are over 57 years old.  Hepatitis C test.  Hepatitis B test. Screening  Lung cancer screening. You may have this screening every year starting at age 3 if you have a 30-pack-year history of smoking and currently smoke or have quit within the past 15 years.  Prostate cancer screening. Recommendations will vary  depending on your family history and other risks.  Colorectal cancer screening. All adults should have this screening starting at age 71 and continuing until age 45. Your health care provider may recommend screening at age 74 if you are at increased risk. You will have tests every 1-10 years, depending on your results and the type of  screening test.  Diabetes screening. This is done by checking your blood sugar (glucose) after you have not eaten for a while (fasting). You may have this done every 1-3 years.  Sexually transmitted disease (STD) testing. Follow these instructions at home: Eating and drinking  Eat a diet that includes fresh fruits and vegetables, whole grains, lean protein, and low-fat dairy products.  Take vitamin and mineral supplements as recommended by your health care provider.  Do not drink alcohol if your health care provider tells you not to drink.  If you drink alcohol: ? Limit how much you have to 0-2 drinks a day. ? Be aware of how much alcohol is in your drink. In the [place] one drink equals one 12 oz bottle of beer (355 mL), one 5 oz glass of wine (148 mL), or one 1 oz glass of hard liquor (44 mL). Lifestyle  Take daily care of your teeth and gums.  Stay active. Exercise for at least 30 minutes on 5 or more days each week.  Do not use any products that contain nicotine or tobacco, such as cigarettes, e-cigarettes, and chewing tobacco. If you need help quitting, ask your health care provider.  If you are sexually active, practice safe sex. Use a condom or other form of protection to prevent STIs (sexually transmitted infections).  Talk with your health care provider about taking a low-dose aspirin every day starting at age 25. What's next?  Go to your health care provider once a year for a well check visit.  Ask your health care provider how often you should have your eyes and teeth checked.  Stay up to date on all vaccines. This information is not intended to replace advice given to you by your health care provider. Make sure you discuss any questions you have with your health care provider. Document Released: 09/13/2015 Document Revised: 08/11/2018 Document Reviewed: 08/11/2018 Elsevier Patient Education  2020 [employer].

## 2019-05-14 DIAGNOSIS — E119 Type 2 diabetes mellitus without complications: Secondary | ICD-10-CM

## 2019-05-15 MED ORDER — METFORMIN HCL 500 MG PO TABS: ORAL_TABLET | ORAL | 2 refills | Status: DC

## 2019-05-15 MED ORDER — METFORMIN HCL 500 MG PO TABS: ORAL_TABLET | ORAL | 5 refills | Status: DC

## 2019-05-30 ENCOUNTER — Other Ambulatory Visit: Payer: Self-pay | Admitting: Interventional Cardiology

## 2019-06-15 DIAGNOSIS — Z23 Encounter for immunization: Secondary | ICD-10-CM | POA: Diagnosis not present

## 2019-06-15 NOTE — Telephone Encounter (Signed)
Pt left v/m requesting cb to verify received pt message about shoes 

## 2019-06-15 NOTE — Telephone Encounter (Signed)
Pt left v/m requesting cb to verify received pt message about shoes

## 2019-06-21 NOTE — Telephone Encounter (Signed)
[facility] Night - Client Nonclinical Telephone Record AccessNurse Client [place] Night - Client Client Site [place] Physician Alma Friendly - NP Contact Type Call Who Is Calling Patient / Member / Family / Caregiver Caller Name Declined to provide Caller Phone Number [phone] Patient Name Jason Melendez Patient DOB [birth year] Call Type Message Only Information Provided Reason for Call Request to Speak to a Physician Initial Comment Caller states that he keeps missing the doctor. Additional Comment Caller states that she is trying to get a waiver for a prescription for where he works. They have been texting and not communicating well via text. Please have Belenda Cruise or Vallarie Mare call him back please. Call Closed By: Vito Backers Transaction Date/Time: 06/20/2019 6:15:06 PM (ET)

## 2019-06-22 NOTE — Telephone Encounter (Signed)
Patient is calling back about the letter he is requesting for his work.  He stated it was in his my chart but it is not longer there.  He is requesting a call back .

## 2019-06-22 NOTE — Telephone Encounter (Signed)
Jason Melendez, when you get a chance will you call patient? See messages below.

## 2019-06-22 NOTE — Telephone Encounter (Signed)
Message left for patient to return my call.  

## 2019-06-22 NOTE — Telephone Encounter (Signed)
Spoken to patient and faxed as requested per patient. 

## 2019-06-22 NOTE — Telephone Encounter (Signed)
I have spoken to patient regarding this letter for his shoe.  Patient stated that at his job there is a strict dress code. Everyone wear the same outfit down to the shoes. He have found a pair of shoe with arch support and fits well with steel toe. It was hard for him to find a shoe that fit well as these shoes. It is dark brown which is okay. However, the logo of the shoe maker is visible. He did show the shoes to HR and was told by HR that he need a letter (waiver) from his doctor so he could wear the shoes.

## 2019-06-22 NOTE — Telephone Encounter (Signed)
Noted. I attached a letter to his my chart account, can you print and stamp for up front to pick up?

## 2019-06-22 NOTE — Telephone Encounter (Signed)
Spoken to patient and faxed as requested per patient.

## 2019-09-07 ENCOUNTER — Ambulatory Visit: Payer: BC Managed Care – PPO | Attending: Internal Medicine

## 2019-09-07 DIAGNOSIS — Z20822 Contact with and (suspected) exposure to covid-19: Secondary | ICD-10-CM | POA: Diagnosis not present

## 2019-09-08 ENCOUNTER — Other Ambulatory Visit: Payer: BC Managed Care – PPO

## 2019-09-09 LAB — NOVEL CORONAVIRUS, NAA: SARS-CoV-2, NAA: DETECTED — AB

## 2019-10-27 ENCOUNTER — Ambulatory Visit: Payer: BC Managed Care – PPO | Admitting: Primary Care

## 2019-11-01 ENCOUNTER — Other Ambulatory Visit: Payer: Self-pay

## 2019-11-01 ENCOUNTER — Ambulatory Visit (INDEPENDENT_AMBULATORY_CARE_PROVIDER_SITE_OTHER): Payer: BC Managed Care – PPO | Admitting: Primary Care

## 2019-11-01 ENCOUNTER — Telehealth: Payer: Self-pay

## 2019-11-01 ENCOUNTER — Other Ambulatory Visit: Payer: Self-pay | Admitting: Primary Care

## 2019-11-01 ENCOUNTER — Encounter: Payer: Self-pay | Admitting: Primary Care

## 2019-11-01 VITALS — BP 136/86 | HR 78 | Temp 96.3°F | Ht 73.0 in | Wt 265.5 lb

## 2019-11-01 DIAGNOSIS — N63 Unspecified lump in unspecified breast: Secondary | ICD-10-CM | POA: Insufficient documentation

## 2019-11-01 DIAGNOSIS — E119 Type 2 diabetes mellitus without complications: Secondary | ICD-10-CM | POA: Diagnosis not present

## 2019-11-01 LAB — POCT GLYCOSYLATED HEMOGLOBIN (HGB A1C): Hemoglobin A1C: 6.2 % — AB (ref 4.0–5.6)

## 2019-11-01 NOTE — Assessment & Plan Note (Signed)
Obvious to right breast with inspection and palpation. Will order ultrasound and perhaps mammogram for evaluation. Non tender.

## 2019-11-01 NOTE — Progress Notes (Signed)
Subjective:    Patient ID: Jason Melendez, male    DOB: [birth year], 64 y.o.   MRN: [id number]  HPI  This visit occurred during the SARS-CoV-2 public health emergency.  Safety protocols were in place, including screening questions prior to the visit, additional usage of staff PPE, and extensive cleaning of exam room while observing appropriate contact time as indicated for disinfecting solutions.   Mr. Corwyn Vora is a 64 year old male with a history of CVA, type 2 diabetes, hypertension, CHF, hyperlipidemia who presents today for follow up of diabetes and a chief complaint of breast mass.  1) Type 2 Diabetes:  Current medications include: Metformin 500 mg BID  He is checking his blood glucose 0 times daily.   Last A1C: 6.3 in August 2020 Last Eye Exam: Due now. He will schedule.  Last Foot Exam: Due in August 2021 Pneumonia Vaccination: Completed in 2019 ACE/ARB: valsartan Statin: atorvastatin   BP Readings from Last 3 Encounters:  11/01/19 136/86  04/26/19 126/70  04/24/19 126/66   He's been checking his BP daily which is typically running 110's-120's/70's.   2) Breast Mass: He's been sleeping at his mothers house for the last 1.5 years due to her advanced Alzheimer's disease. Three weeks ago he was standing in front of her mirror, leaning forward and noticed a lump to the right breast. He notices a mass with palpation that feels soft, somewhat tender. He's not sure how long this mass has been present.   Review of Systems  Eyes: Negative for visual disturbance.  Respiratory: Negative for shortness of breath.   Cardiovascular: Negative for chest pain.  Skin: Negative for color change.       Right breast mass with tenderness  Neurological: Negative for headaches.       Past Medical History:  Diagnosis Date  . Diabetes mellitus without complication ([place])   . HTN (hypertension)   . Hyperlipidemia      Social History   Socioeconomic History  . Marital  status: Married    Spouse name: Not on file  . Number of children: Not on file  . Years of education: Not on file  . Highest education level: Not on file  Occupational History  . Not on file  Tobacco Use  . Smoking status: Never Smoker  . Smokeless tobacco: Never Used  Substance and Sexual Activity  . Alcohol use: No    Alcohol/week: 0.0 standard drinks  . Drug use: No  . Sexual activity: Not Currently  Other Topics Concern  . Not on file  Social History Narrative   Marital status: married      Children: 2 daughters      Lives:  With wife      Employment: printing work      Tobacco: none      Alcohol: one beer every three months      Drugs:  None      Exercise:  No formal exercise; walks up three flights of stairs several times per day at work.   Lives at home.  Works for [employer].     2 yrs college.     Social Determinants of Health   Financial Resource Strain:   . Difficulty of Paying Living Expenses: Not on file  Food Insecurity:   . Worried About Charity fundraiser in the Last Year: Not on file  . Ran Out of Food in the Last Year: Not on file  Transportation Needs:   .  Lack of Transportation (Medical): Not on file  . Lack of Transportation (Non-Medical): Not on file  Physical Activity:   . Days of Exercise per Week: Not on file  . Minutes of Exercise per Session: Not on file  Stress:   . Feeling of Stress : Not on file  Social Connections:   . Frequency of Communication with Friends and Family: Not on file  . Frequency of Social Gatherings with Friends and Family: Not on file  . Attends Religious Services: Not on file  . Active Member of Clubs or Organizations: Not on file  . Attends Archivist Meetings: Not on file  . Marital Status: Not on file  Intimate Partner Violence:   . Fear of Current or Ex-Partner: Not on file  . Emotionally Abused: Not on file  . Physically Abused: Not on file  . Sexually Abused: Not on file    Past  Surgical History:  Procedure Laterality Date  . ENDARTERECTOMY Left 10/16/2016   Procedure: ENDARTERECTOMY CAROTID;  Surgeon: Serafina Mitchell, MD;  Location: [facility];  Service: Vascular;  Laterality: Left;  . HERNIA REPAIR    . PATCH ANGIOPLASTY Left 10/16/2016   Procedure: PATCH ANGIOPLASTY USING Rueben Bash BIOLOGIC PATCH;  Surgeon: Serafina Mitchell, MD;  Location: [facility];  Service: Vascular;  Laterality: Left;    Family History  Problem Relation Age of Onset  . Diabetes Mother   . Heart disease Father 59       CABG  . Diabetes Father   . Kidney disease Father   . Kidney failure Father   . Hypertension Sister     Allergies  Allergen Reactions  . Hydralazine Anaphylaxis  . Hydralazine Hcl     Sudden drop in BP    Current Outpatient Medications on File Prior to Visit  Medication Sig Dispense Refill  . ACCU-CHEK FASTCLIX LANCETS MISC USE TO CHECK BLOOD SUGARS ONCE DAILY AS DIRECTED 102 each 5  . acetaminophen (TYLENOL 8 HOUR ARTHRITIS PAIN) 650 MG CR tablet Take 650 mg by mouth 2 (two) times daily.    Marland Kitchen aspirin 325 MG tablet Take 1 tablet (325 mg total) by mouth daily. 60 tablet 0  . atorvastatin (LIPITOR) 40 MG tablet Take 1 tablet (40 mg total) by mouth every evening. For cholesterol. 90 tablet 3  . Blood Glucose Monitoring Suppl (ACCU-CHEK GUIDE ME) w/Device KIT USE TO CHECK BLOOD SUGAR ONCE DAILY AS DIRECTED 1 kit 0  . carvedilol (COREG) 12.5 MG tablet Take 1 tablet (12.5 mg total) by mouth 2 (two) times daily with a meal. 180 tablet 3  . escitalopram (LEXAPRO) 10 MG tablet Take 1 tablet (10 mg total) by mouth daily. For anxiety. 90 tablet 3  . furosemide (LASIX) 20 MG tablet TAKE 1 TABLET BY MOUTH EVERY DAY AS NEEDED FOR FLUID/EDEMA (WEIGHT GAIN 3+ LBS OVERNIGHT) 90 tablet 1  . glucose blood (ACCU-CHEK GUIDE) test strip Accu-Chek Guide Use as instructed to test blood sugar up to 2 times daily. 100 each 1  . metFORMIN (GLUCOPHAGE) 500 MG tablet TAKE 1 TABLET BY MOUTH 2 TIMES DAILY  WITH A MEAL for diabetes. 180 tablet 2  . sacubitril-valsartan (ENTRESTO) 49-51 MG Take 1 tablet by mouth 2 (two) times daily. 180 tablet 3  . spironolactone (ALDACTONE) 25 MG tablet Take 1 tablet (25 mg total) by mouth daily. 90 tablet 3   No current facility-administered medications on file prior to visit.    BP 136/86   Pulse 78  Temp (!) 96.3 F (35.7 C) (Temporal)   Ht _0  (1.854 m)   Wt 265 lb 8 oz (120.4 kg)   SpO2 97%   BMI 35.03 kg/m    Objective:   Physical Exam  Constitutional: He appears well-nourished.  Cardiovascular: Normal rate and regular rhythm.  Respiratory: Effort normal and breath sounds normal. Right breast exhibits mass. Right breast exhibits no skin change and no tenderness. Left breast exhibits no mass, no skin change and no tenderness. Breasts are asymmetrical.    Right, soft, immobile chest mass to breast from 7 to 10 o'clock.   Musculoskeletal:     Cervical back: Neck supple.  Skin: Skin is warm and dry.           Assessment & Plan:

## 2019-11-01 NOTE — Assessment & Plan Note (Signed)
A1C today of 6.2 which is stable and at goal. Continue Metformin 500 mg BID.  Managed on statin and ARB. Foot exam UTD. He will schedule an eye exam. Pneumonia vaccination UTD.  Follow up in 6 months.

## 2019-11-01 NOTE — Telephone Encounter (Signed)
Appears pt had visit 11/01/19 at 7:20 AM.

## 2019-11-01 NOTE — Patient Instructions (Signed)
You will be contacted regarding your ultrasound.  Please let us know if you have not been contacted within two weeks.   Keep up the great work, your A1C looks great!  We will see you in 6 months for your annual physical.  It was a pleasure to see you today!

## 2019-11-01 NOTE — Telephone Encounter (Signed)
Noted must have been our screening call for COVID

## 2019-11-01 NOTE — Telephone Encounter (Signed)
[facility] Night - Client Nonclinical Telephone Record AccessNurse Client [facility] Night - Client Client Site [facility] - Night Physician [facility] - PHYSICIAN, Crissie Figures- MD Contact Type Call Who Is Calling Patient / Member / Family / Caregiver Caller Name AUSTINE WIEDEMAN Phone Number [phone] Call Type Message Only Information Provided Reason for Call Returning a Call from the Office Initial Comment Caller states he missed a call from the office Additional Comment Disp. Time Disposition Final User 10/31/2019 5:48:23 PM General Information Provided Yes [place], Tobi Bastos

## 2019-11-15 ENCOUNTER — Other Ambulatory Visit: Payer: Self-pay

## 2019-11-15 ENCOUNTER — Ambulatory Visit
Admission: RE | Admit: 2019-11-15 | Discharge: 2019-11-15 | Disposition: A | Discharge status: Home / Self Care | Payer: BC Managed Care – PPO | Source: Ambulatory Visit | Attending: Primary Care | Admitting: Primary Care

## 2019-11-15 ENCOUNTER — Ambulatory Visit: Payer: BC Managed Care – PPO

## 2019-11-15 DIAGNOSIS — R928 Other abnormal and inconclusive findings on diagnostic imaging of breast: Secondary | ICD-10-CM | POA: Diagnosis not present

## 2019-11-15 DIAGNOSIS — N63 Unspecified lump in unspecified breast: Secondary | ICD-10-CM

## 2020-03-05 ENCOUNTER — Other Ambulatory Visit: Payer: Self-pay | Admitting: Primary Care

## 2020-03-17 ENCOUNTER — Other Ambulatory Visit: Payer: Self-pay | Admitting: Nurse Practitioner

## 2020-03-17 ENCOUNTER — Other Ambulatory Visit: Payer: Self-pay | Admitting: Primary Care

## 2020-03-17 DIAGNOSIS — E7849 Other hyperlipidemia: Secondary | ICD-10-CM

## 2020-03-18 NOTE — Progress Notes (Signed)
Cardiology Office Note:    Date:  03/18/2020   ID:  Jason Melendez, DOB 1955/11/29, MRN [id number]  PCP:  Doreene Nest, NP  Cardiologist:  Lesleigh Noe, MD   Referring MD: Doreene Nest, NP   No chief complaint on file.   History of Present Illness:    Jason Melendez is a 64 y.o. male with a hx of left brain stroke,high-grade left carotid stenosis treated with surgery by Dr. 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 He has not had cardiac cath.   No cardiac complaints.  Breathing is okay.  Sleeps in a recliner.  No orthopnea or PND.  Has not had angina.  No episodes of lower extremity swelling.  Not needed use sublingual nitroglycerin.  No edema has been noted.  Past Medical History:  Diagnosis Date  . Diabetes mellitus without complication (HCC)   . HTN (hypertension)   . Hyperlipidemia     Past Surgical History:  Procedure Laterality Date  . ENDARTERECTOMY Left 10/16/2016   Procedure: ENDARTERECTOMY CAROTID;  Surgeon: Nada Libman, MD;  Location: [facility] OR;  Service: Vascular;  Laterality: Left;  . HERNIA REPAIR    . PATCH ANGIOPLASTY Left 10/16/2016   Procedure: PATCH ANGIOPLASTY USING Livia Snellen BIOLOGIC PATCH;  Surgeon: Nada Libman, MD;  Location: [facility] OR;  Service: Vascular;  Laterality: Left;    Current Medications: No outpatient medications have been marked as taking for the 03/19/20 encounter (Appointment) with Lyn Records, MD.     Allergies:   Hydralazine and Hydralazine hcl   Social History   Socioeconomic History  . Marital status: Married    Spouse name: Not on file  . Number of children: Not on file  . Years of education: Not on file  . Highest  education level: Not on file  Occupational History  . Not on file  Tobacco Use  . Smoking status: Never Smoker  . Smokeless tobacco: Never Used  Vaping Use  . Vaping Use: Never used  Substance and Sexual Activity  . Alcohol use: No    Alcohol/week: 0.0 standard drinks  . Drug use: No  . Sexual activity: Not Currently  Other Topics Concern  . Not on file  Social History Narrative   Marital status: married      Children: 2 daughters      Lives:  With wife      Employment: printing work      Tobacco: none      Alcohol: one beer every three months      Drugs:  None      Exercise:  No formal exercise; walks up three flights of stairs several times per day at work.   Lives at home.  Works for [employer].     2 yrs college.     Social Determinants of Health   Financial Resource Strain:   . Difficulty of Paying Living Expenses:   Food Insecurity:   . Worried About Programme researcher, broadcasting/film/video in the Last Year:   . Barista in the Last Year:   Transportation Needs:   . Freight forwarder (Medical):   Marland Kitchen Lack of Transportation (Non-Medical):   Physical Activity:   . Days of Exercise per Week:   .  Minutes of Exercise per Session:   Stress:   . Feeling of Stress :   Social Connections:   . Frequency of Communication with Friends and Family:   . Frequency of Social Gatherings with Friends and Family:   . Attends Religious Services:   . Active Member of Clubs or Organizations:   . Attends Banker Meetings:   Marland Kitchen Marital Status:      Family History: The patient's family history includes Diabetes in his father and mother; Heart disease (age of onset: 5) in his father; Hypertension in his sister; Kidney disease in his father; Kidney failure in his father.  ROS:   Please see the history of present illness.    Doing well.  Working without difficulty.  All other systems reviewed and are negative.  EKGs/Labs/Other Studies Reviewed:    The following  studies were reviewed today: 2D Doppler echocardiogram 2020 IMPRESSIONS    1. The left ventricle has mild-moderately reduced systolic function, with  an ejection fraction of 40-45%. The cavity size was mildly dilated. Left  ventricular diastolic parameters were normal. Left ventricular diffuse  hypokinesis.  2. The right ventricle has normal systolic function. The cavity was  normal.  3. The mitral valve is grossly normal.  4. The tricuspid valve is grossly normal.  5. The aortic valve is tricuspid. No stenosis of the aortic valve.  6. There is mild dilatation of the ascending aorta measuring 39 mm.  7. Mild to moderate global reduction in LV systolic function; mild LVE;  mildly dilated ascending aorta.   EKG:  EKG normal sinus rhythm, PVC, no change when compared to August 2020.  Recent Labs: No results found for requested labs within last 8760 hours.  Recent Lipid Panel    Component Value Date/Time   CHOL 109 02/27/2019 0810   TRIG 144 02/27/2019 0810   HDL 26 (L) 02/27/2019 0810   CHOLHDL 4.2 02/27/2019 0810   CHOLHDL 4 03/04/2018 0745   VLDL 41.2 (H) 03/04/2018 0745   LDLCALC 54 02/27/2019 0810   LDLDIRECT 52.0 03/04/2018 0745    Physical Exam:    VS:  There were no vitals taken for this visit.    Wt Readings from Last 3 Encounters:  11/01/19 265 lb 8 oz (120.4 kg)  04/26/19 271 lb 4 oz (123 kg)  04/24/19 271 lb (122.9 kg)     GEN: Obesity. No acute distress HEENT: Normal NECK: No JVD. LYMPHATICS: No lymphadenopathy CARDIAC:  RRR without murmur, S4 gallop gallop, or edema. VASCULAR:  Normal Pulses. No bruits. RESPIRATORY:  Clear to auscultation without rales, wheezing or rhonchi  ABDOMEN: Soft, non-tender, non-distended, No pulsatile mass, MUSCULOSKELETAL: No deformity  SKIN: Warm and dry NEUROLOGIC:  Alert and oriented x 3 PSYCHIATRIC:  Normal affect   ASSESSMENT:    1. Chronic combined systolic and diastolic heart failure (HCC)   2.  Hypertensive heart disease without CHF   3. Other hyperlipidemia   4. Cerebrovascular accident (CVA) due to thrombosis of precerebral artery (HCC)   5. Enlarged aorta (HCC)   6. Prediabetes   7. Educated about COVID-19 virus infection    PLAN:    In order of problems listed above:  1. Entresto, Aldactone, carvedilol, and furosemide should be continued.  Comprehensive metabolic panel will be checked to ensure that potassium level is not out of range.  He will return in 6 months for clinical follow-up.  At sometime in 2022 we will likely repeat a 2D Doppler echocardiogram to make  sure the LV function is being maintained.  If not, consideration of SGLT2 therapy can be given at that time. 2. Blood pressures under excellent control from the impact of Entresto, Aldactone, carvedilol, and furosemide. 3. Lipid panel will be drawn with the next blood work.  Continue Lipitor 40 mg/day. 4. No new neurological complaints. 5. Continue beta-blocker and ARB therapy to prevent progression. 6. A1c was 6.2 in March.  Consider adding SGLT2 therapy in the future especially if LV function does not completely normalize. 7. Vaccination and social distancing is being practiced.  61-month follow-up   Medication Adjustments/Labs and Tests Ordered: Current medicines are reviewed at length with the patient today.  Concerns regarding medicines are outlined above.  No orders of the defined types were placed in this encounter.  No orders of the defined types were placed in this encounter.   There are no Patient Instructions on file for this visit.   Signed, Lesleigh Noe, MD  03/18/2020 1:15 PM    [facility]

## 2020-03-19 ENCOUNTER — Encounter: Payer: Self-pay | Admitting: Interventional Cardiology

## 2020-03-19 ENCOUNTER — Ambulatory Visit: Payer: BC Managed Care – PPO | Admitting: Interventional Cardiology

## 2020-03-19 ENCOUNTER — Other Ambulatory Visit: Payer: Self-pay

## 2020-03-19 VITALS — BP 112/70 | HR 74 | Ht 73.0 in | Wt 266.0 lb

## 2020-03-19 DIAGNOSIS — E7849 Other hyperlipidemia: Secondary | ICD-10-CM | POA: Diagnosis not present

## 2020-03-19 DIAGNOSIS — I5042 Chronic combined systolic (congestive) and diastolic (congestive) heart failure: Secondary | ICD-10-CM | POA: Diagnosis not present

## 2020-03-19 DIAGNOSIS — I119 Hypertensive heart disease without heart failure: Secondary | ICD-10-CM | POA: Diagnosis not present

## 2020-03-19 DIAGNOSIS — Z7189 Other specified counseling: Secondary | ICD-10-CM

## 2020-03-19 DIAGNOSIS — I6300 Cerebral infarction due to thrombosis of unspecified precerebral artery: Secondary | ICD-10-CM

## 2020-03-19 DIAGNOSIS — I7789 Other specified disorders of arteries and arterioles: Secondary | ICD-10-CM

## 2020-03-19 DIAGNOSIS — R7303 Prediabetes: Secondary | ICD-10-CM

## 2020-03-19 NOTE — Patient Instructions (Signed)
Medication Instructions:  Your physician recommends that you continue on your current medications as directed. Please refer to the Current Medication list given to you today.  *If you need a refill on your cardiac medications before your next appointment, please call your pharmacy*   Lab Work: Lipid, liver, BMET and CBC when you are fasting(nothing to eat or drink after midnight except water or black coffee) If you have labs (blood work) drawn today and your tests are completely normal, you will receive your results only by: Marland Kitchen MyChart Message (if you have MyChart) OR . A paper copy in the mail If you have any lab test that is abnormal or we need to change your treatment, we will call you to review the results.   Testing/Procedures: None   Follow-Up: At [facility], you and your health needs are our priority.  As part of our continuing mission to provide you with exceptional heart care, we have created designated Provider Care Teams.  These Care Teams include your primary Cardiologist (physician) and Advanced Practice Providers (APPs -  Physician Assistants and Nurse Practitioners) who all work together to provide you with the care you need, when you need it.  We recommend signing up for the patient portal called "MyChart".  Sign up information is provided on this After Visit Summary.  MyChart is used to connect with patients for Virtual Visits (Telemedicine).  Patients are able to view lab/test results, encounter notes, upcoming appointments, etc.  Non-urgent messages can be sent to your provider as well.   To learn more about what you can do with MyChart, go to [email or web address].    Your next appointment:   6 month(s)  The format for your next appointment:   In Person  Provider:   You may see Lesleigh Noe, MD or one of the following Advanced Practice Providers on your designated Care Team:    Norma Fredrickson, NP  Nada Boozer, NP  Georgie Chard, NP

## 2020-03-20 NOTE — Addendum Note (Signed)
Addended by: Solon Augusta on: 03/20/2020 02:05 PM   Modules accepted: Orders

## 2020-03-22 ENCOUNTER — Other Ambulatory Visit: Payer: BC Managed Care – PPO | Admitting: *Deleted

## 2020-03-22 ENCOUNTER — Other Ambulatory Visit: Payer: Self-pay

## 2020-03-22 DIAGNOSIS — I5042 Chronic combined systolic (congestive) and diastolic (congestive) heart failure: Secondary | ICD-10-CM

## 2020-03-22 DIAGNOSIS — E7849 Other hyperlipidemia: Secondary | ICD-10-CM | POA: Diagnosis not present

## 2020-03-22 DIAGNOSIS — I119 Hypertensive heart disease without heart failure: Secondary | ICD-10-CM

## 2020-03-22 DIAGNOSIS — I6300 Cerebral infarction due to thrombosis of unspecified precerebral artery: Secondary | ICD-10-CM | POA: Diagnosis not present

## 2020-03-22 LAB — BASIC METABOLIC PANEL
BUN/Creatinine Ratio: 17 (ref 10–24)
BUN: 19 mg/dL (ref 8–27)
CO2: 23 mmol/L (ref 20–29)
Calcium: 9 mg/dL (ref 8.6–10.2)
Chloride: 104 mmol/L (ref 96–106)
Creatinine, Ser: 1.09 mg/dL (ref 0.76–1.27)
GFR calc Af Amer: 83 mL/min/{1.73_m2} (ref >59)
GFR calc non Af Amer: 72 mL/min/{1.73_m2} (ref >59)
Glucose: 149 mg/dL — ABNORMAL HIGH (ref 65–99)
Potassium: 4.9 mmol/L (ref 3.5–5.2)
Sodium: 140 mmol/L (ref 134–144)

## 2020-03-22 LAB — HEPATIC FUNCTION PANEL
ALT: 9 IU/L (ref 0–44)
AST: 15 IU/L (ref 0–40)
Albumin: 4.1 g/dL (ref 3.8–4.8)
Alkaline Phosphatase: 37 IU/L — ABNORMAL LOW (ref 48–121)
Bilirubin Total: 0.5 mg/dL (ref 0.0–1.2)
Bilirubin, Direct: 0.15 mg/dL (ref 0.00–0.40)
Total Protein: 6.6 g/dL (ref 6.0–8.5)

## 2020-03-22 LAB — LIPID PANEL
Chol/HDL Ratio: 4.6 ratio (ref 0.0–5.0)
Cholesterol, Total: 111 mg/dL (ref 100–199)
HDL: 24 mg/dL — ABNORMAL LOW (ref >39)
LDL Chol Calc (NIH): 60 mg/dL (ref 0–99)
Triglycerides: 156 mg/dL — ABNORMAL HIGH (ref 0–149)
VLDL Cholesterol Cal: 27 mg/dL (ref 5–40)

## 2020-03-22 LAB — CBC
Hematocrit: 37.8 % (ref 37.5–51.0)
Hemoglobin: 12.7 g/dL — ABNORMAL LOW (ref 13.0–17.7)
MCH: 31.4 pg (ref 26.6–33.0)
MCHC: 33.6 g/dL (ref 31.5–35.7)
MCV: 94 fL (ref 79–97)
Platelets: 150 10*3/uL (ref 150–450)
RBC: 4.04 x10E6/uL — ABNORMAL LOW (ref 4.14–5.80)
RDW: 12.3 % (ref 11.6–15.4)
WBC: 5.8 10*3/uL (ref 3.4–10.8)

## 2020-05-02 ENCOUNTER — Telehealth: Payer: Self-pay

## 2020-05-02 ENCOUNTER — Other Ambulatory Visit: Payer: Self-pay | Admitting: Primary Care

## 2020-05-02 NOTE — Telephone Encounter (Signed)
LVM for pt to call clinic.  Per Mayra Reel, pt does not need to come in for lab appt on Tuesday 9.7.2021 as he had labs through cardiology recently.  I am canceling lab appt, please inform pt not to come in

## 2020-05-07 ENCOUNTER — Other Ambulatory Visit: Payer: BC Managed Care – PPO

## 2020-05-08 ENCOUNTER — Encounter: Payer: Self-pay | Admitting: Primary Care

## 2020-05-08 ENCOUNTER — Ambulatory Visit (INDEPENDENT_AMBULATORY_CARE_PROVIDER_SITE_OTHER): Payer: BC Managed Care – PPO | Admitting: Primary Care

## 2020-05-08 ENCOUNTER — Other Ambulatory Visit: Payer: Self-pay

## 2020-05-08 VITALS — BP 140/76 | HR 78 | Ht 73.0 in | Wt 272.0 lb

## 2020-05-08 DIAGNOSIS — M79605 Pain in left leg: Secondary | ICD-10-CM

## 2020-05-08 DIAGNOSIS — I5042 Chronic combined systolic (congestive) and diastolic (congestive) heart failure: Secondary | ICD-10-CM

## 2020-05-08 DIAGNOSIS — Z8673 Personal history of transient ischemic attack (TIA), and cerebral infarction without residual deficits: Secondary | ICD-10-CM

## 2020-05-08 DIAGNOSIS — Z0000 Encounter for general adult medical examination without abnormal findings: Secondary | ICD-10-CM | POA: Diagnosis not present

## 2020-05-08 DIAGNOSIS — M79604 Pain in right leg: Secondary | ICD-10-CM

## 2020-05-08 DIAGNOSIS — I10 Essential (primary) hypertension: Secondary | ICD-10-CM

## 2020-05-08 DIAGNOSIS — E119 Type 2 diabetes mellitus without complications: Secondary | ICD-10-CM

## 2020-05-08 DIAGNOSIS — G8929 Other chronic pain: Secondary | ICD-10-CM | POA: Insufficient documentation

## 2020-05-08 DIAGNOSIS — R55 Syncope and collapse: Secondary | ICD-10-CM

## 2020-05-08 DIAGNOSIS — Z125 Encounter for screening for malignant neoplasm of prostate: Secondary | ICD-10-CM | POA: Diagnosis not present

## 2020-05-08 DIAGNOSIS — F411 Generalized anxiety disorder: Secondary | ICD-10-CM

## 2020-05-08 DIAGNOSIS — E7849 Other hyperlipidemia: Secondary | ICD-10-CM

## 2020-05-08 LAB — HEMOGLOBIN A1C: Hgb A1c MFr Bld: 7 % — ABNORMAL HIGH (ref 4.6–6.5)

## 2020-05-08 LAB — PSA: PSA: 0.71 ng/mL (ref 0.10–4.00)

## 2020-05-08 NOTE — Assessment & Plan Note (Signed)
Agree to provide work note given history of syncope.  See HPI for details.

## 2020-05-08 NOTE — Assessment & Plan Note (Signed)
Appears euvolemic, continue furosemide, beta blocker, and spironolactone as prescribed. Following with cardiology.

## 2020-05-08 NOTE — Assessment & Plan Note (Signed)
Overall doing well on Lexapro, continue same.

## 2020-05-08 NOTE — Assessment & Plan Note (Signed)
No new symptoms, but does feel effects of fatigue with new occupational requirements. Work note provided, handicap placard provided.

## 2020-05-08 NOTE — Telephone Encounter (Signed)
Faxed letter to [phone]

## 2020-05-08 NOTE — Assessment & Plan Note (Signed)
Repeat A1C pending. Discussed the importance of a healthy diet and regular exercise in order for weight loss, and to reduce the risk of any potential medical problems.  Eye exam due next month. Foot exam UTD. Pneumonia vaccination UTD. Managed on statin.  Follow up in 6 months.

## 2020-05-08 NOTE — Progress Notes (Signed)
Subjective:    Patient ID: Jason Melendez, male    DOB: [birth year], 64 y.o.   MRN: [id number]  HPI  This visit occurred during the SARS-CoV-2 public health emergency.  Safety protocols were in place, including screening questions prior to the visit, additional usage of staff PPE, and extensive cleaning of exam room while observing appropriate contact time as indicated for disinfecting solutions.   [name] is a 64 year old male who presents today for complete physical.  He would also like to discuss physical activity limitations, and is requesting a work note and handicap placard.  He is requesting a handicap placard as he cannot walking long distances due to chronic lower extremity pain. He often has to rely on a shopping cart for support when shopping at the store. History of CVA in 2018, also with chronic lower extremity pain from knee injury and fractured ankle, history of syncope, CHF.   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 When working as a Regulatory affairs officer he is able to sit at his desk to preform job duties without difficulty. About once weekly he's required to work in quality control which is located in an 73 degree press room with concrete floors. Duties for quality control include standing/walking on concrete, and climbing machinery for 6-12 hours without ability to rest.  After two hours of this work he finds a significantly increased amount of bilateral lower extremity pain including knees, and ankles. He also finds himself fatigued in the warmer climate. This causes a moderate amount of distress. He is needing a work note sent to [phone], attention to [employer]: Victoriano Lain.  Immunizations: -Tetanus: Completed in 2016 -Influenza: Due this season, will get at work -Shingles: Never completed, declines   -Pneumonia: Completed last in  2019 -Covid-19: Completed series  Diet: He endorses a healthy diet. Exercise: No regular exercise, he is active at work.   Eye exam: Due, he will schedule  Dental exam: No recent visit   Colonoscopy: Completed Cologuard in 2019, negative PSA: 1.16 in 2018 Hep C Screen: Negative   BP Readings from Last 3 Encounters:  05/08/20 140/76  03/19/20 112/70  11/01/19 136/86      Review of Systems  Constitutional: Negative for unexpected weight change.  HENT: Negative for rhinorrhea.   Respiratory: Negative for cough and shortness of breath.   Cardiovascular: Negative for chest pain.  Gastrointestinal: Negative for constipation and diarrhea.  Genitourinary: Negative for difficulty urinating.  Musculoskeletal: Positive for arthralgias and myalgias.  Skin: Negative for rash.  Allergic/Immunologic: Negative for environmental allergies.  Neurological: Negative for dizziness and headaches.  Psychiatric/Behavioral: The patient is not nervous/anxious.        Past Medical History:  Diagnosis Date  . Diabetes mellitus without complication ([place])   . HTN (hypertension)   . Hyperlipidemia      Social History   Socioeconomic History  . Marital status: Married    Spouse name: Not on file  . Number of children: Not on file  . Years of education: Not on file  . Highest education level: Not on file  Occupational History  . Not on file  Tobacco Use  . Smoking status: Never Smoker  . Smokeless tobacco: Never Used  Vaping Use  . Vaping Use: Never used  Substance and Sexual Activity  . Alcohol use: No  Alcohol/week: 0.0 standard drinks  . Drug use: No  . Sexual activity: Not Currently  Other Topics Concern  . Not on file  Social History Narrative   Marital status: married      Children: 2 daughters      Lives:  With wife      Employment: printing work      Tobacco: none      Alcohol: one beer every three months      Drugs:  None      Exercise:  No formal exercise; walks up  three flights of stairs several times per day at work.   Lives at home.  Works for [employer].     2 yrs college.     Social Determinants of Health   Financial Resource Strain:   . Difficulty of Paying Living Expenses: Not on file  Food Insecurity:   . Worried About Charity fundraiser in the Last Year: Not on file  . Ran Out of Food in the Last Year: Not on file  Transportation Needs:   . Lack of Transportation (Medical): Not on file  . Lack of Transportation (Non-Medical): Not on file  Physical Activity:   . Days of Exercise per Week: Not on file  . Minutes of Exercise per Session: Not on file  Stress:   . Feeling of Stress : Not on file  Social Connections:   . Frequency of Communication with Friends and Family: Not on file  . Frequency of Social Gatherings with Friends and Family: Not on file  . Attends Religious Services: Not on file  . Active Member of Clubs or Organizations: Not on file  . Attends Archivist Meetings: Not on file  . Marital Status: Not on file  Intimate Partner Violence:   . Fear of Current or Ex-Partner: Not on file  . Emotionally Abused: Not on file  . Physically Abused: Not on file  . Sexually Abused: Not on file    Past Surgical History:  Procedure Laterality Date  . ENDARTERECTOMY Left 10/16/2016   Procedure: ENDARTERECTOMY CAROTID;  Surgeon: Serafina Mitchell, MD;  Location: [place];  Service: Vascular;  Laterality: Left;  . HERNIA REPAIR    . PATCH ANGIOPLASTY Left 10/16/2016   Procedure: PATCH ANGIOPLASTY USING Rueben Bash BIOLOGIC PATCH;  Surgeon: Serafina Mitchell, MD;  Location: [place] OR;  Service: Vascular;  Laterality: Left;    Family History  Problem Relation Age of Onset  . Diabetes Mother   . Heart disease Father 54       CABG  . Diabetes Father   . Kidney disease Father   . Kidney failure Father   . Hypertension Sister     Allergies  Allergen Reactions  . Hydralazine Anaphylaxis  . Hydralazine Hcl     Sudden  drop in BP    Current Outpatient Medications on File Prior to Visit  Medication Sig Dispense Refill  . ACCU-CHEK FASTCLIX LANCETS MISC USE TO CHECK BLOOD SUGARS ONCE DAILY AS DIRECTED 102 each 5  . ACCU-CHEK GUIDE test strip USE TO CHECK BLOOD SUGARS ONCE DAILY AS DIRECTED 100 strip 5  . acetaminophen (TYLENOL 8 HOUR ARTHRITIS PAIN) 650 MG CR tablet Take 650 mg by mouth 2 (two) times daily.    Marland Kitchen aspirin 325 MG tablet Take 1 tablet (325 mg total) by mouth daily. 60 tablet 0  . atorvastatin (LIPITOR) 40 MG tablet TAKE 1 TABLET (40 MG TOTAL) BY MOUTH EVERY EVENING. FOR CHOLESTEROL.  90 tablet 1  . Blood Glucose Monitoring Suppl (ACCU-CHEK GUIDE ME) w/Device KIT USE TO CHECK BLOOD SUGAR ONCE DAILY AS DIRECTED 1 kit 0  . carvedilol (COREG) 12.5 MG tablet TAKE 1 TABLET (12.5 MG TOTAL) BY MOUTH 2 (TWO) TIMES DAILY WITH A MEAL. 180 tablet 3  . ENTRESTO 49-51 MG TAKE 1 TABLET BY MOUTH TWICE A DAY 180 tablet 3  . escitalopram (LEXAPRO) 10 MG tablet Take 1 tablet (10 mg total) by mouth daily. For anxiety. 90 tablet 3  . furosemide (LASIX) 20 MG tablet TAKE 1 TABLET BY MOUTH EVERY DAY AS NEEDED FOR FLUID/EDEMA (WEIGHT GAIN 3+ LBS OVERNIGHT) 90 tablet 1  . metFORMIN (GLUCOPHAGE) 500 MG tablet TAKE 1 TABLET BY MOUTH 2 TIMES DAILY WITH A MEAL for diabetes. 180 tablet 2  . spironolactone (ALDACTONE) 25 MG tablet Take 1 tablet (25 mg total) by mouth daily. 90 tablet 3   No current facility-administered medications on file prior to visit.    BP 140/76   Pulse 78   Ht '6\' 1"'  (1.854 m)   Wt 272 lb (123.4 kg)   SpO2 96%   BMI 35.89 kg/m    Objective:   Physical Exam HENT:     Right Ear: Tympanic membrane and ear canal normal.     Left Ear: Tympanic membrane and ear canal normal.  Eyes:     Pupils: Pupils are equal, round, and reactive to light.  Cardiovascular:     Rate and Rhythm: Normal rate and regular rhythm.  Pulmonary:     Effort: Pulmonary effort is normal.     Breath sounds: Normal breath  sounds.  Abdominal:     General: Bowel sounds are normal.     Palpations: Abdomen is soft.     Tenderness: There is no abdominal tenderness.  Musculoskeletal:        General: Normal range of motion.     Cervical back: Neck supple.     Comments: Ambulates well in clinic   Skin:    General: Skin is warm and dry.  Neurological:     Mental Status: He is alert and oriented to person, place, and time.     Cranial Nerves: No cranial nerve deficit.     Deep Tendon Reflexes:     Reflex Scores:      Patellar reflexes are 2+ on the right side and 2+ on the left side. Psychiatric:        Mood and Affect: Mood normal.            Assessment & Plan:

## 2020-05-08 NOTE — Assessment & Plan Note (Signed)
Lipids stable from labs in July 2021. Continue atorvastatin.

## 2020-05-08 NOTE — Assessment & Plan Note (Signed)
Above goal in the office today, prior readings are better. Continue carvedilol and spirolactone. Will have him closely monitor at home.

## 2020-05-08 NOTE — Telephone Encounter (Signed)
Pt is following up why his letter hasn't been faxed to   ATTN Darlyne [employer] Fax # [phone]  I let him know it should be sent today.

## 2020-05-08 NOTE — Assessment & Plan Note (Signed)
Declines Shingrix vaccines. Will get influenza vaccine at work.  PSA due and pending. Colon cancer screening due in 2022. Discussed the importance of a healthy diet and regular exercise in order for weight loss, and to reduce the risk of any potential medical problems.  Exam today stable. Labs reviewed and pending.

## 2020-05-08 NOTE — Patient Instructions (Signed)
Stop by the lab prior to leaving today. I will notify you of your results once received.   Start exercising. You should be getting 150 minutes of moderate intensity exercise weekly.  Be sure to work on a health diet. Ensure you are consuming 64 ounces of water daily.  Please schedule a follow up appointment in 6 months for diabetes check.   It was a pleasure to see you today!   Preventive Care 98-64 Years Old, Male Preventive care refers to lifestyle choices and visits with your health care provider that can promote health and wellness. This includes:  A yearly physical exam. This is also called an annual well check.  Regular dental and eye exams.  Immunizations.  Screening for certain conditions.  Healthy lifestyle choices, such as eating a healthy diet, getting regular exercise, not using drugs or products that contain nicotine and tobacco, and limiting alcohol use. What can I expect for my preventive care visit? Physical exam Your health care provider will check:  Height and weight. These may be used to calculate body mass index (BMI), which is a measurement that tells if you are at a healthy weight.  Heart rate and blood pressure.  Your skin for abnormal spots. Counseling Your health care provider may ask you questions about:  Alcohol, tobacco, and drug use.  Emotional well-being.  Home and relationship well-being.  Sexual activity.  Eating habits.  Work and work Statistician. What immunizations do I need?  Influenza (flu) vaccine  This is recommended every year. Tetanus, diphtheria, and pertussis (Tdap) vaccine  You may need a Td booster every 10 years. Varicella (chickenpox) vaccine  You may need this vaccine if you have not already been vaccinated. Zoster (shingles) vaccine  You may need this after age 46. Measles, mumps, and rubella (MMR) vaccine  You may need at least one dose of MMR if you were born in 1957 or later. You may also need a second  dose. Pneumococcal conjugate (PCV13) vaccine  You may need this if you have certain conditions and were not previously vaccinated. Pneumococcal polysaccharide (PPSV23) vaccine  You may need one or two doses if you smoke cigarettes or if you have certain conditions. Meningococcal conjugate (MenACWY) vaccine  You may need this if you have certain conditions. Hepatitis A vaccine  You may need this if you have certain conditions or if you travel or work in places where you may be exposed to hepatitis A. Hepatitis B vaccine  You may need this if you have certain conditions or if you travel or work in places where you may be exposed to hepatitis B. Haemophilus influenzae type b (Hib) vaccine  You may need this if you have certain risk factors. Human papillomavirus (HPV) vaccine  If recommended by your health care provider, you may need three doses over 6 months. You may receive vaccines as individual doses or as more than one vaccine together in one shot (combination vaccines). Talk with your health care provider about the risks and benefits of combination vaccines. What tests do I need? Blood tests  Lipid and cholesterol levels. These may be checked every 5 years, or more frequently if you are over 24 years old.  Hepatitis C test.  Hepatitis B test. Screening  Lung cancer screening. You may have this screening every year starting at age 56 if you have a 30-pack-year history of smoking and currently smoke or have quit within the past 15 years.  Prostate cancer screening. Recommendations will vary depending on  your family history and other risks.  Colorectal cancer screening. All adults should have this screening starting at age 55 and continuing until age 80. Your health care provider may recommend screening at age 52 if you are at increased risk. You will have tests every 1-10 years, depending on your results and the type of screening test.  Diabetes screening. This is done by  checking your blood sugar (glucose) after you have not eaten for a while (fasting). You may have this done every 1-3 years.  Sexually transmitted disease (STD) testing. Follow these instructions at home: Eating and drinking  Eat a diet that includes fresh fruits and vegetables, whole grains, lean protein, and low-fat dairy products.  Take vitamin and mineral supplements as recommended by your health care provider.  Do not drink alcohol if your health care provider tells you not to drink.  If you drink alcohol: ? Limit how much you have to 0-2 drinks a day. ? Be aware of how much alcohol is in your drink. In the [place] one drink equals one 12 oz bottle of beer (355 mL), one 5 oz glass of wine (148 mL), or one 1 oz glass of hard liquor (44 mL). Lifestyle  Take daily care of your teeth and gums.  Stay active. Exercise for at least 30 minutes on 5 or more days each week.  Do not use any products that contain nicotine or tobacco, such as cigarettes, e-cigarettes, and chewing tobacco. If you need help quitting, ask your health care provider.  If you are sexually active, practice safe sex. Use a condom or other form of protection to prevent STIs (sexually transmitted infections).  Talk with your health care provider about taking a low-dose aspirin every day starting at age 65. What's next?  Go to your health care provider once a year for a well check visit.  Ask your health care provider how often you should have your eyes and teeth checked.  Stay up to date on all vaccines. This information is not intended to replace advice given to you by your health care provider. Make sure you discuss any questions you have with your health care provider. Document Revised: 08/11/2018 Document Reviewed: 08/11/2018 Elsevier Patient Education  2020 [employer].

## 2020-05-08 NOTE — Assessment & Plan Note (Signed)
Chronic for years, history of knee and ankle injuries.  Exam today stable.  Agree to provide work note for activity limitation given history of CVA, chronic arthralgia, syncope. Handicap placard also provided.

## 2020-05-09 DIAGNOSIS — E119 Type 2 diabetes mellitus without complications: Secondary | ICD-10-CM

## 2020-05-09 MED ORDER — METFORMIN HCL 1000 MG PO TABS: 1000.0000 mg | ORAL_TABLET | Freq: Two times a day (BID) | ORAL | 3 refills | Status: DC

## 2020-05-14 ENCOUNTER — Other Ambulatory Visit: Payer: Self-pay | Admitting: Nurse Practitioner

## 2020-06-01 ENCOUNTER — Other Ambulatory Visit: Payer: Self-pay

## 2020-06-01 ENCOUNTER — Ambulatory Visit (HOSPITAL_COMMUNITY)
Admission: EM | Admit: 2020-06-01 | Discharge: 2020-06-01 | Disposition: A | Discharge status: Home / Self Care | Payer: BC Managed Care – PPO | Attending: Family Medicine | Admitting: Family Medicine

## 2020-06-01 ENCOUNTER — Encounter (HOSPITAL_COMMUNITY): Payer: Self-pay | Admitting: *Deleted

## 2020-06-01 ENCOUNTER — Telehealth (HOSPITAL_COMMUNITY): Payer: Self-pay | Admitting: *Deleted

## 2020-06-01 DIAGNOSIS — K047 Periapical abscess without sinus: Secondary | ICD-10-CM

## 2020-06-01 DIAGNOSIS — R519 Headache, unspecified: Secondary | ICD-10-CM

## 2020-06-01 DIAGNOSIS — J0100 Acute maxillary sinusitis, unspecified: Secondary | ICD-10-CM

## 2020-06-01 HISTORY — DX: Cerebral infarction, unspecified: I63.9

## 2020-06-01 HISTORY — DX: Cardiac arrest, cause unspecified: I46.9

## 2020-06-01 MED ORDER — CEFTRIAXONE SODIUM 500 MG IJ SOLR
INTRAMUSCULAR | Status: AC
  Filled 2020-06-01: qty 500

## 2020-06-01 MED ORDER — CEFTRIAXONE SODIUM 500 MG IJ SOLR
500.0000 mg | Freq: Once | INTRAMUSCULAR | Status: AC
  Administered 2020-06-01: 500 mg via INTRAMUSCULAR

## 2020-06-01 MED ORDER — AMOXICILLIN-POT CLAVULANATE 875-125 MG PO TABS: 1.0000 | ORAL_TABLET | Freq: Two times a day (BID) | ORAL | 0 refills | Status: DC

## 2020-06-01 MED ORDER — AMOXICILLIN-POT CLAVULANATE 875-125 MG PO TABS: 1.0000 | ORAL_TABLET | Freq: Two times a day (BID) | ORAL | 0 refills | Status: AC

## 2020-06-01 MED ORDER — LIDOCAINE HCL (PF) 1 % IJ SOLN
INTRAMUSCULAR | Status: AC
  Filled 2020-06-01: qty 2

## 2020-06-01 NOTE — ED Provider Notes (Signed)
[place]    CSN: [id number] Arrival date & time: 06/01/20  1742      History   Chief Complaint Chief Complaint  Patient presents with  . Facial Pain    HPI Jason Melendez is a 64 y.o. male.   Reports that he has been having pain to the R side of his face, attributes this to sinuses. Right ear pain, R upper jaw pain, and having a large lump to the roof of his mouth. Reports that he has had an abscess in the gums a few years ago. Has taken tylenol with no relief. Reports that the pain kept him awake last night. Reports that he has taken his BP medication today, has hx CVA. Denies HA, cough, sore throat, nausea, vomiting, diarrhea, rash, fever, other symptoms.  ROS per HPI  The history is provided by the patient.    Past Medical History:  Diagnosis Date  . Cardiopulmonary arrest ([place])   . Diabetes mellitus without complication ([place])   . HTN (hypertension)   . Hyperlipidemia   . Stroke [facility])     Patient Active Problem List   Diagnosis Date Noted  . Chronic pain of lower extremity, bilateral 05/08/2020  . Breast mass in male 11/01/2019  . Syncope 03/30/2018  . Hypertension 12/30/2017  . Chronic combined systolic and diastolic heart failure ([place]) 11/16/2017  . Preventative health care 01/22/2017  . Neurocardiogenic syncope 11/12/2016  . Type 2 diabetes mellitus ([place]) 10/27/2016  . GAD (generalized anxiety disorder) 10/27/2016  . Stenosis of left carotid artery   . History of CVA (cerebrovascular accident) 10/13/2016  . Hypertensive heart disease without CHF 10/13/2016  . HLD (hyperlipidemia) 10/13/2016  . Obesity (BMI 30.0-34.9)     Past Surgical History:  Procedure Laterality Date  . ENDARTERECTOMY Left 10/16/2016   Procedure: ENDARTERECTOMY CAROTID;  Surgeon: Serafina Mitchell, MD;  Location: [facility];  Service: Vascular;  Laterality: Left;  . HERNIA REPAIR    . PATCH ANGIOPLASTY Left 10/16/2016   Procedure: PATCH ANGIOPLASTY USING Rueben Bash  BIOLOGIC PATCH;  Surgeon: Serafina Mitchell, MD;  Location: [facility] OR;  Service: Vascular;  Laterality: Left;       Home Medications    Prior to Admission medications   Medication Sig Start Date End Date Taking? Authorizing Provider  acetaminophen (TYLENOL 8 HOUR ARTHRITIS PAIN) 650 MG CR tablet Take 650 mg by mouth 2 (two) times daily.   Yes [provider]  aspirin 325 MG tablet Take 1 tablet (325 mg total) by mouth daily. 10/19/16  Yes Elgergawy, Silver Huguenin, MD  atorvastatin (LIPITOR) 40 MG tablet TAKE 1 TABLET (40 MG TOTAL) BY MOUTH EVERY EVENING. FOR CHOLESTEROL. 03/18/20  Yes Pleas Koch, NP  carvedilol (COREG) 12.5 MG tablet TAKE 1 TABLET (12.5 MG TOTAL) BY MOUTH 2 (TWO) TIMES DAILY WITH A MEAL. 03/18/20  Yes Burtis Junes, NP  ENTRESTO 49-51 MG TAKE 1 TABLET BY MOUTH TWICE A DAY 03/18/20  Yes Burtis Junes, NP  escitalopram (LEXAPRO) 10 MG tablet Take 1 tablet (10 mg total) by mouth daily. For anxiety. 04/26/19  Yes Pleas Koch, NP  furosemide (LASIX) 20 MG tablet TAKE 1 TABLET BY MOUTH EVERY DAY AS NEEDED FOR FLUID/EDEMA (WEIGHT GAIN 3+ LBS OVERNIGHT) 05/30/19  Yes Burtis Junes, NP  metFORMIN (GLUCOPHAGE) 1000 MG tablet Take 1 tablet (1,000 mg total) by mouth 2 (two) times daily with a meal. For diabetes. 05/09/20  Yes Pleas Koch, NP  spironolactone (  ALDACTONE) 25 MG tablet TAKE 1 TABLET BY MOUTH EVERY DAY 05/14/20  Yes Belva Crome, MD  ACCU-CHEK FASTCLIX LANCETS MISC USE TO CHECK BLOOD SUGARS ONCE DAILY AS DIRECTED 07/01/18   Pleas Koch, NP  ACCU-CHEK GUIDE test strip USE TO CHECK BLOOD SUGARS ONCE DAILY AS DIRECTED 03/05/20   Pleas Koch, NP  amoxicillin-clavulanate (AUGMENTIN) 875-125 MG tablet Take 1 tablet by mouth 2 (two) times daily for 10 days. 06/01/20 06/11/20  Faustino Congress, NP  Blood Glucose Monitoring Suppl (ACCU-CHEK GUIDE ME) w/Device KIT USE TO CHECK BLOOD SUGAR ONCE DAILY AS DIRECTED 05/17/18   Pleas Koch, NP     Family History Family History  Problem Relation Age of Onset  . Diabetes Mother   . Heart disease Father 58       CABG  . Diabetes Father   . Kidney disease Father   . Kidney failure Father   . Hypertension Sister     Social History Social History   Tobacco Use  . Smoking status: Never Smoker  . Smokeless tobacco: Never Used  Vaping Use  . Vaping Use: Never used  Substance Use Topics  . Alcohol use: No  . Drug use: No     Allergies   Hydralazine and Hydralazine hcl   Review of Systems Review of Systems   Physical Exam Triage Vital Signs ED Triage Vitals  Enc Vitals Group     BP 06/01/20 1818 (!) 180/104     Pulse Rate 06/01/20 1818 89     Resp 06/01/20 1818 16     Temp 06/01/20 1818 98.5 F (36.9 C)     Temp Source 06/01/20 1818 Oral     SpO2 06/01/20 1818 95 %     Weight --      Height --      Head Circumference --      Peak Flow --      Pain Score 06/01/20 1820 8     Pain Loc --      Pain Edu? --      Excl. in [place]? --    No data found.  Updated Vital Signs BP (!) 180/104   Pulse 89   Temp 98.5 F (36.9 C) (Oral)   Resp 16   SpO2 95%   Visual Acuity Right Eye Distance:   Left Eye Distance:   Bilateral Distance:    Right Eye Near:   Left Eye Near:    Bilateral Near:     Physical Exam Vitals and nursing note reviewed.  Constitutional:      General: He is not in acute distress.    Appearance: Normal appearance. He is well-developed. He is not ill-appearing.  HENT:     Head: Normocephalic and atraumatic.     Right Ear: Tympanic membrane is erythematous and bulging.     Left Ear: Tympanic membrane normal.     Nose: Nose normal.     Mouth/Throat:     Mouth: Mucous membranes are moist.     Pharynx: Oropharynx is clear. Posterior oropharyngeal erythema present.      Comments: Area of gingival swelling surrounding broken tooth Eyes:     Conjunctiva/sclera: Conjunctivae normal.  Cardiovascular:     Rate and Rhythm: Normal rate and  regular rhythm.     Heart sounds: Normal heart sounds. No murmur heard.   Pulmonary:     Effort: Pulmonary effort is normal. No respiratory distress.     Breath sounds: Normal breath sounds.  Abdominal:     General: Bowel sounds are normal.     Palpations: Abdomen is soft.     Tenderness: There is no abdominal tenderness.  Musculoskeletal:        General: Normal range of motion.     Cervical back: Normal range of motion and neck supple.  Skin:    General: Skin is warm and dry.     Capillary Refill: Capillary refill takes less than 2 seconds.  Neurological:     General: No focal deficit present.     Mental Status: He is alert and oriented to person, place, and time.  Psychiatric:        Mood and Affect: Mood normal.        Behavior: Behavior normal.        Thought Content: Thought content normal.      UC Treatments / Results  Labs (all labs ordered are listed, but only abnormal results are displayed) Labs Reviewed - No data to display  EKG   Radiology No results found.  Procedures Procedures (including critical care time)  Medications Ordered in UC Medications  cefTRIAXone (ROCEPHIN) injection 500 mg (500 mg Intramuscular Given 06/01/20 1854)    Initial Impression / Assessment and Plan / UC Course  I have reviewed the triage vital signs and the nursing notes.  Pertinent labs & imaging results that were available during my care of the patient were reviewed by me and considered in my medical decision making (see chart for details).     Right sided facial pain Dental infection Sinusitis  Presents for burning pain in R sinuses, R upper gum swelling and tenderness Broken tooth noted and gingival swelling marked in diagram above.  Rocephin 559m IM in office today Augmentin prescribed BID x 10 days given extent of infection Follow up with this office or with primary care if symptoms persist Follow up in the ER for acute worsening of symptoms, trouble swallowing,  trouble breathing, other concerning symptmoms Final Clinical Impressions(s) / UC Diagnoses   Final diagnoses:  Right facial pain  Dental infection  Acute non-recurrent maxillary sinusitis     Discharge Instructions     You received an antibiotic injection in the office today  I have sent in Augmentin to your pharmacy for you to take twice a day for 10 days  Follow-up with primary care as needed  Follow-up in the ER for high fever, trouble swallowing, trouble breathing, other concerning symptoms    ED Prescriptions    Medication Sig Dispense Auth. Provider   amoxicillin-clavulanate (AUGMENTIN) 875-125 MG tablet Take 1 tablet by mouth 2 (two) times daily for 10 days. 20 tablet MFaustino Congress NP     PDMP not reviewed this encounter.   MFaustino Congress NP 06/03/20 1200

## 2020-06-01 NOTE — Discharge Instructions (Addendum)
You received an antibiotic injection in the office today  I have sent in Augmentin to your pharmacy for you to take twice a day for 10 days  Follow-up with primary care as needed  Follow-up in the ER for high fever, trouble swallowing, trouble breathing, other concerning symptoms

## 2020-06-01 NOTE — ED Triage Notes (Signed)
C/O starting with uncomfortable burning pain to right sinus area.  Today pain is now extending into right upper gums, right ear, right face and right palate.  States unable to sleep due to  Pain, despite taking Tyl.

## 2020-06-03 ENCOUNTER — Telehealth: Payer: Self-pay

## 2020-06-03 NOTE — Telephone Encounter (Signed)
Pt was seen in UC on 10/2

## 2020-06-03 NOTE — Telephone Encounter (Signed)
[facility] Night - Client TELEPHONE ADVICE RECORD AccessNurse Patient Name: Jason Melendez Gender: Male DOB: 11-12-1955 Age: 64 Y 9 M 15 D Return Phone Number: [phone] (Primary) Address: City/State/Zip: [place] [address] Client [facility] Night - Client Client Site [facility] - Night Physician Vernona Rieger - NP Contact Type Call Who Is Calling Patient / Member / Family / Caregiver Call Type Triage / Clinical Caller Name Burt Ek Relationship To Patient Spouse Return Phone Number [phone] (Primary) Chief Complaint Headache Reason for Call Symptomatic / Request for Health Information Initial Comment Caller states that her husband is having a lot of pain in his face and possibly a sinus infection. Translation No Nurse Assessment Nurse: Lily Kocher, RN, Adriana Date/Time (Eastern Time): 06/01/2020 5:00:37 PM Confirm and document reason for call. If symptomatic, describe symptoms. ---caller states pt has been c/o facial pain. "burns from right nostril, to under his eye and roof of his mouth and right upper gum as well as right ear" sx onset Thursday, but worsening in past few days. burns when he inhales. right nostril feels runny but also congested. no fevers Does the patient have any new or worsening symptoms? ---Yes Will a triage be completed? ---Yes Related visit to physician within the last 2 weeks? ---No Does the PT have any chronic conditions? (i.e. diabetes, asthma, this includes High risk factors for pregnancy, etc.) ---Yes List chronic conditions. ---stroke htn high cholesterol anxiety diabetes Is this a behavioral health or substance abuse call? ---No Guidelines Guideline Title Affirmed Question Affirmed Notes Nurse Date/Time Lamount Cohen Time) Face Pain [1] SEVERE pain (e.g., excruciating) AND [2] not improved after 2 hours of pain medicine Lily Kocher, RN, Adriana 06/01/2020 5:05:05  PM Disp. Time Lamount Cohen Time) Disposition Final User 06/01/2020 5:08:11 PM See HCP within 4 Hours (or PCP triage) Yes Lily Kocher, RN, AdrianaPLEASE NOTE: All timestamps contained within this report are represented as [place] Standard Time. CONFIDENTIALTY NOTICE: This fax transmission is intended only for the addressee. It contains information that is legally privileged, confidential or otherwise protected from use or disclosure. If you are not the intended recipient, you are strictly prohibited from reviewing, disclosing, copying using or disseminating any of this information or taking any action in reliance on or regarding this information. If you have received this fax in error, please notify us immediately by telephone so that we can arrange for its return to [place]. Phone: [phone], Toll-Free: [phone], Fax: [phone] Page: 2 of 2 Call Id: [id number] Caller Disagree/Comply Comply Caller Understands Yes PreDisposition Call Doctor Care Advice Given Per Guideline SEE HCP (OR PCP TRIAGE) WITHIN 4 HOURS: CALL BACK IF: * You become worse CARE ADVICE given per Face Pain (Adult) guideline. * IF OFFICE WILL BE CLOSED AND NO PCP (PRIMARY CARE PROVIDER) SECOND-LEVEL TRIAGE: You need to be seen within the next 3 or 4 hours. A nearby Urgent Care Center [facility]) is often a good source of care. Another choice is to go to the ED. Go sooner if you become worse. * UCC: Some UCCs can manage patients who are stable and have less serious symptoms (e.g., minor illnesses and injuries). The triager must know the [facility] capabilities before sending a patient there. If unsure, call ahead. Referrals [facility]

## 2020-06-03 NOTE — Telephone Encounter (Signed)
Note reviewed from UC.

## 2020-07-14 ENCOUNTER — Other Ambulatory Visit: Payer: Self-pay | Admitting: Primary Care

## 2020-07-14 DIAGNOSIS — F411 Generalized anxiety disorder: Secondary | ICD-10-CM

## 2020-07-24 ENCOUNTER — Other Ambulatory Visit: Payer: Self-pay | Admitting: Primary Care

## 2020-07-24 DIAGNOSIS — E119 Type 2 diabetes mellitus without complications: Secondary | ICD-10-CM

## 2020-07-29 MED ORDER — FUROSEMIDE 20 MG PO TABS: ORAL_TABLET | ORAL | 2 refills | Status: DC

## 2020-09-08 ENCOUNTER — Other Ambulatory Visit: Payer: Self-pay | Admitting: Primary Care

## 2020-09-08 DIAGNOSIS — E7849 Other hyperlipidemia: Secondary | ICD-10-CM

## 2021-03-07 ENCOUNTER — Other Ambulatory Visit: Payer: Self-pay

## 2021-03-07 MED ORDER — ENTRESTO 49-51 MG PO TABS: 1.0000 | ORAL_TABLET | Freq: Two times a day (BID) | ORAL | 0 refills | Status: DC

## 2021-03-07 MED ORDER — CARVEDILOL 12.5 MG PO TABS: 12.5000 mg | ORAL_TABLET | Freq: Two times a day (BID) | ORAL | 0 refills | Status: DC

## 2021-03-16 ENCOUNTER — Other Ambulatory Visit: Payer: Self-pay | Admitting: Interventional Cardiology

## 2021-03-16 ENCOUNTER — Other Ambulatory Visit: Payer: Self-pay | Admitting: Primary Care

## 2021-03-16 DIAGNOSIS — E119 Type 2 diabetes mellitus without complications: Secondary | ICD-10-CM

## 2021-04-11 ENCOUNTER — Other Ambulatory Visit: Payer: Self-pay | Admitting: Primary Care

## 2021-04-11 ENCOUNTER — Other Ambulatory Visit: Payer: Self-pay | Admitting: Interventional Cardiology

## 2021-04-11 DIAGNOSIS — E7849 Other hyperlipidemia: Secondary | ICD-10-CM

## 2021-05-02 ENCOUNTER — Other Ambulatory Visit: Payer: BC Managed Care – PPO

## 2021-05-09 ENCOUNTER — Encounter: Payer: BC Managed Care – PPO | Admitting: Primary Care

## 2021-05-11 ENCOUNTER — Other Ambulatory Visit: Payer: Self-pay | Admitting: Primary Care

## 2021-05-11 ENCOUNTER — Other Ambulatory Visit: Payer: Self-pay | Admitting: Interventional Cardiology

## 2021-05-11 DIAGNOSIS — E7849 Other hyperlipidemia: Secondary | ICD-10-CM

## 2021-05-26 ENCOUNTER — Other Ambulatory Visit: Payer: Self-pay | Admitting: Interventional Cardiology

## 2021-05-29 ENCOUNTER — Encounter: Payer: Self-pay | Admitting: Primary Care

## 2021-06-07 ENCOUNTER — Other Ambulatory Visit: Payer: Self-pay | Admitting: Interventional Cardiology

## 2021-06-09 ENCOUNTER — Other Ambulatory Visit: Payer: Self-pay | Admitting: Primary Care

## 2021-06-09 DIAGNOSIS — F411 Generalized anxiety disorder: Secondary | ICD-10-CM

## 2021-06-09 DIAGNOSIS — E7849 Other hyperlipidemia: Secondary | ICD-10-CM

## 2021-06-09 MED ORDER — ENTRESTO 49-51 MG PO TABS: 1.0000 | ORAL_TABLET | Freq: Two times a day (BID) | ORAL | 0 refills | Status: DC

## 2021-07-05 ENCOUNTER — Other Ambulatory Visit: Payer: Self-pay | Admitting: Interventional Cardiology

## 2021-07-08 ENCOUNTER — Other Ambulatory Visit: Payer: Self-pay | Admitting: Primary Care

## 2021-07-08 DIAGNOSIS — E7849 Other hyperlipidemia: Secondary | ICD-10-CM

## 2021-07-09 ENCOUNTER — Other Ambulatory Visit: Payer: Self-pay | Admitting: Primary Care

## 2021-07-09 DIAGNOSIS — F411 Generalized anxiety disorder: Secondary | ICD-10-CM

## 2021-07-11 ENCOUNTER — Encounter: Payer: Self-pay | Admitting: Primary Care

## 2021-07-18 ENCOUNTER — Telehealth: Payer: Self-pay | Admitting: Primary Care

## 2021-07-18 ENCOUNTER — Other Ambulatory Visit: Payer: Self-pay

## 2021-07-18 ENCOUNTER — Ambulatory Visit (INDEPENDENT_AMBULATORY_CARE_PROVIDER_SITE_OTHER): Payer: 59 | Admitting: Primary Care

## 2021-07-18 ENCOUNTER — Encounter: Payer: Self-pay | Admitting: Primary Care

## 2021-07-18 VITALS — BP 158/80 | HR 86 | Temp 97.6°F | Ht 73.0 in | Wt 279.0 lb

## 2021-07-18 DIAGNOSIS — Z23 Encounter for immunization: Secondary | ICD-10-CM | POA: Diagnosis not present

## 2021-07-18 DIAGNOSIS — Z125 Encounter for screening for malignant neoplasm of prostate: Secondary | ICD-10-CM

## 2021-07-18 DIAGNOSIS — F411 Generalized anxiety disorder: Secondary | ICD-10-CM | POA: Diagnosis not present

## 2021-07-18 DIAGNOSIS — Z0000 Encounter for general adult medical examination without abnormal findings: Secondary | ICD-10-CM

## 2021-07-18 DIAGNOSIS — E1165 Type 2 diabetes mellitus with hyperglycemia: Secondary | ICD-10-CM | POA: Diagnosis not present

## 2021-07-18 DIAGNOSIS — I10 Essential (primary) hypertension: Secondary | ICD-10-CM

## 2021-07-18 DIAGNOSIS — E7849 Other hyperlipidemia: Secondary | ICD-10-CM

## 2021-07-18 DIAGNOSIS — Z8673 Personal history of transient ischemic attack (TIA), and cerebral infarction without residual deficits: Secondary | ICD-10-CM | POA: Diagnosis not present

## 2021-07-18 DIAGNOSIS — G8929 Other chronic pain: Secondary | ICD-10-CM | POA: Insufficient documentation

## 2021-07-18 DIAGNOSIS — M25562 Pain in left knee: Secondary | ICD-10-CM

## 2021-07-18 DIAGNOSIS — I5042 Chronic combined systolic (congestive) and diastolic (congestive) heart failure: Secondary | ICD-10-CM

## 2021-07-18 NOTE — Assessment & Plan Note (Signed)
Doing well on Lexapro 10 mg daily, continue same.

## 2021-07-18 NOTE — Assessment & Plan Note (Signed)
Has not been in for diabetes follow up despite recommendations.   Repeat A1C pending. Continue metformin 1000 mg BID.  Foot exam today.  Managed on statin and ARB.  Pneumonia vaccine UTD.  Follow up in 6 months, discussed this today.

## 2021-07-18 NOTE — Assessment & Plan Note (Signed)
Appears euvolemic today. Follows with cardiology.  Continue Entresto, carvedilol 12.5 mg BID, spironolactone 25 mg.

## 2021-07-18 NOTE — Patient Instructions (Addendum)
Stop by the lab prior to leaving today. I will notify you of your results once received.   Call Dr. Rebecka Apley office to schedule an appointment.  Your blood pressure is too high. Work on weight loss, lower salt diet, exercise.   Start monitoring your blood pressure daily, around the same time of day, for the next 2 weeks.  Ensure that you have rested for 30 minutes prior to checking your blood pressure. Record your readings and send them on MyChart.  It was a pleasure to see you today!  Preventive Care 65-79 Years Old, Male Preventive care refers to lifestyle choices and visits with your health care provider that can promote health and wellness. Preventive care visits are also called wellness exams. What can I expect for my preventive care visit? Counseling During your preventive care visit, your health care provider may ask about your: Medical history, including: Past medical problems. Family medical history. Current health, including: Emotional well-being. Home life and relationship well-being. Sexual activity. Lifestyle, including: Alcohol, nicotine or tobacco, and drug use. Access to firearms. Diet, exercise, and sleep habits. Safety issues such as seatbelt and bike helmet use. Sunscreen use. Work and work Astronomer. Physical exam Your health care provider will check your: Height and weight. These may be used to calculate your BMI (body mass index). BMI is a measurement that tells if you are at a healthy weight. Waist circumference. This measures the distance around your waistline. This measurement also tells if you are at a healthy weight and may help predict your risk of certain diseases, such as type 2 diabetes and high blood pressure. Heart rate and blood pressure. Body temperature. Skin for abnormal spots. What immunizations do I need? Vaccines are usually given at various ages, according to a schedule. Your health care provider will recommend vaccines for you based on  your age, medical history, and lifestyle or other factors, such as travel or where you work. What tests do I need? Screening Your health care provider may recommend screening tests for certain conditions. This may include: Lipid and cholesterol levels. Diabetes screening. This is done by checking your blood sugar (glucose) after you have not eaten for a while (fasting). Hepatitis B test. Hepatitis C test. HIV (human immunodeficiency virus) test. STI (sexually transmitted infection) testing, if you are at risk. Lung cancer screening. Prostate cancer screening. Colorectal cancer screening. Talk with your health care provider about your test results, treatment options, and if necessary, the need for more tests. Follow these instructions at home: Eating and drinking  Eat a diet that includes fresh fruits and vegetables, whole grains, lean protein, and low-fat dairy products. Take vitamin and mineral supplements as recommended by your health care provider. Do not drink alcohol if your health care provider tells you not to drink. If you drink alcohol: Limit how much you have to 0-2 drinks a day. Know how much alcohol is in your drink. In the [place] one drink equals one 12 oz bottle of beer (355 mL), one 5 oz glass of wine (148 mL), or one 1 oz glass of hard liquor (44 mL). Lifestyle Brush your teeth every morning and night with fluoride toothpaste. Floss one time each day. Exercise for at least 30 minutes 5 or more days each week. Do not use any products that contain nicotine or tobacco. These products include cigarettes, chewing tobacco, and vaping devices, such as e-cigarettes. If you need help quitting, ask your health care provider. Do not use drugs. If you are sexually  active, practice safe sex. Use a condom or other form of protection to prevent STIs. Take aspirin only as told by your health care provider. Make sure that you understand how much to take and what form to take. Work with  your health care provider to find out whether it is safe and beneficial for you to take aspirin daily. Find healthy ways to manage stress, such as: Meditation, yoga, or listening to music. Journaling. Talking to a trusted person. Spending time with friends and family. Minimize exposure to UV radiation to reduce your risk of skin cancer. Safety Always wear your seat belt while driving or riding in a vehicle. Do not drive: If you have been drinking alcohol. Do not ride with someone who has been drinking. When you are tired or distracted. While texting. If you have been using any mind-altering substances or drugs. Wear a helmet and other protective equipment during sports activities. If you have firearms in your house, make sure you follow all gun safety procedures. What's next? Go to your health care provider once a year for an annual wellness visit. Ask your health care provider how often you should have your eyes and teeth checked. Stay up to date on all vaccines. This information is not intended to replace advice given to you by your health care provider. Make sure you discuss any questions you have with your health care provider. Document Revised: 02/12/2021 Document Reviewed: 02/12/2021 Elsevier Patient Education  2022 [employer].

## 2021-07-18 NOTE — Telephone Encounter (Signed)
[name] stated that he found the closest appt which is 12/15 @850 

## 2021-07-18 NOTE — Progress Notes (Signed)
Subjective:    Patient ID: Jason Melendez, male    DOB: 30-Nov-1955, 65 y.o.   MRN: [id number]  HPI  Jason Melendez is a very pleasant 65 y.o. male who presents today for complete physical and follow up of chronic conditions.  Immunizations: -Tetanus: 2016 -Influenza: Due -Covid-19: 4 vaccines -Shingles: Shingrix complete -Pneumonia:  2019  Diet: Fair diet.  Exercise: No regular exercise.  Eye exam: Completes annually  Dental exam: Completes semi-annually   Colonoscopy: Completed Cologuard in 2019, due PSA: Due  BP Readings from Last 3 Encounters:  07/18/21 (!) 158/80  06/01/20 (!) 180/104  05/08/20 140/76    He is not checking his blood pressure. He has not seen cardiology in over one year. He endorses compliance to his carvedilol 12.5 mg BID, Entresto 49-51 twice daily, furosemide 10 mg daily, spironolactone 25 mg daily.    Wt Readings from Last 3 Encounters:  07/18/21 279 lb (126.6 kg)  05/08/20 272 lb (123.4 kg)  03/19/20 266 lb (120.7 kg)     Review of Systems  Constitutional:  Negative for unexpected weight change.  HENT:  Negative for rhinorrhea.   Respiratory:  Negative for cough and shortness of breath.   Cardiovascular:  Negative for chest pain.  Gastrointestinal:  Negative for constipation and diarrhea.  Genitourinary:  Negative for difficulty urinating.  Musculoskeletal:  Positive for arthralgias.       Chronic knee pain  Skin:  Negative for rash.  Allergic/Immunologic: Negative for environmental allergies.  Neurological:  Negative for dizziness and headaches.  Psychiatric/Behavioral:  The patient is not nervous/anxious.         Past Medical History:  Diagnosis Date   Cardiopulmonary arrest (HCC)    Diabetes mellitus without complication (HCC)    HTN (hypertension)    Hyperlipidemia    Stroke [facility])     Social History   Socioeconomic History   Marital status: Married    Spouse name: Not on file   Number of children: Not  on file   Years of education: Not on file   Highest education level: Not on file  Occupational History   Not on file  Tobacco Use   Smoking status: Never   Smokeless tobacco: Never  Vaping Use   Vaping Use: Never used  Substance and Sexual Activity   Alcohol use: No   Drug use: No   Sexual activity: Not on file  Other Topics Concern   Not on file  Social History Narrative   Marital status: married      Children: 2 daughters      Lives:  With wife      Employment: printing work      Tobacco: none      Alcohol: one beer every three months      Drugs:  None      Exercise:  No formal exercise; walks up three flights of stairs several times per day at work.   Lives at home.  Works for [employer].     2 yrs college.     Social Determinants of Health   Financial Resource Strain: Not on file  Food Insecurity: Not on file  Transportation Needs: Not on file  Physical Activity: Not on file  Stress: Not on file  Social Connections: Not on file  Intimate Partner Violence: Not on file    Past Surgical History:  Procedure Laterality Date   ENDARTERECTOMY Left 10/16/2016   Procedure: ENDARTERECTOMY CAROTID;  Surgeon:  Nada Libman, MD;  Location: [facility] OR;  Service: Vascular;  Laterality: Left;   HERNIA REPAIR     PATCH ANGIOPLASTY Left 10/16/2016   Procedure: PATCH ANGIOPLASTY USING Livia Snellen BIOLOGIC PATCH;  Surgeon: Nada Libman, MD;  Location: [facility] OR;  Service: Vascular;  Laterality: Left;    Family History  Problem Relation Age of Onset   Diabetes Mother    Heart disease Father 29       CABG   Diabetes Father    Kidney disease Father    Kidney failure Father    Hypertension Sister     Allergies  Allergen Reactions   Hydralazine Anaphylaxis   Hydralazine Hcl     Sudden drop in BP    Current Outpatient Medications on File Prior to Visit  Medication Sig Dispense Refill   ACCU-CHEK FASTCLIX LANCETS MISC USE TO CHECK BLOOD SUGARS ONCE DAILY AS DIRECTED  102 each 5   ACCU-CHEK GUIDE test strip USE TO CHECK BLOOD SUGARS ONCE DAILY AS DIRECTED 100 strip 5   acetaminophen (TYLENOL) 650 MG CR tablet Take 650 mg by mouth 2 (two) times daily.     aspirin 325 MG tablet Take 1 tablet (325 mg total) by mouth daily. 60 tablet 0   atorvastatin (LIPITOR) 40 MG tablet TAKE 1 TABLET BY MOUTH DAILY. FOR CHOLESTEROL. OFFICE VISIT REQUIRED FOR FURTHER REFILLS. 30 tablet 0   carvedilol (COREG) 12.5 MG tablet Take 1 tablet (12.5 mg total) by mouth 2 (two) times daily with a meal. Please make overdue appt with provider before further refills - 2nd attempt 30 tablet 0   ENTRESTO 49-51 MG TAKE 1 TABLET 2 TIMES DAILY. PLEASE MAKE OVERDUE APPT WITH DR. Katrinka Blazing BEFORE ANYMORE REFILLS. 30 tablet 0   escitalopram (LEXAPRO) 10 MG tablet Take 1 tablet (10 mg total) by mouth daily. For anxiety. Office visit required for further refills. 30 tablet 0   furosemide (LASIX) 20 MG tablet TAKE 1 TABLET BY MOUTH EVERY DAY AS NEEDED FOR FLUID/EDEMA (WEIGHT GAIN 3+ LBS OVERNIGHT)-Pt needs to make appt with provider before anymore refills.-1st attempt 90 tablet 0   metFORMIN (GLUCOPHAGE) 1000 MG tablet TAKE 1 TABLET (1,000 MG TOTAL) BY MOUTH 2 (TWO) TIMES DAILY WITH A MEAL. FOR DIABETES. 180 tablet 0   spironolactone (ALDACTONE) 25 MG tablet TAKE 1 TABLET BY MOUTH EVERY DAY 90 tablet 3   No current facility-administered medications on file prior to visit.    BP (!) 158/80   Pulse 86   Temp 97.6 F (36.4 C) (Temporal)   Ht 6\' 1"  (1.854 m)   Wt 279 lb (126.6 kg)   SpO2 97%   BMI 36.81 kg/m  Objective:   Physical Exam HENT:     Right Ear: Tympanic membrane and ear canal normal.     Left Ear: Tympanic membrane and ear canal normal.     Nose: Nose normal.     Right Sinus: No maxillary sinus tenderness or frontal sinus tenderness.     Left Sinus: No maxillary sinus tenderness or frontal sinus tenderness.  Eyes:     Conjunctiva/sclera: Conjunctivae normal.  Neck:     Thyroid: No  thyromegaly.     Vascular: No carotid bruit.  Cardiovascular:     Rate and Rhythm: Normal rate and regular rhythm.     Heart sounds: Normal heart sounds.  Pulmonary:     Effort: Pulmonary effort is normal.     Breath sounds: Normal breath sounds. No wheezing or rales.  Abdominal:     General: Bowel sounds are normal.     Palpations: Abdomen is soft.     Tenderness: There is no abdominal tenderness.  Musculoskeletal:     Cervical back: Neck supple.     Left knee: No swelling, erythema or bony tenderness. Decreased range of motion.       Legs:  Skin:    General: Skin is warm and dry.  Neurological:     Mental Status: He is alert and oriented to person, place, and time.     Cranial Nerves: No cranial nerve deficit.     Deep Tendon Reflexes: Reflexes are normal and symmetric.  Psychiatric:        Mood and Affect: Mood normal.          Assessment & Plan:      This visit occurred during the SARS-CoV-2 public health emergency.  Safety protocols were in place, including screening questions prior to the visit, additional usage of staff PPE, and extensive cleaning of exam room while observing appropriate contact time as indicated for disinfecting solutions.

## 2021-07-18 NOTE — Assessment & Plan Note (Addendum)
Uncontrolled in the office, even on recheck.  He endorses eating 2 large BBQ sandwiches 2 hours ago, thinks this contributed.  He endorses compliance to all BP medications. Weight gain over the last year, discussed to work on weight loss.  Advised he start monitoring BP at home and follow up with cardiology soon. If he cannot get in within 2 weeks he will send BP readings to me via MyChart.   Continue carvedilol 12.5 mg BID, Entresto 49-51, furosemide 10 mg, spironolactone 25 mg.

## 2021-07-18 NOTE — Assessment & Plan Note (Signed)
No new symptoms, exam today stable.  Continue control of lipids, diabetes, BP.  Labs pending.

## 2021-07-18 NOTE — Assessment & Plan Note (Signed)
Chronic for the last year, patellar injury last year.  Offered orthopedic evaluation for which he kindly declines.  He will update when ready.

## 2021-07-18 NOTE — Telephone Encounter (Signed)
Please thank you for the information.  Have him send me some blood pressure readings in 2 weeks via MyChart.

## 2021-07-18 NOTE — Assessment & Plan Note (Signed)
Influenza vaccine provided today. Other vaccines UTD. PSA due and pending. Colon caner screening due, he opts for Cologuard and declines colonoscopy. Will send Cologuard.  Discussed the importance of a healthy diet and regular exercise in order for weight loss, and to reduce the risk of further co-morbidity.  Exam today stable.  Labs pending.

## 2021-07-19 LAB — COMPREHENSIVE METABOLIC PANEL
AG Ratio: 1.6 (calc) (ref 1.0–2.5)
ALT: 11 U/L (ref 9–46)
AST: 16 U/L (ref 10–35)
Albumin: 4.1 g/dL (ref 3.6–5.1)
Alkaline phosphatase (APISO): 31 U/L — ABNORMAL LOW (ref 35–144)
BUN: 17 mg/dL (ref 7–25)
CO2: 28 mmol/L (ref 20–32)
Calcium: 9.9 mg/dL (ref 8.6–10.3)
Chloride: 105 mmol/L (ref 98–110)
Creat: 1.1 mg/dL (ref 0.70–1.35)
Globulin: 2.5 g/dL (calc) (ref 1.9–3.7)
Glucose, Bld: 176 mg/dL — ABNORMAL HIGH (ref 65–99)
Potassium: 4.2 mmol/L (ref 3.5–5.3)
Sodium: 143 mmol/L (ref 135–146)
Total Bilirubin: 0.5 mg/dL (ref 0.2–1.2)
Total Protein: 6.6 g/dL (ref 6.1–8.1)

## 2021-07-19 LAB — CBC
HCT: 38.5 % (ref 38.5–50.0)
Hemoglobin: 12.8 g/dL — ABNORMAL LOW (ref 13.2–17.1)
MCH: 31.4 pg (ref 27.0–33.0)
MCHC: 33.2 g/dL (ref 32.0–36.0)
MCV: 94.6 fL (ref 80.0–100.0)
MPV: 12.1 fL (ref 7.5–12.5)
Platelets: 176 10*3/uL (ref 140–400)
RBC: 4.07 10*6/uL — ABNORMAL LOW (ref 4.20–5.80)
RDW: 12.5 % (ref 11.0–15.0)
WBC: 5.6 10*3/uL (ref 3.8–10.8)

## 2021-07-19 LAB — LIPID PANEL
Cholesterol: 119 mg/dL (ref <200)
HDL: 25 mg/dL — ABNORMAL LOW (ref >=40)
LDL Cholesterol (Calc): 64 mg/dL (calc)
Non-HDL Cholesterol (Calc): 94 mg/dL (calc) (ref <130)
Total CHOL/HDL Ratio: 4.8 (calc) (ref <5.0)
Triglycerides: 258 mg/dL — ABNORMAL HIGH (ref <150)

## 2021-07-19 LAB — HEMOGLOBIN A1C
Hgb A1c MFr Bld: 6.6 % of total Hgb — ABNORMAL HIGH (ref <5.7)
Mean Plasma Glucose: 143 mg/dL
eAG (mmol/L): 7.9 mmol/L

## 2021-07-19 LAB — PSA: PSA: 0.53 ng/mL (ref <=4.00)

## 2021-07-21 NOTE — Telephone Encounter (Signed)
Called patient reviewed all information and repeated back to me. Will call if any questions.  ? ?

## 2021-07-31 ENCOUNTER — Other Ambulatory Visit: Payer: Self-pay | Admitting: Primary Care

## 2021-07-31 DIAGNOSIS — E7849 Other hyperlipidemia: Secondary | ICD-10-CM

## 2021-08-01 LAB — HM DIABETES EYE EXAM

## 2021-08-04 ENCOUNTER — Telehealth: Payer: Self-pay | Admitting: Primary Care

## 2021-08-04 ENCOUNTER — Other Ambulatory Visit: Payer: Self-pay

## 2021-08-04 DIAGNOSIS — F411 Generalized anxiety disorder: Secondary | ICD-10-CM

## 2021-08-04 DIAGNOSIS — E7849 Other hyperlipidemia: Secondary | ICD-10-CM

## 2021-08-04 DIAGNOSIS — E119 Type 2 diabetes mellitus without complications: Secondary | ICD-10-CM

## 2021-08-04 MED ORDER — FUROSEMIDE 20 MG PO TABS: ORAL_TABLET | ORAL | 0 refills | Status: DC

## 2021-08-04 MED ORDER — SPIRONOLACTONE 25 MG PO TABS: 25.0000 mg | ORAL_TABLET | Freq: Every day | ORAL | 0 refills | Status: DC

## 2021-08-04 MED ORDER — CARVEDILOL 12.5 MG PO TABS: 12.5000 mg | ORAL_TABLET | Freq: Two times a day (BID) | ORAL | 0 refills | Status: DC

## 2021-08-04 MED ORDER — ENTRESTO 49-51 MG PO TABS: ORAL_TABLET | ORAL | 0 refills | Status: DC

## 2021-08-04 NOTE — Telephone Encounter (Signed)
Pt's medications were sent to pt's pharmacy as requested. Confirmation received.  

## 2021-08-04 NOTE — Telephone Encounter (Signed)
Pt called stating that [name] can request results of pt eye exam and prescription from [facility] at [facility] with Dr Seymour Bars. Phone # [phone] Fax # [phone].Pt also states that starting December 1st pt will be under medicare with [facility] and to send all his prescription to [facility].

## 2021-08-05 ENCOUNTER — Telehealth: Payer: Self-pay | Admitting: Interventional Cardiology

## 2021-08-05 MED ORDER — ESCITALOPRAM OXALATE 10 MG PO TABS: 10.0000 mg | ORAL_TABLET | Freq: Every day | ORAL | 3 refills | Status: DC

## 2021-08-05 MED ORDER — ATORVASTATIN CALCIUM 40 MG PO TABS: 40.0000 mg | ORAL_TABLET | Freq: Every day | ORAL | 3 refills | Status: DC

## 2021-08-05 MED ORDER — METFORMIN HCL 1000 MG PO TABS: 1000.0000 mg | ORAL_TABLET | Freq: Two times a day (BID) | ORAL | 1 refills | Status: DC

## 2021-08-05 NOTE — Telephone Encounter (Signed)
Patient wanted to know if Dr. Katrinka Blazing wants him to repeat his labs at his upcoming appt with Gillian Shields 08/14/21.  He said he had labs drawn by his PCP 07/18/21 but was not told to fast. He had a fairly large lunch before his labs and it affected the lab results.  Please let the patient know what the office recommends. He can be reached through the MyChart portal if that is easier for the office

## 2021-08-05 NOTE — Telephone Encounter (Signed)
Called patient made appointment on 5/18 to 40 minutes. He has started with medicare and will need scripts for 90 days [facility] in to [facility]

## 2021-08-05 NOTE — Telephone Encounter (Signed)
Spoke with pt and made him aware that he would not need to have labs drawn again.  Advised LDL looks good and trigs were as expected for someone not fasting.  Pt appreciative for call.

## 2021-08-05 NOTE — Telephone Encounter (Signed)
Sent for eye exam. Do you need to do a welcome to medicare?

## 2021-08-05 NOTE — Addendum Note (Signed)
Addended by: Doreene Nest on: 08/05/2021 06:44 PM   Modules accepted: Orders

## 2021-08-05 NOTE — Telephone Encounter (Signed)
I sent the prescriptions that I prescribe to [facility] now His cardiologist prescribes the rest and it looks like he needs to keep his appointment with them for further refills.

## 2021-08-05 NOTE — Telephone Encounter (Signed)
Yes, Welcome to [facility] at some point, maybe in May 2023 as he's already scheduled for DM follow up?  Also, the date says December 1st he will be transitioning. Today is 08/05/21, did he already start medicare and is he needing Rx's sent over to [facility] now?

## 2021-08-06 NOTE — Telephone Encounter (Signed)
Called patient reviewed all information and repeated back to me. Will call if any questions.  ? ?

## 2021-08-10 ENCOUNTER — Other Ambulatory Visit: Payer: Self-pay | Admitting: Interventional Cardiology

## 2021-08-10 ENCOUNTER — Other Ambulatory Visit: Payer: Self-pay | Admitting: Primary Care

## 2021-08-10 ENCOUNTER — Other Ambulatory Visit: Payer: Self-pay | Admitting: Family Medicine

## 2021-08-10 DIAGNOSIS — F411 Generalized anxiety disorder: Secondary | ICD-10-CM

## 2021-08-10 DIAGNOSIS — E119 Type 2 diabetes mellitus without complications: Secondary | ICD-10-CM

## 2021-08-11 ENCOUNTER — Telehealth: Payer: Self-pay | Admitting: Primary Care

## 2021-08-11 DIAGNOSIS — F411 Generalized anxiety disorder: Secondary | ICD-10-CM

## 2021-08-11 NOTE — Telephone Encounter (Signed)
Pt called stating that medication escitalopram (LEXAPRO) 10 MG tablet that was called to [facility] ) - [place], [place] - [address] is not arriving until 12/15 to 12/18. Pt states that he would need sample medication until he's medication comes. Pt states if you could send sample to [id number] - [place], [place] - [address]

## 2021-08-12 MED ORDER — ESCITALOPRAM OXALATE 10 MG PO TABS
10.0000 mg | ORAL_TABLET | Freq: Every day | ORAL | 0 refills | Status: DC
Start: 2021-08-12 — End: 2021-11-25

## 2021-08-12 NOTE — Addendum Note (Signed)
Addended by: Doreene Nest on: 08/12/2021 06:49 AM   Modules accepted: Orders

## 2021-08-12 NOTE — Telephone Encounter (Signed)
Noted, Rx for [facility]apro 10 mg [facility].

## 2021-08-14 ENCOUNTER — Other Ambulatory Visit: Payer: Self-pay

## 2021-08-14 ENCOUNTER — Encounter (HOSPITAL_BASED_OUTPATIENT_CLINIC_OR_DEPARTMENT_OTHER): Payer: Self-pay | Admitting: Family

## 2021-08-14 ENCOUNTER — Other Ambulatory Visit: Payer: Self-pay | Admitting: Primary Care

## 2021-08-14 ENCOUNTER — Ambulatory Visit (HOSPITAL_BASED_OUTPATIENT_CLINIC_OR_DEPARTMENT_OTHER): Payer: 59 | Admitting: Family

## 2021-08-14 VITALS — BP 128/68 | HR 75 | Ht 73.0 in | Wt 278.9 lb

## 2021-08-14 DIAGNOSIS — E782 Mixed hyperlipidemia: Secondary | ICD-10-CM | POA: Diagnosis not present

## 2021-08-14 DIAGNOSIS — I119 Hypertensive heart disease without heart failure: Secondary | ICD-10-CM | POA: Diagnosis not present

## 2021-08-14 DIAGNOSIS — Z8673 Personal history of transient ischemic attack (TIA), and cerebral infarction without residual deficits: Secondary | ICD-10-CM

## 2021-08-14 DIAGNOSIS — I77810 Thoracic aortic ectasia: Secondary | ICD-10-CM

## 2021-08-14 DIAGNOSIS — I5042 Chronic combined systolic (congestive) and diastolic (congestive) heart failure: Secondary | ICD-10-CM

## 2021-08-14 DIAGNOSIS — F411 Generalized anxiety disorder: Secondary | ICD-10-CM

## 2021-08-14 DIAGNOSIS — I739 Peripheral vascular disease, unspecified: Secondary | ICD-10-CM

## 2021-08-14 MED ORDER — SPIRONOLACTONE 25 MG PO TABS: 25.0000 mg | ORAL_TABLET | Freq: Every day | ORAL | 3 refills | Status: DC

## 2021-08-14 MED ORDER — CARVEDILOL 12.5 MG PO TABS: 12.5000 mg | ORAL_TABLET | Freq: Two times a day (BID) | ORAL | 3 refills | Status: DC

## 2021-08-14 MED ORDER — ENTRESTO 49-51 MG PO TABS: ORAL_TABLET | ORAL | 0 refills | Status: DC

## 2021-08-14 MED ORDER — FUROSEMIDE 20 MG PO TABS: 20.0000 mg | ORAL_TABLET | Freq: Every day | ORAL | 3 refills | Status: DC

## 2021-08-14 NOTE — Progress Notes (Signed)
Office Visit    Patient Name: Jason Melendez Date of Encounter: 08/14/2021  PCP:  Doreene Nest, NP   [facility]  Cardiologist:  Lesleigh Noe, MD  Advanced Practice Provider:  No care team member to display Electrophysiologist:  None      Chief Complaint    Jason Melendez is a 65 y.o. male with a hx of left brain stroke, high-grade left carotid stenosis treated with surgery by Dr. Myra Gianotti, systolic heart failure, hypertensive heart disease, hyperlipidemia, vasovagal syncope presents today for follow-up of heart failure  Past Medical History    Past Medical History:  Diagnosis Date   Cardiopulmonary arrest (HCC)    Diabetes mellitus without complication (HCC)    HTN (hypertension)    Hyperlipidemia    Stroke [facility])    Past Surgical History:  Procedure Laterality Date   ENDARTERECTOMY Left 10/16/2016   Procedure: ENDARTERECTOMY CAROTID;  Surgeon: Nada Libman, MD;  Location: [facility] OR;  Service: Vascular;  Laterality: Left;   HERNIA REPAIR     PATCH ANGIOPLASTY Left 10/16/2016   Procedure: PATCH ANGIOPLASTY USING Livia Snellen BIOLOGIC PATCH;  Surgeon: Nada Libman, MD;  Location: [facility] OR;  Service: Vascular;  Laterality: Left;    Allergies  Allergies  Allergen Reactions   Hydralazine Anaphylaxis   Hydralazine Hcl     Sudden drop in BP    History of Present Illness    Jason Melendez is a 65 y.o. male with a hx of  left brain stroke, high-grade left carotid stenosis treated with surgery by Dr. Myra Gianotti, systolic heart failure, hypertensive heart disease, hyperlipidemia, vasovagal syncope  last seen 03/19/2020 by Dr. Katrinka Blazing.  He had previous Myoview February 2018 which was negative.  No prior cardiac catheterization.  He was last seen 02/2020 by Dr. Katrinka Blazing doing well from a cardiac perspective.  Presents today for follow up. BP this morning 158/91. He then took his medication just before 7 am and blood pressure was improved  today. Does note he has a history of white coat hypertension. Encouraged to monitor at home. Reports no shortness of breath nor dyspnea on exertion. Reports no chest pain, pressure, or tightness. No  orthopnea, PND. Reports no palpitations.  Notes LE edema bilaterally with prolonged ambulation which resolves with elevation. He tells me he gets pain in bilateral legs from knees down with ambulation which improves with rest.   EKGs/Labs/Other Studies Reviewed:   The following studies were reviewed today:  2D Doppler echocardiogram 2020 IMPRESSIONS     1. The left ventricle has mild-moderately reduced systolic function, with  an ejection fraction of 40-45%. The cavity size was mildly dilated. Left  ventricular diastolic parameters were normal. Left ventricular diffuse  hypokinesis.   2. The right ventricle has normal systolic function. The cavity was  normal.   3. The mitral valve is grossly normal.   4. The tricuspid valve is grossly normal.   5. The aortic valve is tricuspid. No stenosis of the aortic valve.   6. There is mild dilatation of the ascending aorta measuring 39 mm.   7. Mild to moderate global reduction in LV systolic function; mild LVE;  mildly dilated ascending aorta.   EKG:  EKG is  ordered today.  The ekg ordered today demonstrates NSR 75 bpm with occasional PVC.   Recent Labs: 07/18/2021: ALT 11; BUN 17; Creat 1.10; Hemoglobin 12.8; Platelets 176; Potassium 4.2; Sodium 143  Recent Lipid Panel  Component Value Date/Time   CHOL 119 07/18/2021 1512   CHOL 111 03/22/2020 0723   TRIG 258 (H) 07/18/2021 1512   HDL 25 (L) 07/18/2021 1512   HDL 24 (L) 03/22/2020 0723   CHOLHDL 4.8 07/18/2021 1512   VLDL 41.2 (H) 03/04/2018 0745   LDLCALC 64 07/18/2021 1512   LDLDIRECT 52.0 03/04/2018 0745     Home Medications   No outpatient medications have been marked as taking for the 08/14/21 encounter (Appointment) with Alver Sorrow, NP.     Review of Systems       All other systems reviewed and are otherwise negative except as noted above.  Physical Exam    VS:  There were no vitals taken for this visit. , BMI There is no height or weight on file to calculate BMI.  Wt Readings from Last 3 Encounters:  07/18/21 279 lb (126.6 kg)  05/08/20 272 lb (123.4 kg)  03/19/20 266 lb (120.7 kg)     GEN: Well nourished, well developed, in no acute distress. HEENT: normal. Neck: Supple, no JVD, carotid bruits, or masses. Cardiac: RRR, no murmurs, rubs, or gallops. No clubbing, cyanosis, edema.  Radials/PT 2+ and equal bilaterally.  Respiratory:  Respirations regular and unlabored, clear to auscultation bilaterally. GI: Soft, nontender, nondistended. MS: No deformity or atrophy. Skin: Warm and dry, no rash. Neuro:  Strength and sensation are intact. Psych: Normal affect.  Assessment & Plan    Combined systolic and diastolic heart failure - 01/2019 LVEF 40-45%. Euvolemic and well compensated on exam. GDMT includes Coreg, Entresto, Lasix, Spironolactone. Plan to update echo. As he is transitioning to Medicare if he has normal LVEF consider transition Entresto to Losartan. If LVEF not normalized, consider addition of SGLT2i. Low salt diet and fluid restriction <2L encouraged. Recommend compression stockings for intermittent LE edema. Heart healthy diet and regular cardiovascular exercise encouraged.    Bilateral leg pain / Claudication - Notes bilateral leg pain with ambulation that improves with rest. 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 Concern for claudication symptoms. ABI/LE arterial duplex ordered.   Hypertensive heart disease with heart failure - BP well controlled today. Encouraged to monitor at home. If BP consistently >130/80 he will contact our office.   Hyperlipidemia - 07/18/21 total cholesterol 119, LDL 64. Continue Atorvastatin.   History of CVA - Continue aspirin, statin.   Dilation of  ascending aorta -echo 01/2019 ascending aorta 39 mm. Plan to update echocardiogram for monitoring. Continue optimal BP control.  Prediabetes - 07/18/21 A1c 6.6.  Continue to follow with PCP.   Disposition: Follow up in 6 month(s) with Lesleigh Noe, MD or APP.  Signed, Alver Sorrow, NP 08/14/2021, 8:15 AM [facility]

## 2021-08-14 NOTE — Patient Instructions (Addendum)
Medication Instructions:  Your Physician recommend you continue on your current medication as directed.    *If you need a refill on your cardiac medications before your next appointment, please call your pharmacy*   Lab Work: None ordered today    Testing/Procedures: Your physician has requested that you have an echocardiogram. Echocardiography is a painless test that uses sound waves to create images of your heart. It provides your doctor with information about the size and shape of your heart and how well your hearts chambers and valves are working. This procedure takes approximately one hour. There are no restrictions for this procedure. [id number]  Your physician has requested that you have an ankle brachial index (ABI). During this test an ultrasound and blood pressure cuff are used to evaluate the arteries that supply the arms and legs with blood. Allow thirty minutes for this exam. There are no restrictions or special instructions.    Follow-Up: At [facility], you and your health needs are our priority.  As part of our continuing mission to provide you with exceptional heart care, we have created designated Provider Care Teams.  These Care Teams include your primary Cardiologist (physician) and Advanced Practice Providers (APPs -  Physician Assistants and Nurse Practitioners) who all work together to provide you with the care you need, when you need it.  We recommend signing up for the patient portal called "MyChart".  Sign up information is provided on this After Visit Summary.  MyChart is used to connect with patients for Virtual Visits (Telemedicine).  Patients are able to view lab/test results, encounter notes, upcoming appointments, etc.  Non-urgent messages can be sent to your provider as well.   To learn more about what you can do with MyChart, go to [email or web address].    Your next appointment:   6 month(s)  The format for your next  appointment:   In Person  Provider:   Lesleigh Noe, MD or APP     Other Instructions:  To prevent or reduce lower extremity swelling: Eat a low salt diet. Salt makes the body hold onto extra fluid which causes swelling. Sit with legs elevated. For example, in the recliner or on an ottoman.  Wear knee-high compression stockings during the daytime. Ones labeled 15-20 mmHg provide good compression.  Tips to Measure your Blood Pressure Correctly  To determine whether you have hypertension, a medical professional will take a blood pressure reading. How you prepare for the test, the position of your arm, and other factors can change a blood pressure reading by 10% or more. That could be enough to hide high blood pressure, start you on a drug you don't really need, or lead your doctor to incorrectly adjust your medications.  National and international guidelines offer specific instructions for measuring blood pressure. If a doctor, nurse, or medical assistant isn't doing it right, don't hesitate to ask him or her to get with the guidelines.  Here's what you can do to ensure a correct reading:  Don't drink a caffeinated beverage or smoke during the 30 minutes before the test.  Sit quietly for five minutes before the test begins.  During the measurement, sit in a chair with your feet on the floor and your arm supported so your elbow is at about heart level.  The inflatable part of the cuff should completely cover at least 80% of your upper arm, and the cuff should be placed on bare skin, not over a shirt.  Don't talk during the measurement.  Have your blood pressure measured twice, with a brief break in between. If the readings are different by 5 points or more, have it done a third time.  In 2017, new guidelines from the American Heart Association, the [employer] of Cardiology, and nine other health organizations lowered the diagnosis of high blood pressure to 130/80 mm Hg or higher for  all adults. The guidelines also redefined the various blood pressure categories to now include normal, elevated, Stage 1 hypertension, Stage 2 hypertension, and hypertensive crisis (see "Blood pressure categories").  Blood pressure categories  Blood pressure category SYSTOLIC (upper number)  DIASTOLIC (lower number)  Normal Less than 120 mm Hg and Less than 80 mm Hg  Elevated 120-129 mm Hg and Less than 80 mm Hg  High blood pressure: Stage 1 hypertension 130-139 mm Hg or 80-89 mm Hg  High blood pressure: Stage 2 hypertension 140 mm Hg or higher or 90 mm Hg or higher  Hypertensive crisis (consult your doctor immediately) Higher than 180 mm Hg and/or Higher than 120 mm Hg  Source: American Heart Association and American Stroke Association. For more on getting your blood pressure under control, buy Controlling Your Blood Pressure, a Special Health Report from [facility].   Blood Pressure Log   Date   Time  Blood Pressure  Position  Example: Nov 1 9 AM 124/78 sitting

## 2021-08-25 ENCOUNTER — Encounter: Payer: Self-pay | Admitting: Interventional Cardiology

## 2021-08-27 ENCOUNTER — Other Ambulatory Visit: Payer: Self-pay | Admitting: Interventional Cardiology

## 2021-08-27 DIAGNOSIS — E782 Mixed hyperlipidemia: Secondary | ICD-10-CM

## 2021-09-08 ENCOUNTER — Ambulatory Visit (INDEPENDENT_AMBULATORY_CARE_PROVIDER_SITE_OTHER): Payer: HMO

## 2021-09-08 ENCOUNTER — Telehealth (HOSPITAL_BASED_OUTPATIENT_CLINIC_OR_DEPARTMENT_OTHER): Payer: Self-pay

## 2021-09-08 ENCOUNTER — Other Ambulatory Visit: Payer: Self-pay

## 2021-09-08 DIAGNOSIS — I739 Peripheral vascular disease, unspecified: Secondary | ICD-10-CM

## 2021-09-08 DIAGNOSIS — I5042 Chronic combined systolic (congestive) and diastolic (congestive) heart failure: Secondary | ICD-10-CM | POA: Diagnosis not present

## 2021-09-08 DIAGNOSIS — E782 Mixed hyperlipidemia: Secondary | ICD-10-CM

## 2021-09-08 LAB — ECHOCARDIOGRAM COMPLETE
AR max vel: 2.71 cm2
AV Area VTI: 2.52 cm2
AV Area mean vel: 2.59 cm2
AV Mean grad: 3 mmHg
AV Peak grad: 5.8 mmHg
Ao pk vel: 1.2 m/s
Area-P 1/2: 4.63 cm2
Calc EF: 44.8 %
S' Lateral: 4.34 cm
Single Plane A2C EF: 52 %
Single Plane A4C EF: 43 %

## 2021-09-08 MED ORDER — PERFLUTREN LIPID MICROSPHERE
1.0000 mL | INTRAVENOUS | Status: AC | PRN
  Administered 2021-09-08: 2 mL via INTRAVENOUS

## 2021-09-08 MED ORDER — DAPAGLIFLOZIN PROPANEDIOL 10 MG PO TABS: 10.0000 mg | ORAL_TABLET | Freq: Every day | ORAL | 3 refills | Status: DC

## 2021-09-08 NOTE — Telephone Encounter (Signed)
Called patient to verify results and inform him of medication change. Patient is agreeable. Medication ordered to [facility] and lab slips placed in the mail. Patient will return for lab work 2 weeks after starting new medication!

## 2021-09-08 NOTE — Telephone Encounter (Signed)
-----   Message from Alver Sorrow, NP sent at 09/08/2021  1:36 PM EST ----- Echocardiogram shows mildly reduced heart pumping function. Mild thickening and stiffness of heart muscle. Aorta mildly dilated. Overall stable compared to prior echo.  Recommend continuing Entresto at present dose. Please send refill to [facility]. For optimization of HF therapy, recommend starting Farxiga 10mg  QD. Repeat BMP 2 weeks after starting.

## 2021-09-09 NOTE — Progress Notes (Signed)
Seen by patient Jason Melendez on 09/09/2021  8:37 AM

## 2021-09-17 ENCOUNTER — Ambulatory Visit: Payer: 59 | Admitting: Physician Assistant

## 2021-09-20 ENCOUNTER — Encounter (HOSPITAL_BASED_OUTPATIENT_CLINIC_OR_DEPARTMENT_OTHER): Payer: Self-pay

## 2021-09-23 ENCOUNTER — Telehealth: Payer: Self-pay | Admitting: Interventional Cardiology

## 2021-09-23 ENCOUNTER — Other Ambulatory Visit: Payer: Self-pay

## 2021-09-23 MED ORDER — DAPAGLIFLOZIN PROPANEDIOL 10 MG PO TABS: 10.0000 mg | ORAL_TABLET | Freq: Every day | ORAL | 3 refills | Status: DC

## 2021-09-23 NOTE — Telephone Encounter (Signed)
°*  STAT* If patient is at the pharmacy, call can be transferred to refill team.   1. Which medications need to be refilled? (please list name of each medication and dose if known) dapagliflozin propanediol (FARXIGA) 10 MG TABS tablet  2. Which pharmacy/location (including street and city if local pharmacy) is medication to be sent to? [facility] ([place] - [place], [place] - [address]  3. Do they need a 30 day or 90 day supply? 90   Medication needs to be sent to the above pharmacy

## 2021-10-14 LAB — BASIC METABOLIC PANEL
BUN/Creatinine Ratio: 14 (ref 10–24)
BUN: 16 mg/dL (ref 8–27)
CO2: 23 mmol/L (ref 20–29)
Calcium: 9.8 mg/dL (ref 8.6–10.2)
Chloride: 108 mmol/L — ABNORMAL HIGH (ref 96–106)
Creatinine, Ser: 1.16 mg/dL (ref 0.76–1.27)
Glucose: 134 mg/dL — ABNORMAL HIGH (ref 70–99)
Potassium: 4.7 mmol/L (ref 3.5–5.2)
Sodium: 148 mmol/L — ABNORMAL HIGH (ref 134–144)
eGFR: 70 mL/min/{1.73_m2} (ref >59)

## 2021-10-18 ENCOUNTER — Encounter (HOSPITAL_BASED_OUTPATIENT_CLINIC_OR_DEPARTMENT_OTHER): Payer: Self-pay

## 2021-10-18 DIAGNOSIS — E782 Mixed hyperlipidemia: Secondary | ICD-10-CM

## 2021-10-20 ENCOUNTER — Telehealth (HOSPITAL_BASED_OUTPATIENT_CLINIC_OR_DEPARTMENT_OTHER): Payer: Self-pay

## 2021-10-20 DIAGNOSIS — E782 Mixed hyperlipidemia: Secondary | ICD-10-CM

## 2021-10-20 MED ORDER — ENTRESTO 49-51 MG PO TABS: ORAL_TABLET | ORAL | 3 refills | Status: DC

## 2021-10-20 NOTE — Telephone Encounter (Signed)
Refill sent to requested pharmacy.

## 2021-10-20 NOTE — Telephone Encounter (Signed)
Can we please call to ensure he has enough Entresto to wait on mail order? Otherwise may need to send 30 days to local pharmacy and 90 days to [facility]).   Also - [facility] so [facility] need [facility] to [facility].   Thanks! Alver Sorrow, NP

## 2021-10-29 ENCOUNTER — Other Ambulatory Visit: Payer: Self-pay | Admitting: Interventional Cardiology

## 2021-10-29 DIAGNOSIS — I5042 Chronic combined systolic (congestive) and diastolic (congestive) heart failure: Secondary | ICD-10-CM

## 2021-10-31 ENCOUNTER — Encounter: Payer: Self-pay | Admitting: Primary Care

## 2021-11-01 ENCOUNTER — Other Ambulatory Visit: Payer: Self-pay | Admitting: Nurse Practitioner

## 2021-11-01 ENCOUNTER — Telehealth: Payer: HMO | Admitting: Emergency Medicine

## 2021-11-01 DIAGNOSIS — U071 COVID-19: Secondary | ICD-10-CM | POA: Diagnosis not present

## 2021-11-01 MED ORDER — NIRMATRELVIR/RITONAVIR (PAXLOVID)TABLET: 3.0000 | ORAL_TABLET | Freq: Two times a day (BID) | ORAL | 0 refills | Status: DC

## 2021-11-01 MED ORDER — NIRMATRELVIR/RITONAVIR (PAXLOVID)TABLET: 3.0000 | ORAL_TABLET | Freq: Two times a day (BID) | ORAL | 0 refills | Status: AC

## 2021-11-01 NOTE — Patient Instructions (Signed)
?Jason Melendez, thank you for joining Rennis Harding, PA-C for today's virtual visit.  While this provider is not your primary care provider (PCP), if your PCP is located in our provider database this encounter information will be shared with them immediately following your visit. ? ?Consent: ?(Patient) Jason Melendez provided verbal consent for this virtual visit at the beginning of the encounter. ? ?Current Medications: ? ?Current Outpatient Medications:  ?  nirmatrelvir/ritonavir EUA (PAXLOVID) 20 x 150 MG & 10 x 100MG  TABS, Take 3 tablets by mouth 2 (two) times daily for 5 days. (Take nirmatrelvir 150 mg two tablets twice daily for 5 days and ritonavir 100 mg one tablet twice daily for 5 days) Patient GFR is 70, Disp: 30 tablet, Rfl: 0 ?  ACCU-CHEK FASTCLIX LANCETS MISC, USE TO CHECK BLOOD SUGARS ONCE DAILY AS DIRECTED, Disp: 102 each, Rfl: 5 ?  ACCU-CHEK GUIDE test strip, USE TO CHECK BLOOD SUGARS ONCE DAILY AS DIRECTED, Disp: 100 strip, Rfl: 5 ?  acetaminophen (TYLENOL) 650 MG CR tablet, Take 650 mg by mouth 2 (two) times daily., Disp: , Rfl:  ?  aspirin 325 MG tablet, Take 1 tablet (325 mg total) by mouth daily., Disp: 60 tablet, Rfl: 0 ?  atorvastatin (LIPITOR) 40 MG tablet, Take 1 tablet (40 mg total) by mouth daily. For cholesterol., Disp: 90 tablet, Rfl: 3 ?  carvedilol (COREG) 12.5 MG tablet, Take 1 tablet (12.5 mg total) by mouth 2 (two) times daily with a meal., Disp: 180 tablet, Rfl: 3 ?  dapagliflozin propanediol (FARXIGA) 10 MG TABS tablet, Take 1 tablet (10 mg total) by mouth daily before breakfast., Disp: 90 tablet, Rfl: 3 ?  escitalopram (LEXAPRO) 10 MG tablet, Take 1 tablet (10 mg total) by mouth daily. For anxiety. Office visit required for further refills., Disp: 90 tablet, Rfl: 3 ?  escitalopram (LEXAPRO) 10 MG tablet, Take 1 tablet (10 mg total) by mouth daily. For anxiety., Disp: 10 tablet, Rfl: 0 ?  furosemide (LASIX) 20 MG tablet, TAKE 1 TABLET BY MOUTH EVERY DAY AS NEEDED  FOR FLUID/EDEMA (WEIGHT GAIN 3+ LBS OVERNIGHT), Disp: 30 tablet, Rfl: 11 ?  metFORMIN (GLUCOPHAGE) 1000 MG tablet, Take 1 tablet (1,000 mg total) by mouth 2 (two) times daily with a meal. For diabetes., Disp: 180 tablet, Rfl: 1 ?  sacubitril-valsartan (ENTRESTO) 49-51 MG, TAKE 1 TABLET 2 TIMES DAILY., Disp: 180 tablet, Rfl: 3 ?  spironolactone (ALDACTONE) 25 MG tablet, Take 1 tablet (25 mg total) by mouth daily., Disp: 30 tablet, Rfl: 9  ? ?Medications ordered in this encounter:  ?Meds ordered this encounter  ?Medications  ? nirmatrelvir/ritonavir EUA (PAXLOVID) 20 x 150 MG & 10 x 100MG  TABS  ?  Sig: Take 3 tablets by mouth 2 (two) times daily for 5 days. (Take nirmatrelvir 150 mg two tablets twice daily for 5 days and ritonavir 100 mg one tablet twice daily for 5 days) Patient GFR is 70  ?  Dispense:  30 tablet  ?  Refill:  0  ?  Order Specific Question:   Supervising Provider  ?  Answer:   [[id number]]  ?  ? ?*If you need refills on other medications prior to your next appointment, please contact your pharmacy* ? ?Follow-Up: ?Call back or seek an in-person evaluation if the symptoms worsen or if the condition fails to improve as anticipated. ? ?Other Instructions ?COVID test was positive ?You should remain isolated in your home for 5 days from symptom onset  AND greater than 72 hours after symptoms resolution (absence of fever without the use of fever-reducing medication and improvement in respiratory symptoms), whichever is longer ?Get plenty of rest and push fluids ?Paxlovid prescribed.  Take as directed and to completion ?Use zyrtec for nasal congestion, runny nose, and/or sore throat ?Use flonase for nasal congestion and runny nose ?Use medications daily for symptom relief ?Use OTC medications like ibuprofen or tylenol as needed fever or pain ?Follow up with PCP in 1-2 days via phone or e-visit for recheck and to ensure symptoms are improving ?Call or go to the ED if you have any new or worsening  symptoms such as fever, worsening cough, shortness of breath, chest tightness, chest pain, turning blue, changes in mental status, etc...  ? ? ?If you have been instructed to have an in-person evaluation today at a local Urgent Care facility, please use the link below. It will take you to a list of all of our available [facility], including address, phone number and hours of operation. Please do not delay care.  ?[facility] ? ?If you or a family member do not have a primary care provider, use the link below to schedule a visit and establish care. When you choose a [facility] primary care physician or advanced practice provider, you gain a long-term partner in health. ?Find a Primary Care Provider ? ?Learn more about [facility] in-office and virtual care options: ? - Get Care Now  ?

## 2021-11-01 NOTE — Progress Notes (Signed)
?Virtual Visit Consent  ? ?Jason Melendez, you are scheduled for a virtual visit with a [facility] provider today.   ?  ?Just as with appointments in the office, your consent must be obtained to participate.  Your consent will be active for this visit and any virtual visit you may have with one of our providers in the next 365 days.   ?  ?If you have a MyChart account, a copy of this consent can be sent to you electronically.  All virtual visits are billed to your insurance company just like a traditional visit in the office.   ? ?As this is a virtual visit, video technology does not allow for your provider to perform a traditional examination.  This may limit your provider's ability to fully assess your condition.  If your provider identifies any concerns that need to be evaluated in person or the need to arrange testing (such as labs, EKG, etc.), we will make arrangements to do so.   ?  ?Although advances in technology are sophisticated, we cannot ensure that it will always work on either your end or our end.  If the connection with a video visit is poor, the visit may have to be switched to a telephone visit.  With either a video or telephone visit, we are not always able to ensure that we have a secure connection.    ? ?I need to obtain your verbal consent now.   Are you willing to proceed with your visit today? Yes ?  ?Jason Melendez has provided verbal consent on 11/01/2021 for a virtual visit (video or telephone). ?  ?[place], PA-C  ? ?Date: 11/01/2021 5:49 PM ? ? ?Virtual Visit via Video Note  ? ?[name], connected with  Jason Melendez  ([id number], 06/11/1956) on 11/01/21 at  5:45 PM EST by a video-enabled telemedicine application and verified that I am speaking with the correct person using two identifiers. ? ?Location: ?Patient: Virtual Visit Location Patient: Home ?Provider: Virtual Visit Location Provider: Home Office ?  ?I discussed the limitations of evaluation and  management by telemedicine and the availability of in person appointments. The patient expressed understanding and agreed to proceed.   ? ?History of Present Illness: ?Jason Melendez is a 66 y.o. who identifies as a male who was assigned male at birth, and is being seen today for headache, and congestion, x 3-4 days ago.  Had positive covid exposure at work.  Tested positive for covid at home.  Has tried OTC medications with relief.  Denies aggravating factors.  Reports previous symptoms in the past.   Denies fever, chills, sore throat, SOB, wheezing, chest pain, nausea, changes in bowel or bladder habits.   ? ? ?ROS: As per HPI.  All other pertinent ROS negative.    ?HPI: HPI  ?Problems:  ?Patient Active Problem List  ? Diagnosis Date Noted  ? Chronic knee pain 07/18/2021  ? Chronic pain of lower extremity, bilateral 05/08/2020  ? Breast mass in male 11/01/2019  ? Syncope 03/30/2018  ? Hypertension 12/30/2017  ? Chronic combined systolic and diastolic heart failure (HCC) 11/16/2017  ? Preventative health care 01/22/2017  ? Neurocardiogenic syncope 11/12/2016  ? Type 2 diabetes mellitus (HCC) 10/27/2016  ? GAD (generalized anxiety disorder) 10/27/2016  ? Stenosis of left carotid artery   ? History of CVA (cerebrovascular accident) 10/13/2016  ? Hypertensive heart disease without CHF 10/13/2016  ? HLD (hyperlipidemia) 10/13/2016  ?  Obesity (BMI 30.0-34.9)   ?  ?Allergies:  ?Allergies  ?Allergen Reactions  ? Hydralazine Anaphylaxis  ? Hydralazine Hcl   ?  Sudden drop in BP  ? ?Medications:  ?Current Outpatient Medications:  ?  nirmatrelvir/ritonavir EUA (PAXLOVID) 20 x 150 MG & 10 x 100MG  TABS, Take 3 tablets by mouth 2 (two) times daily for 5 days. (Take nirmatrelvir 150 mg two tablets twice daily for 5 days and ritonavir 100 mg one tablet twice daily for 5 days) Patient GFR is 70, Disp: 30 tablet, Rfl: 0 ?  ACCU-CHEK FASTCLIX LANCETS MISC, USE TO CHECK BLOOD SUGARS ONCE DAILY AS DIRECTED, Disp: 102 each, Rfl:  5 ?  ACCU-CHEK GUIDE test strip, USE TO CHECK BLOOD SUGARS ONCE DAILY AS DIRECTED, Disp: 100 strip, Rfl: 5 ?  acetaminophen (TYLENOL) 650 MG CR tablet, Take 650 mg by mouth 2 (two) times daily., Disp: , Rfl:  ?  aspirin 325 MG tablet, Take 1 tablet (325 mg total) by mouth daily., Disp: 60 tablet, Rfl: 0 ?  atorvastatin (LIPITOR) 40 MG tablet, Take 1 tablet (40 mg total) by mouth daily. For cholesterol., Disp: 90 tablet, Rfl: 3 ?  carvedilol (COREG) 12.5 MG tablet, Take 1 tablet (12.5 mg total) by mouth 2 (two) times daily with a meal., Disp: 180 tablet, Rfl: 3 ?  dapagliflozin propanediol (FARXIGA) 10 MG TABS tablet, Take 1 tablet (10 mg total) by mouth daily before breakfast., Disp: 90 tablet, Rfl: 3 ?  escitalopram (LEXAPRO) 10 MG tablet, Take 1 tablet (10 mg total) by mouth daily. For anxiety. Office visit required for further refills., Disp: 90 tablet, Rfl: 3 ?  escitalopram (LEXAPRO) 10 MG tablet, Take 1 tablet (10 mg total) by mouth daily. For anxiety., Disp: 10 tablet, Rfl: 0 ?  furosemide (LASIX) 20 MG tablet, TAKE 1 TABLET BY MOUTH EVERY DAY AS NEEDED FOR FLUID/EDEMA (WEIGHT GAIN 3+ LBS OVERNIGHT), Disp: 30 tablet, Rfl: 11 ?  metFORMIN (GLUCOPHAGE) 1000 MG tablet, Take 1 tablet (1,000 mg total) by mouth 2 (two) times daily with a meal. For diabetes., Disp: 180 tablet, Rfl: 1 ?  sacubitril-valsartan (ENTRESTO) 49-51 MG, TAKE 1 TABLET 2 TIMES DAILY., Disp: 180 tablet, Rfl: 3 ?  spironolactone (ALDACTONE) 25 MG tablet, Take 1 tablet (25 mg total) by mouth daily., Disp: 30 tablet, Rfl: 9 ? ?Observations/Objective: ?Patient is well-developed, well-nourished in no acute distress.  ?Resting comfortably at home. Nontoxic ?Head is normocephalic, atraumatic.  ?No labored breathing. Speaking in full sentences and tolerating own secretions ?Speech is clear and coherent with logical content.  ?Patient is alert and oriented at baseline.  ? ?Assessment and Plan: ?1. COVID-19 virus infection ? ?COVID test was positive ?You  should remain isolated in your home for 5 days from symptom onset AND greater than 72 hours after symptoms resolution (absence of fever without the use of fever-reducing medication and improvement in respiratory symptoms), whichever is longer ?Get plenty of rest and push fluids ?Paxlovid prescribed.  Take as directed and to completion ?Use zyrtec for nasal congestion, runny nose, and/or sore throat ?Use flonase for nasal congestion and runny nose ?Use medications daily for symptom relief ?Use OTC medications like ibuprofen or tylenol as needed fever or pain ?Follow up with PCP in 1-2 days via phone or e-visit for recheck and to ensure symptoms are improving ?Call or go to the ED if you have any new or worsening symptoms such as fever, worsening cough, shortness of breath, chest tightness, chest pain, turning blue, changes in  mental status, etc...  ? ?Follow Up Instructions: ?I discussed the assessment and treatment plan with the patient. The patient was provided an opportunity to ask questions and all were answered. The patient agreed with the plan and demonstrated an understanding of the instructions.  A copy of instructions were sent to the patient via MyChart unless otherwise noted below.  ? ? ?The patient was advised to call back or seek an in-person evaluation if the symptoms worsen or if the condition fails to improve as anticipated. ? ?Time:  ?I spent 5-10 minutes with the patient via telehealth technology discussing the above problems/concerns.   ? ?[place], PA-C  ?

## 2021-11-22 ENCOUNTER — Other Ambulatory Visit: Payer: Self-pay | Admitting: Interventional Cardiology

## 2021-11-22 DIAGNOSIS — E782 Mixed hyperlipidemia: Secondary | ICD-10-CM

## 2021-11-23 ENCOUNTER — Encounter (HOSPITAL_BASED_OUTPATIENT_CLINIC_OR_DEPARTMENT_OTHER): Payer: Self-pay

## 2021-11-23 DIAGNOSIS — E782 Mixed hyperlipidemia: Secondary | ICD-10-CM

## 2021-11-24 ENCOUNTER — Other Ambulatory Visit: Payer: Self-pay | Admitting: Primary Care

## 2021-11-24 DIAGNOSIS — F411 Generalized anxiety disorder: Secondary | ICD-10-CM

## 2021-11-24 MED ORDER — CARVEDILOL 12.5 MG PO TABS: 12.5000 mg | ORAL_TABLET | Freq: Two times a day (BID) | ORAL | 3 refills | Status: DC

## 2021-11-24 NOTE — Telephone Encounter (Signed)
Pt called checking on the status of medication. Pt states he only has one pill left. Please advise. ?

## 2021-11-24 NOTE — Telephone Encounter (Signed)
Pt states [id number] [address], [place] - [address] ?

## 2021-11-25 ENCOUNTER — Telehealth: Payer: Self-pay

## 2021-11-25 DIAGNOSIS — E782 Mixed hyperlipidemia: Secondary | ICD-10-CM

## 2021-11-25 MED ORDER — CARVEDILOL 12.5 MG PO TABS: 12.5000 mg | ORAL_TABLET | Freq: Two times a day (BID) | ORAL | 3 refills | Status: DC

## 2021-11-28 LAB — COLOGUARD: COLOGUARD: NEGATIVE

## 2021-12-07 DIAGNOSIS — E119 Type 2 diabetes mellitus without complications: Secondary | ICD-10-CM

## 2021-12-08 MED ORDER — METFORMIN HCL 1000 MG PO TABS: 1000.0000 mg | ORAL_TABLET | Freq: Two times a day (BID) | ORAL | 1 refills | Status: DC

## 2021-12-20 ENCOUNTER — Other Ambulatory Visit: Payer: Self-pay | Admitting: Interventional Cardiology

## 2021-12-20 DIAGNOSIS — E782 Mixed hyperlipidemia: Secondary | ICD-10-CM

## 2022-01-15 ENCOUNTER — Encounter: Payer: Self-pay | Admitting: Primary Care

## 2022-01-15 ENCOUNTER — Ambulatory Visit (INDEPENDENT_AMBULATORY_CARE_PROVIDER_SITE_OTHER): Payer: HMO | Admitting: Primary Care

## 2022-01-15 VITALS — BP 130/62 | HR 68 | Temp 98.3°F | Ht 73.0 in | Wt 266.0 lb

## 2022-01-15 DIAGNOSIS — E1165 Type 2 diabetes mellitus with hyperglycemia: Secondary | ICD-10-CM

## 2022-01-15 DIAGNOSIS — Z23 Encounter for immunization: Secondary | ICD-10-CM

## 2022-01-15 DIAGNOSIS — E119 Type 2 diabetes mellitus without complications: Secondary | ICD-10-CM | POA: Diagnosis not present

## 2022-01-15 LAB — POCT GLYCOSYLATED HEMOGLOBIN (HGB A1C): Hemoglobin A1C: 6.2 % — AB (ref 4.0–5.6)

## 2022-01-15 NOTE — Assessment & Plan Note (Signed)
Improved with A1C of 6.2 today!  Continue metformin 1000 mg BID, Farxiga 10 mg daily.  Foot exam today. Eye exam UTD.  Managed on statin and ABR. Prevnar 20 due, provided today.  Discussed the importance of a healthy diet and regular exercise in order for weight loss, and to reduce the risk of further co-morbidity.  Follow up in 6 months.

## 2022-01-15 NOTE — Addendum Note (Signed)
Addended by: Francella Solian on: 01/15/2022 09:45 AM   Modules accepted: Orders

## 2022-01-15 NOTE — Patient Instructions (Signed)
Please schedule a physical to meet with me in 6 months.   It was a pleasure to see you today!   

## 2022-01-15 NOTE — Progress Notes (Signed)
Subjective:    Patient ID: Jason Melendez, male    DOB: 1955-12-10, 66 y.o.   MRN: [id number]  HPI  Jason Melendez is a very pleasant 66 y.o. male with a history of type 2 diabetes, hypertension, CHF, hyperlipidemia, CVA who presents today for follow-up diabetes.  Current medications include: Metformin 1000 mg twice daily, Farxiga 10 mg daily.  He is checking his blood glucose 0 times daily.  Last A1C: 6.6 in November 2022, 6.2 today Last Eye Exam: Due Last Foot Exam: Due Pneumonia Vaccination: Prevnar 20 due today. Urine Microalbumin: None, managed on ARB Statin: Atorvastatin  Dietary changes since last visit: None.   Exercise: None. Active at work.   BP Readings from Last 3 Encounters:  01/15/22 130/62  08/14/21 128/68  07/18/21 (!) 158/80       Review of Systems  Eyes:  Negative for visual disturbance.  Respiratory:  Negative for shortness of breath.   Cardiovascular:  Negative for chest pain.  Genitourinary:  Negative for difficulty urinating.  Neurological:  Negative for dizziness and numbness.        Past Medical History:  Diagnosis Date   Cardiopulmonary arrest (HCC)    Diabetes mellitus without complication (HCC)    HTN (hypertension)    Hyperlipidemia    Stroke [facility])     Social History   Socioeconomic History   Marital status: Married    Spouse name: Not on file   Number of children: Not on file   Years of education: Not on file   Highest education level: Not on file  Occupational History   Not on file  Tobacco Use   Smoking status: Never   Smokeless tobacco: Never  Vaping Use   Vaping Use: Never used  Substance and Sexual Activity   Alcohol use: No   Drug use: No   Sexual activity: Not on file  Other Topics Concern   Not on file  Social History Narrative   Marital status: married      Children: 2 daughters      Lives:  With wife      Employment: printing work      Tobacco: none      Alcohol: one beer every three  months      Drugs:  None      Exercise:  No formal exercise; walks up three flights of stairs several times per day at work.   Lives at home.  Works for [employer].     2 yrs college.     Social Determinants of Health   Financial Resource Strain: Not on file  Food Insecurity: Not on file  Transportation Needs: Not on file  Physical Activity: Not on file  Stress: Not on file  Social Connections: Not on file  Intimate Partner Violence: Not on file    Past Surgical History:  Procedure Laterality Date   ENDARTERECTOMY Left 10/16/2016   Procedure: ENDARTERECTOMY CAROTID;  Surgeon: Nada Libman, MD;  Location: [facility] OR;  Service: Vascular;  Laterality: Left;   HERNIA REPAIR     PATCH ANGIOPLASTY Left 10/16/2016   Procedure: PATCH ANGIOPLASTY USING Livia Snellen BIOLOGIC PATCH;  Surgeon: Nada Libman, MD;  Location: [facility] OR;  Service: Vascular;  Laterality: Left;    Family History  Problem Relation Age of Onset   Diabetes Mother    Heart disease Father 47       CABG   Diabetes Father    Kidney disease Father  Kidney failure Father    Hypertension Sister     Allergies  Allergen Reactions   Hydralazine Anaphylaxis   Hydralazine Hcl     Sudden drop in BP    Current Outpatient Medications on File Prior to Visit  Medication Sig Dispense Refill   ACCU-CHEK FASTCLIX LANCETS MISC USE TO CHECK BLOOD SUGARS ONCE DAILY AS DIRECTED 102 each 5   ACCU-CHEK GUIDE test strip USE TO CHECK BLOOD SUGARS ONCE DAILY AS DIRECTED 100 strip 5   acetaminophen (TYLENOL) 650 MG CR tablet Take 650 mg by mouth 2 (two) times daily.     aspirin 325 MG tablet Take 1 tablet (325 mg total) by mouth daily. 60 tablet 0   atorvastatin (LIPITOR) 40 MG tablet Take 1 tablet (40 mg total) by mouth daily. For cholesterol. 90 tablet 3   carvedilol (COREG) 12.5 MG tablet Take 1 tablet (12.5 mg total) by mouth 2 (two) times daily. Please keep upcoming appt. With Dr. Katrinka Blazing in June in order to receive future  refills. Thank You. 180 tablet 0   dapagliflozin propanediol (FARXIGA) 10 MG TABS tablet Take 1 tablet (10 mg total) by mouth daily before breakfast. 90 tablet 3   escitalopram (LEXAPRO) 10 MG tablet TAKE 1 TABLET (10 MG TOTAL) BY MOUTH DAILY. FOR ANXIETY. 90 tablet 1   furosemide (LASIX) 20 MG tablet TAKE 1 TABLET BY MOUTH EVERY DAY AS NEEDED FOR FLUID/EDEMA (WEIGHT GAIN 3+ LBS OVERNIGHT) 30 tablet 11   metFORMIN (GLUCOPHAGE) 1000 MG tablet Take 1 tablet (1,000 mg total) by mouth 2 (two) times daily with a meal. For diabetes. 180 tablet 1   sacubitril-valsartan (ENTRESTO) 49-51 MG TAKE 1 TABLET 2 TIMES DAILY. 180 tablet 3   spironolactone (ALDACTONE) 25 MG tablet Take 1 tablet (25 mg total) by mouth daily. 30 tablet 9   No current facility-administered medications on file prior to visit.    BP 130/62   Pulse 68   Temp 98.3 F (36.8 C) (Temporal)   Ht 6\' 1"  (1.854 m)   Wt 266 lb (120.7 kg)   SpO2 97%   BMI 35.09 kg/m  Objective:   Physical Exam Cardiovascular:     Rate and Rhythm: Normal rate and regular rhythm.  Pulmonary:     Effort: Pulmonary effort is normal.     Breath sounds: Normal breath sounds. No wheezing or rales.  Musculoskeletal:     Cervical back: Neck supple.  Skin:    General: Skin is warm and dry.  Neurological:     Mental Status: He is alert and oriented to person, place, and time.          Assessment & Plan:      This visit occurred during the SARS-CoV-2 public health emergency.  Safety protocols were in place, including screening questions prior to the visit, additional usage of staff PPE, and extensive cleaning of exam room while observing appropriate contact time as indicated for disinfecting solutions.

## 2022-02-15 ENCOUNTER — Other Ambulatory Visit: Payer: Self-pay | Admitting: Interventional Cardiology

## 2022-02-15 DIAGNOSIS — E782 Mixed hyperlipidemia: Secondary | ICD-10-CM

## 2022-02-22 ENCOUNTER — Encounter: Payer: Self-pay | Admitting: Interventional Cardiology

## 2022-02-23 NOTE — Progress Notes (Signed)
Cardiology Office Note:    Date:  02/26/2022   ID:  Jason Melendez, DOB [birth year], MRN [id number]  PCP:  Doreene Nest, NP  Cardiologist:  Lesleigh Noe, MD   Referring MD: Doreene Nest, NP   Chief Complaint  Patient presents with   Congestive Heart Failure    History of Present Illness:    Jason Melendez is a 66 y.o. male with a hx of  left brain stroke, high-grade left carotid stenosis treated with surgery by Dr. Myra Gianotti, asymptomatic systolic heart failure, hypertensive heart disease, hyperlipidemia,  recurrent episodes of syncope (vasovagal) and chronic combined systolic and diastolic heart failure on guideline directed medical therapy for LV preservation. Presumed etiology of CM is HTN - negative Myoview in February of 2018. He has not had cardiac cath.   [name] started in January 2023.  No dyspnea, orthopnea, PND, or other complaints.  Has not needed to take Lasix since [name] was started.  He denies angina.  He has had no neurological complaints.  He denies chest pain.  Past Medical History:  Diagnosis Date   Cardiopulmonary arrest (HCC)    Diabetes mellitus without complication (HCC)    HTN (hypertension)    Hyperlipidemia    Stroke [facility])     Past Surgical History:  Procedure Laterality Date   ENDARTERECTOMY Left 10/16/2016   Procedure: ENDARTERECTOMY CAROTID;  Surgeon: Nada Libman, MD;  Location: [facility] OR;  Service: Vascular;  Laterality: Left;   HERNIA REPAIR     PATCH ANGIOPLASTY Left 10/16/2016   Procedure: PATCH ANGIOPLASTY USING Livia Snellen BIOLOGIC PATCH;  Surgeon: Nada Libman, MD;  Location: [facility] OR;  Service: Vascular;  Laterality: Left;    Current Medications: Current Meds  Medication Sig   ACCU-CHEK FASTCLIX LANCETS MISC USE TO CHECK BLOOD SUGARS ONCE DAILY AS DIRECTED   ACCU-CHEK GUIDE test strip USE TO CHECK BLOOD SUGARS ONCE DAILY AS DIRECTED   acetaminophen (TYLENOL) 650 MG CR tablet Take 650 mg by mouth 2 (two) times  daily.   aspirin 325 MG tablet Take 1 tablet (325 mg total) by mouth daily.   atorvastatin (LIPITOR) 40 MG tablet Take 1 tablet (40 mg total) by mouth daily. For cholesterol.   carvedilol (COREG) 12.5 MG tablet Take 1 tablet (12.5 mg total) by mouth in the morning and at bedtime. Please keep upcoming appointment in June 2023 for future refills. Thank you   dapagliflozin propanediol (FARXIGA) 10 MG TABS tablet Take 1 tablet (10 mg total) by mouth daily before breakfast.   escitalopram (LEXAPRO) 10 MG tablet TAKE 1 TABLET (10 MG TOTAL) BY MOUTH DAILY. FOR ANXIETY.   furosemide (LASIX) 20 MG tablet TAKE 1 TABLET BY MOUTH EVERY DAY AS NEEDED FOR FLUID/EDEMA (WEIGHT GAIN 3+ LBS OVERNIGHT)   metFORMIN (GLUCOPHAGE) 1000 MG tablet Take 1 tablet (1,000 mg total) by mouth 2 (two) times daily with a meal. For diabetes.   sacubitril-valsartan (ENTRESTO) 49-51 MG TAKE 1 TABLET 2 TIMES DAILY.   spironolactone (ALDACTONE) 25 MG tablet Take 1 tablet (25 mg total) by mouth daily.     Allergies:   Hydralazine and Hydralazine hcl   Social History   Socioeconomic History   Marital status: Married    Spouse name: Not on file   Number of children: Not on file   Years of education: Not on file   Highest education level: Not on file  Occupational History   Not on file  Tobacco Use   Smoking  status: Never   Smokeless tobacco: Never  Vaping Use   Vaping Use: Never used  Substance and Sexual Activity   Alcohol use: No   Drug use: No   Sexual activity: Not on file  Other Topics Concern   Not on file  Social History Narrative   Marital status: married      Children: 2 daughters      Lives:  With wife      Employment: printing work      Tobacco: none      Alcohol: one beer every three months      Drugs:  None      Exercise:  No formal exercise; walks up three flights of stairs several times per day at work.   Lives at home.  Works for [employer].     2 yrs college.     Social  Determinants of Health   Financial Resource Strain: Not on file  Food Insecurity: Not on file  Transportation Needs: Not on file  Physical Activity: Not on file  Stress: Not on file  Social Connections: Not on file     Family History: The patient's family history includes Diabetes in his father and mother; Heart disease (age of onset: 50) in his father; Hypertension in his sister; Kidney disease in his father; Kidney failure in his father.  ROS:   Please see the history of present illness.    Knees and perforated right eardrum prevent significantly certain activities and impair quality of life.  Does not want to get his knees operated on because he is still working.  All other systems reviewed and are negative.  EKGs/Labs/Other Studies Reviewed:    The following studies were reviewed today: ECHOCARDIOGRAM 09/08/2021: IMPRESSIONS     1. Left ventricular ejection fraction, by estimation, is 40 to 45%. The  left ventricle has mildly decreased function. The left ventricle  demonstrates global hypokinesis. There is mild concentric left ventricular  hypertrophy. Left ventricular diastolic  parameters are consistent with Grade I diastolic dysfunction (impaired  relaxation).   2. Right ventricular systolic function is normal. The right ventricular  size is normal.   3. Left atrial size was mildly dilated.   4. The mitral valve is grossly normal. Trivial mitral valve  regurgitation. Moderate mitral annular calcification.   5. The aortic valve is tricuspid. There is mild calcification of the  aortic valve. There is mild thickening of the aortic valve. Aortic valve  regurgitation is trivial. Aortic valve sclerosis/calcification is present,  without any evidence of aortic  stenosis.   6. Aortic dilatation noted. There is mild dilatation of the aortic root,  measuring 39 mm.   Comparison(s): Compared to prior TTE in 2020, there is no significant  change.   Bilateral lower extremity  vascular Doppler study January 2023. Summary:  Right: Resting right ankle-brachial index is within normal range. No  evidence of significant right lower extremity arterial disease. The right  toe-brachial index is normal.   Left: Resting left ankle-brachial index is within normal range. No  evidence of significant left lower extremity arterial disease. The left  toe-brachial index is abnormal.   EKG:  EKG not repeated  Recent Labs: 07/18/2021: ALT 11; Hemoglobin 12.8; Platelets 176 10/13/2021: BUN 16; Creatinine, Ser 1.16; Potassium 4.7; Sodium 148  Recent Lipid Panel    Component Value Date/Time   CHOL 119 07/18/2021 1512   CHOL 111 03/22/2020 0723   TRIG 258 (H) 07/18/2021 1512   HDL 25 (  L) 07/18/2021 1512   HDL 24 (L) 03/22/2020 0723   CHOLHDL 4.8 07/18/2021 1512   VLDL 41.2 (H) 03/04/2018 0745   LDLCALC 64 07/18/2021 1512   LDLDIRECT 52.0 03/04/2018 0745    Physical Exam:    VS:  BP 118/68   Pulse 67   Ht 6\' 2"  (1.88 m)   Wt 264 lb 12.8 oz (120.1 kg)   SpO2 97%   BMI 34.00 kg/m     Wt Readings from Last 3 Encounters:  02/26/22 264 lb 12.8 oz (120.1 kg)  01/15/22 266 lb (120.7 kg)  08/14/21 278 lb 14.4 oz (126.5 kg)     GEN: Obese with BMI 34. No acute distress HEENT: Normal NECK: No JVD. LYMPHATICS: No lymphadenopathy CARDIAC: No murmur. RRR no gallop, or edema. VASCULAR:  Normal Pulses. No bruits. RESPIRATORY:  Clear to auscultation without rales, wheezing or rhonchi  ABDOMEN: Soft, non-tender, non-distended, No pulsatile mass, MUSCULOSKELETAL: No deformity  SKIN: Warm and dry NEUROLOGIC:  Alert and oriented x 3 PSYCHIATRIC:  Normal affect   ASSESSMENT:    1. Chronic combined systolic and diastolic heart failure (HCC)   2. History of CVA (cerebrovascular accident)   3. Mixed hyperlipidemia   4. Mild dilation of ascending aorta (HCC)   5. Claudication (HCC)   6. Prediabetes    PLAN:    In order of problems listed above:  Continue quadruple  therapy at current doses.  Blood work in February was good.  He has blood work in another 2 to 3 months.  That should include potassium and creatinine. No new symptoms Continue Lipitor.  Last LDL was less than 70. Most recent parameter was 39 mm.  This was by echo. Denies claudication.  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 Hemoglobin A1c is 6.2 in May 2023.  Guideline directed therapy for left ventricular systolic dysfunction: Angiotensin receptor-neprilysin inhibitor (ARNI)-Entresto; beta-blocker therapy - carvedilol, metoprolol succinate, or bisoprolol; mineralocorticoid receptor antagonist (MRA) therapy -spironolactone or eplerenone.  SGLT-2 agents -  Dapagliflozin June 2023) or Empagliflozin (Jardiance).These therapies have been shown to improve clinical outcomes including reduction of rehospitalization, survival, and acute heart failure.    Medication Adjustments/Labs and Tests Ordered: Current medicines are reviewed at length with the patient today.  Concerns regarding medicines are outlined above.  No orders of the defined types were placed in this encounter.  No orders of the defined types were placed in this encounter.   There are no Patient Instructions on file for this visit.   Signed, [name], MD  02/26/2022 8:14 AM    [facility]

## 2022-02-26 ENCOUNTER — Encounter: Payer: Self-pay | Admitting: Interventional Cardiology

## 2022-02-26 ENCOUNTER — Ambulatory Visit (INDEPENDENT_AMBULATORY_CARE_PROVIDER_SITE_OTHER): Payer: HMO | Admitting: Interventional Cardiology

## 2022-02-26 VITALS — BP 118/68 | HR 67 | Ht 74.0 in | Wt 264.8 lb

## 2022-02-26 DIAGNOSIS — I5042 Chronic combined systolic (congestive) and diastolic (congestive) heart failure: Secondary | ICD-10-CM

## 2022-02-26 DIAGNOSIS — Z8673 Personal history of transient ischemic attack (TIA), and cerebral infarction without residual deficits: Secondary | ICD-10-CM

## 2022-02-26 DIAGNOSIS — I77810 Thoracic aortic ectasia: Secondary | ICD-10-CM | POA: Diagnosis not present

## 2022-02-26 DIAGNOSIS — E782 Mixed hyperlipidemia: Secondary | ICD-10-CM

## 2022-02-26 DIAGNOSIS — R7303 Prediabetes: Secondary | ICD-10-CM

## 2022-02-26 DIAGNOSIS — I739 Peripheral vascular disease, unspecified: Secondary | ICD-10-CM

## 2022-02-26 NOTE — Patient Instructions (Signed)
Medication Instructions:  Your physician recommends that you continue on your current medications as directed. Please refer to the Current Medication list given to you today.  *If you need a refill on your cardiac medications before your next appointment, please call your pharmacy*  Lab Work: NONE  Testing/Procedures: NONE  Follow-Up: At [facility], you and your health needs are our priority.  As part of our continuing mission to provide you with exceptional heart care, we have created designated Provider Care Teams.  These Care Teams include your primary Cardiologist (physician) and Advanced Practice Providers (APPs -  Physician Assistants and Nurse Practitioners) who all work together to provide you with the care you need, when you need it.  Your next appointment:   6 month(s) **Please call our office in September or October to schedule your follow-up appointment due in January 2024. Our number is [phone].**  The format for your next appointment:   In Person  Provider:   Lesleigh Noe, MD {   Important Information About Sugar

## 2022-03-12 ENCOUNTER — Emergency Department (HOSPITAL_COMMUNITY)
Admission: EM | Admit: 2022-03-12 | Discharge: 2022-03-12 | Discharge status: Against Medical Advice | Payer: HMO | Source: Home / Self Care

## 2022-03-12 DIAGNOSIS — Z5321 Procedure and treatment not carried out due to patient leaving prior to being seen by health care provider: Secondary | ICD-10-CM | POA: Insufficient documentation

## 2022-03-12 DIAGNOSIS — R531 Weakness: Secondary | ICD-10-CM | POA: Insufficient documentation

## 2022-03-12 DIAGNOSIS — E872 Acidosis, unspecified: Secondary | ICD-10-CM | POA: Diagnosis not present

## 2022-03-12 DIAGNOSIS — R110 Nausea: Secondary | ICD-10-CM | POA: Insufficient documentation

## 2022-03-12 DIAGNOSIS — A419 Sepsis, unspecified organism: Secondary | ICD-10-CM | POA: Diagnosis not present

## 2022-03-12 DIAGNOSIS — R197 Diarrhea, unspecified: Secondary | ICD-10-CM | POA: Insufficient documentation

## 2022-03-12 LAB — COMPREHENSIVE METABOLIC PANEL
ALT: 15 U/L (ref 0–44)
AST: 26 U/L (ref 15–41)
Albumin: 3.8 g/dL (ref 3.5–5.0)
Alkaline Phosphatase: 28 U/L — ABNORMAL LOW (ref 38–126)
Anion gap: 14 (ref 5–15)
BUN: 16 mg/dL (ref 8–23)
CO2: 22 mmol/L (ref 22–32)
Calcium: 9.7 mg/dL (ref 8.9–10.3)
Chloride: 105 mmol/L (ref 98–111)
Creatinine, Ser: 1.13 mg/dL (ref 0.61–1.24)
GFR, Estimated: >60 mL/min (ref >60)
Glucose, Bld: 166 mg/dL — ABNORMAL HIGH (ref 70–99)
Potassium: 4.2 mmol/L (ref 3.5–5.1)
Sodium: 141 mmol/L (ref 135–145)
Total Bilirubin: 1.7 mg/dL — ABNORMAL HIGH (ref 0.3–1.2)
Total Protein: 6.8 g/dL (ref 6.5–8.1)

## 2022-03-12 LAB — CBC WITH DIFFERENTIAL/PLATELET
Abs Immature Granulocytes: 0.12 10*3/uL — ABNORMAL HIGH (ref 0.00–0.07)
Basophils Absolute: 0 10*3/uL (ref 0.0–0.1)
Basophils Relative: 0 %
Eosinophils Absolute: 0 10*3/uL (ref 0.0–0.5)
Eosinophils Relative: 0 %
HCT: 42.9 % (ref 39.0–52.0)
Hemoglobin: 14 g/dL (ref 13.0–17.0)
Immature Granulocytes: 1 %
Lymphocytes Relative: 5 %
Lymphs Abs: 0.7 10*3/uL (ref 0.7–4.0)
MCH: 31.5 pg (ref 26.0–34.0)
MCHC: 32.6 g/dL (ref 30.0–36.0)
MCV: 96.6 fL (ref 80.0–100.0)
Monocytes Absolute: 0.9 10*3/uL (ref 0.1–1.0)
Monocytes Relative: 7 %
Neutro Abs: 10.6 10*3/uL — ABNORMAL HIGH (ref 1.7–7.7)
Neutrophils Relative %: 87 %
Platelets: 140 10*3/uL — ABNORMAL LOW (ref 150–400)
RBC: 4.44 MIL/uL (ref 4.22–5.81)
RDW: 12.7 % (ref 11.5–15.5)
WBC: 12.3 10*3/uL — ABNORMAL HIGH (ref 4.0–10.5)
nRBC: 0 % (ref 0.0–0.2)

## 2022-03-12 LAB — URINALYSIS, ROUTINE W REFLEX MICROSCOPIC
Bacteria, UA: NONE SEEN
Bilirubin Urine: NEGATIVE
Glucose, UA: >=500 mg/dL — AB
Hgb urine dipstick: NEGATIVE
Ketones, ur: 20 mg/dL — AB
Leukocytes,Ua: NEGATIVE
Nitrite: NEGATIVE
Protein, ur: NEGATIVE mg/dL
Specific Gravity, Urine: 1.028 (ref 1.005–1.030)
pH: 5 (ref 5.0–8.0)

## 2022-03-12 LAB — LACTIC ACID, PLASMA: Lactic Acid, Venous: 2.1 mmol/L (ref 0.5–1.9)

## 2022-03-12 MED ORDER — ACETAMINOPHEN 325 MG PO TABS
650.0000 mg | ORAL_TABLET | Freq: Once | ORAL | Status: AC | PRN
Start: 2022-03-12 — End: 2022-03-12
  Administered 2022-03-12: 650 mg via ORAL
  Filled 2022-03-12: qty 2

## 2022-03-12 MED ORDER — ONDANSETRON 4 MG PO TBDP
4.0000 mg | ORAL_TABLET | Freq: Once | ORAL | Status: AC
Start: 2022-03-12 — End: 2022-03-12
  Administered 2022-03-12: 4 mg via ORAL

## 2022-03-12 NOTE — ED Triage Notes (Signed)
Patient complains of lightheadedness and fever that started earlier today, reports someone else he is in contact with had a similar experience yesterday. Also reports diarrhea. Denies abdominal pain, reports dry heaving.

## 2022-03-12 NOTE — ED Notes (Signed)
Patient states he wants to leave. Consulting civil engineer notified. Patient states he will follow up with [facility] in the morning.

## 2022-03-12 NOTE — ED Provider Triage Note (Signed)
Emergency Medicine Provider Triage Evaluation Note  Dayan Desa , a 66 y.o. male  was evaluated in triage.  Pt complains of generalized weakness onset today.  Has sick contacts with similar symptoms at home.  Has associated diarrhea and nausea.  No meds tried prior to arrival.  Denies vomiting, fever, abdominal pain.  Review of Systems  Positive: As per HPI Negative:   Physical Exam  BP 134/81 (BP Location: Left Arm)   Pulse (!) 115   Temp (!) 102.9 F (39.4 C) (Oral)   Resp (!) 22   SpO2 93%  Gen:   Awake, no distress   Resp:  Normal effort  MSK:   Moves extremities without difficulty  Other:  No abdominal tenderness to palpation  Medical Decision Making  Medically screening exam initiated at 4:17 PM.  Appropriate orders placed.  [name] was informed that the remainder of the evaluation will be completed by another provider, this initial triage assessment does not replace that evaluation, and the importance of remaining in the ED until their evaluation is complete.  Work-up initiated   Taeko Schaffer A, PA-C 03/12/22 1618

## 2022-03-13 ENCOUNTER — Emergency Department (HOSPITAL_BASED_OUTPATIENT_CLINIC_OR_DEPARTMENT_OTHER)
Admit: 2022-03-13 | Discharge: 2022-03-13 | Disposition: A | Discharge status: Home / Self Care | Payer: HMO | Attending: Emergency Medicine | Admitting: Emergency Medicine

## 2022-03-13 ENCOUNTER — Inpatient Hospital Stay (HOSPITAL_COMMUNITY)
Admission: EM | Admit: 2022-03-13 | Discharge: 2022-03-20 | DRG: 871 | Disposition: A | Discharge status: Home / Self Care | Payer: HMO | Attending: Internal Medicine | Admitting: Internal Medicine

## 2022-03-13 ENCOUNTER — Emergency Department (HOSPITAL_COMMUNITY): Payer: HMO

## 2022-03-13 ENCOUNTER — Other Ambulatory Visit: Payer: Self-pay

## 2022-03-13 ENCOUNTER — Encounter (HOSPITAL_COMMUNITY): Payer: Self-pay

## 2022-03-13 ENCOUNTER — Ambulatory Visit
Admission: EM | Admit: 2022-03-13 | Discharge: 2022-03-13 | Disposition: A | Discharge status: Home / Self Care | Payer: HMO | Attending: Primary Care | Admitting: Primary Care

## 2022-03-13 VITALS — BP 99/61 | HR 85 | Temp 97.8°F | Resp 18

## 2022-03-13 DIAGNOSIS — N179 Acute kidney failure, unspecified: Secondary | ICD-10-CM | POA: Diagnosis present

## 2022-03-13 DIAGNOSIS — Z833 Family history of diabetes mellitus: Secondary | ICD-10-CM

## 2022-03-13 DIAGNOSIS — L538 Other specified erythematous conditions: Secondary | ICD-10-CM | POA: Diagnosis not present

## 2022-03-13 DIAGNOSIS — F32A Depression, unspecified: Secondary | ICD-10-CM | POA: Diagnosis present

## 2022-03-13 DIAGNOSIS — Z20822 Contact with and (suspected) exposure to covid-19: Secondary | ICD-10-CM | POA: Diagnosis present

## 2022-03-13 DIAGNOSIS — A419 Sepsis, unspecified organism: Secondary | ICD-10-CM | POA: Diagnosis not present

## 2022-03-13 DIAGNOSIS — E785 Hyperlipidemia, unspecified: Secondary | ICD-10-CM | POA: Diagnosis present

## 2022-03-13 DIAGNOSIS — E872 Acidosis, unspecified: Secondary | ICD-10-CM | POA: Diagnosis present

## 2022-03-13 DIAGNOSIS — R609 Edema, unspecified: Secondary | ICD-10-CM | POA: Diagnosis not present

## 2022-03-13 DIAGNOSIS — Z7984 Long term (current) use of oral hypoglycemic drugs: Secondary | ICD-10-CM

## 2022-03-13 DIAGNOSIS — N200 Calculus of kidney: Secondary | ICD-10-CM | POA: Diagnosis present

## 2022-03-13 DIAGNOSIS — Z888 Allergy status to other drugs, medicaments and biological substances status: Secondary | ICD-10-CM

## 2022-03-13 DIAGNOSIS — R6520 Severe sepsis without septic shock: Secondary | ICD-10-CM | POA: Diagnosis present

## 2022-03-13 DIAGNOSIS — Z6833 Body mass index (BMI) 33.0-33.9, adult: Secondary | ICD-10-CM

## 2022-03-13 DIAGNOSIS — E669 Obesity, unspecified: Secondary | ICD-10-CM | POA: Diagnosis present

## 2022-03-13 DIAGNOSIS — R81 Glycosuria: Secondary | ICD-10-CM | POA: Diagnosis present

## 2022-03-13 DIAGNOSIS — Z7982 Long term (current) use of aspirin: Secondary | ICD-10-CM

## 2022-03-13 DIAGNOSIS — N209 Urinary calculus, unspecified: Secondary | ICD-10-CM | POA: Diagnosis present

## 2022-03-13 DIAGNOSIS — L03115 Cellulitis of right lower limb: Secondary | ICD-10-CM | POA: Diagnosis present

## 2022-03-13 DIAGNOSIS — H538 Other visual disturbances: Secondary | ICD-10-CM | POA: Diagnosis present

## 2022-03-13 DIAGNOSIS — Z841 Family history of disorders of kidney and ureter: Secondary | ICD-10-CM

## 2022-03-13 DIAGNOSIS — Z8249 Family history of ischemic heart disease and other diseases of the circulatory system: Secondary | ICD-10-CM

## 2022-03-13 DIAGNOSIS — K760 Fatty (change of) liver, not elsewhere classified: Secondary | ICD-10-CM | POA: Diagnosis present

## 2022-03-13 DIAGNOSIS — B353 Tinea pedis: Secondary | ICD-10-CM

## 2022-03-13 DIAGNOSIS — E119 Type 2 diabetes mellitus without complications: Secondary | ICD-10-CM

## 2022-03-13 DIAGNOSIS — I700 Atherosclerosis of aorta: Secondary | ICD-10-CM | POA: Diagnosis present

## 2022-03-13 DIAGNOSIS — I959 Hypotension, unspecified: Secondary | ICD-10-CM

## 2022-03-13 DIAGNOSIS — R350 Frequency of micturition: Secondary | ICD-10-CM | POA: Diagnosis present

## 2022-03-13 DIAGNOSIS — G9341 Metabolic encephalopathy: Secondary | ICD-10-CM | POA: Diagnosis present

## 2022-03-13 DIAGNOSIS — Z79899 Other long term (current) drug therapy: Secondary | ICD-10-CM

## 2022-03-13 DIAGNOSIS — I110 Hypertensive heart disease with heart failure: Secondary | ICD-10-CM | POA: Diagnosis present

## 2022-03-13 DIAGNOSIS — I5042 Chronic combined systolic (congestive) and diastolic (congestive) heart failure: Secondary | ICD-10-CM | POA: Diagnosis present

## 2022-03-13 DIAGNOSIS — Z8673 Personal history of transient ischemic attack (TIA), and cerebral infarction without residual deficits: Secondary | ICD-10-CM

## 2022-03-13 DIAGNOSIS — B351 Tinea unguium: Secondary | ICD-10-CM | POA: Diagnosis present

## 2022-03-13 DIAGNOSIS — Z8674 Personal history of sudden cardiac arrest: Secondary | ICD-10-CM

## 2022-03-13 HISTORY — DX: Tinea pedis: B35.3

## 2022-03-13 HISTORY — DX: Sepsis, unspecified organism: A41.9

## 2022-03-13 HISTORY — DX: Cellulitis of right lower limb: L03.115

## 2022-03-13 HISTORY — DX: Urinary calculus, unspecified: N20.9

## 2022-03-13 LAB — LACTIC ACID, PLASMA
Lactic Acid, Venous: 4.4 mmol/L (ref 0.5–1.9)
Lactic Acid, Venous: 3.8 mmol/L (ref 0.5–1.9)
Lactic Acid, Venous: 1.5 mmol/L (ref 0.5–1.9)

## 2022-03-13 LAB — COMPREHENSIVE METABOLIC PANEL
ALT: 14 U/L (ref 0–44)
AST: 23 U/L (ref 15–41)
Albumin: 3.8 g/dL (ref 3.5–5.0)
Alkaline Phosphatase: 27 U/L — ABNORMAL LOW (ref 38–126)
Anion gap: 13 (ref 5–15)
BUN: 21 mg/dL (ref 8–23)
CO2: 20 mmol/L — ABNORMAL LOW (ref 22–32)
Calcium: 9.4 mg/dL (ref 8.9–10.3)
Chloride: 104 mmol/L (ref 98–111)
Creatinine, Ser: 1.53 mg/dL — ABNORMAL HIGH (ref 0.61–1.24)
GFR, Estimated: 50 mL/min — ABNORMAL LOW (ref >60)
Glucose, Bld: 242 mg/dL — ABNORMAL HIGH (ref 70–99)
Potassium: 4.5 mmol/L (ref 3.5–5.1)
Sodium: 137 mmol/L (ref 135–145)
Total Bilirubin: 1.4 mg/dL — ABNORMAL HIGH (ref 0.3–1.2)
Total Protein: 7.3 g/dL (ref 6.5–8.1)

## 2022-03-13 LAB — URINALYSIS, ROUTINE W REFLEX MICROSCOPIC
Bilirubin Urine: NEGATIVE
Glucose, UA: >=500 mg/dL — AB
Hgb urine dipstick: NEGATIVE
Ketones, ur: NEGATIVE mg/dL
Leukocytes,Ua: NEGATIVE
Nitrite: NEGATIVE
Protein, ur: NEGATIVE mg/dL
Specific Gravity, Urine: 1.032 — ABNORMAL HIGH (ref 1.005–1.030)
pH: 5 (ref 5.0–8.0)

## 2022-03-13 LAB — CBC WITH DIFFERENTIAL/PLATELET
Abs Immature Granulocytes: 0.09 10*3/uL — ABNORMAL HIGH (ref 0.00–0.07)
Basophils Absolute: 0 10*3/uL (ref 0.0–0.1)
Basophils Relative: 0 %
Eosinophils Absolute: 0 10*3/uL (ref 0.0–0.5)
Eosinophils Relative: 0 %
HCT: 43.4 % (ref 39.0–52.0)
Hemoglobin: 13.8 g/dL (ref 13.0–17.0)
Immature Granulocytes: 1 %
Lymphocytes Relative: 11 %
Lymphs Abs: 1.2 10*3/uL (ref 0.7–4.0)
MCH: 31.2 pg (ref 26.0–34.0)
MCHC: 31.8 g/dL (ref 30.0–36.0)
MCV: 98.2 fL (ref 80.0–100.0)
Monocytes Absolute: 0.2 10*3/uL (ref 0.1–1.0)
Monocytes Relative: 2 %
Neutro Abs: 9.2 10*3/uL — ABNORMAL HIGH (ref 1.7–7.7)
Neutrophils Relative %: 86 %
Platelets: 146 10*3/uL — ABNORMAL LOW (ref 150–400)
RBC: 4.42 MIL/uL (ref 4.22–5.81)
RDW: 12.9 % (ref 11.5–15.5)
WBC: 10.7 10*3/uL — ABNORMAL HIGH (ref 4.0–10.5)
nRBC: 0 % (ref 0.0–0.2)

## 2022-03-13 LAB — PROTIME-INR
INR: 1.1 (ref 0.8–1.2)
Prothrombin Time: 14.2 seconds (ref 11.4–15.2)

## 2022-03-13 LAB — RESP PANEL BY RT-PCR (FLU A&B, COVID) ARPGX2
Influenza A by PCR: NEGATIVE
Influenza B by PCR: NEGATIVE
SARS Coronavirus 2 by RT PCR: NEGATIVE

## 2022-03-13 LAB — GLUCOSE, CAPILLARY
Glucose-Capillary: 113 mg/dL — ABNORMAL HIGH (ref 70–99)
Glucose-Capillary: 139 mg/dL — ABNORMAL HIGH (ref 70–99)

## 2022-03-13 LAB — TROPONIN I (HIGH SENSITIVITY)
Troponin I (High Sensitivity): 6 ng/L (ref <18)
Troponin I (High Sensitivity): 5 ng/L (ref <18)

## 2022-03-13 LAB — APTT: aPTT: 28 seconds (ref 24–36)

## 2022-03-13 MED ORDER — CLOTRIMAZOLE 1 % EX CREA
TOPICAL_CREAM | Freq: Two times a day (BID) | CUTANEOUS | Status: DC
  Administered 2022-03-13 – 2022-03-19 (×5): 1 via TOPICAL
  Filled 2022-03-13: qty 15

## 2022-03-13 MED ORDER — CARVEDILOL 12.5 MG PO TABS
12.5000 mg | ORAL_TABLET | Freq: Two times a day (BID) | ORAL | Status: DC
  Administered 2022-03-13 – 2022-03-20 (×14): 12.5 mg via ORAL
  Filled 2022-03-13 (×14): qty 1

## 2022-03-13 MED ORDER — ONDANSETRON HCL 4 MG PO TABS: 4.0000 mg | ORAL_TABLET | Freq: Four times a day (QID) | ORAL | Status: DC | PRN

## 2022-03-13 MED ORDER — ESCITALOPRAM OXALATE 10 MG PO TABS
10.0000 mg | ORAL_TABLET | Freq: Every evening | ORAL | Status: DC
  Administered 2022-03-13 – 2022-03-19 (×7): 10 mg via ORAL
  Filled 2022-03-13 (×7): qty 1

## 2022-03-13 MED ORDER — SODIUM CHLORIDE 0.9 % IV BOLUS
500.0000 mL | Freq: Once | INTRAVENOUS | Status: AC
  Administered 2022-03-13: 500 mL via INTRAVENOUS

## 2022-03-13 MED ORDER — ENOXAPARIN SODIUM 60 MG/0.6ML IJ SOSY
60.0000 mg | PREFILLED_SYRINGE | INTRAMUSCULAR | Status: DC
  Administered 2022-03-13 – 2022-03-19 (×7): 60 mg via SUBCUTANEOUS
  Filled 2022-03-13 (×8): qty 0.6

## 2022-03-13 MED ORDER — KETOROLAC TROMETHAMINE 15 MG/ML IJ SOLN
15.0000 mg | Freq: Once | INTRAMUSCULAR | Status: AC
  Administered 2022-03-13: 15 mg via INTRAVENOUS
  Filled 2022-03-13: qty 1

## 2022-03-13 MED ORDER — ONDANSETRON HCL 4 MG/2ML IJ SOLN
4.0000 mg | Freq: Four times a day (QID) | INTRAMUSCULAR | Status: DC | PRN
  Administered 2022-03-16: 4 mg via INTRAVENOUS
  Filled 2022-03-13: qty 2

## 2022-03-13 MED ORDER — SPIRONOLACTONE 25 MG PO TABS
25.0000 mg | ORAL_TABLET | Freq: Every day | ORAL | Status: DC
  Administered 2022-03-14 – 2022-03-20 (×7): 25 mg via ORAL
  Filled 2022-03-13 (×7): qty 1

## 2022-03-13 MED ORDER — ATORVASTATIN CALCIUM 40 MG PO TABS
40.0000 mg | ORAL_TABLET | Freq: Every day | ORAL | Status: DC
  Administered 2022-03-13 – 2022-03-19 (×7): 40 mg via ORAL
  Filled 2022-03-13 (×7): qty 1

## 2022-03-13 MED ORDER — SACUBITRIL-VALSARTAN 49-51 MG PO TABS
1.0000 | ORAL_TABLET | Freq: Two times a day (BID) | ORAL | Status: DC
  Administered 2022-03-13 – 2022-03-20 (×14): 1 via ORAL
  Filled 2022-03-13 (×15): qty 1

## 2022-03-13 MED ORDER — ACETAMINOPHEN 325 MG PO TABS
650.0000 mg | ORAL_TABLET | Freq: Four times a day (QID) | ORAL | Status: DC | PRN
  Administered 2022-03-13 – 2022-03-19 (×3): 650 mg via ORAL
  Filled 2022-03-13 (×3): qty 2

## 2022-03-13 MED ORDER — METFORMIN HCL 500 MG PO TABS
1000.0000 mg | ORAL_TABLET | Freq: Two times a day (BID) | ORAL | Status: DC
  Administered 2022-03-14 – 2022-03-20 (×13): 1000 mg via ORAL
  Filled 2022-03-13 (×13): qty 2

## 2022-03-13 MED ORDER — ASPIRIN 325 MG PO TABS
325.0000 mg | ORAL_TABLET | Freq: Every day | ORAL | Status: DC
  Administered 2022-03-14 – 2022-03-20 (×7): 325 mg via ORAL
  Filled 2022-03-13 (×7): qty 1

## 2022-03-13 MED ORDER — INSULIN ASPART 100 UNIT/ML IJ SOLN
0.0000 [IU] | Freq: Three times a day (TID) | INTRAMUSCULAR | Status: DC
  Administered 2022-03-14 – 2022-03-15 (×3): 3 [IU] via SUBCUTANEOUS
  Administered 2022-03-16: 4 [IU] via SUBCUTANEOUS
  Administered 2022-03-17: 3 [IU] via SUBCUTANEOUS
  Administered 2022-03-17: 7 [IU] via SUBCUTANEOUS
  Administered 2022-03-17 – 2022-03-20 (×6): 3 [IU] via SUBCUTANEOUS
  Filled 2022-03-13: qty 0.2

## 2022-03-13 MED ORDER — SODIUM CHLORIDE 0.9 % IV BOLUS (SEPSIS)
500.0000 mL | Freq: Once | INTRAVENOUS | Status: AC
  Administered 2022-03-13: 500 mL via INTRAVENOUS

## 2022-03-13 MED ORDER — LACTATED RINGERS IV SOLN: INTRAVENOUS | Status: AC

## 2022-03-13 MED ORDER — VANCOMYCIN HCL 1500 MG/300ML IV SOLN
1500.0000 mg | INTRAVENOUS | Status: DC
  Filled 2022-03-13: qty 300

## 2022-03-13 MED ORDER — DAPAGLIFLOZIN PROPANEDIOL 10 MG PO TABS
10.0000 mg | ORAL_TABLET | Freq: Every day | ORAL | Status: DC
  Administered 2022-03-14 – 2022-03-20 (×7): 10 mg via ORAL
  Filled 2022-03-13 (×7): qty 1

## 2022-03-13 MED ORDER — VANCOMYCIN HCL 2000 MG/400ML IV SOLN
2000.0000 mg | Freq: Once | INTRAVENOUS | Status: AC
  Administered 2022-03-13: 2000 mg via INTRAVENOUS
  Filled 2022-03-13: qty 400

## 2022-03-13 MED ORDER — ACETAMINOPHEN 650 MG RE SUPP: 650.0000 mg | Freq: Four times a day (QID) | RECTAL | Status: DC | PRN

## 2022-03-13 MED ORDER — SODIUM CHLORIDE 0.9 % IV SOLN
2.0000 g | INTRAVENOUS | Status: DC
  Administered 2022-03-13: 2 g via INTRAVENOUS
  Filled 2022-03-13: qty 20

## 2022-03-13 NOTE — ED Notes (Signed)
Pt transported to radiology.

## 2022-03-13 NOTE — Progress Notes (Signed)
Right lower extremity venous duplex completed. Refer to "CV Proc" under chart review to view preliminary results.  03/13/2022 2:27 PM Eula Fried., MHA, RVT, RDCS, RDMS

## 2022-03-13 NOTE — Discharge Instructions (Addendum)
-  Please head to the emergency department for further evaluation of your lightheadedness and possible urinary symptoms.  I am concerned that you have an infection in your blood, you need lab work, IV fluids, possibly IV antibiotics. -If symptoms change on the way, including lightheadedness, chest pain, shortness of breath-stop and call 911 immediately.

## 2022-03-13 NOTE — Progress Notes (Signed)
   03/13/22 1613  Assess: MEWS Score  Temp (!) 101.7 F (38.7 C)  BP (!) 144/88  MAP (mmHg) 104  Pulse Rate (!) 101  Resp 20  SpO2 94 %  O2 Device Room Air  Assess: MEWS Score  MEWS Temp 2  MEWS Systolic 0  MEWS Pulse 1  MEWS RR 0  MEWS LOC 0  MEWS Score 3  MEWS Score Color Yellow  Assess: if the MEWS score is Yellow or Red  Were vital signs taken at a resting state? Yes  Focused Assessment No change from prior assessment  Does the patient meet 2 or more of the SIRS criteria? Yes  Does the patient have a confirmed or suspected source of infection? Yes  Provider and Rapid Response Notified? Yes  MEWS guidelines implemented *See Row Information* Yes  Take Vital Signs  Increase Vital Sign Frequency  Yellow: Q 2hr X 2 then Q 4hr X 2, if remains yellow, continue Q 4hrs  Escalate  MEWS: Escalate Yellow: discuss with charge nurse/RN and consider discussing with provider and RRT  Notify: Charge Nurse/RN  Name of Charge Nurse/RN Notified Jessica, RN  Date Charge Nurse/RN Notified 03/13/22  Time Charge Nurse/RN Notified 1630  Notify: Provider  Provider Name/Title Robb Matar, MD  Date Provider Notified 03/13/22  Time Provider Notified 1630  Method of Notification Face-to-face  Notification Reason Change in status  Provider response At bedside  Date of Provider Response 03/13/22  Time of Provider Response 1700  Document  Progress note created (see row info) Yes  Assess: SIRS CRITERIA  SIRS Temperature  1  SIRS Pulse 1  SIRS Respirations  0  SIRS WBC 0  SIRS Score Sum  2   MD notified. PRN Tylenol administered. Pt is in no acute distress. Will continue to monitor

## 2022-03-13 NOTE — Progress Notes (Signed)
A consult was received from an ED physician for vancomycin per pharmacy dosing.  The patient's profile has been reviewed for ht/wt/allergies/indication/available labs.    A one time order has been placed for vancomycin 2 g IV.    Further antibiotics/pharmacy consults should be ordered by admitting physician if indicated.                       Thank you, Lynden Ang, PharmD, BCPS 03/13/2022  12:25 PM

## 2022-03-13 NOTE — Progress Notes (Signed)
Pharmacy Antibiotic Note  Jason Melendez is a 66 y.o. male admitted on 03/13/2022 with cellulitis.  Pharmacy has been consulted for vancomycin dosing.  Plan: Vancomycin 2g IV x 1 in ED, then 1500mg  IV q24h for 7 days total.  Estimated AUC 492 using SCr 1.53, Vd 0.5 Follow up renal function & cultures  Height: 6\' 2"  (188 cm) Weight: 119.7 kg (264 lb) IBW/kg (Calculated) : 82.2  Temp (24hrs), Avg:99.3 F (37.4 C), Min:97.4 F (36.3 C), Max:102.9 F (39.4 C)  Recent Labs  Lab 03/12/22 1623 03/13/22 1104 03/13/22 1259  WBC 12.3* 10.7*  --   CREATININE 1.13 1.53*  --   LATICACIDVEN 2.1* 4.4* 3.8*    Estimated Creatinine Clearance: 66.2 mL/min (A) (by C-G formula based on SCr of 1.53 mg/dL (H)).    Allergies  Allergen Reactions   Hydralazine Anaphylaxis and Other (See Comments)    And sudden drop in B/P    Antimicrobials this admission: 7/14 CTX >> (7/20) 7/14 Vancomycin >> (7/20)  Dose adjustments this admission:  Microbiology results: 7/14 BCx: 7/14 UCx:  Thank you for allowing pharmacy to be a part of this patient's care.  8/14, PharmD, BCPS Pharmacy: [phone] 03/13/2022 2:41 PM

## 2022-03-13 NOTE — ED Notes (Signed)
Patient had swelling and bruising around site of bloof draw in hand applied ice.

## 2022-03-13 NOTE — ED Triage Notes (Signed)
Patient states he was at he [facility] this AM and his BP was 88/53 and then it began climbing. BP in triage 116/68.  Patient states he saw his cardiologist 2 weeks ago and did not get any med adjustments.

## 2022-03-13 NOTE — ED Triage Notes (Signed)
Pt c/o urinary frequency, urgency, fever at home,

## 2022-03-13 NOTE — ED Provider Notes (Signed)
[facility]-EMERGENCY DEPT Provider Note   CSN: [id number] Arrival date & time: 03/13/22  1030     History  Chief Complaint  Patient presents with   Hypotension    Jason Melendez is a 66 y.o. male.  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  He states he became acutely ill yesterday while he was working.  He had profound fatigue, chills, diaphoresis and "incoherence" did not know what was going on.  He had a fever up to 103.  He had urinary frequency and urgency and difficulty making to the bathroom on time.  [facility] but left without being seen officially.  Urinalysis did not show gross infection.  At urgent care this morning he was found to have a blood pressure 88/53 and sent to the ED.  He states he is feeling much improved since this morning denies any dizziness or lightheadedness.  When he woke up this morning he states he had problems with his vision and "everything that was gray was white" this lasted for about 45 minutes and is now resolved.  Denies seeing any black spots.  No double vision.  No significant headache, cough, runny nose or sore throat.  No chest pain or shortness of breath.  No abdominal pain or flank pain.  Denies any pain with urination or blood in the urine.  Has not had a fever since last night.  Complaining of some pain to his right foot but believes this is from a new shoe.  The history is provided by the patient and the spouse.       Home Medications Prior to Admission medications   Medication Sig Start Date End Date Taking? Authorizing Provider  ACCU-CHEK FASTCLIX LANCETS MISC USE TO CHECK BLOOD SUGARS ONCE DAILY AS DIRECTED 07/01/18   Doreene Nestlark, Katherine K, NP  ACCU-CHEK GUIDE test strip USE TO CHECK BLOOD SUGARS ONCE DAILY AS DIRECTED 03/05/20   Doreene Nestlark, Katherine K, NP   acetaminophen (TYLENOL) 650 MG CR tablet Take 650 mg by mouth 2 (two) times daily.    [provider]  aspirin 325 MG tablet Take 1 tablet (325 mg total) by mouth daily. 10/19/16   Elgergawy, Leana Roeawood S, MD  atorvastatin (LIPITOR) 40 MG tablet Take 1 tablet (40 mg total) by mouth daily. For cholesterol. 08/05/21   Doreene Nestlark, Katherine K, NP  carvedilol (COREG) 12.5 MG tablet Take 1 tablet (12.5 mg total) by mouth in the morning and at bedtime. Please keep upcoming appointment in June 2023 for future refills. Thank you 02/16/22   Lyn RecordsSmith, Henry W, MD  dapagliflozin propanediol (FARXIGA) 10 MG TABS tablet Take 1 tablet (10 mg total) by mouth daily before breakfast. 09/23/21   Alver SorrowWalker, Caitlin S, NP  escitalopram (LEXAPRO) 10 MG tablet TAKE 1 TABLET (10 MG TOTAL) BY MOUTH DAILY. FOR ANXIETY. 11/25/21   Doreene Nestlark, Katherine K, NP  furosemide (LASIX) 20 MG tablet TAKE 1 TABLET BY MOUTH EVERY DAY AS NEEDED FOR FLUID/EDEMA (WEIGHT GAIN 3+ LBS OVERNIGHT) 08/27/21   Lyn RecordsSmith, Henry W, MD  metFORMIN (GLUCOPHAGE) 1000 MG tablet Take 1 tablet (1,000 mg total) by mouth 2 (two) times daily with a meal. For diabetes. 12/08/21   Doreene Nestlark, Katherine K, NP  sacubitril-valsartan (ENTRESTO) 49-51 MG TAKE 1 TABLET 2 TIMES DAILY. 10/20/21   Alver SorrowWalker, Caitlin S, NP  spironolactone (ALDACTONE) 25 MG tablet Take 1 tablet (  25 mg total) by mouth daily. 10/29/21   Lyn Records, MD      Allergies    Hydralazine and Hydralazine hcl    Review of Systems   Review of Systems  Constitutional:  Positive for activity change, appetite change, fatigue and fever.  HENT:  Negative for congestion.   Respiratory:  Negative for cough, chest tightness and shortness of breath.   Cardiovascular:  Negative for chest pain.  Gastrointestinal:  Negative for abdominal pain, nausea and vomiting.  Genitourinary:  Positive for dysuria, frequency and urgency.  Musculoskeletal:  Positive for back pain.  Skin:  Negative for wound.  Neurological:  Positive for  dizziness, weakness and light-headedness.   all other systems are negative except as noted in the HPI and PMH.    Physical Exam Updated Vital Signs BP 116/68 (BP Location: Left Arm)   Pulse 94   Temp 98.3 F (36.8 C) (Oral)   Resp 18   Ht 6\' 2"  (1.88 m)   Wt 119.7 kg   SpO2 97%   BMI 33.90 kg/m  Physical Exam Vitals and nursing note reviewed.  Constitutional:      General: He is not in acute distress.    Appearance: He is well-developed. He is not ill-appearing.  HENT:     Head: Normocephalic and atraumatic.     Mouth/Throat:     Pharynx: No oropharyngeal exudate.  Eyes:     Conjunctiva/sclera: Conjunctivae normal.     Pupils: Pupils are equal, round, and reactive to light.  Neck:     Comments: No meningismus. Cardiovascular:     Rate and Rhythm: Normal rate and regular rhythm.     Heart sounds: Normal heart sounds. No murmur heard. Pulmonary:     Effort: Pulmonary effort is normal. No respiratory distress.     Breath sounds: Normal breath sounds.  Chest:     Chest wall: No tenderness.  Abdominal:     Palpations: Abdomen is soft.     Tenderness: There is no abdominal tenderness. There is no guarding or rebound.  Genitourinary:    Comments: Streaky erythema to right anterior groin with palpable lymph nodes.  Testicles are nontender Musculoskeletal:        General: No tenderness. Normal range of motion.     Cervical back: Normal range of motion and neck supple.     Comments: No CVAT  Skin:    General: Skin is warm.  Neurological:     Mental Status: He is alert and oriented to person, place, and time.     Cranial Nerves: No cranial nerve deficit.     Motor: No abnormal muscle tone.     Coordination: Coordination normal.     Comments:  5/5 strength throughout. CN 2-12 intact.Equal grip strength.   Psychiatric:        Behavior: Behavior normal.     ED Results / Procedures / Treatments   Labs (all labs ordered are listed, but only abnormal results are  displayed) Labs Reviewed  LACTIC ACID, PLASMA - Abnormal; Notable for the following components:      Result Value   Lactic Acid, Venous 4.4 (*)    All other components within normal limits  LACTIC ACID, PLASMA - Abnormal; Notable for the following components:   Lactic Acid, Venous 3.8 (*)    All other components within normal limits  COMPREHENSIVE METABOLIC PANEL - Abnormal; Notable for the following components:   CO2 20 (*)    Glucose, Bld 242 (*)  Creatinine, Ser 1.53 (*)    Alkaline Phosphatase 27 (*)    Total Bilirubin 1.4 (*)    GFR, Estimated 50 (*)    All other components within normal limits  CBC WITH DIFFERENTIAL/PLATELET - Abnormal; Notable for the following components:   WBC 10.7 (*)    Platelets 146 (*)    Neutro Abs 9.2 (*)    Abs Immature Granulocytes 0.09 (*)    All other components within normal limits  URINALYSIS, ROUTINE W REFLEX MICROSCOPIC - Abnormal; Notable for the following components:   Specific Gravity, Urine 1.032 (*)    Glucose, UA >=500 (*)    Bacteria, UA RARE (*)    All other components within normal limits  GLUCOSE, CAPILLARY - Abnormal; Notable for the following components:   Glucose-Capillary 113 (*)    All other components within normal limits  RESP PANEL BY RT-PCR (FLU A&B, COVID) ARPGX2  CULTURE, BLOOD (ROUTINE X 2)  CULTURE, BLOOD (ROUTINE X 2)  URINE CULTURE  PROTIME-INR  APTT  TROPONIN I (HIGH SENSITIVITY)  TROPONIN I (HIGH SENSITIVITY)    EKG EKG Interpretation  Date/Time:  Friday March 13 2022 11:10:08 EDT Ventricular Rate:  93 PR Interval:  162 QRS Duration: 91 QT Interval:  339 QTC Calculation: 422 R Axis:   66 Text Interpretation: Sinus rhythm Ventricular premature complex No significant change was found Confirmed by Glynn Octave [phone]) on 03/13/2022 12:03:10 PM  Radiology CT Renal Stone Study  Result Date: 03/13/2022 CLINICAL DATA:  A 66 year old male presents for evaluation of flank pain, suspected kidney  stones. EXAM: CT ABDOMEN AND PELVIS WITHOUT CONTRAST TECHNIQUE: Multidetector CT imaging of the abdomen and pelvis was performed following the standard protocol without IV contrast. RADIATION DOSE REDUCTION: This exam was performed according to the departmental dose-optimization program which includes automated exposure control, adjustment of the mA and/or kV according to patient size and/or use of iterative reconstruction technique. COMPARISON:  August of 2020, chest CT. No recent relevant priors for comparison. FINDINGS: Lower chest: Incidental imaging of the lung bases with mild basilar atelectasis. Hepatobiliary: Marked hepatic steatosis. Smooth hepatic contours. No pericholecystic stranding. No signs of biliary duct dilation. Pancreas: Pancreatic contours are smooth. No signs of pancreatic inflammation. Spleen: Normal size and contour. Adrenals/Urinary Tract: Adrenal glands are normal. Smooth renal contours without hydronephrosis. Mild perinephric stranding is noted bilaterally perhaps slightly worse on the LEFT than the RIGHT. Nephrolithiasis in the bilateral kidneys, 4 mm on the LEFT in the interpolar LEFT kidney. 2 mm on the RIGHT in the lower pole of the RIGHT kidney. No signs of ureteral calculus. No substantial perivesical stranding. No hydronephrosis. Stomach/Bowel: No stranding adjacent to the stomach or small bowel. No sign of small bowel obstruction. The appendix is normal. No pericolonic stranding or signs of colonic inflammation or obstruction. Vascular/Lymphatic: Aortic atherosclerosis. No sign of aneurysm. Smooth contour of the IVC. There is no gastrohepatic or hepatoduodenal ligament lymphadenopathy. No retroperitoneal or mesenteric lymphadenopathy. No pelvic sidewall lymphadenopathy. Atherosclerotic changes are mild in terms of calcified changes. Study limited with respect to vascular assessment due to lack of intravenous contrast. RIGHT groin adenopathy and mild stranding in the RIGHT groin  (image 99/2) 16 mm lymph node. Mild stranding in the adjacent fat in this area. Reproductive: Unremarkable by CT. Other: No ascites.  No pneumoperitoneum. Musculoskeletal: No acute bone finding. No destructive bone process. Spinal degenerative changes. IMPRESSION: 1. Stranding in the RIGHT groin with enlarged RIGHT groin lymph nodes, nonspecific but would correlate with any signs  of cellulitis and side and site of symptoms. In the absence of obvious inflammation or explanation for reactive lymph nodes would suggest focused ultrasound evaluation and correlation with any history of malignancy as enlargement could be seen in the setting of neoplasm. If there are signs of inflammation in this area clinically, short interval follow-up ultrasound is suggested to ensure resolution. 2. Bilateral perinephric stranding is noted bilaterally perhaps slightly worse on the LEFT than the RIGHT. This finding can be seen in the setting of urinary tract infection but could also be chronic. Correlation with urinalysis is recommended. 3. Bilateral nonobstructing nephrolithiasis, no ureteral calculi. 4. Marked hepatic steatosis. 5. Aortic atherosclerosis. Aortic Atherosclerosis (ICD10-I70.0). Electronically Signed   By: Donzetta Kohut M.D.   On: 03/13/2022 13:06   CT Head Wo Contrast  Result Date: 03/13/2022 CLINICAL DATA:  Neuro deficit, acute, stroke suspected EXAM: CT HEAD WITHOUT CONTRAST TECHNIQUE: Contiguous axial images were obtained from the base of the skull through the vertex without intravenous contrast. RADIATION DOSE REDUCTION: This exam was performed according to the departmental dose-optimization program which includes automated exposure control, adjustment of the mA and/or kV according to patient size and/or use of iterative reconstruction technique. COMPARISON:  10/12/2016, 10/13/2016 FINDINGS: Brain: No evidence of acute infarction, hemorrhage, hydrocephalus, extra-axial collection or mass lesion/mass effect.  Remote left ACA territory infarct again seen. Mild age related cerebral volume loss. Vascular: Atherosclerotic calcifications involving the large vessels of the skull base. No unexpected hyperdense vessel. Skull: Normal. Negative for fracture or focal lesion. Sinuses/Orbits: No acute finding. Other: None. IMPRESSION: 1. No acute intracranial abnormality. 2. Remote left ACA territory infarct. Electronically Signed   By: Duanne Guess D.O.   On: 03/13/2022 12:50   DG Chest Port 1 View  Result Date: 03/13/2022 CLINICAL DATA:  Questionable sepsis. EXAM: PORTABLE CHEST 1 VIEW COMPARISON:  March 30, 2018. FINDINGS: EKG leads project over the chest. Trachea midline. Cardiomediastinal contours and hilar structures are normal. Lungs are clear. No pneumothorax. On limited assessment there is no acute skeletal process. IMPRESSION: No acute cardiopulmonary disease. Electronically Signed   By: Donzetta Kohut M.D.   On: 03/13/2022 11:21    Procedures .Critical Care  Performed by: Glynn Octave, MD Authorized by: Glynn Octave, MD   Critical care provider statement:    Critical care time (minutes):  45   Critical care time was exclusive of:  Separately billable procedures and treating other patients   Critical care was necessary to treat or prevent imminent or life-threatening deterioration of the following conditions:  Sepsis   Critical care was time spent personally by me on the following activities:  Development of treatment plan with patient or surrogate, discussions with consultants, evaluation of patient's response to treatment, examination of patient, ordering and review of laboratory studies, ordering and review of radiographic studies, ordering and performing treatments and interventions, pulse oximetry, re-evaluation of patient's condition and review of old charts   I assumed direction of critical care for this patient from another provider in my specialty: no     Care discussed with: admitting  provider       Medications Ordered in ED Medications  sodium chloride 0.9 % bolus 500 mL (has no administration in time range)  cefTRIAXone (ROCEPHIN) 2 g in sodium chloride 0.9 % 100 mL IVPB (has no administration in time range)    ED Course/ Medical Decision Making/ A&P  Medical Decision Making Amount and/or Complexity of Data Reviewed Independent Historian: spouse Labs: ordered. Radiology: ordered and independent interpretation performed. Decision-making details documented in ED Course. ECG/medicine tests: ordered and independent interpretation performed. Decision-making details documented in ED Course.  Risk Prescription drug management. Decision regarding hospitalization.   Low blood pressure with generalized fatigue, weakness, fever, urgency, frequency and dysuria.  Neurological exam is nonfocal  Patient appears nontoxic.  There is some concern for sepsis given his fatigue, urinary symptoms, hypotension and dizziness.  Also did have brief episode of vision changes which is since resolved.  Sepsis protocol pursued.  Patient given IV fluids and IV antibiotics after cultures were obtained.  Urinalysis from yesterday did not show gross infection.  Lactate is 4.4.  Does have acute renal failure at 1.5  Unclear etiology of patient's sepsis.  Likely urinary source.  Does have apparent early cellulitis in the right groin.  Patient started on IV antibiotics after cultures are obtained.  CT scan shows stranding in the right groin of nonspecific etiology.  Also has fullness of the collecting systems bilaterally without discrete ureteral stone  Patient does have an area of erythema in the right groin region of concern.  Will obtain ultrasound for further evaluation.  Question early cellulitis versus other pathology with this lymph node.  Still favor sepsis likely secondary to urinary tract infection and/or cellulitis.  Blood pressure is normal.  Initial  lactate was 4.4.  Giving fluids judiciously given patient's reduced ejection fraction.  He looks well and nontoxic.  Unclear etiology of his visual changes this morning but CT head is reassuring and negative.  This has since resolved. Patient may need to have MRI regarding his visual changes to rule out TIA.  Ultrasound is negative for DVT.  Does show enlarged lymph nodes in the right groin of uncertain etiology. Admission discussed with Dr. Robb Matar        Final Clinical Impression(s) / ED Diagnoses Final diagnoses:  Sepsis with acute renal failure without septic shock, due to unspecified organism, unspecified acute renal failure type (HCC)  Lactic acidosis    Rx / DC Orders ED Discharge Orders     None         Glynn Octave, MD 03/13/22 1625

## 2022-03-13 NOTE — Sepsis Progress Note (Signed)
Sepsis protocol monitored by eLink 

## 2022-03-13 NOTE — ED Provider Notes (Addendum)
[facility]    CSN: [id number] Arrival date & time: 03/13/22  0854      History   Chief Complaint Chief Complaint  Patient presents with   possible sepsis    HPI Jason Melendez is a 66 y.o. male presenting with urinary symptoms and hypotension.  History of diabetes, hypertension, hyperlipidemia, CVA, cardiopulmonary arrest.  He presented to the emergency department 1 day ago with diarrhea, nausea, weakness, urinary symptoms (incontinence, frequency, urgency).  Apparently they started the work-up while he was in the waiting room, the patient became frustrated with the wait and so left.  Apparently the triage nurse strongly encouraged him to say, but he proceeded to leave AMA.  He is now presenting following about 1 day of symptoms, wife is also present today.  States the lightheadedness has actually improved, and there is no chest pain or shortness of breath.  No current urinary symptoms or back/flank pain.  Endorses malaise and fatigue.  Taking his medications as directed, but has not had anything to eat yet today.  HPI  Past Medical History:  Diagnosis Date   Cardiopulmonary arrest (HCC)    Diabetes mellitus without complication (HCC)    HTN (hypertension)    Hyperlipidemia    Stroke [facility])     Patient Active Problem List   Diagnosis Date Noted   Chronic knee pain 07/18/2021   Chronic pain of lower extremity, bilateral 05/08/2020   Breast mass in male 11/01/2019   Syncope 03/30/2018   Hypertension 12/30/2017   Chronic combined systolic and diastolic heart failure (HCC) 11/16/2017   Preventative health care 01/22/2017   Neurocardiogenic syncope 11/12/2016   Type 2 diabetes mellitus (HCC) 10/27/2016   GAD (generalized anxiety disorder) 10/27/2016   Stenosis of left carotid artery    History of CVA (cerebrovascular accident) 10/13/2016   Hypertensive heart disease without CHF 10/13/2016   HLD (hyperlipidemia) 10/13/2016   Obesity (BMI 30.0-34.9)      Past Surgical History:  Procedure Laterality Date   ENDARTERECTOMY Left 10/16/2016   Procedure: ENDARTERECTOMY CAROTID;  Surgeon: Nada Libman, MD;  Location: [facility] OR;  Service: Vascular;  Laterality: Left;   HERNIA REPAIR     PATCH ANGIOPLASTY Left 10/16/2016   Procedure: PATCH ANGIOPLASTY USING Livia Snellen BIOLOGIC PATCH;  Surgeon: Nada Libman, MD;  Location: [facility] OR;  Service: Vascular;  Laterality: Left;       Home Medications    Prior to Admission medications   Medication Sig Start Date End Date Taking? Authorizing Provider  ACCU-CHEK FASTCLIX LANCETS MISC USE TO CHECK BLOOD SUGARS ONCE DAILY AS DIRECTED 07/01/18   Doreene Nest, NP  ACCU-CHEK GUIDE test strip USE TO CHECK BLOOD SUGARS ONCE DAILY AS DIRECTED 03/05/20   Doreene Nest, NP  acetaminophen (TYLENOL) 650 MG CR tablet Take 650 mg by mouth 2 (two) times daily.    [provider]  aspirin 325 MG tablet Take 1 tablet (325 mg total) by mouth daily. 10/19/16   Elgergawy, Leana Roe, MD  atorvastatin (LIPITOR) 40 MG tablet Take 1 tablet (40 mg total) by mouth daily. For cholesterol. 08/05/21   Doreene Nest, NP  carvedilol (COREG) 12.5 MG tablet Take 1 tablet (12.5 mg total) by mouth in the morning and at bedtime. Please keep upcoming appointment in June 2023 for future refills. Thank you 02/16/22   Lyn Records, MD  dapagliflozin propanediol (FARXIGA) 10 MG TABS tablet Take 1 tablet (10 mg total) by mouth daily before breakfast.  09/23/21   Alver Sorrow, NP  escitalopram (LEXAPRO) 10 MG tablet TAKE 1 TABLET (10 MG TOTAL) BY MOUTH DAILY. FOR ANXIETY. 11/25/21   Doreene Nest, NP  furosemide (LASIX) 20 MG tablet TAKE 1 TABLET BY MOUTH EVERY DAY AS NEEDED FOR FLUID/EDEMA (WEIGHT GAIN 3+ LBS OVERNIGHT) 08/27/21   Lyn Records, MD  metFORMIN (GLUCOPHAGE) 1000 MG tablet Take 1 tablet (1,000 mg total) by mouth 2 (two) times daily with a meal. For diabetes. 12/08/21   Doreene Nest, NP   sacubitril-valsartan (ENTRESTO) 49-51 MG TAKE 1 TABLET 2 TIMES DAILY. 10/20/21   Alver Sorrow, NP  spironolactone (ALDACTONE) 25 MG tablet Take 1 tablet (25 mg total) by mouth daily. 10/29/21   Lyn Records, MD    Family History Family History  Problem Relation Age of Onset   Diabetes Mother    Heart disease Father 28       CABG   Diabetes Father    Kidney disease Father    Kidney failure Father    Hypertension Sister     Social History Social History   Tobacco Use   Smoking status: Never   Smokeless tobacco: Never  Vaping Use   Vaping Use: Never used  Substance Use Topics   Alcohol use: No   Drug use: No     Allergies   Hydralazine and Hydralazine hcl   Review of Systems Review of Systems  Constitutional:  Positive for fatigue.  All other systems reviewed and are negative.    Physical Exam Triage Vital Signs ED Triage Vitals  Enc Vitals Group     BP 03/13/22 0935 (!) 88/53     Pulse Rate 03/13/22 0935 85     Resp 03/13/22 0935 18     Temp 03/13/22 0935 (!) 97.4 F (36.3 C)     Temp Source 03/13/22 0935 Oral     SpO2 03/13/22 0935 93 %     Weight --      Height --      Head Circumference --      Peak Flow --      Pain Score 03/13/22 0936 0     Pain Loc --      Pain Edu? --      Excl. in GC? --    No data found.  Updated Vital Signs BP 99/61 (BP Location: Right Arm)   Pulse 85   Temp 97.8 F (36.6 C) (Oral)   Resp 18   SpO2 93%   Visual Acuity Right Eye Distance:   Left Eye Distance:   Bilateral Distance:    Right Eye Near:   Left Eye Near:    Bilateral Near:     Physical Exam Vitals reviewed.  Constitutional:      Appearance: Normal appearance. He is not diaphoretic.  HENT:     Head: Normocephalic and atraumatic.     Mouth/Throat:     Mouth: Mucous membranes are moist.  Eyes:     Extraocular Movements: Extraocular movements intact.     Pupils: Pupils are equal, round, and reactive to light.  Cardiovascular:     Rate and  Rhythm: Normal rate and regular rhythm.     Pulses:          Radial pulses are 2+ on the right side and 2+ on the left side.     Heart sounds: Normal heart sounds.  Pulmonary:     Effort: Pulmonary effort is normal.  Breath sounds: Normal breath sounds.  Abdominal:     Palpations: Abdomen is soft.     Tenderness: There is no abdominal tenderness. There is no guarding or rebound.  Musculoskeletal:     Right lower leg: No edema.     Left lower leg: No edema.  Skin:    General: Skin is warm.     Capillary Refill: Capillary refill takes less than 2 seconds.  Neurological:     General: No focal deficit present.     Mental Status: He is alert and oriented to person, place, and time.     Comments: CN 2-12 grossly intact, PERRLA, EOMI. Strength and sensation grossly intact. Gait intact.   Psychiatric:        Mood and Affect: Mood normal.        Behavior: Behavior normal.        Thought Content: Thought content normal.        Judgment: Judgment normal.      UC Treatments / Results  Labs (all labs ordered are listed, but only abnormal results are displayed) Labs Reviewed - No data to display  EKG   Radiology No results found.  Procedures Procedures (including critical care time)  Medications Ordered in UC Medications - No data to display  Initial Impression / Assessment and Plan / UC Course  I have reviewed the triage vital signs and the nursing notes.  Pertinent labs & imaging results that were available during my care of the patient were reviewed by me and considered in my medical decision making (see chart for details).     This patient is a 66 y.o. year old male presenting with malaise and borderline hypotension. BP initially 88/53, up to 99/61 on recheck. Afebrile, nontachy. No obvious source of infection though he did have urinary symptoms last night.  I have concern for sepsis given presentation and recent urinary symptoms.  Discussed DDx with patient and wife,  they are in agreement to proceed to the emergency department once again.  After extensive discussion, wife plans to drive the patient via POV, they declined transport via EMS.  They verbalized multiple times that they will head straight to the emergency department, and will stop and call 911 if symptoms change or worsen on the way.   Level 5 as he was admitted.   Final Clinical Impressions(s) / UC Diagnoses   Final diagnoses:  Hypotension, unspecified hypotension type     Discharge Instructions      -Please head to the emergency department for further evaluation of your lightheadedness and possible urinary symptoms.  I am concerned that you have an infection in your blood, you need lab work, IV fluids, possibly IV antibiotics. -If symptoms change on the way, including lightheadedness, chest pain, shortness of breath-stop and call 911 immediately.   ED Prescriptions   None    PDMP not reviewed this encounter.   Rhys Martini, PA-C 03/13/22 1021    Rhys Martini, PA-C 03/13/22 1829

## 2022-03-13 NOTE — H&P (Signed)
History and Physical    Patient: Jason Melendez WGN:[id number] DOB: February 10, 1956 DOA: 03/13/2022 DOS: the patient was seen and examined on 03/13/2022 PCP: Doreene Nest, NP  Patient coming from: Home  Chief Complaint:  Chief Complaint  Patient presents with   Hypotension   HPI: Jason Melendez is a 66 y.o. male with medical history significant of class I obesity, cardiopulmonary arrest, type II DM, hypertension, hyperlipidemia, history of other nonhemorrhagic CVA who was sent from the urgent care center earlier due to hypotension.  He initially went to the urgent care center due to urinary urgency and frequency with no dysuria but also had a fever with profuse night sweats last night.  He took a temperature at home and was 74 F.  When he got up his position was blurry and grayish.  This took about 45 minutes for it to resolve.  He has been feeling sick since yesterday and was unable to drive himself home.  His appetite is decreased.  He tried to be seen at [facility] yesterday but left before he was seen.  He did not notice that he had redness and swelling on his RLE until he was pointed out by the ER staff this morning.no history of trauma to that extremity.  He denied rhinorrhea, sore throat, wheezing or hemoptysis.  No chest pain, palpitations, diaphoresis, PND, orthopnea or pitting edema of the lower extremities.  No abdominal pain, nausea, emesis, diarrhea, constipation, melena or hematochezia.  No flank pain, dysuria, frequency or hematuria.  No polyuria, polydipsia, polyphagia or blurred vision.   ED course: Initial vital signs were temperature 98.3 F, pulse 94, respiration 18, BP 116/68 mmHg O2 sat 97% on room air.  He received 500 mL of NS bolus x3, ceftriaxone and vancomycin in the emergency department.  Lab work: His urinalysis showed glucosuria more 500 mg/dL with increase of specific gravity and rare bacteria, but otherwise normal.  Coronavirus by PCR was negative.   Lactic acid 4.4, then 3.8 then 1.5 mmol/L.  CBC*white count 10.7 with 86% neutrophils, hemoglobin 13.8 g/dL platelets 784.  Normal PT, INR and PTT.  Troponin x2 was negative.  CMP showed a CO2 of 20 mmol/L, but all other electrolytes were normal.  BUN was 21, glucose 242 and creatinine 1.53 mg/dL.  LFTs with an alkaline phosphatase of 27 U/L and total bilirubin 1.4 mg/dL.  The rest of the hepatic functions were normal.  Imaging: No acute cardiopulmonary disease on portable chest radiograph.  CT head without contrast with no acute intracranial normality.  CT renal study with stranding in the right groin with a large right groin lymph nodes, nonspecific but may be due to cellulitis.  There is bilateral perinephric stranding.  There was bilateral nonobstructing nephrolithiasis.  There is marked hepatic asteatosis 6.  There was aortic atherosclerosis.  No DVT was seen preliminary on Doppler ultrasound.   Review of Systems: As mentioned in the history of present illness. All other systems reviewed and are negative.  Past Medical History:  Diagnosis Date   Cardiopulmonary arrest (HCC)    Diabetes mellitus without complication (HCC)    HTN (hypertension)    Hyperlipidemia    Stroke [facility])    Past Surgical History:  Procedure Laterality Date   ENDARTERECTOMY Left 10/16/2016   Procedure: ENDARTERECTOMY CAROTID;  Surgeon: Nada Libman, MD;  Location: [facility] OR;  Service: Vascular;  Laterality: Left;   HERNIA REPAIR     PATCH ANGIOPLASTY Left 10/16/2016   Procedure:  PATCH ANGIOPLASTY USING XENOSURE BIOLOGIC PATCH;  Surgeon: Nada Libman, MD;  Location: [facility] OR;  Service: Vascular;  Laterality: Left;   Social History:  reports that he has never smoked. He has never used smokeless tobacco. He reports that he does not drink alcohol and does not use drugs.  Allergies  Allergen Reactions   Hydralazine Anaphylaxis and Other (See Comments)    THE PATIENT CODED BECAUSE OF A SUDDEN DROP IN B/P!!!! HAD TO RECEIVE  CPR!!    Family History  Problem Relation Age of Onset   Diabetes Mother    Heart disease Father 59       CABG   Diabetes Father    Kidney disease Father    Kidney failure Father    Diabetes Sister    Hypertension Sister     Prior to Admission medications   Medication Sig Start Date End Date Taking? Authorizing Provider  acetaminophen (TYLENOL) 650 MG CR tablet Take 650 mg by mouth 2 (two) times daily.   Yes [provider]  aspirin 325 MG tablet Take 1 tablet (325 mg total) by mouth daily. 10/19/16  Yes Elgergawy, Leana Roe, MD  atorvastatin (LIPITOR) 40 MG tablet Take 1 tablet (40 mg total) by mouth daily. For cholesterol. Patient taking differently: Take 40 mg by mouth at bedtime. For cholesterol. 08/05/21  Yes Doreene Nest, NP  carvedilol (COREG) 12.5 MG tablet Take 1 tablet (12.5 mg total) by mouth in the morning and at bedtime. Please keep upcoming appointment in June 2023 for future refills. Thank you Patient taking differently: Take 12.5 mg by mouth in the morning and at bedtime. 02/16/22  Yes Lyn Records, MD  dapagliflozin propanediol (FARXIGA) 10 MG TABS tablet Take 1 tablet (10 mg total) by mouth daily before breakfast. 09/23/21  Yes Alver Sorrow, NP  escitalopram (LEXAPRO) 10 MG tablet TAKE 1 TABLET (10 MG TOTAL) BY MOUTH DAILY. FOR ANXIETY. Patient taking differently: Take 10 mg by mouth every evening. For anxiety. 11/25/21  Yes Doreene Nest, NP  metFORMIN (GLUCOPHAGE) 1000 MG tablet Take 1 tablet (1,000 mg total) by mouth 2 (two) times daily with a meal. For diabetes. 12/08/21  Yes Doreene Nest, NP  Misc Natural Products (OSTEO BI-FLEX ADV JOINT SHIELD) TABS Take 1 tablet by mouth 2 (two) times daily.   Yes [provider]  sacubitril-valsartan (ENTRESTO) 49-51 MG TAKE 1 TABLET 2 TIMES DAILY. Patient taking differently: Take 1 tablet by mouth 2 (two) times daily. 10/20/21  Yes Alver Sorrow, NP  spironolactone (ALDACTONE) 25 MG  tablet Take 1 tablet (25 mg total) by mouth daily. 10/29/21  Yes Lyn Records, MD  SYSTANE ULTRA PF 0.4-0.3 % SOLN Place 1 drop into both eyes 2 (two) times daily as needed (for irritation).   Yes [provider]  ACCU-CHEK FASTCLIX LANCETS MISC USE TO CHECK BLOOD SUGARS ONCE DAILY AS DIRECTED 07/01/18   Doreene Nest, NP  ACCU-CHEK GUIDE test strip USE TO CHECK BLOOD SUGARS ONCE DAILY AS DIRECTED 03/05/20   Doreene Nest, NP  furosemide (LASIX) 20 MG tablet TAKE 1 TABLET BY MOUTH EVERY DAY AS NEEDED FOR FLUID/EDEMA (WEIGHT GAIN 3+ LBS OVERNIGHT) Patient not taking: Reported on 03/13/2022 08/27/21   Lyn Records, MD    Physical Exam: Vitals:   03/13/22 1200 03/13/22 1302 03/13/22 1530 03/13/22 1613  BP: 124/67 123/62 (!) 147/64 (!) 144/88  Pulse: 89 88 94 (!) 101  Resp: 17 (!) 21 (!)  28 20  Temp:    (!) 101.7 F (38.7 C)  TempSrc:    Oral  SpO2: 95% 94% 97% 94%  Weight:      Height:       Physical Exam Vitals and nursing note reviewed.  Constitutional:      General: He is awake.     Appearance: Normal appearance. He is obese.  HENT:     Head: Normocephalic.     Mouth/Throat:     Mouth: Mucous membranes are moist.  Eyes:     General: No scleral icterus.    Pupils: Pupils are equal, round, and reactive to light.  Neck:     Vascular: No JVD.  Cardiovascular:     Rate and Rhythm: Normal rate and regular rhythm.     Heart sounds: S1 normal and S2 normal.  Pulmonary:     Effort: Pulmonary effort is normal.     Breath sounds: Normal breath sounds.  Abdominal:     General: Bowel sounds are normal. There is no distension.     Palpations: Abdomen is soft.     Tenderness: There is no right CVA tenderness, left CVA tenderness or rebound.  Musculoskeletal:     Cervical back: Neck supple.     Right lower leg: No edema.     Left lower leg: No edema.  Skin:    General: Skin is warm and dry.     Coloration: Skin is not jaundiced.     Findings: Erythema present. No  petechiae or wound.  Neurological:     General: No focal deficit present.     Mental Status: He is alert and oriented to person, place, and time.  Psychiatric:        Mood and Affect: Mood normal.        Behavior: Behavior normal. Behavior is cooperative.          Data Reviewed:   There are no new results to review at this time.  Assessment and Plan: Principal Problem:   Cellulitis of right lower extremity Causing:   Sepsis due to undetermined organism POA (HCC) Admit to PCU/inpatient. Continue IV fluids. Continue ceftriaxone 2 g every hours.   Continue vancomycin per pharmacy. Follow-up blood culture and sensitivity Follow CBC and CMP in a.m.  Active Problems:   HLD (hyperlipidemia)   Aortic atherosclerosis (HCC) Continue atorvastatin 40 mg p.o. daily.    Type 2 diabetes mellitus (HCC) Carbohydrate modified diet. Continue Farxiga 10 mg with breakfast. Lactic acidosis has resolved, resume metformin. CBG monitoring with RI SS.    Chronic combined systolic and diastolic heart failure (HCC) Continue carvedilol 12.5 mg p.o. twice daily. Continue Entresto twice daily.    Tinea pedis of right foot Topical Lotrimin twice daily.    Hepatic steatosis He does not drink alcohol. Weight loss and lifestyle modifications. Follow-up with primary care provider.    Urolithiasis Asymptomatic at this time.     Advance Care Planning:   Code Status: Full Code   Consults:   Family Communication:   Severity of Illness: The appropriate patient status for this patient is OBSERVATION. Observation status is judged to be reasonable and necessary in order to provide the required intensity of service to ensure the patient's safety. The patient's presenting symptoms, physical exam findings, and initial radiographic and laboratory data in the context of their medical condition is felt to place them at decreased risk for further clinical deterioration. Furthermore, it is anticipated  that the patient will be medically  stable for discharge from the hospital within 2 midnights of admission.   Author: Bobette Mo, MD 03/13/2022 5:44 PM  For on call review [email or web address].   This document was prepared using Dragon voice recognition software and may contain some unintended transcription errors.

## 2022-03-14 DIAGNOSIS — Z8674 Personal history of sudden cardiac arrest: Secondary | ICD-10-CM | POA: Diagnosis not present

## 2022-03-14 DIAGNOSIS — L03115 Cellulitis of right lower limb: Secondary | ICD-10-CM | POA: Diagnosis present

## 2022-03-14 DIAGNOSIS — R350 Frequency of micturition: Secondary | ICD-10-CM | POA: Diagnosis present

## 2022-03-14 DIAGNOSIS — K760 Fatty (change of) liver, not elsewhere classified: Secondary | ICD-10-CM | POA: Diagnosis present

## 2022-03-14 DIAGNOSIS — E872 Acidosis, unspecified: Secondary | ICD-10-CM | POA: Diagnosis present

## 2022-03-14 DIAGNOSIS — Z7984 Long term (current) use of oral hypoglycemic drugs: Secondary | ICD-10-CM | POA: Diagnosis not present

## 2022-03-14 DIAGNOSIS — N179 Acute kidney failure, unspecified: Secondary | ICD-10-CM | POA: Diagnosis present

## 2022-03-14 DIAGNOSIS — E119 Type 2 diabetes mellitus without complications: Secondary | ICD-10-CM | POA: Diagnosis present

## 2022-03-14 DIAGNOSIS — I5042 Chronic combined systolic (congestive) and diastolic (congestive) heart failure: Secondary | ICD-10-CM | POA: Diagnosis present

## 2022-03-14 DIAGNOSIS — I110 Hypertensive heart disease with heart failure: Secondary | ICD-10-CM | POA: Diagnosis present

## 2022-03-14 DIAGNOSIS — R6520 Severe sepsis without septic shock: Secondary | ICD-10-CM | POA: Diagnosis present

## 2022-03-14 DIAGNOSIS — E785 Hyperlipidemia, unspecified: Secondary | ICD-10-CM | POA: Diagnosis present

## 2022-03-14 DIAGNOSIS — I891 Lymphangitis: Secondary | ICD-10-CM | POA: Diagnosis not present

## 2022-03-14 DIAGNOSIS — F32A Depression, unspecified: Secondary | ICD-10-CM | POA: Diagnosis present

## 2022-03-14 DIAGNOSIS — A419 Sepsis, unspecified organism: Secondary | ICD-10-CM | POA: Diagnosis present

## 2022-03-14 DIAGNOSIS — Z79899 Other long term (current) drug therapy: Secondary | ICD-10-CM | POA: Diagnosis not present

## 2022-03-14 DIAGNOSIS — R81 Glycosuria: Secondary | ICD-10-CM | POA: Diagnosis present

## 2022-03-14 DIAGNOSIS — Z888 Allergy status to other drugs, medicaments and biological substances status: Secondary | ICD-10-CM | POA: Diagnosis not present

## 2022-03-14 DIAGNOSIS — R609 Edema, unspecified: Secondary | ICD-10-CM | POA: Diagnosis not present

## 2022-03-14 DIAGNOSIS — Z20822 Contact with and (suspected) exposure to covid-19: Secondary | ICD-10-CM | POA: Diagnosis present

## 2022-03-14 DIAGNOSIS — B353 Tinea pedis: Secondary | ICD-10-CM | POA: Diagnosis present

## 2022-03-14 DIAGNOSIS — Z8673 Personal history of transient ischemic attack (TIA), and cerebral infarction without residual deficits: Secondary | ICD-10-CM | POA: Diagnosis not present

## 2022-03-14 DIAGNOSIS — I700 Atherosclerosis of aorta: Secondary | ICD-10-CM | POA: Diagnosis present

## 2022-03-14 DIAGNOSIS — G9341 Metabolic encephalopathy: Secondary | ICD-10-CM | POA: Diagnosis present

## 2022-03-14 DIAGNOSIS — E669 Obesity, unspecified: Secondary | ICD-10-CM | POA: Diagnosis present

## 2022-03-14 DIAGNOSIS — B351 Tinea unguium: Secondary | ICD-10-CM | POA: Diagnosis present

## 2022-03-14 HISTORY — DX: Sepsis, unspecified organism: A41.9

## 2022-03-14 LAB — COMPREHENSIVE METABOLIC PANEL
ALT: 13 U/L (ref 0–44)
AST: 18 U/L (ref 15–41)
Albumin: 3.1 g/dL — ABNORMAL LOW (ref 3.5–5.0)
Alkaline Phosphatase: 24 U/L — ABNORMAL LOW (ref 38–126)
Anion gap: 9 (ref 5–15)
BUN: 21 mg/dL (ref 8–23)
CO2: 23 mmol/L (ref 22–32)
Calcium: 8.8 mg/dL — ABNORMAL LOW (ref 8.9–10.3)
Chloride: 108 mmol/L (ref 98–111)
Creatinine, Ser: 1 mg/dL (ref 0.61–1.24)
GFR, Estimated: >60 mL/min (ref >60)
Glucose, Bld: 124 mg/dL — ABNORMAL HIGH (ref 70–99)
Potassium: 4.1 mmol/L (ref 3.5–5.1)
Sodium: 140 mmol/L (ref 135–145)
Total Bilirubin: 0.9 mg/dL (ref 0.3–1.2)
Total Protein: 6.2 g/dL — ABNORMAL LOW (ref 6.5–8.1)

## 2022-03-14 LAB — CBC
HCT: 37.5 % — ABNORMAL LOW (ref 39.0–52.0)
Hemoglobin: 11.9 g/dL — ABNORMAL LOW (ref 13.0–17.0)
MCH: 31.2 pg (ref 26.0–34.0)
MCHC: 31.7 g/dL (ref 30.0–36.0)
MCV: 98.4 fL (ref 80.0–100.0)
Platelets: 117 10*3/uL — ABNORMAL LOW (ref 150–400)
RBC: 3.81 MIL/uL — ABNORMAL LOW (ref 4.22–5.81)
RDW: 13 % (ref 11.5–15.5)
WBC: 7.2 10*3/uL (ref 4.0–10.5)
nRBC: 0 % (ref 0.0–0.2)

## 2022-03-14 LAB — GLUCOSE, CAPILLARY
Glucose-Capillary: 132 mg/dL — ABNORMAL HIGH (ref 70–99)
Glucose-Capillary: 87 mg/dL (ref 70–99)
Glucose-Capillary: 111 mg/dL — ABNORMAL HIGH (ref 70–99)
Glucose-Capillary: 128 mg/dL — ABNORMAL HIGH (ref 70–99)

## 2022-03-14 LAB — URINE CULTURE: Culture: NO GROWTH

## 2022-03-14 LAB — HIV ANTIBODY (ROUTINE TESTING W REFLEX): HIV Screen 4th Generation wRfx: NONREACTIVE

## 2022-03-14 LAB — MRSA NEXT GEN BY PCR, NASAL: MRSA by PCR Next Gen: NOT DETECTED

## 2022-03-14 MED ORDER — CEFAZOLIN SODIUM-DEXTROSE 2-4 GM/100ML-% IV SOLN
2.0000 g | Freq: Three times a day (TID) | INTRAVENOUS | Status: DC
  Administered 2022-03-14 – 2022-03-19 (×16): 2 g via INTRAVENOUS
  Filled 2022-03-14 (×16): qty 100

## 2022-03-14 NOTE — Progress Notes (Signed)
PROGRESS NOTE    Jason Melendez  XTK:[id number] DOB: Mar 21, 1956 DOA: 03/13/2022 PCP: Doreene Nest, NP   Brief Narrative: 66 year old male with history of type 2 diabetes, hypertension, hyperlipidemia, cardiopulmonary arrest and obesity, nonhemorrhagic CVA was sent to ER from urgent care due to hypotension.  He had fever at home of 103.  He was dizzy and was presyncope episode.  His vision turned blurry and grayish. He has chronic right lower extremity edema.  He is sent erythema to his right lower extremity and pain.  He denies any trauma.  He had never had cellulitis before.  Lactic acid was 4.4 on admission white count 10.7. Chest x-ray no acute cardiopulmonary disease on portable chest radiograph.    CT head without contrast with no acute intracranial normality.   CT renal study with stranding in the right groin with a large right groin lymph nodes, nonspecific but may be due to cellulitis.  There is bilateral perinephric stranding.  There was bilateral nonobstructing nephrolithiasis.  There is marked hepatic asteatosis 6.  There was aortic atherosclerosis.  No DVT was seen preliminary on Doppler ultrasound  Assessment & Plan:   Principal Problem:   Sepsis due to undetermined organism [facility]) Active Problems:   HLD (hyperlipidemia)   Type 2 diabetes mellitus (HCC)   Chronic combined systolic and diastolic heart failure (HCC)   Tinea pedis of right foot   Cellulitis of right lower extremity   Hepatic steatosis   Aortic atherosclerosis (HCC)   Urolithiasis  #1 right lower extremity cellulitis-is still appears to be tender erythematous and swollen.  There is no drainage noted.  Narrowed antibiotics to Ancef.  Vancomycin and Rocephin stopped.  #2 type 2 diabetes on Farxiga and metformin prior to admission CBG (last 3) continue SSI.a1c 6.2 Recent Labs    03/13/22 2037 03/14/22 0730 03/14/22 1133  GLUCAP 139* 132* 87   #3hyperlipedemia-on statin  # 4 chf  stable Continue home meds Entresto Aldactone Farxiga Lasix on hold will reassess in a.m. and restart if needed currently he is stable Probably a little dry  #5 depression on Lexapro   Estimated body mass index is 33.9 kg/m as calculated from the following:   Height as of this encounter: 6\' 2"  (1.88 m).   Weight as of this encounter: 119.7 kg.  DVT prophylaxis: Lovenox  code Status: Full code  family Communication: Discussed with wife at bedside  disposition Plan:  Status is: Inpatient    Consultants:  None  Procedures: None Antimicrobials: Ancef  Subjective: Complains of right lower extremity pain more when he is walking on it.  Objective: Vitals:   03/13/22 1613 03/13/22 1834 03/13/22 2035 03/14/22 0115  BP: (!) 144/88  (!) 116/51 120/75  Pulse: (!) 101  76 72  Resp: 20  20 17   Temp: (!) 101.7 F (38.7 C) 98.7 F (37.1 C) 98.4 F (36.9 C) 98.4 F (36.9 C)  TempSrc: Oral Oral Oral Oral  SpO2: 94%  95% 96%  Weight:      Height:        Intake/Output Summary (Last 24 hours) at 03/14/2022 0955 Last data filed at 03/14/2022 0830 Gross per 24 hour  Intake 1927.03 ml  Output 400 ml  Net 1527.03 ml   Filed Weights   03/13/22 1056  Weight: 119.7 kg    Examination:  General exam: Appears in distress due to right lower extremity pain respiratory system: Clear to auscultation. Respiratory effort normal. Cardiovascular system: S1 & S2 heard, RRR.  No JVD, murmurs, rubs, gallops or clicks. No pedal edema. Gastrointestinal system: Abdomen is nondistended, soft and nontender. No organomegaly or masses felt. Normal bowel sounds heard. Central nervous system: Alert and oriented. No focal neurological deficits. Extremities: Right lower extremity with 1+ edema and edema and tenderness noted. Skin: No rashes, lesions or ulcers Psychiatry: Judgement and insight appear normal. Mood & affect appropriate.     Data Reviewed: I have personally reviewed following labs and  imaging studies  CBC: Recent Labs  Lab 03/12/22 1623 03/13/22 1104 03/14/22 0439  WBC 12.3* 10.7* 7.2  NEUTROABS 10.6* 9.2*  --   HGB 14.0 13.8 11.9*  HCT 42.9 43.4 37.5*  MCV 96.6 98.2 98.4  PLT 140* 146* 123XX123*   Basic Metabolic Panel: Recent Labs  Lab 03/12/22 1623 03/13/22 1104 03/14/22 0439  NA 141 137 140  K 4.2 4.5 4.1  CL 105 104 108  CO2 22 20* 23  GLUCOSE 166* 242* 124*  BUN 16 21 21   CREATININE 1.13 1.53* 1.00  CALCIUM 9.7 9.4 8.8*   GFR: Estimated Creatinine Clearance: 101.3 mL/min (by C-G formula based on SCr of 1 mg/dL). Liver Function Tests: Recent Labs  Lab 03/12/22 1623 03/13/22 1104 03/14/22 0439  AST 26 23 18   ALT 15 14 13   ALKPHOS 28* 27* 24*  BILITOT 1.7* 1.4* 0.9  PROT 6.8 7.3 6.2*  ALBUMIN 3.8 3.8 3.1*   No results for input(s): "LIPASE", "AMYLASE" in the last 168 hours. No results for input(s): "AMMONIA" in the last 168 hours. Coagulation Profile: Recent Labs  Lab 03/13/22 1104  INR 1.1   Cardiac Enzymes: No results for input(s): "CKTOTAL", "CKMB", "CKMBINDEX", "TROPONINI" in the last 168 hours. BNP (last 3 results) No results for input(s): "PROBNP" in the last 8760 hours. HbA1C: No results for input(s): "HGBA1C" in the last 72 hours. CBG: Recent Labs  Lab 03/13/22 1619 03/13/22 2037 03/14/22 0730  GLUCAP 113* 139* 132*   Lipid Profile: No results for input(s): "CHOL", "HDL", "LDLCALC", "TRIG", "CHOLHDL", "LDLDIRECT" in the last 72 hours. Thyroid Function Tests: No results for input(s): "TSH", "T4TOTAL", "FREET4", "T3FREE", "THYROIDAB" in the last 72 hours. Anemia Panel: No results for input(s): "VITAMINB12", "FOLATE", "FERRITIN", "TIBC", "IRON", "RETICCTPCT" in the last 72 hours. Sepsis Labs: Recent Labs  Lab 03/12/22 1623 03/13/22 1104 03/13/22 1259 03/13/22 1749  LATICACIDVEN 2.1* 4.4* 3.8* 1.5    Recent Results (from the past 240 hour(s))  Blood Culture (routine x 2)     Status: None (Preliminary result)    Collection Time: 03/13/22 11:04 AM   Specimen: BLOOD  Result Value Ref Range Status   Specimen Description   Final    BLOOD BLOOD LEFT HAND Performed at [facility], [place] [address], [place], [place] [address]    Special Requests   Final    BOTTLES DRAWN AEROBIC AND ANAEROBIC Blood Culture adequate volume Performed at [facility] [address] [place], [place] [address]    Culture   Final    NO GROWTH < 24 HOURS Performed at [facility] [address], [place], [place] [address]    Report Status PENDING  Incomplete  Blood Culture (routine x 2)     Status: None (Preliminary result)   Collection Time: 03/13/22 12:00 PM   Specimen: BLOOD  Result Value Ref Range Status   Specimen Description   Final    BLOOD RIGHT ANTECUBITAL Performed at [facility] [address], [place],  [address]  Special Requests   Final    BOTTLES DRAWN AEROBIC AND ANAEROBIC Blood Culture results may not be optimal due to an excessive volume of blood received in culture bottles Performed at [facility] [address], [place], [place] [address]    Culture   Final    NO GROWTH < 24 HOURS Performed at [facility] [address] [place], [place] [address]    Report Status PENDING  Incomplete  Resp Panel by RT-PCR (Flu A&B, Covid) Anterior Nasal Swab     Status: None   Collection Time: 03/13/22  1:15 PM   Specimen: Anterior Nasal Swab  Result Value Ref Range Status   SARS Coronavirus 2 by RT PCR NEGATIVE NEGATIVE Final    Comment: (NOTE) SARS-CoV-2 target nucleic acids are NOT DETECTED.  The SARS-CoV-2 RNA is generally detectable in upper respiratory specimens during the acute phase of infection. The lowest concentration of SARS-CoV-2 viral copies this assay can detect is 138 copies/mL. A negative result does not preclude SARS-Cov-2 infection and should not be used as the sole basis for treatment  or other patient management decisions. A negative result may occur with  improper specimen collection/handling, submission of specimen other than nasopharyngeal swab, presence of viral mutation(s) within the areas targeted by this assay, and inadequate number of viral copies(<138 copies/mL). A negative result must be combined with clinical observations, patient history, and epidemiological information. The expected result is Negative.  Fact Sheet for Patients:  EntrepreneurPulse.com.au  Fact Sheet for Healthcare Providers:  IncredibleEmployment.be  This test is no t yet approved or cleared by the [place] FDA and  has been authorized for detection and/or diagnosis of SARS-CoV-2 by FDA under an Emergency Use Authorization (EUA). This EUA will remain  in effect (meaning this test can be used) for the duration of the COVID-19 declaration under Section 564(b)(1) of the Act, 21 U.S.C.section 360bbb-3(b)(1), unless the authorization is terminated  or revoked sooner.       Influenza A by PCR NEGATIVE NEGATIVE Final   Influenza B by PCR NEGATIVE NEGATIVE Final    Comment: (NOTE) The Xpert Xpress SARS-CoV-2/FLU/RSV plus assay is intended as an aid in the diagnosis of influenza from Nasopharyngeal swab specimens and should not be used as a sole basis for treatment. Nasal washings and aspirates are unacceptable for Xpert Xpress SARS-CoV-2/FLU/RSV testing.  Fact Sheet for Patients: EntrepreneurPulse.com.au  Fact Sheet for Healthcare Providers: IncredibleEmployment.be  This test is not yet approved or cleared by the [place] FDA and has been authorized for detection and/or diagnosis of SARS-CoV-2 by FDA under an Emergency Use Authorization (EUA). This EUA will remain in effect (meaning this test can be used) for the duration of the COVID-19 declaration under Section 564(b)(1) of the Act, 21 U.S.C. section  360bbb-3(b)(1), unless the authorization is terminated or revoked.  Performed at [facility], [place] [address] [place], [place] [address]          Radiology Studies: VAS [place] LOWER EXTREMITY VENOUS (DVT) (ONLY [facility] & [facility])  Result Date: 03/14/2022  Lower Venous DVT Study Patient Name:  IRAM SEMINARIO  Date of Exam:   03/13/2022 Medical Rec #: [id number]              Accession #:    [id number] Date of Birth: [birth year]             Patient Gender: M Patient Age:   34 years Exam Location:  [facility]  Procedure:      VAS [place] LOWER EXTREMITY VENOUS (DVT) Referring Phys: Glynn Octave --------------------------------------------------------------------------------  Indications: Edema, and Erythema.  Limitations: Body habitus and poor ultrasound/tissue interface. Comparison Study: No prior study Performing Technologist: Gertie Fey MHA, RDMS, RVT, RDCS  Examination Guidelines: A complete evaluation includes B-mode imaging, spectral Doppler, color Doppler, and power Doppler as needed of all accessible portions of each vessel. Bilateral testing is considered an integral part of a complete examination. Limited examinations for reoccurring indications may be performed as noted. The reflux portion of the exam is performed with the patient in reverse Trendelenburg.  +---------+---------------+---------+-----------+----------+--------------+ RIGHT    CompressibilityPhasicitySpontaneityPropertiesThrombus Aging +---------+---------------+---------+-----------+----------+--------------+ CFV      Full           Yes      Yes                                 +---------+---------------+---------+-----------+----------+--------------+ SFJ      Full                                                        +---------+---------------+---------+-----------+----------+--------------+ FV Prox  Full                                                         +---------+---------------+---------+-----------+----------+--------------+ FV Mid   Full                                                        +---------+---------------+---------+-----------+----------+--------------+ FV DistalFull                                                        +---------+---------------+---------+-----------+----------+--------------+ PFV      Full                                                        +---------+---------------+---------+-----------+----------+--------------+ POP      Full           Yes      Yes                                 +---------+---------------+---------+-----------+----------+--------------+ PTV      Full                                                        +---------+---------------+---------+-----------+----------+--------------+ PERO  Full                                                        +---------+---------------+---------+-----------+----------+--------------+   +----+---------------+---------+-----------+----------+--------------+ LEFTCompressibilityPhasicitySpontaneityPropertiesThrombus Aging +----+---------------+---------+-----------+----------+--------------+ CFV Full           Yes      Yes                                 +----+---------------+---------+-----------+----------+--------------+    Summary: RIGHT: - There is no evidence of deep vein thrombosis in the lower extremity.  - No cystic structure found in the popliteal fossa. - Ultrasound characteristics of enlarged lymph nodes are noted in the groin.  LEFT: - No evidence of common femoral vein obstruction.  *See table(s) above for measurements and observations. Electronically signed by Harold Barban MD on 03/14/2022 at 12:48:51 AM.    Final    [place] RT LOWER EXTREM LTD SOFT TISSUE NON VASCULAR  Result Date: 03/13/2022 CLINICAL DATA:  Right inguinal lymphadenopathy EXAM: ULTRASOUND RIGHT LOWER EXTREMITY LIMITED TECHNIQUE:  Ultrasound examination of the lower extremity soft tissues was performed in the area of clinical concern. COMPARISON:  None Available. FINDINGS: Targeted ultrasound was performed of the soft tissues of the right groin. There are two prominent lymph nodes are identified in the right inguinal region, largest measuring 3.5 x 0.9 x 1.9 cm. Preserved echogenic fatty hila within each node. Mildly increased color Doppler within the nodes. No surrounding soft tissue edema or fluid. IMPRESSION: Two mildly enlarged right inguinal lymph nodes, nonspecific and may be reactive. Clinical follow-up is recommended to ensure resolution. Electronically Signed   By: Davina Poke D.O.   On: 03/13/2022 14:14   CT Renal Stone Study  Result Date: 03/13/2022 CLINICAL DATA:  A 66 year old male presents for evaluation of flank pain, suspected kidney stones. EXAM: CT ABDOMEN AND PELVIS WITHOUT CONTRAST TECHNIQUE: Multidetector CT imaging of the abdomen and pelvis was performed following the standard protocol without IV contrast. RADIATION DOSE REDUCTION: This exam was performed according to the departmental dose-optimization program which includes automated exposure control, adjustment of the mA and/or kV according to patient size and/or use of iterative reconstruction technique. COMPARISON:  August of 2020, chest CT. No recent relevant priors for comparison. FINDINGS: Lower chest: Incidental imaging of the lung bases with mild basilar atelectasis. Hepatobiliary: Marked hepatic steatosis. Smooth hepatic contours. No pericholecystic stranding. No signs of biliary duct dilation. Pancreas: Pancreatic contours are smooth. No signs of pancreatic inflammation. Spleen: Normal size and contour. Adrenals/Urinary Tract: Adrenal glands are normal. Smooth renal contours without hydronephrosis. Mild perinephric stranding is noted bilaterally perhaps slightly worse on the LEFT than the RIGHT. Nephrolithiasis in the bilateral kidneys, 4 mm on the  LEFT in the interpolar LEFT kidney. 2 mm on the RIGHT in the lower pole of the RIGHT kidney. No signs of ureteral calculus. No substantial perivesical stranding. No hydronephrosis. Stomach/Bowel: No stranding adjacent to the stomach or small bowel. No sign of small bowel obstruction. The appendix is normal. No pericolonic stranding or signs of colonic inflammation or obstruction. Vascular/Lymphatic: Aortic atherosclerosis. No sign of aneurysm. Smooth contour of the IVC. There is no gastrohepatic or hepatoduodenal ligament lymphadenopathy. No retroperitoneal or mesenteric lymphadenopathy. No pelvic sidewall lymphadenopathy. Atherosclerotic changes are mild in terms of  calcified changes. Study limited with respect to vascular assessment due to lack of intravenous contrast. RIGHT groin adenopathy and mild stranding in the RIGHT groin (image 99/2) 16 mm lymph node. Mild stranding in the adjacent fat in this area. Reproductive: Unremarkable by CT. Other: No ascites.  No pneumoperitoneum. Musculoskeletal: No acute bone finding. No destructive bone process. Spinal degenerative changes. IMPRESSION: 1. Stranding in the RIGHT groin with enlarged RIGHT groin lymph nodes, nonspecific but would correlate with any signs of cellulitis and side and site of symptoms. In the absence of obvious inflammation or explanation for reactive lymph nodes would suggest focused ultrasound evaluation and correlation with any history of malignancy as enlargement could be seen in the setting of neoplasm. If there are signs of inflammation in this area clinically, short interval follow-up ultrasound is suggested to ensure resolution. 2. Bilateral perinephric stranding is noted bilaterally perhaps slightly worse on the LEFT than the RIGHT. This finding can be seen in the setting of urinary tract infection but could also be chronic. Correlation with urinalysis is recommended. 3. Bilateral nonobstructing nephrolithiasis, no ureteral calculi. 4. Marked  hepatic steatosis. 5. Aortic atherosclerosis. Aortic Atherosclerosis (ICD10-I70.0). Electronically Signed   By: Zetta Bills M.D.   On: 03/13/2022 13:06   CT Head Wo Contrast  Result Date: 03/13/2022 CLINICAL DATA:  Neuro deficit, acute, stroke suspected EXAM: CT HEAD WITHOUT CONTRAST TECHNIQUE: Contiguous axial images were obtained from the base of the skull through the vertex without intravenous contrast. RADIATION DOSE REDUCTION: This exam was performed according to the departmental dose-optimization program which includes automated exposure control, adjustment of the mA and/or kV according to patient size and/or use of iterative reconstruction technique. COMPARISON:  10/12/2016, 10/13/2016 FINDINGS: Brain: No evidence of acute infarction, hemorrhage, hydrocephalus, extra-axial collection or mass lesion/mass effect. Remote left ACA territory infarct again seen. Mild age related cerebral volume loss. Vascular: Atherosclerotic calcifications involving the large vessels of the skull base. No unexpected hyperdense vessel. Skull: Normal. Negative for fracture or focal lesion. Sinuses/Orbits: No acute finding. Other: None. IMPRESSION: 1. No acute intracranial abnormality. 2. Remote left ACA territory infarct. Electronically Signed   By: Davina Poke D.O.   On: 03/13/2022 12:50   DG Chest Port 1 View  Result Date: 03/13/2022 CLINICAL DATA:  Questionable sepsis. EXAM: PORTABLE CHEST 1 VIEW COMPARISON:  March 30, 2018. FINDINGS: EKG leads project over the chest. Trachea midline. Cardiomediastinal contours and hilar structures are normal. Lungs are clear. No pneumothorax. On limited assessment there is no acute skeletal process. IMPRESSION: No acute cardiopulmonary disease. Electronically Signed   By: Zetta Bills M.D.   On: 03/13/2022 11:21        Scheduled Meds:  aspirin  325 mg Oral Daily   atorvastatin  40 mg Oral QHS   carvedilol  12.5 mg Oral BID WC   clotrimazole   Topical BID    dapagliflozin propanediol  10 mg Oral QAC breakfast   enoxaparin (LOVENOX) injection  60 mg Subcutaneous Q24H   escitalopram  10 mg Oral QPM   insulin aspart  0-20 Units Subcutaneous TID WC   metFORMIN  1,000 mg Oral BID WC   sacubitril-valsartan  1 tablet Oral BID   spironolactone  25 mg Oral Daily   Continuous Infusions:  cefTRIAXone (ROCEPHIN)  IV Stopped (03/13/22 1233)   vancomycin       LOS: 0 days    Time spent: 38 min  Georgette Shell, MD 03/14/2022, 9:55 AM

## 2022-03-15 DIAGNOSIS — A419 Sepsis, unspecified organism: Secondary | ICD-10-CM | POA: Diagnosis not present

## 2022-03-15 LAB — GLUCOSE, CAPILLARY
Glucose-Capillary: 140 mg/dL — ABNORMAL HIGH (ref 70–99)
Glucose-Capillary: 98 mg/dL (ref 70–99)
Glucose-Capillary: 130 mg/dL — ABNORMAL HIGH (ref 70–99)
Glucose-Capillary: 136 mg/dL — ABNORMAL HIGH (ref 70–99)

## 2022-03-15 MED ORDER — LOPERAMIDE HCL 2 MG PO CAPS
2.0000 mg | ORAL_CAPSULE | Freq: Four times a day (QID) | ORAL | Status: DC | PRN
Start: 2022-03-15 — End: 2022-03-20
  Administered 2022-03-15 – 2022-03-17 (×3): 2 mg via ORAL
  Filled 2022-03-15 (×3): qty 1

## 2022-03-15 NOTE — Progress Notes (Signed)
PROGRESS NOTE    Jason Melendez  TFT:[id number] DOB: 1955/11/03 DOA: 03/13/2022 PCP: Doreene Nest, NP   Brief Narrative: 66 year old male with history of type 2 diabetes, hypertension, hyperlipidemia, cardiopulmonary arrest and obesity, nonhemorrhagic CVA was sent to ER from urgent care due to hypotension.  He had fever at home of 103.  He was dizzy and was presyncope episode.  His vision turned blurry and grayish. He has chronic right lower extremity edema.  He is sent erythema to his right lower extremity and pain.  He denies any trauma.  He had never had cellulitis before.  Lactic acid was 4.4 on admission white count 10.7. Chest x-ray no acute cardiopulmonary disease on portable chest radiograph.    CT head without contrast with no acute intracranial normality.   CT renal study with stranding in the right groin with a large right groin lymph nodes, nonspecific but may be due to cellulitis.  There is bilateral perinephric stranding.  There was bilateral nonobstructing nephrolithiasis.  There is marked hepatic asteatosis 6.  There was aortic atherosclerosis.  No DVT was seen preliminary on Doppler ultrasound  Assessment & Plan:   Principal Problem:   Sepsis due to undetermined organism [facility]) Active Problems:   HLD (hyperlipidemia)   Type 2 diabetes mellitus (HCC)   Chronic combined systolic and diastolic heart failure (HCC)   Tinea pedis of right foot   Cellulitis of right lower extremity   Hepatic steatosis   Aortic atherosclerosis (HCC)   Urolithiasis   Sepsis (HCC)  #1 right lower extremity cellulitis-patient complains of increasing edema to the right lower extremity.  Remains erythematous and tender.  Some redness noted in the right upper thigh close to the groin.   Narrowed antibiotics to Ancef.  Vancomycin and Rocephin stopped.  #2 type 2 diabetes on Farxiga and metformin prior to admission CBG (last 3) continue SSI.a1c 6.2 Recent Labs    03/14/22 2051  03/15/22 0804 03/15/22 1121  GLUCAP 128* 140* 98    #3hyperlipedemia-on statin  # 4 chf stable Continue home meds Entresto Aldactone Farxiga Lasix on hold will reassess in a.m. and restart if needed currently he is stable Probably a little dry  #5 depression on Lexapro   Estimated body mass index is 33.9 kg/m as calculated from the following:   Height as of this encounter: 6\' 2"  (1.88 m).   Weight as of this encounter: 119.7 kg.  DVT prophylaxis: Lovenox  code Status: Full code  family Communication: Discussed with wife at bedside  disposition Plan:  Status is: Inpatient    Consultants:  None  Procedures: None Antimicrobials: Ancef Subjective  Complains of increased swelling to the right lower extremity  Venous Doppler on exam shows no DVT Objective: Vitals:   03/14/22 1255 03/14/22 2024 03/15/22 0515 03/15/22 1129  BP: 128/64 120/68 136/68 129/66  Pulse: 76 79 72 66  Resp: 16 20 18 18   Temp: 98 F (36.7 C) 98.3 F (36.8 C) 98.2 F (36.8 C) 98.2 F (36.8 C)  TempSrc: Oral   Oral  SpO2: 96% 95% 96% 92%  Weight:      Height:        Intake/Output Summary (Last 24 hours) at 03/15/2022 1527 Last data filed at 03/15/2022 0531 Gross per 24 hour  Intake 440 ml  Output --  Net 440 ml    Filed Weights   03/13/22 1056  Weight: 119.7 kg    Examination:  General exam: Appears in distress due to right  lower extremity pain respiratory system: Clear to auscultation. Respiratory effort normal. Cardiovascular system: S1 & S2 heard, RRR. No JVD, murmurs, rubs, gallops or clicks. No pedal edema. Gastrointestinal system: Abdomen is nondistended, soft and nontender. No organomegaly or masses felt. Normal bowel sounds heard. Central nervous system: Alert and oriented. No focal neurological deficits. Extremities: Right lower extremity with 1+ edema and edema and tenderness noted. Skin: No rashes, lesions or ulcers Psychiatry: Judgement and insight appear normal. Mood  & affect appropriate.     Data Reviewed: I have personally reviewed following labs and imaging studies  CBC: Recent Labs  Lab 03/12/22 1623 03/13/22 1104 03/14/22 0439  WBC 12.3* 10.7* 7.2  NEUTROABS 10.6* 9.2*  --   HGB 14.0 13.8 11.9*  HCT 42.9 43.4 37.5*  MCV 96.6 98.2 98.4  PLT 140* 146* 117*    Basic Metabolic Panel: Recent Labs  Lab 03/12/22 1623 03/13/22 1104 03/14/22 0439  NA 141 137 140  K 4.2 4.5 4.1  CL 105 104 108  CO2 22 20* 23  GLUCOSE 166* 242* 124*  BUN 16 21 21   CREATININE 1.13 1.53* 1.00  CALCIUM 9.7 9.4 8.8*    GFR: Estimated Creatinine Clearance: 101.3 mL/min (by C-G formula based on SCr of 1 mg/dL). Liver Function Tests: Recent Labs  Lab 03/12/22 1623 03/13/22 1104 03/14/22 0439  AST 26 23 18   ALT 15 14 13   ALKPHOS 28* 27* 24*  BILITOT 1.7* 1.4* 0.9  PROT 6.8 7.3 6.2*  ALBUMIN 3.8 3.8 3.1*    No results for input(s): "LIPASE", "AMYLASE" in the last 168 hours. No results for input(s): "AMMONIA" in the last 168 hours. Coagulation Profile: Recent Labs  Lab 03/13/22 1104  INR 1.1    Cardiac Enzymes: No results for input(s): "CKTOTAL", "CKMB", "CKMBINDEX", "TROPONINI" in the last 168 hours. BNP (last 3 results) No results for input(s): "PROBNP" in the last 8760 hours. HbA1C: No results for input(s): "HGBA1C" in the last 72 hours. CBG: Recent Labs  Lab 03/14/22 1133 03/14/22 1653 03/14/22 2051 03/15/22 0804 03/15/22 1121  GLUCAP 87 111* 128* 140* 98    Lipid Profile: No results for input(s): "CHOL", "HDL", "LDLCALC", "TRIG", "CHOLHDL", "LDLDIRECT" in the last 72 hours. Thyroid Function Tests: No results for input(s): "TSH", "T4TOTAL", "FREET4", "T3FREE", "THYROIDAB" in the last 72 hours. Anemia Panel: No results for input(s): "VITAMINB12", "FOLATE", "FERRITIN", "TIBC", "IRON", "RETICCTPCT" in the last 72 hours. Sepsis Labs: Recent Labs  Lab 03/12/22 1623 03/13/22 1104 03/13/22 1259 03/13/22 1749  LATICACIDVEN  2.1* 4.4* 3.8* 1.5     Recent Results (from the past 240 hour(s))  Urine Culture     Status: None   Collection Time: 03/13/22 10:49 AM   Specimen: In/Out Cath Urine  Result Value Ref Range Status   Specimen Description   Final    IN/OUT CATH URINE Performed at [facility], [address], [place], [place]    Special Requests   Final    NONE Performed at [facility], [address] [place], [place]    Culture   Final    NO GROWTH Performed at [facility], [address], [place], [facility] [address]    Report Status 03/14/2022 FINAL  Final  Blood Culture (routine x 2)     Status: None (Preliminary result)   Collection Time: 03/13/22 11:04 AM   Specimen: BLOOD  Result Value Ref Range Status   Specimen Description   Final  BLOOD BLOOD LEFT HAND Performed at [facility], [address] [place], [place] [address]    Special Requests   Final    BOTTLES DRAWN AEROBIC AND ANAEROBIC Blood Culture adequate volume Performed at [facility], [address], [place], [place] [address]    Culture   Final    NO GROWTH 2 DAYS Performed at [facility], [address] [place], [place] [address]    Report Status PENDING  Incomplete  Blood Culture (routine x 2)     Status: None (Preliminary result)   Collection Time: 03/13/22 12:00 PM   Specimen: BLOOD  Result Value Ref Range Status   Specimen Description   Final    BLOOD RIGHT ANTECUBITAL Performed at [facility], [address] [place], [place] [address]    Special Requests   Final    BOTTLES DRAWN AEROBIC AND ANAEROBIC Blood Culture results may not be optimal due to an excessive volume of blood received in culture bottles Performed at [facility], [address] [place], [place] [address]    Culture   Final    NO GROWTH 2 DAYS Performed at [facility], [address] [place], [place] [address]    Report Status PENDING  Incomplete  Resp Panel by RT-PCR (Flu A&B, Covid) Anterior Nasal Swab     Status: None   Collection Time: 03/13/22  1:15 PM   Specimen: Anterior Nasal Swab  Result Value Ref Range Status   SARS Coronavirus 2 by RT PCR NEGATIVE NEGATIVE Final    Comment: (NOTE) SARS-CoV-2 target nucleic acids are NOT DETECTED.  The SARS-CoV-2 RNA is generally detectable in upper respiratory specimens during the acute phase of infection. The lowest concentration of SARS-CoV-2 viral copies this assay can detect is 138 copies/mL. A negative result does not preclude SARS-Cov-2 infection and should not be used as the sole basis for treatment or other patient management decisions. A negative result may occur with  improper specimen collection/handling, submission of specimen other than nasopharyngeal swab, presence of viral mutation(s) within the areas targeted by this assay, and inadequate number of viral copies(<138 copies/mL). A negative result must be combined with clinical observations, patient history, and epidemiological information. The expected result is Negative.  Fact Sheet for Patients:  BloggerCourse.com  Fact Sheet for Healthcare Providers:  SeriousBroker.it  This test is no t yet approved or cleared by the [place] FDA and  has been authorized for detection and/or diagnosis of SARS-CoV-2 by FDA under an Emergency Use Authorization (EUA). This EUA will remain  in effect (meaning this test can be used) for the duration of the COVID-19 declaration under Section 564(b)(1) of the Act, 21 U.S.C.section 360bbb-3(b)(1), unless the authorization is terminated  or revoked sooner.       Influenza A by PCR NEGATIVE NEGATIVE Final   Influenza B by PCR NEGATIVE NEGATIVE Final    Comment: (NOTE) The Xpert Xpress SARS-CoV-2/FLU/RSV plus assay is intended as an aid in the diagnosis of influenza  from Nasopharyngeal swab specimens and should not be used as a sole basis for treatment. Nasal washings and aspirates are unacceptable for Xpert Xpress SARS-CoV-2/FLU/RSV testing.  Fact Sheet for Patients: BloggerCourse.com  Fact Sheet for Healthcare Providers: SeriousBroker.it  This test is not yet approved or cleared by the [place] FDA and has been authorized for detection and/or diagnosis of SARS-CoV-2 by FDA under an Emergency Use Authorization (EUA). This EUA will remain  in effect (meaning this test can be used) for the duration of the COVID-19 declaration under Section 564(b)(1) of the Act, 21 U.S.C. section 360bbb-3(b)(1), unless the authorization is terminated or revoked.  Performed at [facility], [address], [place], [place] [address]   [name] Next Gen by PCR, Nasal     Status: None   Collection Time: 03/14/22  2:00 PM   Specimen: Nasal Mucosa; Nasal Swab  Result Value Ref Range Status   [name] by PCR Next Gen NOT DETECTED NOT DETECTED Final    Comment: (NOTE) The GeneXpert [name] Assay (FDA approved for NASAL specimens only), is one component of a comprehensive [name] colonization surveillance program. It is not intended to diagnose [name] infection nor to guide or monitor treatment for [name] infections. Test performance is not FDA approved in patients less than 58 years old. Performed at [facility], [address] [place], [place] [address]          Radiology Studies: No results found.      Scheduled Meds:  aspirin  325 mg Oral Daily   atorvastatin  40 mg Oral QHS   carvedilol  12.5 mg Oral BID WC   clotrimazole   Topical BID   dapagliflozin propanediol  10 mg Oral QAC breakfast   enoxaparin (LOVENOX) injection  60 mg Subcutaneous Q24H   escitalopram  10 mg Oral QPM   insulin aspart  0-20 Units Subcutaneous TID WC   metFORMIN  1,000 mg Oral BID WC    sacubitril-valsartan  1 tablet Oral BID   spironolactone  25 mg Oral Daily   Continuous Infusions:   ceFAZolin (ANCEF) IV 2 g (03/15/22 1352)     LOS: 1 day    Time spent: 38 min  Alwyn Ren, MD 03/15/2022, 3:27 PM

## 2022-03-16 ENCOUNTER — Inpatient Hospital Stay (HOSPITAL_COMMUNITY): Payer: HMO

## 2022-03-16 ENCOUNTER — Encounter (HOSPITAL_COMMUNITY): Payer: HMO

## 2022-03-16 DIAGNOSIS — R609 Edema, unspecified: Secondary | ICD-10-CM | POA: Diagnosis not present

## 2022-03-16 DIAGNOSIS — A419 Sepsis, unspecified organism: Secondary | ICD-10-CM | POA: Diagnosis not present

## 2022-03-16 LAB — CBC
HCT: 37.1 % — ABNORMAL LOW (ref 39.0–52.0)
Hemoglobin: 11.9 g/dL — ABNORMAL LOW (ref 13.0–17.0)
MCH: 31.6 pg (ref 26.0–34.0)
MCHC: 32.1 g/dL (ref 30.0–36.0)
MCV: 98.7 fL (ref 80.0–100.0)
Platelets: 147 10*3/uL — ABNORMAL LOW (ref 150–400)
RBC: 3.76 MIL/uL — ABNORMAL LOW (ref 4.22–5.81)
RDW: 13 % (ref 11.5–15.5)
WBC: 4.7 10*3/uL (ref 4.0–10.5)
nRBC: 0 % (ref 0.0–0.2)

## 2022-03-16 LAB — GLUCOSE, CAPILLARY
Glucose-Capillary: 162 mg/dL — ABNORMAL HIGH (ref 70–99)
Glucose-Capillary: 112 mg/dL — ABNORMAL HIGH (ref 70–99)
Glucose-Capillary: 117 mg/dL — ABNORMAL HIGH (ref 70–99)
Glucose-Capillary: 159 mg/dL — ABNORMAL HIGH (ref 70–99)

## 2022-03-16 LAB — BASIC METABOLIC PANEL
Anion gap: 5 (ref 5–15)
BUN: 13 mg/dL (ref 8–23)
CO2: 26 mmol/L (ref 22–32)
Calcium: 8.7 mg/dL — ABNORMAL LOW (ref 8.9–10.3)
Chloride: 112 mmol/L — ABNORMAL HIGH (ref 98–111)
Creatinine, Ser: 0.92 mg/dL (ref 0.61–1.24)
GFR, Estimated: >60 mL/min (ref >60)
Glucose, Bld: 136 mg/dL — ABNORMAL HIGH (ref 70–99)
Potassium: 4.2 mmol/L (ref 3.5–5.1)
Sodium: 143 mmol/L (ref 135–145)

## 2022-03-16 MED ORDER — IOHEXOL 300 MG/ML  SOLN
80.0000 mL | Freq: Once | INTRAMUSCULAR | Status: AC | PRN
  Administered 2022-03-16: 80 mL via INTRAVENOUS

## 2022-03-16 MED ORDER — ORAL CARE MOUTH RINSE: 15.0000 mL | OROMUCOSAL | Status: DC | PRN

## 2022-03-16 MED ORDER — TRAMADOL HCL 50 MG PO TABS
50.0000 mg | ORAL_TABLET | Freq: Once | ORAL | Status: AC
  Administered 2022-03-16: 50 mg via ORAL
  Filled 2022-03-16: qty 1

## 2022-03-16 NOTE — Progress Notes (Signed)
Patient c/o worsening redness and pain to RLE. Very darkened area to posterior right calf, measuring 16 cm x 3 cm at top, and 9 cm wide at bottom- pt & wife state this area is new. Area marked with marker. Pt states area is very painful when he tightens his calf muscle at all or touches it. Area warm to touch also. MD [name] made aware, doppler ordered (pt does not remember assessing calf during previous doppler exam). Will continue to monitor closely.

## 2022-03-16 NOTE — Progress Notes (Signed)
Right lower extremity venous duplex has been completed. Preliminary results can be found in CV Proc through chart review.   03/16/22 4:06 PM Olen Cordial RVT

## 2022-03-16 NOTE — Progress Notes (Signed)
PROGRESS NOTE    Jason Melendez  [id number] DOB: [birth year] DOA: 03/13/2022 PCP: Pleas Koch, NP   Brief Narrative: 66 year old male with history of type 2 diabetes, hypertension, hyperlipidemia, cardiopulmonary arrest and obesity, nonhemorrhagic CVA was sent to ER from urgent care due to hypotension.  He had fever at home of 103.  He was dizzy and was presyncope episode.  His vision turned blurry and grayish. He has chronic right lower extremity edema.  He is sent erythema to his right lower extremity and pain.  He denies any trauma.  He had never had cellulitis before.  Lactic acid was 4.4 on admission white count 10.7. Chest x-ray no acute cardiopulmonary disease on portable chest radiograph.    CT head without contrast with no acute intracranial normality.   CT renal study with stranding in the right groin with a large right groin lymph nodes, nonspecific but may be due to cellulitis.  There is bilateral perinephric stranding.  There was bilateral nonobstructing nephrolithiasis.  There is marked hepatic asteatosis 6.  There was aortic atherosclerosis.  No DVT was seen preliminary on Doppler ultrasound  Assessment & Plan:   Principal Problem:   Sepsis due to undetermined organism [facility]) Active Problems:   HLD (hyperlipidemia)   Type 2 diabetes mellitus (HCC)   Chronic combined systolic and diastolic heart failure (HCC)   Tinea pedis of right foot   Cellulitis of right lower extremity   Hepatic steatosis   Aortic atherosclerosis ([place])   Urolithiasis   Sepsis ([place])  #1 right lower extremity cellulitis-still with edema and erythema and erythema to the right lower extremity right dorsal aspect of the right foot.  Complains of pain.  Advised him to keep the feet and leg elevated while in bed.  Encourage activity as tolerated.   Some redness noted in the right upper thigh close to the groin.   Narrowed antibiotics to Ancef.  Vancomycin and Rocephin stopped. WBC is  normal.  #2 type 2 diabetes on Farxiga and metformin prior to admission CBG (last 3) continue SSI.a1c 6.2 Recent Labs    03/15/22 2201 03/16/22 0720 03/16/22 1147  GLUCAP 136* 162* 112*   Dietary consulted per patient request. #3hyperlipedemia-on statin  # 4 chf stable-continue home medications including Entresto, Aldactone, Farxiga. Lasix currently on hold reassess in a.m. and restart.  #5 depression on Lexapro   Estimated body mass index is 33.9 kg/m as calculated from the following:   Height as of this encounter: 6\' 2"  (1.88 m).   Weight as of this encounter: 119.7 kg.  DVT prophylaxis: Lovenox  code Status: Full code  family Communication: Discussed with wife at bedside  disposition Plan:  Status is: Inpatient    Consultants:  None  Procedures: None Antimicrobials: Ancef Subjective His main complaint is persistent right lower extremity edema and erythema and pain.  Objective: Vitals:   03/15/22 0515 03/15/22 1129 03/15/22 2204 03/16/22 0629  BP: 136/68 129/66 128/65 133/81  Pulse: 72 66 70 66  Resp: 18 18 18 18   Temp: 98.2 F (36.8 C) 98.2 F (36.8 C) 98.8 F (37.1 C) 98.2 F (36.8 C)  TempSrc:  Oral Oral Oral  SpO2: 96% 92% 99% 95%  Weight:      Height:        Intake/Output Summary (Last 24 hours) at 03/16/2022 1341 Last data filed at 03/16/2022 0644 Gross per 24 hour  Intake 620 ml  Output --  Net 620 ml    [employer]  03/13/22 1056  Weight: 119.7 kg    Examination:  General exam: Appears in distress due to right lower extremity pain respiratory system: Clear to auscultation. Respiratory effort normal. Cardiovascular system: S1 & S2 heard, RRR. No JVD, murmurs, rubs, gallops or clicks. No pedal edema. Gastrointestinal system: Abdomen is nondistended, soft and nontender. No organomegaly or masses felt. Normal bowel sounds heard. Central nervous system: Alert and oriented. No focal neurological deficits. Extremities: Right lower  extremity with 1+ edema and edema and tenderness noted. Skin: No rashes, lesions or ulcers Psychiatry: Judgement and insight appear normal. Mood & affect appropriate.     Data Reviewed: I have personally reviewed following labs and imaging studies  CBC: Recent Labs  Lab 03/12/22 1623 03/13/22 1104 03/14/22 0439 03/16/22 0352  WBC 12.3* 10.7* 7.2 4.7  NEUTROABS 10.6* 9.2*  --   --   HGB 14.0 13.8 11.9* 11.9*  HCT 42.9 43.4 37.5* 37.1*  MCV 96.6 98.2 98.4 98.7  PLT 140* 146* 117* 147*    Basic Metabolic Panel: Recent Labs  Lab 03/12/22 1623 03/13/22 1104 03/14/22 0439 03/16/22 0352  NA 141 137 140 143  K 4.2 4.5 4.1 4.2  CL 105 104 108 112*  CO2 22 20* 23 26  GLUCOSE 166* 242* 124* 136*  BUN 16 21 21 13   CREATININE 1.13 1.53* 1.00 0.92  CALCIUM 9.7 9.4 8.8* 8.7*    GFR: Estimated Creatinine Clearance: 110.1 mL/min (by C-G formula based on SCr of 0.92 mg/dL). Liver Function Tests: Recent Labs  Lab 03/12/22 1623 03/13/22 1104 03/14/22 0439  AST 26 23 18   ALT 15 14 13   ALKPHOS 28* 27* 24*  BILITOT 1.7* 1.4* 0.9  PROT 6.8 7.3 6.2*  ALBUMIN 3.8 3.8 3.1*    No results for input(s): "LIPASE", "AMYLASE" in the last 168 hours. No results for input(s): "AMMONIA" in the last 168 hours. Coagulation Profile: Recent Labs  Lab 03/13/22 1104  INR 1.1    Cardiac Enzymes: No results for input(s): "CKTOTAL", "CKMB", "CKMBINDEX", "TROPONINI" in the last 168 hours. BNP (last 3 results) No results for input(s): "PROBNP" in the last 8760 hours. HbA1C: No results for input(s): "HGBA1C" in the last 72 hours. CBG: Recent Labs  Lab 03/15/22 1121 03/15/22 1646 03/15/22 2201 03/16/22 0720 03/16/22 1147  GLUCAP 98 130* 136* 162* 112*    Lipid Profile: No results for input(s): "CHOL", "HDL", "LDLCALC", "TRIG", "CHOLHDL", "LDLDIRECT" in the last 72 hours. Thyroid Function Tests: No results for input(s): "TSH", "T4TOTAL", "FREET4", "T3FREE", "THYROIDAB" in the last  72 hours. Anemia Panel: No results for input(s): "VITAMINB12", "FOLATE", "FERRITIN", "TIBC", "IRON", "RETICCTPCT" in the last 72 hours. Sepsis Labs: Recent Labs  Lab 03/12/22 1623 03/13/22 1104 03/13/22 1259 03/13/22 1749  LATICACIDVEN 2.1* 4.4* 3.8* 1.5     Recent Results (from the past 240 hour(s))  Urine Culture     Status: None   Collection Time: 03/13/22 10:49 AM   Specimen: In/Out Cath Urine  Result Value Ref Range Status   Specimen Description   Final    IN/OUT CATH URINE Performed at [facility], [place] [address] [place], [place] [address]    Special Requests   Final    NONE Performed at [facility], [place] [address], [place], [place] [address]    Culture   Final    NO GROWTH Performed at [facility], [place] [address], [place], [place] [address]    Report Status 03/14/2022 FINAL  Final  Blood  Culture (routine x 2)     Status: None (Preliminary result)   Collection Time: 03/13/22 11:04 AM   Specimen: BLOOD  Result Value Ref Range Status   Specimen Description   Final    BLOOD BLOOD LEFT HAND Performed at [facility], [address], [place], [place] [address]    Special Requests   Final    BOTTLES DRAWN AEROBIC AND ANAEROBIC Blood Culture adequate volume Performed at [facility], [address] [place], [place] [address]    Culture   Final    NO GROWTH 3 DAYS Performed at [facility], [address] [place], [place] [address]    Report Status PENDING  Incomplete  Blood Culture (routine x 2)     Status: None (Preliminary result)   Collection Time: 03/13/22 12:00 PM   Specimen: BLOOD  Result Value Ref Range Status   Specimen Description   Final    BLOOD RIGHT ANTECUBITAL Performed at [facility], [address] [place], [place] [address]    Special Requests   Final    BOTTLES DRAWN AEROBIC AND ANAEROBIC Blood Culture results may not be optimal  due to an excessive volume of blood received in culture bottles Performed at [facility], [address] [place], [place] [address]    Culture   Final    NO GROWTH 3 DAYS Performed at [facility], [address], [place], [place] [address]    Report Status PENDING  Incomplete  Resp Panel by RT-PCR (Flu A&B, Covid) Anterior Nasal Swab     Status: None   Collection Time: 03/13/22  1:15 PM   Specimen: Anterior Nasal Swab  Result Value Ref Range Status   SARS Coronavirus 2 by RT PCR NEGATIVE NEGATIVE Final    Comment: (NOTE) SARS-CoV-2 target nucleic acids are NOT DETECTED.  The SARS-CoV-2 RNA is generally detectable in upper respiratory specimens during the acute phase of infection. The lowest concentration of SARS-CoV-2 viral copies this assay can detect is 138 copies/mL. A negative result does not preclude SARS-Cov-2 infection and should not be used as the sole basis for treatment or other patient management decisions. A negative result may occur with  improper specimen collection/handling, submission of specimen other than nasopharyngeal swab, presence of viral mutation(s) within the areas targeted by this assay, and inadequate number of viral copies(<138 copies/mL). A negative result must be combined with clinical observations, patient history, and epidemiological information. The expected result is Negative.  Fact Sheet for Patients:  BloggerCourse.com  Fact Sheet for Healthcare Providers:  SeriousBroker.it  This test is no t yet approved or cleared by the [place] FDA and  has been authorized for detection and/or diagnosis of SARS-CoV-2 by FDA under an Emergency Use Authorization (EUA). This EUA will remain  in effect (meaning this test can be used) for the duration of the COVID-19 declaration under Section 564(b)(1) of the Act, 21 U.S.C.section 360bbb-3(b)(1), unless the authorization is  terminated  or revoked sooner.       Influenza A by PCR NEGATIVE NEGATIVE Final   Influenza B by PCR NEGATIVE NEGATIVE Final    Comment: (NOTE) The Xpert Xpress SARS-CoV-2/FLU/RSV plus assay is intended as an aid in the diagnosis of influenza from Nasopharyngeal swab specimens and should not be used as a sole basis for treatment. Nasal washings and aspirates are unacceptable for Xpert Xpress SARS-CoV-2/FLU/RSV testing.  Fact Sheet for Patients: BloggerCourse.com  Fact Sheet for  Healthcare Providers: SeriousBroker.it  This test is not yet approved or cleared by the [place] and has been authorized for detection and/or diagnosis of SARS-CoV-2 by FDA under an Emergency Use Authorization (EUA). This EUA will remain in effect (meaning this test can be used) for the duration of the COVID-19 declaration under Section 564(b)(1) of the Act, 21 U.S.C. section 360bbb-3(b)(1), unless the authorization is terminated or revoked.  Performed at [facility], [address] [place], [place] [address]   [name] Next Gen by PCR, Nasal     Status: None   Collection Time: 03/14/22  2:00 PM   Specimen: Nasal Mucosa; Nasal Swab  Result Value Ref Range Status   [name] by PCR Next Gen NOT DETECTED NOT DETECTED Final    Comment: (NOTE) The GeneXpert [name] Assay (FDA approved for NASAL specimens only), is one component of a comprehensive [name] colonization surveillance program. It is not intended to diagnose [name] infection nor to guide or monitor treatment for [name] infections. Test performance is not FDA approved in patients less than 57 years old. Performed at [facility], [address], [place], [place] [address]          Radiology Studies: No results found.      Scheduled Meds:  aspirin  325 mg Oral Daily   atorvastatin  40 mg Oral QHS   carvedilol  12.5 mg Oral BID WC   clotrimazole    Topical BID   dapagliflozin propanediol  10 mg Oral QAC breakfast   enoxaparin (LOVENOX) injection  60 mg Subcutaneous Q24H   escitalopram  10 mg Oral QPM   insulin aspart  0-20 Units Subcutaneous TID WC   metFORMIN  1,000 mg Oral BID WC   sacubitril-valsartan  1 tablet Oral BID   spironolactone  25 mg Oral Daily   Continuous Infusions:   ceFAZolin (ANCEF) IV 2 g (03/16/22 7106)     LOS: 2 days    Time spent: 38 min  Alwyn Ren, MD 03/16/2022, 1:41 PM

## 2022-03-17 DIAGNOSIS — I891 Lymphangitis: Secondary | ICD-10-CM | POA: Diagnosis not present

## 2022-03-17 DIAGNOSIS — K760 Fatty (change of) liver, not elsewhere classified: Secondary | ICD-10-CM

## 2022-03-17 DIAGNOSIS — L03115 Cellulitis of right lower limb: Secondary | ICD-10-CM | POA: Diagnosis not present

## 2022-03-17 DIAGNOSIS — A419 Sepsis, unspecified organism: Secondary | ICD-10-CM | POA: Diagnosis not present

## 2022-03-17 DIAGNOSIS — I5042 Chronic combined systolic (congestive) and diastolic (congestive) heart failure: Secondary | ICD-10-CM

## 2022-03-17 LAB — GLUCOSE, CAPILLARY
Glucose-Capillary: 144 mg/dL — ABNORMAL HIGH (ref 70–99)
Glucose-Capillary: 212 mg/dL — ABNORMAL HIGH (ref 70–99)
Glucose-Capillary: 121 mg/dL — ABNORMAL HIGH (ref 70–99)
Glucose-Capillary: 154 mg/dL — ABNORMAL HIGH (ref 70–99)

## 2022-03-17 MED ORDER — TRAMADOL HCL 50 MG PO TABS
50.0000 mg | ORAL_TABLET | Freq: Once | ORAL | Status: AC
  Administered 2022-03-17: 50 mg via ORAL
  Filled 2022-03-17: qty 1

## 2022-03-17 MED ORDER — FUROSEMIDE 10 MG/ML IJ SOLN
20.0000 mg | Freq: Every day | INTRAMUSCULAR | Status: DC
Start: 2022-03-17 — End: 2022-03-17

## 2022-03-17 MED ORDER — OXYCODONE HCL 5 MG PO TABS
5.0000 mg | ORAL_TABLET | ORAL | Status: DC | PRN
  Administered 2022-03-17 – 2022-03-18 (×2): 5 mg via ORAL
  Filled 2022-03-17 (×2): qty 1

## 2022-03-17 NOTE — Discharge Instructions (Signed)
Low-Sodium Nutrition Therapy Eating less sodium can help you if you have high blood pressure, heart failure, or kidney or liver disease. Your body needs a little sodium, but too much sodium can cause your body to hold onto extra water.  This extra water will raise your blood pressure and can cause damage to your heart, kidneys, or liver as they are forced to work harder. Sometimes you can see how the extra fluid affects you because your hands, legs, or belly swell.  You may also hold water around your heart and lungs, which makes it hard to breathe. Even if you take medication for blood pressure or a water pill (diuretic) to remove fluid, it is still important to have less salt in your diet. Check with your primary care provider before drinking alcohol since it may affect the amount of fluid in your body and how your heart, kidneys, or liver work.  Sodium in Food A low-sodium meal plan limits the sodium that you get from food and beverages to 1,500-2,000 milligrams (mg) per day. Salt is the main source of sodium.  Read the nutrition label on the package to find out how much sodium is in one serving of a food. Select foods with 140 milligrams (mg) of sodium or less per serving. You may be able to eat one or two servings of foods with a little more than 140 milligrams (mg) of sodium if you are closely watching how much sodium you eat in a day. Check the serving size on the label. The amount of sodium listed on the label shows the amount in one serving of the food. So, if you eat more than one serving, you will get more sodium than the amount listed.  Cutting Back on Sodium Eat more fresh foods. Fresh fruits and vegetables are low in sodium, as well as frozen vegetables and fruits that have no added juices or sauces. Fresh meats are lower in sodium than processed meats, such as bacon, sausage, and hotdogs. Not all processed foods are unhealthy, but some processed foods may have too much  sodium. Eat less salt at the table and when cooking.  One of the ingredients in salt is sodium. One teaspoon of table salt has 2,300 milligrams of sodium. Leave the salt out of recipes for pasta, casseroles, and soups. Be a smart shopper. Food packages that say "Salt-free", sodium-free", "very low sodium," and "low sodium" have less than 140 milligrams of sodium per serving. Beware of products identified as "Unsalted," "No Salt Added," "Reduced Sodium," or "Lower Sodium."  These items may still be high in sodium. You should always check the nutrition label. Add flavors to your food without adding sodium. Try lemon juice, lime juice, or vinegar. Dry or fresh herbs add flavor. Buy a sodium-free seasoning blend or make your own at home. You can purchase salt-free or sodium-free condiments like barbeque sauce in stores and online.   Eating in Restaurants Choose foods carefully when you eat outside your home. Restaurant foods can be very high in sodium.  Many restaurants provide nutrition facts on their menus or their websites. If you cannot find that information, ask your server.  Let your server know that you want your food to be cooked without salt and that you would like your salad dressing and sauces to be served on the side.  Foods Recommended Grains Bread and rolls without salted tops Homemade bread made with reduced-sodium baking powder Cold cereals, especially shredded wheat and puffed rice Oats, grits, or   cream of wheat Pastas, quinoa, and rice Popcorn, pretzels or crackers without salt Corn tortillas Protein Foods Fresh meats and fish (check the nutrition labels - make sure they are not packaged in a sodium solution) Canned or packed tuna (no more than 4 ounces at 1 serving) Beans and peas Soybeans and tofu Eggs Nuts or nut butters without salt Dairy Milk or milk powder Plant milks, such as rice and soy Yogurt, including Greek yogurt Small amounts of natural cheese  (blocks of cheese) or reduced-sodium cheese can be used in moderation.  (Swiss, ricotta, and fresh mozzarella cheese are lower in sodium than the others) Cream Cheese Low sodium cottage cheese Vegetables Fresh and frozen vegetables without added sauces or salt Homemade soups (without salt) Low-sodium, salt-free or sodium-free canned vegetables and soups Fruit Fresh and canned fruits Dried fruits, such as raisins, cranberries, and prunes Oils Tub or liquid margarine, regular or without salt Canola, corn, peanut, olive, safflower, or sunflower oils Condiments Fresh or dried herbs such as basil, bay leaf, dill, mustard (dry), nutmeg, paprika, parsley, rosemary, sage, or thyme. Low sodium ketchup Vinegar Lemon or lime juice Pepper, red pepper flakes, and cayenne. Hot sauce contains sodium, but if you use just a drop or two, it will not add up to much. Salt-free or sodium-free seasoning mixes and marinades Simple salad dressings: vinegar and oil  Foods Not Recommended Grains Breads or crackers topped with salt Cereals (hot/cold) with more than 300 mg sodium per serving Biscuits, cornbread, and other "quick" breads prepared with baking soda Pre-packaged bread crumbs Seasoned and packaged rice and pasta mixes Self-rising flours Protein Foods Cured meats: Bacon, ham, sausage, pepperoni and hot dogs Canned meats (chili, vienna sausage, or sardines) Smoked fish and meats Frozen meals that have more than 600 mg of sodium per serving Egg substitute (with added sodium) Dairy Buttermilk Processed cheese spreads Cottage cheese (1 cup may have over 500 mg of sodium; look for low-sodium.) American or feta cheese Shredded cheese has more sodium than blocks of cheese String cheese Vegetables Canned vegetables (unless they are salt-free, sodium-free or low sodium) Frozen vegetables with seasoning and sauces Sauerkraut and pickled vegetables Canned or dried soups (unless they are  salt-free, sodium-free, or low sodium) French fries and onion rings Fruit  Dried fruits preserved with additives that have sodium Oils  Salted butter or margarine, all types of olives Condiments Salt, sea salt, kosher salt, onion salt, and garlic salt Seasoning mixes with salt Bouillon cubes Ketchup Barbeque sauce and Worcestershire sauce unless low sodium Soy sauce Salsa, pickles, olives, relish Salad dressings: ranch, blue cheese, Italian, and French.    Carbohydrate Counting For People With Diabetes  Foods with carbohydrates make your blood glucose level go up. Learning how to count carbohydrates can help you control your blood glucose levels. First, identify the foods you eat that contain carbohydrates. Then, using the Foods with Carbohydrates chart, determine about how much carbohydrates are in your meals and snacks. Make sure you are eating foods with fiber, protein, and healthy fat along with your carbohydrate foods. Foods with Carbohydrates The following table shows carbohydrate foods that have about 15 grams of carbohydrate each. Using measuring cups, spoons, or a food scale when you first begin learning about carbohydrate counting can help you learn about the portion sizes you typically eat. The following foods have 15 grams carbohydrate each:  Grains 1 slice bread (1 ounce)  1 small tortilla (6-inch size)   large bagel (1 ounce)  1/3 cup pasta   or rice (cooked)   hamburger or hot dog bun ( ounce)   cup cooked cereal   to  cup ready-to-eat cereal  2 taco shells (5-inch size) Fruit 1 small fresh fruit ( to 1 cup)   medium banana  17 small grapes (3 ounces)  1 cup melon or berries   cup canned or frozen fruit  2 tablespoons dried fruit (blueberries, cherries, cranberries, raisins)   cup unsweetened fruit juice  Starchy Vegetables  cup cooked beans, peas, corn, potatoes/sweet potatoes   large baked potato (3 ounces)  1 cup acorn or butternut squash  Snack  Foods 3 to 6 crackers  8 potato chips or 13 tortilla chips ( ounce to 1 ounce)  3 cups popped popcorn  Dairy 3/4 cup (6 ounces) nonfat plain yogurt, or yogurt with sugar-free sweetener  1 cup milk  1 cup plain rice, soy, coconut or flavored almond milk Sweets and Desserts  cup ice cream or frozen yogurt  1 tablespoon jam, jelly, pancake syrup, table sugar, or honey  2 tablespoons light pancake syrup  1 inch square of frosted cake or 2 inch square of unfrosted cake  2 small cookies (2/3 ounce each) or  large cookie  Sometimes you'll have to estimate carbohydrate amounts if you don't know the exact recipe. One cup of mixed foods like soups can have 1 to 2 carbohydrate servings, while some casseroles might have 2 or more servings of carbohydrate. Foods that have less than 20 calories in each serving can be counted as "free" foods. Count 1 cup raw vegetables, or  cup cooked non-starchy vegetables as "free" foods. If you eat 3 or more servings at one meal, then count them as 1 carbohydrate serving.  Foods without Carbohydrates  Not all foods contain carbohydrates. Meat, some dairy, fats, non-starchy vegetables, and many beverages don't contain carbohydrate. So when you count carbohydrates, you can generally exclude chicken, pork, beef, fish, seafood, eggs, tofu, cheese, butter, sour cream, avocado, nuts, seeds, olives, mayonnaise, water, black coffee, unsweetened tea, and zero-calorie drinks. Vegetables with no or low carbohydrate include green beans, cauliflower, tomatoes, and onions. How much carbohydrate should I eat at each meal?  Carbohydrate counting can help you plan your meals and manage your weight. Following are some starting points for carbohydrate intake at each meal. Work with your registered dietitian nutritionist to find the best range that works for your blood glucose and weight.   To Lose Weight To Maintain Weight  Women 2 - 3 carb servings 3 - 4 carb servings  Men 3 - 4 carb  servings 4 - 5 carb servings  Checking your blood glucose after meals will help you know if you need to adjust the timing, type, or number of carbohydrate servings in your meal plan. Achieve and keep a healthy body weight by balancing your food intake and physical activity.  Tips How should I plan my meals?  Plan for half the food on your plate to include non-starchy vegetables, like salad greens, broccoli, or carrots. Try to eat 3 to 5 servings of non-starchy vegetables every day. Have a protein food at each meal. Protein foods include chicken, fish, meat, eggs, or beans (note that beans contain carbohydrate). These two food groups (non-starchy vegetables and proteins) are low in carbohydrate. If you fill up your plate with these foods, you will eat less carbohydrate but still fill up your stomach. Try to limit your carbohydrate portion to  of the plate.  What fats are healthiest   to eat?  Diabetes increases risk for heart disease. To help protect your heart, eat more healthy fats, such as olive oil, nuts, and avocado. Eat less saturated fats like butter, cream, and high-fat meats, like bacon and sausage. Avoid trans fats, which are in all foods that list "partially hydrogenated oil" as an ingredient. What should I drink?  Choose drinks that are not sweetened with sugar. The healthiest choices are water, carbonated or seltzer waters, and tea and coffee without added sugars.  Sweet drinks will make your blood glucose go up very quickly. One serving of soda or energy drink is  cup. It is best to drink these beverages only if your blood glucose is low.  Artificially sweetened, or diet drinks, typically do not increase your blood glucose if they have zero calories in them. Read labels of beverages, as some diet drinks do have carbohydrate and will raise your blood glucose. Label Reading Tips Read Nutrition Facts labels to find out how many grams of carbohydrate are in a food you want to eat. Don't forget:  sometimes serving sizes on the label aren't the same as how much food you are going to eat, so you may need to calculate how much carbohydrate is in the food you are serving yourself.   Carbohydrate Counting for People with Diabetes Sample 1-Day Menu  Breakfast  cup yogurt, low fat, low sugar (1 carbohydrate serving)   cup cereal, ready-to-eat, unsweetened (1 carbohydrate serving)  1 cup strawberries (1 carbohydrate serving)   cup almonds ( carbohydrate serving)  Lunch 1, 5 ounce can chunk light tuna  2 ounces cheese, low fat cheddar  6 whole wheat crackers (1 carbohydrate serving)  1 small apple (1 carbohydrate servings)   cup carrots ( carbohydrate serving)   cup snap peas  1 cup 1% milk (1 carbohydrate serving)   Evening Meal Stir fry made with: 3 ounces chicken  1 cup brown rice (3 carbohydrate servings)   cup broccoli ( carbohydrate serving)   cup green beans   cup onions  1 tablespoon olive oil  2 tablespoons teriyaki sauce ( carbohydrate serving)  Evening Snack 1 extra small banana (1 carbohydrate serving)  1 tablespoon peanut butter   Carbohydrate Counting for People with Diabetes Vegan Sample 1-Day Menu  Breakfast 1 cup cooked oatmeal (2 carbohydrate servings)   cup blueberries (1 carbohydrate serving)  2 tablespoons flaxseeds  1 cup soymilk fortified with calcium and vitamin D  1 cup coffee  Lunch 2 slices whole wheat bread (2 carbohydrate servings)   cup baked tofu   cup lettuce  2 slices tomato  2 slices avocado   cup baby carrots ( carbohydrate serving)  1 orange (1 carbohydrate serving)  1 cup soymilk fortified with calcium and vitamin D   Evening Meal Burrito made with: 1 6-inch corn tortilla (1 carbohydrate serving)  1 cup refried vegetarian beans (2 carbohydrate servings)   cup chopped tomatoes   cup lettuce   cup salsa  1/3 cup brown rice (1 carbohydrate serving)  1 tablespoon olive oil for rice   cup zucchini   Evening Snack 6  small whole grain crackers (1 carbohydrate serving)  2 apricots ( carbohydrate serving)   cup unsalted peanuts ( carbohydrate serving)    Carbohydrate Counting for People with Diabetes Vegetarian (Lacto-Ovo) Sample 1-Day Menu  Breakfast 1 cup cooked oatmeal (2 carbohydrate servings)   cup blueberries (1 carbohydrate serving)  2 tablespoons flaxseeds  1 egg  1 cup 1% milk (1   carbohydrate serving)  1 cup coffee  Lunch 2 slices whole wheat bread (2 carbohydrate servings)  2 ounces low-fat cheese   cup lettuce  2 slices tomato  2 slices avocado   cup baby carrots ( carbohydrate serving)  1 orange (1 carbohydrate serving)  1 cup unsweetened tea  Evening Meal Burrito made with: 1 6-inch corn tortilla (1 carbohydrate serving)   cup refried vegetarian beans (1 carbohydrate serving)   cup tomatoes   cup lettuce   cup salsa  1/3 cup brown rice (1 carbohydrate serving)  1 tablespoon olive oil for rice   cup zucchini  1 cup 1% milk (1 carbohydrate serving)  Evening Snack 6 small whole grain crackers (1 carbohydrate serving)  2 apricots ( carbohydrate serving)   cup unsalted peanuts ( carbohydrate serving)    Copyright 2020  [employer]. All rights reserved.  Using Nutrition Labels: Carbohydrate  Serving Size  Look at the serving size. All the information on the label is based on this portion. Servings Per Container  The number of servings contained in the package. Guidelines for Carbohydrate  Look at the total grams of carbohydrate in the serving size.  1 carbohydrate choice = 15 grams of carbohydrate. Range of Carbohydrate Grams Per Choice  Carbohydrate Grams/Choice Carbohydrate Choices  6-10   11-20 1  21-25 1  26-35 2  36-40 2  41-50 3  51-55 3  56-65 4  66-70 4  71-80 5    Copyright 2020  [employer]. All rights reserved.  

## 2022-03-17 NOTE — Consult Note (Signed)
Date of Admission:  03/13/2022          Reason for Consult: Cellulitis with sepsis and lymphangitic spread   Referring Provider: Blanchard Mane, MD   Assessment:  Sudden onset cellulitis with septic picture now with lymphangitic spread: I suspect this is due to a typical organism such as group A strep with the cellulitic component presenting a bit later rather than being a presentation of worsening infection.  Differential does though include unusual organism such as nocardia or nontuberculous mycobacteria Type 2 diabetes mellitus Hyperlipidemia Tinea pedis Hypertension  Plan:  Continue cefazolin at present Elevate leg Monitor area of cellulitis and lymphangitic spread and certainly if this worsens would consider repeat imaging with MRI Could also consider biopsy by surgery to be sent both to pathology for 1 specimen and then a separate specimen that is not touched by any preservatives that could be sent for AFB stain and culture to look for nontuberculous mycobacteria as well as nocardia  Principal Problem:   Sepsis due to undetermined organism (HCC) Active Problems:   HLD (hyperlipidemia)   Type 2 diabetes mellitus (HCC)   Chronic combined systolic and diastolic heart failure (HCC)   Tinea pedis of right foot   Cellulitis of right lower extremity   Hepatic steatosis   Aortic atherosclerosis (HCC)   Urolithiasis   Sepsis (HCC)   Scheduled Meds:  aspirin  325 mg Oral Daily   atorvastatin  40 mg Oral QHS   carvedilol  12.5 mg Oral BID WC   clotrimazole   Topical BID   dapagliflozin propanediol  10 mg Oral QAC breakfast   enoxaparin (LOVENOX) injection  60 mg Subcutaneous Q24H   escitalopram  10 mg Oral QPM   insulin aspart  0-20 Units Subcutaneous TID WC   metFORMIN  1,000 mg Oral BID WC   sacubitril-valsartan  1 tablet Oral BID   spironolactone  25 mg Oral Daily   Continuous Infusions:   ceFAZolin (ANCEF) IV 2 g (03/17/22 0551)   PRN Meds:.acetaminophen  **OR** acetaminophen, loperamide, ondansetron **OR** ondansetron (ZOFRAN) IV, mouth rinse  HPI: Jason Melendez is a 66 y.o. male for obesity prior cardiopulmonary arrest type 2 diabetes mellitus hyperlipidemia hypertension nonhemorrhagic CVA who developed acute onset of profuse chills sweats and fever up to 103 at home.  Developed blurry vision and became confused.  He was brought to the emergency department where he then began to notice right lower extremity erythema and swelling.  Blood cultures taken on admission he was started on antibiotics.  He was febrile to nearly 103 degrees on admission with leukocytosis on admission labs.  Since then he is clinically improved with resolution of his fevers, normalization of his white blood cell count.  He has been on cefazolin largely though he had a dose of vancomycin and ceftriaxone on admission.  In the interval he has noticed painful erythema posteriorly on his calf and also in his groin area consistent with a lymphangitic spread.  In looking through the pictures from Dr. Robb Matar on admission it did appeared on his picture the patient did have erythema posteriorly and anteriorly and posteriorly and that he already had lymphangitic spread on exam at that time.  Patient has had a CT of the leg with contrast that failed to show any evidence of deep infection such as pyomyositis.  I expect given the acute onset that this is most likely a infection with an organism such as group A strep or potentially group  B strep or MSSA.  For now I will keep him on IV antibiotics and re-examine him I suspect he will is showing a delay in some of the symptomatology of his lower extremity that was the source of his systemic infection but his global response to antibiotics is positive.  If he were to have further significant physical worsening on exam 1 could consider an MRI though I doubt it will be very useful at this moment in time with a normal CT contrast done  yesterday.  Another possibility would be an infection with an unusual organism such as nocardia or known nontuberculous mycobacteria.  These infections that would not present with a septic picture with his confusion and high fevers that responded promptly to a first generation cephalosporin.  If we wanted to entertain the diagnosis we would need punch biopsies for pathology and culture.  I expect we can change him to oral antibiotics soon.  I will ensure that he is screened for viral hepatitides and HIV.  I spent 84 minutes with the patient including than 50% of the time in face to face counseling of the patient and his wife and daughter regarding the nature of personally reviewing CT of the leg Dopplers along typical organi CBC CMPsms versus atypical organisms with review of medical records in preparation for the visit and during the visit and in coordination of his care.      Review of Systems: Review of Systems  Constitutional:  Positive for chills, fever and malaise/fatigue. Negative for weight loss.  HENT:  Negative for congestion and sore throat.   Eyes:  Negative for blurred vision and photophobia.  Respiratory:  Negative for cough, shortness of breath and wheezing.   Cardiovascular:  Negative for chest pain, palpitations and leg swelling.  Gastrointestinal:  Negative for abdominal pain, blood in stool, constipation, diarrhea, heartburn, melena, nausea and vomiting.  Genitourinary:  Negative for dysuria, flank pain and hematuria.  Musculoskeletal:  Positive for myalgias. Negative for back pain, falls and joint pain.  Skin:  Negative for itching and rash.  Neurological:  Positive for weakness. Negative for dizziness, focal weakness, loss of consciousness and headaches.  Endo/Heme/Allergies:  Does not bruise/bleed easily.  Psychiatric/Behavioral:  Negative for depression, substance abuse and suicidal ideas. The patient does not have insomnia.     Past Medical History:  Diagnosis  Date   Cardiopulmonary arrest (HCC)    Diabetes mellitus without complication (HCC)    History of CVA (cerebrovascular accident) 10/13/2016   HTN (hypertension)    Hyperlipidemia    Stroke (HCC)     Social History   Tobacco Use   Smoking status: Never   Smokeless tobacco: Never  Vaping Use   Vaping Use: Never used  Substance Use Topics   Alcohol use: No   Drug use: No    Family History  Problem Relation Age of Onset   Diabetes Mother    Heart disease Father 67       CABG   Diabetes Father    Kidney disease Father    Kidney failure Father    Diabetes Sister    Hypertension Sister    Allergies  Allergen Reactions   Hydralazine Anaphylaxis and Other (See Comments)    THE PATIENT CODED BECAUSE OF A SUDDEN DROP IN B/P!!!! HAD TO RECEIVE CPR!!    OBJECTIVE: Blood pressure (!) 141/72, pulse 89, temperature 99.1 F (37.3 C), temperature source Oral, resp. rate 18, height 6\' 2"  (1.88 m), weight 119.7 kg, SpO2 93 %.  Physical Exam Constitutional:      Appearance: He is well-developed.  HENT:     Head: Normocephalic and atraumatic.  Eyes:     Conjunctiva/sclera: Conjunctivae normal.  Cardiovascular:     Rate and Rhythm: Normal rate and regular rhythm.  Pulmonary:     Effort: Pulmonary effort is normal. No respiratory distress.     Breath sounds: No wheezing.  Abdominal:     General: There is no distension.     Palpations: Abdomen is soft.  Musculoskeletal:        General: No tenderness.     Cervical back: Normal range of motion and neck supple.  Skin:    General: Skin is warm and dry.     Coloration: Skin is not pale.     Findings: No erythema or rash.  Neurological:     General: No focal deficit present.     Mental Status: He is alert and oriented to person, place, and time.  Psychiatric:        Mood and Affect: Mood normal.        Behavior: Behavior normal.        Thought Content: Thought content normal.        Judgment: Judgment normal.    Right lower  extremity with cellulitis anteriorly and now posteriorly with lymphangitic spread up into the thigh March 17, 2022  Right anterior leg :     Posterior:     Lymphangitic spread:     Lab Results Lab Results  Component Value Date   WBC 4.7 03/16/2022   HGB 11.9 (L) 03/16/2022   HCT 37.1 (L) 03/16/2022   MCV 98.7 03/16/2022   PLT 147 (L) 03/16/2022    Lab Results  Component Value Date   CREATININE 0.92 03/16/2022   BUN 13 03/16/2022   NA 143 03/16/2022   K 4.2 03/16/2022   CL 112 (H) 03/16/2022   CO2 26 03/16/2022    Lab Results  Component Value Date   ALT 13 03/14/2022   AST 18 03/14/2022   ALKPHOS 24 (L) 03/14/2022   BILITOT 0.9 03/14/2022     Microbiology: Recent Results (from the past 240 hour(s))  Urine Culture     Status: None   Collection Time: 03/13/22 10:49 AM   Specimen: In/Out Cath Urine  Result Value Ref Range Status   Specimen Description   Final    IN/OUT CATH URINE Performed at [facility], [address], [place], [place] [address]    Special Requests   Final    NONE Performed at [facility], [address], [place], [place] [address]    Culture   Final    NO GROWTH Performed at [facility], [address], [place], [place] [address]    Report Status 03/14/2022 FINAL  Final  Blood Culture (routine x 2)     Status: None (Preliminary result)   Collection Time: 03/13/22 11:04 AM   Specimen: BLOOD  Result Value Ref Range Status   Specimen Description   Final    BLOOD BLOOD LEFT HAND Performed at [facility], [address] [place], [place] [address]    Special Requests   Final    BOTTLES DRAWN AEROBIC AND ANAEROBIC Blood Culture adequate volume Performed at [facility], [address] [place], [place] [address]    Culture   Final    NO GROWTH 3 DAYS Performed at [facility], [address], [place], [place] [address]    Report Status  PENDING  Incomplete  Blood Culture (routine x 2)     Status: None (Preliminary result)   Collection Time: 03/13/22 12:00 PM   Specimen: BLOOD  Result Value Ref Range Status   Specimen Description   Final    BLOOD RIGHT ANTECUBITAL Performed at [facility], [address], [place], [place] [address]    Special Requests   Final    BOTTLES DRAWN AEROBIC AND ANAEROBIC Blood Culture results may not be optimal due to an excessive volume of blood received in culture bottles Performed at [facility], [address] [place], [place] [address]    Culture   Final    NO GROWTH 3 DAYS Performed at [facility], [address], [place], [place] [address]    Report Status PENDING  Incomplete  Resp Panel by RT-PCR (Flu A&B, Covid) Anterior Nasal Swab     Status: None   Collection Time: 03/13/22  1:15 PM   Specimen: Anterior Nasal Swab  Result Value Ref Range Status   SARS Coronavirus 2 by RT PCR NEGATIVE NEGATIVE Final    Comment: (NOTE) SARS-CoV-2 target nucleic acids are NOT DETECTED.  The SARS-CoV-2 RNA is generally detectable in upper respiratory specimens during the acute phase of infection. The lowest concentration of SARS-CoV-2 viral copies this assay can detect is 138 copies/mL. A negative result does not preclude SARS-Cov-2 infection and should not be used as the sole basis for treatment or other patient management decisions. A negative result may occur with  improper specimen collection/handling, submission of specimen other than nasopharyngeal swab, presence of viral mutation(s) within the areas targeted by this assay, and inadequate number of viral copies(<138 copies/mL). A negative result must be combined with clinical observations, patient history, and epidemiological information. The expected result is Negative.  Fact Sheet for Patients:  BloggerCourse.com  Fact Sheet for Healthcare Providers:   SeriousBroker.it  This test is no t yet approved or cleared by the [place] FDA and  has been authorized for detection and/or diagnosis of SARS-CoV-2 by FDA under an Emergency Use Authorization (EUA). This EUA will remain  in effect (meaning this test can be used) for the duration of the COVID-19 declaration under Section 564(b)(1) of the Act, 21 U.S.C.section 360bbb-3(b)(1), unless the authorization is terminated  or revoked sooner.       Influenza A by PCR NEGATIVE NEGATIVE Final   Influenza B by PCR NEGATIVE NEGATIVE Final    Comment: (NOTE) The Xpert Xpress SARS-CoV-2/FLU/RSV plus assay is intended as an aid in the diagnosis of influenza from Nasopharyngeal swab specimens and should not be used as a sole basis for treatment. Nasal washings and aspirates are unacceptable for Xpert Xpress SARS-CoV-2/FLU/RSV testing.  Fact Sheet for Patients: BloggerCourse.com  Fact Sheet for Healthcare Providers: SeriousBroker.it  This test is not yet approved or cleared by the [place] FDA and has been authorized for detection and/or diagnosis of SARS-CoV-2 by FDA under an Emergency Use Authorization (EUA). This EUA will remain in effect (meaning this test can be used) for the duration of the COVID-19 declaration under Section 564(b)(1) of the Act, 21 U.S.C. section 360bbb-3(b)(1), unless the authorization is terminated or revoked.  Performed at [facility], [address] [place], [place] [address]   [name] Next Gen by PCR, Nasal     Status: None   Collection Time: 03/14/22  2:00 PM   Specimen: Nasal  Mucosa; Nasal Swab  Result Value Ref Range Status   [name] by PCR Next Gen NOT DETECTED NOT DETECTED Final    Comment: (NOTE) The GeneXpert [name] Assay (FDA approved for NASAL specimens only), is one component of a comprehensive [name] colonization surveillance program. It is not intended  to diagnose [name] infection nor to guide or monitor treatment for [name] infections. Test performance is not FDA approved in patients less than 743 years old. Performed at [facility], [address] [place], [place] [id number]     Acey Lavornelius Van Dam, MD [facility] [phone] pager  03/17/2022, 2:00 PM

## 2022-03-17 NOTE — Care Management Important Message (Signed)
Important Message  Patient Details IM Letter given to the Patient. Name: Jason Melendez MRN: [id number] Date of Birth: 11/27/1955   Medicare Important Message Given:  Yes     Caren Macadam 03/17/2022, 10:18 AM

## 2022-03-17 NOTE — TOC Progression Note (Signed)
Transition of Care [facility]) - Progression Note    Patient Details  Name: Jason Melendez MRN: [id number] Date of Birth: 12/14/1955  Transition of Care [facility]) CM/SW Contact  Armanda Heritage, RN Phone Number: 03/17/2022, 10:07 AM  Clinical Narrative:         Transition of Care Department [facility]) has reviewed patient and no TOC needs have been identified at this time. We will continue to monitor patient advancement through interdisciplinary progression rounds. If new patient transition needs arise, please place a TOC consult.

## 2022-03-17 NOTE — Progress Notes (Signed)
NUTRITION NOTE  Consult for diet education. Patient laying in bed with his wife and daughter at bedside. Patient is currently on a Heart Healthy/Carb Modified diet. At home, patient is mindful of salt intake and uses Lawry's salt substitute usually. He mainly drinks sparkling waters or water with flavoring drops added in.  Patient shares that in the past he has been told he has pre-diabetes and that his HgbA1c was 6.2%. No HgbA1c recording available in the chart at this time. He shares that he was checking CBGs at home for a short time in the past but no longer does as numbers were consistent. He takes metformin BID currently. He shares that he never received a formal dx of DM.   Talked with patient about the rationale of current diet order and things to be mindful of. Will place handouts in AVS so patient has them at d/c.  All questions and concerns addressed. If additional needs arise, please re-consult RD.      Trenton Gammon, MS, RD, LDN, CNSC Registered Dietitian II Inpatient Clinical Nutrition RD pager # and on-call/weekend pager # available in [facility]

## 2022-03-17 NOTE — Progress Notes (Addendum)
PROGRESS NOTE    Jason Melendez  ZOX:[id number] DOB: [birth year] DOA: 03/13/2022 PCP: Doreene Nest, NP   Brief Narrative: 66 year old male with history of type 2 diabetes, hypertension, hyperlipidemia, cardiopulmonary arrest and obesity, nonhemorrhagic CVA was sent to ER from urgent care due to hypotension.  He had fever at home of 103.  He was dizzy and was presyncope episode.  His vision turned blurry and grayish. He has chronic right lower extremity edema.  He is sent erythema to his right lower extremity and pain.  He denies any trauma.  He had never had cellulitis before.  Lactic acid was 4.4 on admission white count 10.7. Chest x-ray no acute cardiopulmonary disease on portable chest radiograph.    CT head without contrast with no acute intracranial normality.   CT renal study with stranding in the right groin with a large right groin lymph nodes, nonspecific but may be due to cellulitis.  There is bilateral perinephric stranding.  There was bilateral nonobstructing nephrolithiasis.  There is marked hepatic asteatosis 6.  There was aortic atherosclerosis.  No DVT was seen preliminary on Doppler ultrasound  Assessment & Plan:   Principal Problem:   Sepsis due to undetermined organism [facility]) Active Problems:   HLD (hyperlipidemia)   Type 2 diabetes mellitus (HCC)   Chronic combined systolic and diastolic heart failure (HCC)   Tinea pedis of right foot   Cellulitis of right lower extremity   Hepatic steatosis   Aortic atherosclerosis (HCC)   Urolithiasis   Sepsis (HCC)  #1  Severe sepsis present on admission secondary to right lower extremity cellulitis-still with increasing edema and erythema to the right lower extremity right calf. Patient presented with hypotension lactic acidosis and leukocytosis with endorgan damage such as AKI.  This is improved with IV fluids and antibiotics. Increasing erythema and edema noted in the right lower extremity.   [name]  negative Doppler no dvt Ct scan -skin thickening soft tissue edema no fluid collection or abscess. On ancef ID consulted  #2 type 2 diabetes on Farxiga and metformin prior to admission  CBG (last 3) continue SSI.a1c 6.2 Recent Labs    03/16/22 2202 03/17/22 0810 03/17/22 1109  GLUCAP 159* 144* 212*   Dietary consulted per patient request.  #3hyperlipedemia-on statin  # 4 chf stable-continue home medications including Entresto, Aldactone, Farxiga. Restart lasix  #5 depression on Lexapro  #6 AKI present on admit resolved   Estimated body mass index is 33.9 kg/m as calculated from the following:   Height as of this encounter:  (1.88 m).   Weight as of this encounter: 119.7 kg.  DVT prophylaxis: Lovenox  code Status: Full code  family Communication: Discussed with wife at bedside  disposition Plan:  Status is: Inpatient    Consultants:  None  Procedures: None Antimicrobials: Ancef Subjective  C/o increasing erythema edema and pain to right lower extremity  Objective: Vitals:   03/16/22 0629 03/16/22 1423 03/16/22 2004 03/17/22 0544  BP: 133/81 (!) 141/61 (!) 165/75 (!) 141/72  Pulse: 66 74 82 89  Resp: Temp: 98.2 F (36.8 C) 98.4 F (36.9 C) 99.4 F (37.4 C) 99.1 F (37.3 C)  TempSrc: Oral Oral Oral Oral  SpO2: 95% 97% 98% 93%  Weight:      Height:        Intake/Output Summary (Last 24 hours) at 03/17/2022 1356 Last data filed at 03/17/2022 0655 Gross per 24 hour  Intake 220 ml  Output 400 ml  Net -180 ml    Filed Weights   03/13/22 1056  Weight: 119.7 kg    Examination:  General exam: Appears in distress due to right lower extremity pain respiratory system: Clear to auscultation. Respiratory effort normal. Cardiovascular system: S1 & S2 heard, RRR. No JVD, murmurs, rubs, gallops or clicks. No pedal edema. Gastrointestinal system: Abdomen is nondistended, soft and nontender. No organomegaly or masses felt. Normal bowel  sounds heard. Central nervous system: Alert and oriented. No focal neurological deficits. Extremities: Right lower extremity with 1+ edema and edema and tenderness noted. Skin: No rashes, lesions or ulcers Psychiatry: Judgement and insight appear normal. Mood & affect appropriate.     Data Reviewed: I have personally reviewed following labs and imaging studies  CBC: Recent Labs  Lab 03/12/22 1623 03/13/22 1104 03/14/22 0439 03/16/22 0352  WBC 12.3* 10.7* 7.2 4.7  NEUTROABS 10.6* 9.2*  --   --   HGB 14.0 13.8 11.9* 11.9*  HCT 42.9 43.4 37.5* 37.1*  MCV 96.6 98.2 98.4 98.7  PLT 140* 146* 117* 147*    Basic Metabolic Panel: Recent Labs  Lab 03/12/22 1623 03/13/22 1104 03/14/22 0439 03/16/22 0352  NA 141 137 140 143  K 4.2 4.5 4.1 4.2  CL 105 104 108 112*  CO2 22 20* 23 26  GLUCOSE 166* 242* 124* 136*  BUN 16 21 21 13   CREATININE 1.13 1.53* 1.00 0.92  CALCIUM 9.7 9.4 8.8* 8.7*    GFR: Estimated Creatinine Clearance: 110.1 mL/min (by C-G formula based on SCr of 0.92 mg/dL). Liver Function Tests: Recent Labs  Lab 03/12/22 1623 03/13/22 1104 03/14/22 0439  AST 26 23 18   ALT 15 14 13   ALKPHOS 28* 27* 24*  BILITOT 1.7* 1.4* 0.9  PROT 6.8 7.3 6.2*  ALBUMIN 3.8 3.8 3.1*    No results for input(s): "LIPASE", "AMYLASE" in the last 168 hours. No results for input(s): "AMMONIA" in the last 168 hours. Coagulation Profile: Recent Labs  Lab 03/13/22 1104  INR 1.1    Cardiac Enzymes: No results for input(s): "CKTOTAL", "CKMB", "CKMBINDEX", "TROPONINI" in the last 168 hours. BNP (last 3 results) No results for input(s): "PROBNP" in the last 8760 hours. HbA1C: No results for input(s): "HGBA1C" in the last 72 hours. CBG: Recent Labs  Lab 03/16/22 1147 03/16/22 1645 03/16/22 2202 03/17/22 0810 03/17/22 1109  GLUCAP 112* 117* 159* 144* 212*    Lipid Profile: No results for input(s): "CHOL", "HDL", "LDLCALC", "TRIG", "CHOLHDL", "LDLDIRECT" in the last 72  hours. Thyroid Function Tests: No results for input(s): "TSH", "T4TOTAL", "FREET4", "T3FREE", "THYROIDAB" in the last 72 hours. Anemia Panel: No results for input(s): "VITAMINB12", "FOLATE", "FERRITIN", "TIBC", "IRON", "RETICCTPCT" in the last 72 hours. Sepsis Labs: Recent Labs  Lab 03/12/22 1623 03/13/22 1104 03/13/22 1259 03/13/22 1749  LATICACIDVEN 2.1* 4.4* 3.8* 1.5     Recent Results (from the past 240 hour(s))  Urine Culture     Status: None   Collection Time: 03/13/22 10:49 AM   Specimen: In/Out Cath Urine  Result Value Ref Range Status   Specimen Description   Final    IN/OUT CATH URINE Performed at [facility], [address] [place], [place] [address]    Special Requests   Final    NONE Performed at [facility], [address] [place], [place] [address]    Culture   Final    NO GROWTH Performed at [facility], [address]  [place], [place] [address]    Report Status 03/14/2022 FINAL  Final  Blood Culture (routine x 2)     Status: None (Preliminary result)   Collection Time: 03/13/22 11:04 AM   Specimen: BLOOD  Result Value Ref Range Status   Specimen Description   Final    BLOOD BLOOD LEFT HAND Performed at [facility], [address] [place], [place] [address]    Special Requests   Final    BOTTLES DRAWN AEROBIC AND ANAEROBIC Blood Culture adequate volume Performed at [facility], [address], [place], [place] [address]    Culture   Final    NO GROWTH 3 DAYS Performed at [facility], [address] [place], [place] [address]    Report Status PENDING  Incomplete  Blood Culture (routine x 2)     Status: None (Preliminary result)   Collection Time: 03/13/22 12:00 PM   Specimen: BLOOD  Result Value Ref Range Status   Specimen Description   Final    BLOOD RIGHT ANTECUBITAL Performed at [facility], [address], [place],  [place] [address]    Special Requests   Final    BOTTLES DRAWN AEROBIC AND ANAEROBIC Blood Culture results may not be optimal due to an excessive volume of blood received in culture bottles Performed at [facility], [address], [place], [place] [address]    Culture   Final    NO GROWTH 3 DAYS Performed at [facility], [address] [place], [place] [address]    Report Status PENDING  Incomplete  Resp Panel by RT-PCR (Flu A&B, Covid) Anterior Nasal Swab     Status: None   Collection Time: 03/13/22  1:15 PM   Specimen: Anterior Nasal Swab  Result Value Ref Range Status   SARS Coronavirus 2 by RT PCR NEGATIVE NEGATIVE Final    Comment: (NOTE) SARS-CoV-2 target nucleic acids are NOT DETECTED.  The SARS-CoV-2 RNA is generally detectable in upper respiratory specimens during the acute phase of infection. The lowest concentration of SARS-CoV-2 viral copies this assay can detect is 138 copies/mL. A negative result does not preclude SARS-Cov-2 infection and should not be used as the sole basis for treatment or other patient management decisions. A negative result may occur with  improper specimen collection/handling, submission of specimen other than nasopharyngeal swab, presence of viral mutation(s) within the areas targeted by this assay, and inadequate number of viral copies(<138 copies/mL). A negative result must be combined with clinical observations, patient history, and epidemiological information. The expected result is Negative.  Fact Sheet for Patients:  BloggerCourse.com  Fact Sheet for Healthcare Providers:  SeriousBroker.it  This test is no t yet approved or cleared by the [place] FDA and  has been authorized for detection and/or diagnosis of SARS-CoV-2 by FDA under an Emergency Use Authorization (EUA). This EUA will remain  in effect (meaning this test can be used) for the duration of  the COVID-19 declaration under Section 564(b)(1) of the Act, 21 U.S.C.section 360bbb-3(b)(1), unless the authorization is terminated  or revoked sooner.       Influenza A by PCR NEGATIVE NEGATIVE Final   Influenza B by PCR NEGATIVE NEGATIVE Final    Comment: (NOTE) The Xpert Xpress SARS-CoV-2/FLU/RSV plus assay is intended as an aid in the diagnosis of influenza from Nasopharyngeal swab specimens and should not be used as a sole basis for treatment. Nasal washings and aspirates are unacceptable for  Xpert Xpress SARS-CoV-2/FLU/RSV testing.  Fact Sheet for Patients: BloggerCourse.com  Fact Sheet for Healthcare Providers: SeriousBroker.it  This test is not yet approved or cleared by the [place] FDA and has been authorized for detection and/or diagnosis of SARS-CoV-2 by FDA under an Emergency Use Authorization (EUA). This EUA will remain in effect (meaning this test can be used) for the duration of the COVID-19 declaration under Section 564(b)(1) of the Act, 21 U.S.C. section 360bbb-3(b)(1), unless the authorization is terminated or revoked.  Performed at [facility], [address], [place], [place] [address]   [name] Next Gen by PCR, Nasal     Status: None   Collection Time: 03/14/22  2:00 PM   Specimen: Nasal Mucosa; Nasal Swab  Result Value Ref Range Status   [name] by PCR Next Gen NOT DETECTED NOT DETECTED Final    Comment: (NOTE) The GeneXpert [name] Assay (FDA approved for NASAL specimens only), is one component of a comprehensive [name] colonization surveillance program. It is not intended to diagnose [name] infection nor to guide or monitor treatment for [name] infections. Test performance is not FDA approved in patients less than 62 years old. Performed at [facility], [address], [place], [place] [address]          Radiology Studies: CT EXTREMITY LOWER RIGHT W  CONTRAST  Result Date: 03/16/2022 CLINICAL DATA:  Soft tissue infection suspected. Redness and pain from right knee down to the ankle. EXAM: CT OF THE LOWER RIGHT EXTREMITY WITH CONTRAST TECHNIQUE: Multidetector CT imaging of the lower right extremity was performed according to the standard protocol following intravenous contrast administration. RADIATION DOSE REDUCTION: This exam was performed according to the departmental dose-optimization program which includes automated exposure control, adjustment of the mA and/or kV according to patient size and/or use of iterative reconstruction technique. CONTRAST:  71mL OMNIPAQUE IOHEXOL 300 MG/ML  SOLN COMPARISON:  None Available. FINDINGS: Bones/Joint/Cartilage No evidence of fracture or osteonecrosis. Degenerative changes of the knee joint. No cortical erosion or periosteal reaction. Ligaments Suboptimally assessed by CT. Muscles and Tendons Muscles and tendons are intact. No intramuscular hematoma or fluid collection. Soft tissues Subcutaneous soft tissue edema and mild skin thickening prominent about the distal calf, ankle and dorsum of the foot. No drainable fluid collection or abscess. IMPRESSION: 1. Mild skin thickening, subcutaneous soft tissue edema in the distal leg, ankle and foot suggesting cellulitis. No fluid collection or abscess. 2. Muscles and tendons are intact. No intramuscular fluid collection. 3. No acute osseous abnormality. Degenerate changes of the knee joint. Electronically Signed   By: Larose Hires D.O.   On: 03/16/2022 18:28   VAS [place] LOWER EXTREMITY VENOUS (DVT)  Result Date: 03/16/2022  Lower Venous DVT Study Patient Name:  JAMARL PEW  Date of Exam:   03/16/2022 Medical Rec #: [id number]              Accession #:    [id number] Date of Birth: Dec 01, 1955             Patient Gender: M Patient Age:   32 years Exam Location:  [facility] Procedure:      VAS [place] LOWER EXTREMITY VENOUS (DVT) Referring Phys: Lanora Manis Deari Sessler  --------------------------------------------------------------------------------  Indications: Edema.  Limitations: Poor ultrasound/tissue interface. Comparison Study: 03/13/2022 - RIGHT:                   - There is no evidence of deep vein thrombosis in the lower  extremity.                    - No cystic structure found in the popliteal fossa.                   - Ultrasound characteristics of enlarged lymph nodes are noted                   in the                   groin.                    LEFT:                   - No evidence of common femoral vein obstruction. Performing Technologist: Chanda Busing RVT  Examination Guidelines: A complete evaluation includes B-mode imaging, spectral Doppler, color Doppler, and power Doppler as needed of all accessible portions of each vessel. Bilateral testing is considered an integral part of a complete examination. Limited examinations for reoccurring indications may be performed as noted. The reflux portion of the exam is performed with the patient in reverse Trendelenburg.  +---------+---------------+---------+-----------+----------+--------------+ RIGHT    CompressibilityPhasicitySpontaneityPropertiesThrombus Aging +---------+---------------+---------+-----------+----------+--------------+ CFV      Full           Yes      Yes                                 +---------+---------------+---------+-----------+----------+--------------+ SFJ      Full                                                        +---------+---------------+---------+-----------+----------+--------------+ FV Prox  Full                                                        +---------+---------------+---------+-----------+----------+--------------+ FV Mid   Full                                                        +---------+---------------+---------+-----------+----------+--------------+ FV DistalFull                                                         +---------+---------------+---------+-----------+----------+--------------+ PFV      Full                                                        +---------+---------------+---------+-----------+----------+--------------+ POP      Full           Yes  Yes                                 +---------+---------------+---------+-----------+----------+--------------+ PTV      Full                                                        +---------+---------------+---------+-----------+----------+--------------+ PERO     Full                                                        +---------+---------------+---------+-----------+----------+--------------+   +----+---------------+---------+-----------+----------+--------------+ LEFTCompressibilityPhasicitySpontaneityPropertiesThrombus Aging +----+---------------+---------+-----------+----------+--------------+ CFV Full           Yes      Yes                                 +----+---------------+---------+-----------+----------+--------------+     Summary: RIGHT: - There is no evidence of deep vein thrombosis in the lower extremity. However, portions of this examination were limited- see technologist comments above.  - No cystic structure found in the popliteal fossa.  LEFT: - No evidence of common femoral vein obstruction.  *See table(s) above for measurements and observations. Electronically signed by Lemar Livings MD on 03/16/2022 at 4:24:28 PM.    Final         Scheduled Meds:  aspirin  325 mg Oral Daily   atorvastatin  40 mg Oral QHS   carvedilol  12.5 mg Oral BID WC   clotrimazole   Topical BID   dapagliflozin propanediol  10 mg Oral QAC breakfast   enoxaparin (LOVENOX) injection  60 mg Subcutaneous Q24H   escitalopram  10 mg Oral QPM   insulin aspart  0-20 Units Subcutaneous TID WC   metFORMIN  1,000 mg Oral BID WC   sacubitril-valsartan  1 tablet Oral BID   spironolactone  25 mg Oral Daily    Continuous Infusions:   ceFAZolin (ANCEF) IV 2 g (03/17/22 0551)     LOS: 3 days    Time spent: 38 min  Alwyn Ren, MD 03/17/2022, 1:56 PM

## 2022-03-18 DIAGNOSIS — A419 Sepsis, unspecified organism: Secondary | ICD-10-CM | POA: Diagnosis not present

## 2022-03-18 DIAGNOSIS — A021 Salmonella sepsis: Secondary | ICD-10-CM

## 2022-03-18 DIAGNOSIS — L03115 Cellulitis of right lower limb: Secondary | ICD-10-CM | POA: Diagnosis not present

## 2022-03-18 DIAGNOSIS — I5042 Chronic combined systolic (congestive) and diastolic (congestive) heart failure: Secondary | ICD-10-CM | POA: Diagnosis not present

## 2022-03-18 DIAGNOSIS — K760 Fatty (change of) liver, not elsewhere classified: Secondary | ICD-10-CM | POA: Diagnosis not present

## 2022-03-18 LAB — CULTURE, BLOOD (ROUTINE X 2)
Culture: NO GROWTH
Culture: NO GROWTH
Special Requests: ADEQUATE

## 2022-03-18 LAB — COMPREHENSIVE METABOLIC PANEL
ALT: 12 U/L (ref 0–44)
AST: 17 U/L (ref 15–41)
Albumin: 2.9 g/dL — ABNORMAL LOW (ref 3.5–5.0)
Alkaline Phosphatase: 30 U/L — ABNORMAL LOW (ref 38–126)
Anion gap: 8 (ref 5–15)
BUN: 11 mg/dL (ref 8–23)
CO2: 26 mmol/L (ref 22–32)
Calcium: 9.1 mg/dL (ref 8.9–10.3)
Chloride: 106 mmol/L (ref 98–111)
Creatinine, Ser: 0.89 mg/dL (ref 0.61–1.24)
GFR, Estimated: >60 mL/min (ref >60)
Glucose, Bld: 132 mg/dL — ABNORMAL HIGH (ref 70–99)
Potassium: 3.7 mmol/L (ref 3.5–5.1)
Sodium: 140 mmol/L (ref 135–145)
Total Bilirubin: 0.8 mg/dL (ref 0.3–1.2)
Total Protein: 6.4 g/dL — ABNORMAL LOW (ref 6.5–8.1)

## 2022-03-18 LAB — CBC
HCT: 35.5 % — ABNORMAL LOW (ref 39.0–52.0)
Hemoglobin: 11.5 g/dL — ABNORMAL LOW (ref 13.0–17.0)
MCH: 31.6 pg (ref 26.0–34.0)
MCHC: 32.4 g/dL (ref 30.0–36.0)
MCV: 97.5 fL (ref 80.0–100.0)
Platelets: 171 10*3/uL (ref 150–400)
RBC: 3.64 MIL/uL — ABNORMAL LOW (ref 4.22–5.81)
RDW: 12.9 % (ref 11.5–15.5)
WBC: 9.1 10*3/uL (ref 4.0–10.5)
nRBC: 0 % (ref 0.0–0.2)

## 2022-03-18 LAB — GLUCOSE, CAPILLARY
Glucose-Capillary: 134 mg/dL — ABNORMAL HIGH (ref 70–99)
Glucose-Capillary: 135 mg/dL — ABNORMAL HIGH (ref 70–99)
Glucose-Capillary: 112 mg/dL — ABNORMAL HIGH (ref 70–99)
Glucose-Capillary: 148 mg/dL — ABNORMAL HIGH (ref 70–99)

## 2022-03-18 LAB — HEPATITIS A ANTIBODY, TOTAL: hep A Total Ab: NONREACTIVE

## 2022-03-18 LAB — HEPATITIS B SURFACE ANTIGEN: Hepatitis B Surface Ag: NONREACTIVE

## 2022-03-18 NOTE — Progress Notes (Signed)
Subjective:  Areas on posterior calf becoming more erythematous as well is lymphangitic areas and thigh   Antibiotics:  Anti-infectives (From admission, onward)    Start     Dose/Rate Route Frequency Ordered Stop   03/14/22 1400  vancomycin (VANCOREADY) IVPB 1500 mg/300 mL  Status:  Discontinued        1,500 mg 150 mL/hr over 120 Minutes Intravenous Every 24 hours 03/13/22 1444 03/14/22 1042   03/14/22 1400  ceFAZolin (ANCEF) IVPB 2g/100 mL premix        2 g 200 mL/hr over 30 Minutes Intravenous Every 8 hours 03/14/22 1043     03/13/22 1230  vancomycin (VANCOREADY) IVPB 2000 mg/400 mL        2,000 mg 200 mL/hr over 120 Minutes Intravenous  Once 03/13/22 1224 03/13/22 1529   03/13/22 1100  cefTRIAXone (ROCEPHIN) 2 g in sodium chloride 0.9 % 100 mL IVPB  Status:  Discontinued        2 g 200 mL/hr over 30 Minutes Intravenous Every 24 hours 03/13/22 1049 03/14/22 1042       Medications: Scheduled Meds:  aspirin  325 mg Oral Daily   atorvastatin  40 mg Oral QHS   carvedilol  12.5 mg Oral BID WC   clotrimazole   Topical BID   dapagliflozin propanediol  10 mg Oral QAC breakfast   enoxaparin (LOVENOX) injection  60 mg Subcutaneous Q24H   escitalopram  10 mg Oral QPM   insulin aspart  0-20 Units Subcutaneous TID WC   metFORMIN  1,000 mg Oral BID WC   sacubitril-valsartan  1 tablet Oral BID   spironolactone  25 mg Oral Daily   Continuous Infusions:   ceFAZolin (ANCEF) IV Stopped (03/18/22 0621)   PRN Meds:.acetaminophen **OR** acetaminophen, loperamide, ondansetron **OR** ondansetron (ZOFRAN) IV, mouth rinse, oxyCODONE    Objective: Weight change:   Intake/Output Summary (Last 24 hours) at 03/18/2022 1225 Last data filed at 03/18/2022 0800 Gross per 24 hour  Intake 540 ml  Output 300 ml  Net 240 ml   Blood pressure (!) 109/55, pulse 76, temperature 98.3 F (36.8 C), temperature source Oral, resp. rate 19, height 6\' 2"  (1.88 m), weight 119.7 kg, SpO2 92  %. Temp:  [97.7 F (36.5 C)-100.8 F (38.2 C)] 98.3 F (36.8 C) (07/19 1143) Pulse Rate:  [76-87] 76 (07/19 1143) Resp:  [16-19] 19 (07/19 1143) BP: (109-139)/(55-73) 109/55 (07/19 1143) SpO2:  [92 %-95 %] 92 % (07/19 1143)  Physical Exam: Physical Exam Constitutional:      Appearance: He is well-developed.  HENT:     Head: Normocephalic and atraumatic.  Eyes:     Conjunctiva/sclera: Conjunctivae normal.  Cardiovascular:     Rate and Rhythm: Normal rate and regular rhythm.  Pulmonary:     Effort: Pulmonary effort is normal. No respiratory distress.     Breath sounds: Normal breath sounds. No stridor. No wheezing.  Abdominal:     General: There is no distension.     Palpations: Abdomen is soft.  Musculoskeletal:     Cervical back: Normal range of motion and neck supple.  Skin:    General: Skin is warm and dry.     Findings: No erythema or rash.  Neurological:     General: No focal deficit present.     Mental Status: He is alert and oriented to person, place, and time.  Psychiatric:        Mood and Affect: Mood normal.  Behavior: Behavior normal.        Thought Content: Thought content normal.        Judgment: Judgment normal.       Right lower extremity 03/17/2022          "" 03/18/2022          CBC:    BMET Recent Labs    03/16/22 0352 03/18/22 0448  NA 143 140  K 4.2 3.7  CL 112* 106  CO2 26 26  GLUCOSE 136* 132*  BUN 13 11  CREATININE 0.92 0.89  CALCIUM 8.7* 9.1     Liver Panel  Recent Labs    03/18/22 0448  PROT 6.4*  ALBUMIN 2.9*  AST 17  ALT 12  ALKPHOS 30*  BILITOT 0.8       Sedimentation Rate No results for input(s): "ESRSEDRATE" in the last 72 hours. C-Reactive Protein No results for input(s): "CRP" in the last 72 hours.  Micro Results: Recent Results (from the past 720 hour(s))  Urine Culture     Status: None   Collection Time: 03/13/22 10:49 AM   Specimen: In/Out Cath Urine  Result Value Ref  Range Status   Specimen Description   Final    IN/OUT CATH URINE Performed at [facility], [place] [address], [place], [place] [address]    Special Requests   Final    NONE Performed at [facility], [place] [address], [place], [place] [address]    Culture   Final    NO GROWTH Performed at [facility], [place] [address], [place], [place] [address]    Report Status 03/14/2022 FINAL  Final  Blood Culture (routine x 2)     Status: None   Collection Time: 03/13/22 11:04 AM   Specimen: BLOOD  Result Value Ref Range Status   Specimen Description   Final    BLOOD BLOOD LEFT HAND Performed at [facility] [address], [place], [place] [address]    Special Requests   Final    BOTTLES DRAWN AEROBIC AND ANAEROBIC Blood Culture adequate volume Performed at [place] [address] [place], [place] [address]    Culture   Final    NO GROWTH 5 DAYS Performed at [facility], [place] [address] [place], [place] [address]    Report Status 03/18/2022 FINAL  Final  Blood Culture (routine x 2)     Status: None   Collection Time: 03/13/22 12:00 PM   Specimen: BLOOD  Result Value Ref Range Status   Specimen Description   Final    BLOOD RIGHT ANTECUBITAL Performed at [place] [address], [place], [place] [address]    Special Requests   Final    BOTTLES DRAWN AEROBIC AND ANAEROBIC Blood Culture results may not be optimal due to an excessive volume of blood received in culture bottles Performed at [place] [address] [place],  [address]    Culture   Final    NO GROWTH 5 DAYS Performed at [facility], [place] [address], [place],  [address]    Report Status 03/18/2022 FINAL  Final  Resp Panel by RT-PCR (Flu A&B, Covid) Anterior Nasal Swab     Status: None   Collection Time: 03/13/22  1:15 PM   Specimen: Anterior Nasal Swab  Result  Value Ref Range Status   SARS Coronavirus 2 by RT PCR NEGATIVE NEGATIVE Final  Comment: (NOTE) SARS-CoV-2 target nucleic acids are NOT DETECTED.  The SARS-CoV-2 RNA is generally detectable in upper respiratory specimens during the acute phase of infection. The lowest concentration of SARS-CoV-2 viral copies this assay can detect is 138 copies/mL. A negative result does not preclude SARS-Cov-2 infection and should not be used as the sole basis for treatment or other patient management decisions. A negative result may occur with  improper specimen collection/handling, submission of specimen other than nasopharyngeal swab, presence of viral mutation(s) within the areas targeted by this assay, and inadequate number of viral copies(<138 copies/mL). A negative result must be combined with clinical observations, patient history, and epidemiological information. The expected result is Negative.  Fact Sheet for Patients:  BloggerCourse.com  Fact Sheet for Healthcare Providers:  SeriousBroker.it  This test is no t yet approved or cleared by the [place] FDA and  has been authorized for detection and/or diagnosis of SARS-CoV-2 by FDA under an Emergency Use Authorization (EUA). This EUA will remain  in effect (meaning this test can be used) for the duration of the COVID-19 declaration under Section 564(b)(1) of the Act, 21 U.S.C.section 360bbb-3(b)(1), unless the authorization is terminated  or revoked sooner.       Influenza A by PCR NEGATIVE NEGATIVE Final   Influenza B by PCR NEGATIVE NEGATIVE Final    Comment: (NOTE) The Xpert Xpress SARS-CoV-2/FLU/RSV plus assay is intended as an aid in the diagnosis of influenza from Nasopharyngeal swab specimens and should not be used as a sole basis for treatment. Nasal washings and aspirates are unacceptable for Xpert Xpress SARS-CoV-2/FLU/RSV testing.  Fact Sheet for  Patients: BloggerCourse.com  Fact Sheet for Healthcare Providers: SeriousBroker.it  This test is not yet approved or cleared by the [place] FDA and has been authorized for detection and/or diagnosis of SARS-CoV-2 by FDA under an Emergency Use Authorization (EUA). This EUA will remain in effect (meaning this test can be used) for the duration of the COVID-19 declaration under Section 564(b)(1) of the Act, 21 U.S.C. section 360bbb-3(b)(1), unless the authorization is terminated or revoked.  Performed at [facility], [address], [place], [place] [address]   [name] Next Gen by PCR, Nasal     Status: None   Collection Time: 03/14/22  2:00 PM   Specimen: Nasal Mucosa; Nasal Swab  Result Value Ref Range Status   [name] by PCR Next Gen NOT DETECTED NOT DETECTED Final    Comment: (NOTE) The GeneXpert [name] Assay (FDA approved for NASAL specimens only), is one component of a comprehensive [name] colonization surveillance program. It is not intended to diagnose [name] infection nor to guide or monitor treatment for [name] infections. Test performance is not FDA approved in patients less than 70 years old. Performed at [facility], [address] [place], [place] [address]     Studies/Results: CT EXTREMITY LOWER RIGHT W CONTRAST  Result Date: 03/16/2022 CLINICAL DATA:  Soft tissue infection suspected. Redness and pain from right knee down to the ankle. EXAM: CT OF THE LOWER RIGHT EXTREMITY WITH CONTRAST TECHNIQUE: Multidetector CT imaging of the lower right extremity was performed according to the standard protocol following intravenous contrast administration. RADIATION DOSE REDUCTION: This exam was performed according to the departmental dose-optimization program which includes automated exposure control, adjustment of the mA and/or kV according to patient size and/or use of iterative reconstruction  technique. CONTRAST:  30mL OMNIPAQUE IOHEXOL 300 MG/ML  SOLN COMPARISON:  None Available. FINDINGS: Bones/Joint/Cartilage No evidence of fracture  or osteonecrosis. Degenerative changes of the knee joint. No cortical erosion or periosteal reaction. Ligaments Suboptimally assessed by CT. Muscles and Tendons Muscles and tendons are intact. No intramuscular hematoma or fluid collection. Soft tissues Subcutaneous soft tissue edema and mild skin thickening prominent about the distal calf, ankle and dorsum of the foot. No drainable fluid collection or abscess. IMPRESSION: 1. Mild skin thickening, subcutaneous soft tissue edema in the distal leg, ankle and foot suggesting cellulitis. No fluid collection or abscess. 2. Muscles and tendons are intact. No intramuscular fluid collection. 3. No acute osseous abnormality. Degenerate changes of the knee joint. Electronically Signed   By: Larose Hires D.O.   On: 03/16/2022 18:28   VAS [place] LOWER EXTREMITY VENOUS (DVT)  Result Date: 03/16/2022  Lower Venous DVT Study Patient Name:  Jason Melendez  Date of Exam:   03/16/2022 Medical Rec #: [id number]              Accession #:    [id number] Date of Birth: [birth year]             Patient Gender: M Patient Age:   66 years Exam Location:  [facility] Procedure:      VAS [place] LOWER EXTREMITY VENOUS (DVT) Referring Phys: Lanora Manis MATHEWS --------------------------------------------------------------------------------  Indications: Edema.  Limitations: Poor ultrasound/tissue interface. Comparison Study: 03/13/2022 - RIGHT:                   - There is no evidence of deep vein thrombosis in the lower                   extremity.                    - No cystic structure found in the popliteal fossa.                   - Ultrasound characteristics of enlarged lymph nodes are noted                   in the                   groin.                    LEFT:                   - No evidence of common femoral vein obstruction. Performing  Technologist: Chanda Busing RVT  Examination Guidelines: A complete evaluation includes B-mode imaging, spectral Doppler, color Doppler, and power Doppler as needed of all accessible portions of each vessel. Bilateral testing is considered an integral part of a complete examination. Limited examinations for reoccurring indications may be performed as noted. The reflux portion of the exam is performed with the patient in reverse Trendelenburg.  +---------+---------------+---------+-----------+----------+--------------+ RIGHT    CompressibilityPhasicitySpontaneityPropertiesThrombus Aging +---------+---------------+---------+-----------+----------+--------------+ CFV      Full           Yes      Yes                                 +---------+---------------+---------+-----------+----------+--------------+ SFJ      Full                                                        +---------+---------------+---------+-----------+----------+--------------+  FV Prox  Full                                                        +---------+---------------+---------+-----------+----------+--------------+ FV Mid   Full                                                        +---------+---------------+---------+-----------+----------+--------------+ FV DistalFull                                                        +---------+---------------+---------+-----------+----------+--------------+ PFV      Full                                                        +---------+---------------+---------+-----------+----------+--------------+ POP      Full           Yes      Yes                                 +---------+---------------+---------+-----------+----------+--------------+ PTV      Full                                                        +---------+---------------+---------+-----------+----------+--------------+ PERO     Full                                                         +---------+---------------+---------+-----------+----------+--------------+   +----+---------------+---------+-----------+----------+--------------+ LEFTCompressibilityPhasicitySpontaneityPropertiesThrombus Aging +----+---------------+---------+-----------+----------+--------------+ CFV Full           Yes      Yes                                 +----+---------------+---------+-----------+----------+--------------+     Summary: RIGHT: - There is no evidence of deep vein thrombosis in the lower extremity. However, portions of this examination were limited- see technologist comments above.  - No cystic structure found in the popliteal fossa.  LEFT: - No evidence of common femoral vein obstruction.  *See table(s) above for measurements and observations. Electronically signed by Lemar Livings MD on 03/16/2022 at 4:24:28 PM.    Final       Assessment/Plan:  INTERVAL HISTORY: Areas of erythema have increased in intensity   Principal Problem:   Sepsis due to undetermined organism [facility]) Active Problems:   HLD (hyperlipidemia)   Type 2 diabetes mellitus (HCC)  Chronic combined systolic and diastolic heart failure (HCC)   Tinea pedis of right foot   Cellulitis of right lower extremity   Hepatic steatosis   Aortic atherosclerosis ([place])   Urolithiasis   Sepsis (HCC)    Jason Melendez is a 66 y.o. male with history of diabetes mellitus hypertension who was admitted with a septic picture due to cellulitis and who responded to appropriate antibiotics for cellulitis in the form of cefazolin in terms of his clinical global response.  He has however had worsening erythema in the cellulitic areas including especially posterior calf and now area of lymphangitic spread in his thigh.  I continue to suspect that this process is due to a more typical organism such as group A strep group B strep or MSSA.  Again he clinically seems to have responded in terms of his fever  and encephalopathy though he does have continued ongoing worsening of his erythema I wonder if this is just a lag in the development of this part of his pathology.  I certainly do not see indication for broader antibiotics at this time  I would recommend to continue the course with cefazolin for now.  We may end up repeat imaging with an MRI if he continues to have ongoing worsening of his erythema and swelling.  The yield on it though would be not high only 2 days after CT with contrast  As described yesterday could also contemplate NTM and nocardia though I have not seen them present with a septic picture.  I spent 36 minutes with the patient including than 50% of the time in face to face counseling of the patient his wife Santiago Glad the nature of cellulitis and different organisms that could be responsible  along with review of medical records in preparation for the visit and during the visit and in coordination of his care.     LOS: 4 days   Alcide Evener 03/18/2022, 12:25 PM

## 2022-03-18 NOTE — Progress Notes (Signed)
PROGRESS NOTE    Jason Melendez  NWG:[id number] DOB: 05/22/1956 DOA: 03/13/2022 PCP: Doreene Nest, NP   Brief Narrative: 66 year old male with history of type 2 diabetes, hypertension, hyperlipidemia, cardiopulmonary arrest and obesity, nonhemorrhagic CVA was sent to ER from urgent care due to hypotension.  He had fever at home of 103.  He was dizzy and was presyncope episode.  His vision turned blurry and grayish. He has chronic right lower extremity edema.  He is sent erythema to his right lower extremity and pain.  He denies any trauma.  He had never had cellulitis before.  Lactic acid was 4.4 on admission white count 10.7. Chest x-ray no acute cardiopulmonary disease on portable chest radiograph.    CT head without contrast with no acute intracranial normality.   CT renal study with stranding in the right groin with a large right groin lymph nodes, nonspecific but may be due to cellulitis.  There is bilateral perinephric stranding.  There was bilateral nonobstructing nephrolithiasis.  There is marked hepatic asteatosis 6.  There was aortic atherosclerosis.  No DVT was seen preliminary on Doppler ultrasound  Assessment & Plan:   Principal Problem:   Sepsis due to undetermined organism [facility]) Active Problems:   HLD (hyperlipidemia)   Type 2 diabetes mellitus (HCC)   Chronic combined systolic and diastolic heart failure (HCC)   Tinea pedis of right foot   Cellulitis of right lower extremity   Hepatic steatosis   Aortic atherosclerosis (HCC)   Urolithiasis   Sepsis (HCC)  #1  Severe sepsis present on admission secondary to right lower extremity cellulitis-still with increasing edema and erythema to the right lower extremity right calf. Patient presented with hypotension lactic acidosis and leukocytosis with endorgan damage such as AKI.  This is improved with IV fluids and antibiotics. Increasing erythema and edema noted in the right lower extremity.   [name]  negative Doppler no dvt Ct scan -skin thickening soft tissue edema no fluid collection or abscess. On ancef ID following  #2 type 2 diabetes on Farxiga and metformin prior to admission  CBG (last 3) continue SSI.a1c 6.2 Recent Labs    03/17/22 2117 03/18/22 0715 03/18/22 1143  GLUCAP 154* 134* 135*  Dietary consulted per patient request.  #3hyperlipedemia-on statin  # 4 chf stable-continue home medications including Entresto, Aldactone, Farxiga.   #5 depression on Lexapro  #6 AKI present on admit resolved   Estimated body mass index is 33.9 kg/m as calculated from the following:   Height as of this encounter: 6\' 2"  (1.88 m).   Weight as of this encounter: 119.7 kg.  DVT prophylaxis: Lovenox  code Status: Full code  family Communication: Discussed with wife at bedside  disposition Plan:  Status is: Inpatient    Consultants:  Infectious disease  Procedures: None Antimicrobials: Ancef Subjective  Continues to have pain in right lower leg, which limits his ability to stand. Has noted varying degrees of erythema on right leg.  Objective: Vitals:   03/17/22 1536 03/17/22 2205 03/18/22 0506 03/18/22 1143  BP: 131/68 139/61 138/73 (!) 109/55  Pulse: 81 87 79 76  Resp: 16 19 18 19   Temp: 100.2 F (37.9 C) (!) 100.8 F (38.2 C) 97.7 F (36.5 C) 98.3 F (36.8 C)  TempSrc: Oral Oral Oral Oral  SpO2: 94% 93% 95% 92%  Weight:      Height:        Intake/Output Summary (Last 24 hours) at 03/18/2022 1643 Last data filed at  03/18/2022 0800 Gross per 24 hour  Intake 440 ml  Output --  Net 440 ml   Filed Weights   03/13/22 1056  Weight: 119.7 kg    Examination:  General exam: Appears in distress due to right lower extremity pain  respiratory system: Clear to auscultation. Respiratory effort normal. Cardiovascular system: S1 & S2 heard, RRR. No JVD, murmurs, rubs, gallops or clicks. No pedal edema. Gastrointestinal system: Abdomen is nondistended, soft and  nontender. No organomegaly or masses felt. Normal bowel sounds heard. Central nervous system: Alert and oriented. No focal neurological deficits. Extremities: Right lower extremity with 1+ edema and edema and tenderness noted. Skin: splotchy erythema noted over right foot, right lower leg and some on inner right thigh Psychiatry: Judgement and insight appear normal. Mood & affect appropriate.     Data Reviewed: I have personally reviewed following labs and imaging studies  CBC: Recent Labs  Lab 03/12/22 1623 03/13/22 1104 03/14/22 0439 03/16/22 0352 03/18/22 0448  WBC 12.3* 10.7* 7.2 4.7 9.1  NEUTROABS 10.6* 9.2*  --   --   --   HGB 14.0 13.8 11.9* 11.9* 11.5*  HCT 42.9 43.4 37.5* 37.1* 35.5*  MCV 96.6 98.2 98.4 98.7 97.5  PLT 140* 146* 117* 147* [id number]   Basic Metabolic Panel: Recent Labs  Lab 03/12/22 1623 03/13/22 1104 03/14/22 0439 03/16/22 0352 03/18/22 0448  NA 141 137 140 143 140  K 4.2 4.5 4.1 4.2 3.7  CL 105 104 108 112* 106  CO2 22 20* 23 26 26   GLUCOSE 166* 242* 124* 136* 132*  BUN 16 21 21 13 11   CREATININE 1.13 1.53* 1.00 0.92 0.89  CALCIUM 9.7 9.4 8.8* 8.7* 9.1   GFR: Estimated Creatinine Clearance: 113.8 mL/min (by C-G formula based on SCr of 0.89 mg/dL). Liver Function Tests: Recent Labs  Lab 03/12/22 1623 03/13/22 1104 03/14/22 0439 03/18/22 0448  AST 26 23 18 17   ALT 15 14 13 12   ALKPHOS 28* 27* 24* 30*  BILITOT 1.7* 1.4* 0.9 0.8  PROT 6.8 7.3 6.2* 6.4*  ALBUMIN 3.8 3.8 3.1* 2.9*   No results for input(s): "LIPASE", "AMYLASE" in the last 168 hours. No results for input(s): "AMMONIA" in the last 168 hours. Coagulation Profile: Recent Labs  Lab 03/13/22 1104  INR 1.1   Cardiac Enzymes: No results for input(s): "CKTOTAL", "CKMB", "CKMBINDEX", "TROPONINI" in the last 168 hours. BNP (last 3 results) No results for input(s): "PROBNP" in the last 8760 hours. HbA1C: No results for input(s): "HGBA1C" in the last 72 hours. CBG: Recent Labs   Lab 03/17/22 1109 03/17/22 1642 03/17/22 2117 03/18/22 0715 03/18/22 1143  GLUCAP 212* 121* 154* 134* 135*   Lipid Profile: No results for input(s): "CHOL", "HDL", "LDLCALC", "TRIG", "CHOLHDL", "LDLDIRECT" in the last 72 hours. Thyroid Function Tests: No results for input(s): "TSH", "T4TOTAL", "FREET4", "T3FREE", "THYROIDAB" in the last 72 hours. Anemia Panel: No results for input(s): "VITAMINB12", "FOLATE", "FERRITIN", "TIBC", "IRON", "RETICCTPCT" in the last 72 hours. Sepsis Labs: Recent Labs  Lab 03/12/22 1623 03/13/22 1104 03/13/22 1259 03/13/22 1749  LATICACIDVEN 2.1* 4.4* 3.8* 1.5    Recent Results (from the past 240 hour(s))  Urine Culture     Status: None   Collection Time: 03/13/22 10:49 AM   Specimen: In/Out Cath Urine  Result Value Ref Range Status   Specimen Description   Final    IN/OUT CATH URINE Performed at [facility], [place] [address], [place], [place] [address]    Special Requests  Final    NONE Performed at [facility], [address] [place], [place] [address]    Culture   Final    NO GROWTH Performed at [facility], [address] [place], [place] [address]    Report Status 03/14/2022 FINAL  Final  Blood Culture (routine x 2)     Status: None   Collection Time: 03/13/22 11:04 AM   Specimen: BLOOD  Result Value Ref Range Status   Specimen Description   Final    BLOOD BLOOD LEFT HAND Performed at [facility], [address] [place], [place] [address]    Special Requests   Final    BOTTLES DRAWN AEROBIC AND ANAEROBIC Blood Culture adequate volume Performed at [facility], [address] [place], [place] [address]    Culture   Final    NO GROWTH 5 DAYS Performed at [facility], [address], [place], [place] [address]    Report Status 03/18/2022 FINAL  Final  Blood Culture (routine x 2)     Status: None   Collection Time: 03/13/22 12:00 PM    Specimen: BLOOD  Result Value Ref Range Status   Specimen Description   Final    BLOOD RIGHT ANTECUBITAL Performed at [facility], [address], [place], [place] [address]    Special Requests   Final    BOTTLES DRAWN AEROBIC AND ANAEROBIC Blood Culture results may not be optimal due to an excessive volume of blood received in culture bottles Performed at [facility], [address], [place], [place] [address]    Culture   Final    NO GROWTH 5 DAYS Performed at [facility], [address] [place], [place] [address]    Report Status 03/18/2022 FINAL  Final  Resp Panel by RT-PCR (Flu A&B, Covid) Anterior Nasal Swab     Status: None   Collection Time: 03/13/22  1:15 PM   Specimen: Anterior Nasal Swab  Result Value Ref Range Status   SARS Coronavirus 2 by RT PCR NEGATIVE NEGATIVE Final    Comment: (NOTE) SARS-CoV-2 target nucleic acids are NOT DETECTED.  The SARS-CoV-2 RNA is generally detectable in upper respiratory specimens during the acute phase of infection. The lowest concentration of SARS-CoV-2 viral copies this assay can detect is 138 copies/mL. A negative result does not preclude SARS-Cov-2 infection and should not be used as the sole basis for treatment or other patient management decisions. A negative result may occur with  improper specimen collection/handling, submission of specimen other than nasopharyngeal swab, presence of viral mutation(s) within the areas targeted by this assay, and inadequate number of viral copies(<138 copies/mL). A negative result must be combined with clinical observations, patient history, and epidemiological information. The expected result is Negative.  Fact Sheet for Patients:  BloggerCourse.com  Fact Sheet for Healthcare Providers:  SeriousBroker.it  This test is no t yet approved or cleared by the [place] FDA and  has been  authorized for detection and/or diagnosis of SARS-CoV-2 by FDA under an Emergency Use Authorization (EUA). This EUA will remain  in effect (meaning this test can be used) for the duration of the COVID-19 declaration under Section 564(b)(1) of the Act, 21 U.S.C.section 360bbb-3(b)(1), unless the authorization is terminated  or revoked sooner.       Influenza A by PCR NEGATIVE NEGATIVE Final   Influenza B by PCR NEGATIVE NEGATIVE Final    Comment: (NOTE)  The Xpert Xpress SARS-CoV-2/FLU/RSV plus assay is intended as an aid in the diagnosis of influenza from Nasopharyngeal swab specimens and should not be used as a sole basis for treatment. Nasal washings and aspirates are unacceptable for Xpert Xpress SARS-CoV-2/FLU/RSV testing.  Fact Sheet for Patients: EntrepreneurPulse.com.au  Fact Sheet for Healthcare Providers: IncredibleEmployment.be  This test is not yet approved or cleared by the [place] FDA and has been authorized for detection and/or diagnosis of SARS-CoV-2 by FDA under an Emergency Use Authorization (EUA). This EUA will remain in effect (meaning this test can be used) for the duration of the COVID-19 declaration under Section 564(b)(1) of the Act, 21 U.S.C. section 360bbb-3(b)(1), unless the authorization is terminated or revoked.  Performed at [facility], [place] [address] [place], [place] [address]   [name] Next Gen by PCR, Nasal     Status: None   Collection Time: 03/14/22  2:00 PM   Specimen: Nasal Mucosa; Nasal Swab  Result Value Ref Range Status   [name] by PCR Next Gen NOT DETECTED NOT DETECTED Final    Comment: (NOTE) The GeneXpert [name] Assay (FDA approved for NASAL specimens only), is one component of a comprehensive [name] colonization surveillance program. It is not intended to diagnose [name] infection nor to guide or monitor treatment for [name] infections. Test performance is not FDA approved in  patients less than 84 years old. Performed at [facility], [place] [address] [place], [place] [address]          Radiology Studies: CT EXTREMITY LOWER RIGHT W CONTRAST  Result Date: 03/16/2022 CLINICAL DATA:  Soft tissue infection suspected. Redness and pain from right knee down to the ankle. EXAM: CT OF THE LOWER RIGHT EXTREMITY WITH CONTRAST TECHNIQUE: Multidetector CT imaging of the lower right extremity was performed according to the standard protocol following intravenous contrast administration. RADIATION DOSE REDUCTION: This exam was performed according to the departmental dose-optimization program which includes automated exposure control, adjustment of the mA and/or kV according to patient size and/or use of iterative reconstruction technique. CONTRAST:  23mL OMNIPAQUE IOHEXOL 300 MG/ML  SOLN COMPARISON:  None Available. FINDINGS: Bones/Joint/Cartilage No evidence of fracture or osteonecrosis. Degenerative changes of the knee joint. No cortical erosion or periosteal reaction. Ligaments Suboptimally assessed by CT. Muscles and Tendons Muscles and tendons are intact. No intramuscular hematoma or fluid collection. Soft tissues Subcutaneous soft tissue edema and mild skin thickening prominent about the distal calf, ankle and dorsum of the foot. No drainable fluid collection or abscess. IMPRESSION: 1. Mild skin thickening, subcutaneous soft tissue edema in the distal leg, ankle and foot suggesting cellulitis. No fluid collection or abscess. 2. Muscles and tendons are intact. No intramuscular fluid collection. 3. No acute osseous abnormality. Degenerate changes of the knee joint. Electronically Signed   By: Keane Police D.O.   On: 03/16/2022 18:28        Scheduled Meds:  aspirin  325 mg Oral Daily   atorvastatin  40 mg Oral QHS   carvedilol  12.5 mg Oral BID WC   clotrimazole   Topical BID   dapagliflozin propanediol  10 mg Oral QAC breakfast   enoxaparin (LOVENOX)  injection  60 mg Subcutaneous Q24H   escitalopram  10 mg Oral QPM   insulin aspart  0-20 Units Subcutaneous TID WC   metFORMIN  1,000 mg Oral BID WC   sacubitril-valsartan  1 tablet Oral BID   spironolactone  25 mg Oral Daily   Continuous Infusions:  ceFAZolin (ANCEF) IV 2 g (03/18/22 1452)     LOS: 4 days    Time spent: 35 min  Kathie Dike, MD 03/18/2022, 4:43 PM

## 2022-03-19 DIAGNOSIS — I5042 Chronic combined systolic (congestive) and diastolic (congestive) heart failure: Secondary | ICD-10-CM | POA: Diagnosis not present

## 2022-03-19 DIAGNOSIS — A419 Sepsis, unspecified organism: Secondary | ICD-10-CM | POA: Diagnosis not present

## 2022-03-19 DIAGNOSIS — K760 Fatty (change of) liver, not elsewhere classified: Secondary | ICD-10-CM | POA: Diagnosis not present

## 2022-03-19 DIAGNOSIS — L03115 Cellulitis of right lower limb: Secondary | ICD-10-CM | POA: Diagnosis not present

## 2022-03-19 LAB — BASIC METABOLIC PANEL
Anion gap: 7 (ref 5–15)
BUN: 12 mg/dL (ref 8–23)
CO2: 28 mmol/L (ref 22–32)
Calcium: 8.6 mg/dL — ABNORMAL LOW (ref 8.9–10.3)
Chloride: 106 mmol/L (ref 98–111)
Creatinine, Ser: 1.12 mg/dL (ref 0.61–1.24)
GFR, Estimated: >60 mL/min (ref >60)
Glucose, Bld: 122 mg/dL — ABNORMAL HIGH (ref 70–99)
Potassium: 4 mmol/L (ref 3.5–5.1)
Sodium: 141 mmol/L (ref 135–145)

## 2022-03-19 LAB — CBC
HCT: 35.7 % — ABNORMAL LOW (ref 39.0–52.0)
Hemoglobin: 11.3 g/dL — ABNORMAL LOW (ref 13.0–17.0)
MCH: 31 pg (ref 26.0–34.0)
MCHC: 31.7 g/dL (ref 30.0–36.0)
MCV: 98.1 fL (ref 80.0–100.0)
Platelets: 187 10*3/uL (ref 150–400)
RBC: 3.64 MIL/uL — ABNORMAL LOW (ref 4.22–5.81)
RDW: 12.8 % (ref 11.5–15.5)
WBC: 6.8 10*3/uL (ref 4.0–10.5)
nRBC: 0 % (ref 0.0–0.2)

## 2022-03-19 LAB — GLUCOSE, CAPILLARY
Glucose-Capillary: 138 mg/dL — ABNORMAL HIGH (ref 70–99)
Glucose-Capillary: 115 mg/dL — ABNORMAL HIGH (ref 70–99)
Glucose-Capillary: 144 mg/dL — ABNORMAL HIGH (ref 70–99)
Glucose-Capillary: 138 mg/dL — ABNORMAL HIGH (ref 70–99)

## 2022-03-19 LAB — HCV AB W REFLEX TO QUANT PCR: HCV Ab: NONREACTIVE

## 2022-03-19 LAB — HEPATITIS B SURFACE ANTIBODY, QUANTITATIVE: Hep B S AB Quant (Post): <3.1 m[IU]/mL — ABNORMAL LOW (ref >9.9)

## 2022-03-19 LAB — HCV INTERPRETATION

## 2022-03-19 MED ORDER — CEFADROXIL 500 MG PO CAPS
1000.0000 mg | ORAL_CAPSULE | Freq: Two times a day (BID) | ORAL | Status: DC
  Administered 2022-03-19 – 2022-03-20 (×2): 1000 mg via ORAL
  Filled 2022-03-19 (×3): qty 2

## 2022-03-19 MED ORDER — TERBINAFINE HCL 250 MG PO TABS
250.0000 mg | ORAL_TABLET | Freq: Every day | ORAL | Status: DC
  Administered 2022-03-19 – 2022-03-20 (×2): 250 mg via ORAL
  Filled 2022-03-19 (×2): qty 1

## 2022-03-19 NOTE — Progress Notes (Signed)
Subjective: Erythema is improving Antibiotics:  Anti-infectives (From admission, onward)    Start     Dose/Rate Route Frequency Ordered Stop   03/19/22 1145  terbinafine (LAMISIL) tablet 250 mg        250 mg Oral Daily 03/19/22 1045     03/14/22 1400  vancomycin (VANCOREADY) IVPB 1500 mg/300 mL  Status:  Discontinued        1,500 mg 150 mL/hr over 120 Minutes Intravenous Every 24 hours 03/13/22 1444 03/14/22 1042   03/14/22 1400  ceFAZolin (ANCEF) IVPB 2g/100 mL premix        2 g 200 mL/hr over 30 Minutes Intravenous Every 8 hours 03/14/22 1043     03/13/22 1230  vancomycin (VANCOREADY) IVPB 2000 mg/400 mL        2,000 mg 200 mL/hr over 120 Minutes Intravenous  Once 03/13/22 1224 03/13/22 1529   03/13/22 1100  cefTRIAXone (ROCEPHIN) 2 g in sodium chloride 0.9 % 100 mL IVPB  Status:  Discontinued        2 g 200 mL/hr over 30 Minutes Intravenous Every 24 hours 03/13/22 1049 03/14/22 1042       Medications: Scheduled Meds:  aspirin  325 mg Oral Daily   atorvastatin  40 mg Oral QHS   carvedilol  12.5 mg Oral BID WC   clotrimazole   Topical BID   dapagliflozin propanediol  10 mg Oral QAC breakfast   enoxaparin (LOVENOX) injection  60 mg Subcutaneous Q24H   escitalopram  10 mg Oral QPM   insulin aspart  0-20 Units Subcutaneous TID WC   metFORMIN  1,000 mg Oral BID WC   sacubitril-valsartan  1 tablet Oral BID   spironolactone  25 mg Oral Daily   terbinafine  250 mg Oral Daily   Continuous Infusions:   ceFAZolin (ANCEF) IV 2 g (03/19/22 0541)   PRN Meds:.acetaminophen **OR** acetaminophen, loperamide, ondansetron **OR** ondansetron (ZOFRAN) IV, mouth rinse, oxyCODONE    Objective: Weight change:   Intake/Output Summary (Last 24 hours) at 03/19/2022 1158 Last data filed at 03/19/2022 0900 Gross per 24 hour  Intake 780 ml  Output 300 ml  Net 480 ml    Blood pressure 130/72, pulse 74, temperature 98.7 F (37.1 C), temperature source Oral, resp. rate (!) 21,  height 6\' 2"  (1.88 m), weight 119.7 kg, SpO2 94 %. Temp:  [97.8 F (36.6 C)-99.3 F (37.4 C)] 98.7 F (37.1 C) (07/20 1140) Pulse Rate:  [70-79] 74 (07/20 1140) Resp:  [18-21] 21 (07/20 1140) BP: (126-142)/(55-72) 130/72 (07/20 1140) SpO2:  [94 %-97 %] 94 % (07/20 1140)  Physical Exam: Physical Exam Constitutional:      Appearance: He is well-developed.  HENT:     Head: Normocephalic and atraumatic.  Eyes:     Conjunctiva/sclera: Conjunctivae normal.  Cardiovascular:     Rate and Rhythm: Normal rate and regular rhythm.  Pulmonary:     Effort: Pulmonary effort is normal. No respiratory distress.     Breath sounds: Normal breath sounds. No stridor. No wheezing.  Abdominal:     General: There is no distension.     Palpations: Abdomen is soft.  Musculoskeletal:        General: Swelling present.     Cervical back: Normal range of motion and neck supple.  Skin:    General: Skin is warm and dry.     Findings: No erythema or rash.  Neurological:     General: No focal deficit present.  Mental Status: He is alert and oriented to person, place, and time.  Psychiatric:        Mood and Affect: Mood normal.        Behavior: Behavior normal.        Thought Content: Thought content normal.        Judgment: Judgment normal.       Right lower extremity 03/17/2022          "" 03/18/2022        03/19/2022:       CBC:    BMET Recent Labs    03/18/22 0448 03/19/22 0432  NA 140 141  K 3.7 4.0  CL 106 106  CO2 26 28  GLUCOSE 132* 122*  BUN 11 12  CREATININE 0.89 1.12  CALCIUM 9.1 8.6*      Liver Panel  Recent Labs    03/18/22 0448  PROT 6.4*  ALBUMIN 2.9*  AST 17  ALT 12  ALKPHOS 30*  BILITOT 0.8        Sedimentation Rate No results for input(s): "ESRSEDRATE" in the last 72 hours. C-Reactive Protein No results for input(s): "CRP" in the last 72 hours.  Micro Results: Recent Results (from the past 720 hour(s))  Urine Culture      Status: None   Collection Time: 03/13/22 10:49 AM   Specimen: In/Out Cath Urine  Result Value Ref Range Status   Specimen Description   Final    IN/OUT CATH URINE Performed at [facility], [place] [address], [place], [place] [address]    Special Requests   Final    NONE Performed at [facility], [place] [address], [place], [place] [address]    Culture   Final    NO GROWTH Performed at [facility], [place] [address] [place], [place] [address]    Report Status 03/14/2022 FINAL  Final  Blood Culture (routine x 2)     Status: None   Collection Time: 03/13/22 11:04 AM   Specimen: BLOOD  Result Value Ref Range Status   Specimen Description   Final    BLOOD BLOOD LEFT HAND Performed at [facility] [address] [place], [place] [address]    Special Requests   Final    BOTTLES DRAWN AEROBIC AND ANAEROBIC Blood Culture adequate volume Performed at [facility] [address] [place], [place] [address]    Culture   Final    NO GROWTH 5 DAYS Performed at [facility], [place] [address], [place], [place] [address]    Report Status 03/18/2022 FINAL  Final  Blood Culture (routine x 2)     Status: None   Collection Time: 03/13/22 12:00 PM   Specimen: BLOOD  Result Value Ref Range Status   Specimen Description   Final    BLOOD RIGHT ANTECUBITAL Performed at [facility] [address] [place], [place] [address]    Special Requests   Final    BOTTLES DRAWN AEROBIC AND ANAEROBIC Blood Culture results may not be optimal due to an excessive volume of blood received in culture bottles Performed at [facility] [address], [place], [place] [address]    Culture   Final    NO GROWTH 5 DAYS Performed at [facility], [place] [address] [place], [place] [address]    Report Status 03/18/2022 FINAL  Final  Resp Panel by RT-PCR (Flu A&B, Covid) Anterior Nasal  Swab     Status: None   Collection Time: 03/13/22  1:15 PM   Specimen: Anterior Nasal Swab  Result Value Ref Range Status   SARS Coronavirus 2 by RT PCR NEGATIVE NEGATIVE Final    Comment: (NOTE) SARS-CoV-2 target nucleic acids are NOT DETECTED.  The SARS-CoV-2 RNA is generally detectable in upper respiratory specimens during the acute phase of infection. The lowest concentration of SARS-CoV-2 viral copies this assay can detect is 138 copies/mL. A negative result does not preclude SARS-Cov-2 infection and should not be used as the sole basis for treatment or other patient management decisions. A negative result may occur with  improper specimen collection/handling, submission of specimen other than nasopharyngeal swab, presence of viral mutation(s) within the areas targeted by this assay, and inadequate number of viral copies(<138 copies/mL). A negative result must be combined with clinical observations, patient history, and epidemiological information. The expected result is Negative.  Fact Sheet for Patients:  BloggerCourse.com  Fact Sheet for Healthcare Providers:  SeriousBroker.it  This test is no t yet approved or cleared by the [place] FDA and  has been authorized for detection and/or diagnosis of SARS-CoV-2 by FDA under an Emergency Use Authorization (EUA). This EUA will remain  in effect (meaning this test can be used) for the duration of the COVID-19 declaration under Section 564(b)(1) of the Act, 21 U.S.C.section 360bbb-3(b)(1), unless the authorization is terminated  or revoked sooner.       Influenza A by PCR NEGATIVE NEGATIVE Final   Influenza B by PCR NEGATIVE NEGATIVE Final    Comment: (NOTE) The Xpert Xpress SARS-CoV-2/FLU/RSV plus assay is intended as an aid in the diagnosis of influenza from Nasopharyngeal swab specimens and should not be used as a sole basis for treatment. Nasal washings and aspirates  are unacceptable for Xpert Xpress SARS-CoV-2/FLU/RSV testing.  Fact Sheet for Patients: BloggerCourse.com  Fact Sheet for Healthcare Providers: SeriousBroker.it  This test is not yet approved or cleared by the [place] FDA and has been authorized for detection and/or diagnosis of SARS-CoV-2 by FDA under an Emergency Use Authorization (EUA). This EUA will remain in effect (meaning this test can be used) for the duration of the COVID-19 declaration under Section 564(b)(1) of the Act, 21 U.S.C. section 360bbb-3(b)(1), unless the authorization is terminated or revoked.  Performed at [facility], [address], [place], [place] [address]   [name] Next Gen by PCR, Nasal     Status: None   Collection Time: 03/14/22  2:00 PM   Specimen: Nasal Mucosa; Nasal Swab  Result Value Ref Range Status   [name] by PCR Next Gen NOT DETECTED NOT DETECTED Final    Comment: (NOTE) The GeneXpert [name] Assay (FDA approved for NASAL specimens only), is one component of a comprehensive [name] colonization surveillance program. It is not intended to diagnose [name] infection nor to guide or monitor treatment for [name] infections. Test performance is not FDA approved in patients less than 14 years old. Performed at [facility], [address] [place], [place] [address]     Studies/Results: No results found.    Assessment/Plan:  INTERVAL HISTORY: Erythematous areas are improving  Principal Problem:   Sepsis due to undetermined organism [facility]) Active Problems:   HLD (hyperlipidemia)   Type 2 diabetes mellitus (HCC)   Chronic combined systolic and diastolic heart failure (HCC)   Tinea pedis of right foot   Cellulitis of right lower extremity   Hepatic steatosis   Aortic  atherosclerosis ([place])   Urolithiasis   Sepsis ([place])    Jason Melendez is a 65 y.o. male with history of diabetes mellitus  hypertension who was admitted with a septic picture due to cellulitis and who responded to appropriate antibiotics for cellulitis in the form of cefazolin in terms of his clinical global response.  He has however had worsening erythema in the cellulitic areas including especially posterior calf and now area of lymphangitic spread in his thigh.  I continue to suspect that this process is due to a more typical organism such as group A strep group B strep or MSSA.  Again he clinically seems to have responded in terms of his fever and encephalopathy though he does have continued ongoing worsening of his erythema I have wondered if this is just a lag in the development of this part of his pathology.  It indeed appears to be the case and his erythema is now improving.  We have discontinue cefazolin and change him over to to cefadroxil 500 mg tablets twice daily.  He can complete a total of 2 weeks though this may indeed be overkill.  I have warned about the risk of recurrence of cellulitis once a person has had it  Onychomycosis would give him Lamisil to treat his onychomycosis for 3 months.  He can follow-up with [place] in clinic as needed.  I will sign off for now please call with further questions.   LOS: 5 days   Alcide Evener 03/19/2022, 11:58 AM

## 2022-03-19 NOTE — TOC Progression Note (Signed)
Transition of Care [facility]) - Progression Note    Patient Details  Name: Jason Melendez MRN: [id number] Date of Birth: [birth year]  Transition of Care [facility]) CM/SW Contact  Armanda Heritage, RN Phone Number: 03/19/2022, 1:54 PM  Clinical Narrative:    CM noted patient has been ordered DME RW, referral given to [employer] rep Danielle, to be delivered to bedside.         Expected Discharge Plan and Services                           DME Arranged: [place] rolling DME Agency: [employer] Date DME Agency Contacted: 03/19/22 Time DME Agency Contacted: [phone] Representative spoke with at DME Agency: Duwayne Heck             Social Determinants of Health (SDOH) Interventions    Readmission Risk Interventions     No data to display

## 2022-03-19 NOTE — Progress Notes (Signed)
PROGRESS NOTE    Jason Melendez  WNU:[id number] DOB: July 16, 1956 DOA: 03/13/2022 PCP: Jason Nest, NP   Brief Narrative: 66 year old male with history of type 2 diabetes, hypertension, hyperlipidemia, cardiopulmonary arrest and obesity, nonhemorrhagic CVA was sent to ER from urgent care due to hypotension.  He had fever at home of 103.  He was dizzy and was presyncope episode.  His vision turned blurry and grayish. He has chronic right lower extremity edema.  He is sent erythema to his right lower extremity and pain.  He denies any trauma.  He had never had cellulitis before.  Lactic acid was 4.4 on admission white count 10.7. Chest x-ray no acute cardiopulmonary disease on portable chest radiograph.    CT head without contrast with no acute intracranial normality.   CT renal study with stranding in the right groin with a large right groin lymph nodes, nonspecific but may be due to cellulitis.  There is bilateral perinephric stranding.  There was bilateral nonobstructing nephrolithiasis.  There is marked hepatic asteatosis 6.  There was aortic atherosclerosis.  No DVT was seen preliminary on Doppler ultrasound  Assessment & Plan:   Principal Problem:   Sepsis due to undetermined organism [facility]) Active Problems:   HLD (hyperlipidemia)   Type 2 diabetes mellitus (HCC)   Chronic combined systolic and diastolic heart failure (HCC)   Tinea pedis of right foot   Cellulitis of right lower extremity   Hepatic steatosis   Aortic atherosclerosis (HCC)   Urolithiasis   Sepsis (HCC)  #1  Severe sepsis present on admission secondary to right lower extremity cellulitis-still with increasing edema and erythema to the right lower extremity right calf. Patient presented with hypotension lactic acidosis and leukocytosis with endorgan damage such as AKI.  This is improved with IV fluids and antibiotics. Increasing erythema and edema noted in the right lower extremity.   [name]  negative Doppler no dvt Ct scan -skin thickening soft tissue edema no fluid collection or abscess. On ancef ID following We will likely be transition to cefadroxil Plan to discharge home on antibiotic therapy once pain is reasonably managed and is able to ambulate  #2 type 2 diabetes on Farxiga and metformin prior to admission  CBG (last 3) continue SSI.a1c 6.2 Recent Labs    03/19/22 0735 03/19/22 1141 03/19/22 1701  GLUCAP 138* 115* 144*  Dietary consulted per patient request.  #3hyperlipedemia-on statin  # 4 chf stable-continue home medications including Entresto, Aldactone, Farxiga.   #5 depression on Lexapro  #6 AKI present on admit resolved   Estimated body mass index is 33.9 kg/m as calculated from the following:   Height as of this encounter: 6\' 2"  (1.88 m).   Weight as of this encounter: 119.7 kg.  DVT prophylaxis: Lovenox  code Status: Full code  family Communication: Discussed with wife at bedside  disposition Plan:  Status is: Inpatient    Consultants:  Infectious disease  Procedures: None Antimicrobials: Ancef Subjective  Feels the pain in his right lower leg may be a little better today.  Able to stand a little bit longer when he went to the bathroom.  Objective: Vitals:   03/18/22 1143 03/18/22 2118 03/19/22 0604 03/19/22 1140  BP: (!) 109/55 (!) 126/55 (!) 142/68 130/72  Pulse: 76 79 70 74  Resp: 19 18 18  (!) 21  Temp: 98.3 F (36.8 C) 99.3 F (37.4 C) 97.8 F (36.6 C) 98.7 F (37.1 C)  TempSrc: Oral Oral Oral Oral  SpO2:  92% 96% 97% 94%  Weight:      Height:        Intake/Output Summary (Last 24 hours) at 03/19/2022 1951 Last data filed at 03/19/2022 1340 Gross per 24 hour  Intake 800 ml  Output 300 ml  Net 500 ml   Filed Weights   03/13/22 1056  Weight: 119.7 kg    Examination:  General exam: Appears in distress due to right lower extremity pain  respiratory system: Clear to auscultation. Respiratory effort  normal. Cardiovascular system: S1 & S2 heard, RRR. No JVD, murmurs, rubs, gallops or clicks. No pedal edema. Gastrointestinal system: Abdomen is nondistended, soft and nontender. No organomegaly or masses felt. Normal bowel sounds heard. Central nervous system: Alert and oriented. No focal neurological deficits. Extremities: Right lower extremity with erythema that seems to be improving Skin: splotchy erythema noted over right foot, right lower leg and some on inner right thigh Psychiatry: Judgement and insight appear normal. Mood & affect appropriate.     Data Reviewed: I have personally reviewed following labs and imaging studies  CBC: Recent Labs  Lab 03/13/22 1104 03/14/22 0439 03/16/22 0352 03/18/22 0448 03/19/22 0432  WBC 10.7* 7.2 4.7 9.1 6.8  NEUTROABS 9.2*  --   --   --   --   HGB 13.8 11.9* 11.9* 11.5* 11.3*  HCT 43.4 37.5* 37.1* 35.5* 35.7*  MCV 98.2 98.4 98.7 97.5 98.1  PLT 146* 117* 147* 171 187   Basic Metabolic Panel: Recent Labs  Lab 03/13/22 1104 03/14/22 0439 03/16/22 0352 03/18/22 0448 03/19/22 0432  NA 137 140 143 140 141  K 4.5 4.1 4.2 3.7 4.0  CL 104 108 112* 106 106  CO2 20* 23 26 26 28   GLUCOSE 242* 124* 136* 132* 122*  BUN 21 21 13 11 12   CREATININE 1.53* 1.00 0.92 0.89 1.12  CALCIUM 9.4 8.8* 8.7* 9.1 8.6*   GFR: Estimated Creatinine Clearance: 90.4 mL/min (by C-G formula based on SCr of 1.12 mg/dL). Liver Function Tests: Recent Labs  Lab 03/13/22 1104 03/14/22 0439 03/18/22 0448  AST 23 18 17   ALT 14 13 12   ALKPHOS 27* 24* 30*  BILITOT 1.4* 0.9 0.8  PROT 7.3 6.2* 6.4*  ALBUMIN 3.8 3.1* 2.9*   No results for input(s): "LIPASE", "AMYLASE" in the last 168 hours. No results for input(s): "AMMONIA" in the last 168 hours. Coagulation Profile: Recent Labs  Lab 03/13/22 1104  INR 1.1   Cardiac Enzymes: No results for input(s): "CKTOTAL", "CKMB", "CKMBINDEX", "TROPONINI" in the last 168 hours. BNP (last 3 results) No results for  input(s): "PROBNP" in the last 8760 hours. HbA1C: No results for input(s): "HGBA1C" in the last 72 hours. CBG: Recent Labs  Lab 03/18/22 1658 03/18/22 2120 03/19/22 0735 03/19/22 1141 03/19/22 1701  GLUCAP 112* 148* 138* 115* 144*   Lipid Profile: No results for input(s): "CHOL", "HDL", "LDLCALC", "TRIG", "CHOLHDL", "LDLDIRECT" in the last 72 hours. Thyroid Function Tests: No results for input(s): "TSH", "T4TOTAL", "FREET4", "T3FREE", "THYROIDAB" in the last 72 hours. Anemia Panel: No results for input(s): "VITAMINB12", "FOLATE", "FERRITIN", "TIBC", "IRON", "RETICCTPCT" in the last 72 hours. Sepsis Labs: Recent Labs  Lab 03/13/22 1104 03/13/22 1259 03/13/22 1749  LATICACIDVEN 4.4* 3.8* 1.5    Recent Results (from the past 240 hour(s))  Urine Culture     Status: None   Collection Time: 03/13/22 10:49 AM   Specimen: In/Out Cath Urine  Result Value Ref Range Status   Specimen Description   Final  IN/OUT CATH URINE Performed at [facility], [address] [place], [place] [address]    Special Requests   Final    NONE Performed at [facility], [address], [place], [place] [address]    Culture   Final    NO GROWTH Performed at [facility], [address] [place], [place] [address]    Report Status 03/14/2022 FINAL  Final  Blood Culture (routine x 2)     Status: None   Collection Time: 03/13/22 11:04 AM   Specimen: BLOOD  Result Value Ref Range Status   Specimen Description   Final    BLOOD BLOOD LEFT HAND Performed at [facility], [address], [place], [place] [address]    Special Requests   Final    BOTTLES DRAWN AEROBIC AND ANAEROBIC Blood Culture adequate volume Performed at [facility], [address] [place], [place] [address]    Culture   Final    NO GROWTH 5 DAYS Performed at [facility], [address] [place], [place] [address]    Report Status  03/18/2022 FINAL  Final  Blood Culture (routine x 2)     Status: None   Collection Time: 03/13/22 12:00 PM   Specimen: BLOOD  Result Value Ref Range Status   Specimen Description   Final    BLOOD RIGHT ANTECUBITAL Performed at [facility], [address], [place], [place] [address]    Special Requests   Final    BOTTLES DRAWN AEROBIC AND ANAEROBIC Blood Culture results may not be optimal due to an excessive volume of blood received in culture bottles Performed at [facility], [address], [place], [place] [address]    Culture   Final    NO GROWTH 5 DAYS Performed at [facility], [address], [place], [place] [address]    Report Status 03/18/2022 FINAL  Final  Resp Panel by RT-PCR (Flu A&B, Covid) Anterior Nasal Swab     Status: None   Collection Time: 03/13/22  1:15 PM   Specimen: Anterior Nasal Swab  Result Value Ref Range Status   SARS Coronavirus 2 by RT PCR NEGATIVE NEGATIVE Final    Comment: (NOTE) SARS-CoV-2 target nucleic acids are NOT DETECTED.  The SARS-CoV-2 RNA is generally detectable in upper respiratory specimens during the acute phase of infection. The lowest concentration of SARS-CoV-2 viral copies this assay can detect is 138 copies/mL. A negative result does not preclude SARS-Cov-2 infection and should not be used as the sole basis for treatment or other patient management decisions. A negative result may occur with  improper specimen collection/handling, submission of specimen other than nasopharyngeal swab, presence of viral mutation(s) within the areas targeted by this assay, and inadequate number of viral copies(<138 copies/mL). A negative result must be combined with clinical observations, patient history, and epidemiological information. The expected result is Negative.  Fact Sheet for Patients:  BloggerCourse.com  Fact Sheet for Healthcare Providers:   SeriousBroker.it  This test is no t yet approved or cleared by the [place] FDA and  has been authorized for detection and/or diagnosis of SARS-CoV-2 by FDA under an Emergency Use Authorization (EUA). This EUA will remain  in effect (meaning this test can be used) for the duration of the COVID-19 declaration under Section 564(b)(1) of the Act, 21 U.S.C.section 360bbb-3(b)(1), unless the authorization is terminated  or revoked sooner.  Influenza A by PCR NEGATIVE NEGATIVE Final   Influenza B by PCR NEGATIVE NEGATIVE Final    Comment: (NOTE) The Xpert Xpress SARS-CoV-2/FLU/RSV plus assay is intended as an aid in the diagnosis of influenza from Nasopharyngeal swab specimens and should not be used as a sole basis for treatment. Nasal washings and aspirates are unacceptable for Xpert Xpress SARS-CoV-2/FLU/RSV testing.  Fact Sheet for Patients: BloggerCourse.com  Fact Sheet for Healthcare Providers: SeriousBroker.it  This test is not yet approved or cleared by the [place] FDA and has been authorized for detection and/or diagnosis of SARS-CoV-2 by FDA under an Emergency Use Authorization (EUA). This EUA will remain in effect (meaning this test can be used) for the duration of the COVID-19 declaration under Section 564(b)(1) of the Act, 21 U.S.C. section 360bbb-3(b)(1), unless the authorization is terminated or revoked.  Performed at [facility], [address] [place], [place] [address]   [name] Next Gen by PCR, Nasal     Status: None   Collection Time: 03/14/22  2:00 PM   Specimen: Nasal Mucosa; Nasal Swab  Result Value Ref Range Status   [name] by PCR Next Gen NOT DETECTED NOT DETECTED Final    Comment: (NOTE) The GeneXpert [name] Assay (FDA approved for NASAL specimens only), is one component of a comprehensive [name] colonization surveillance program. It is not intended  to diagnose [name] infection nor to guide or monitor treatment for [name] infections. Test performance is not FDA approved in patients less than 29 years old. Performed at [facility], [address], [place], [place] [address]          Radiology Studies: No results found.      Scheduled Meds:  aspirin  325 mg Oral Daily   atorvastatin  40 mg Oral QHS   carvedilol  12.5 mg Oral BID WC   cefadroxil  1,000 mg Oral BID   clotrimazole   Topical BID   dapagliflozin propanediol  10 mg Oral QAC breakfast   enoxaparin (LOVENOX) injection  60 mg Subcutaneous Q24H   escitalopram  10 mg Oral QPM   insulin aspart  0-20 Units Subcutaneous TID WC   metFORMIN  1,000 mg Oral BID WC   sacubitril-valsartan  1 tablet Oral BID   spironolactone  25 mg Oral Daily   terbinafine  250 mg Oral Daily   Continuous Infusions:     LOS: 5 days    Time spent: 35 min  Erick Blinks, MD 03/19/2022, 7:51 PM

## 2022-03-20 DIAGNOSIS — K760 Fatty (change of) liver, not elsewhere classified: Secondary | ICD-10-CM | POA: Diagnosis not present

## 2022-03-20 DIAGNOSIS — A419 Sepsis, unspecified organism: Secondary | ICD-10-CM | POA: Diagnosis not present

## 2022-03-20 DIAGNOSIS — I5042 Chronic combined systolic (congestive) and diastolic (congestive) heart failure: Secondary | ICD-10-CM | POA: Diagnosis not present

## 2022-03-20 DIAGNOSIS — L03115 Cellulitis of right lower limb: Secondary | ICD-10-CM | POA: Diagnosis not present

## 2022-03-20 LAB — CREATININE, SERUM
Creatinine, Ser: 0.93 mg/dL (ref 0.61–1.24)
GFR, Estimated: >60 mL/min (ref >60)

## 2022-03-20 LAB — GLUCOSE, CAPILLARY
Glucose-Capillary: 129 mg/dL — ABNORMAL HIGH (ref 70–99)
Glucose-Capillary: 128 mg/dL — ABNORMAL HIGH (ref 70–99)

## 2022-03-20 MED ORDER — TRAMADOL HCL 50 MG PO TABS: 50.0000 mg | ORAL_TABLET | Freq: Four times a day (QID) | ORAL | 0 refills | Status: DC | PRN

## 2022-03-20 MED ORDER — TERBINAFINE HCL 250 MG PO TABS: 250.0000 mg | ORAL_TABLET | Freq: Every day | ORAL | 2 refills | Status: DC

## 2022-03-20 MED ORDER — CEFADROXIL 500 MG PO CAPS: 1000.0000 mg | ORAL_CAPSULE | Freq: Two times a day (BID) | ORAL | 0 refills | Status: AC

## 2022-03-20 NOTE — Progress Notes (Signed)
Provided and discussed discharge instructions. Addressed all questions and concerns. IV removed intact.  ?Donell Tomkins N Jaydan Meidinger ? ?

## 2022-03-20 NOTE — Discharge Summary (Addendum)
Physician Discharge Summary  Jason Melendez [id number] DOB: 27-Apr-1956 DOA: 03/13/2022  PCP: Pleas Koch, NP  Admit date: 03/13/2022 Discharge date: 03/20/2022  Admitted From: Home Disposition: Home  Recommendations for Outpatient Follow-up:  Follow up with PCP in 1-2 weeks Please obtain BMP/CBC in one week Recommend periodically following his LFTs since he is on oral terbinafine Follow-up with infectious disease as needed  Home Health: Equipment/Devices:  Discharge Condition: Stable CODE STATUS: Full code Diet recommendation: Heart healthy  Brief/Interim Summary: 66 year old male with history of type 2 diabetes, hypertension, hyperlipidemia, cardiopulmonary arrest and obesity, nonhemorrhagic CVA was sent to ER from urgent care due to hypotension.  He had fever at home of 103.  He was dizzy and was presyncope episode.  His vision turned blurry and grayish. He has chronic right lower extremity edema.  He is sent erythema to his right lower extremity and pain.  He denies any trauma.  He had never had cellulitis before.  Lactic acid was 4.4 on admission white count 10.7. Chest x-ray no acute cardiopulmonary disease on portable chest radiograph.     CT head without contrast with no acute intracranial normality.   CT renal study with stranding in the right groin with a large right groin lymph nodes, nonspecific but may be due to cellulitis.  There is bilateral perinephric stranding.  There was bilateral nonobstructing nephrolithiasis.  There is marked hepatic asteatosis 6.  There was aortic atherosclerosis.  No DVT was seen preliminary on Doppler ultrasound  Discharge Diagnoses:  Principal Problem:   Sepsis due to undetermined organism [facility]) Active Problems:   HLD (hyperlipidemia)   Type 2 diabetes mellitus (HCC)   Chronic combined systolic and diastolic heart failure (HCC)   Tinea pedis of right foot   Cellulitis of right lower extremity   Hepatic steatosis    Aortic atherosclerosis (HCC)   Urolithiasis   Sepsis ([place])  Septic encephalopathy  #1  Severe sepsis present on admission secondary to right lower extremity cellulitis-still with increasing edema and erythema to the right lower extremity right calf. Patient presented with hypotension lactic acidosis and leukocytosis with endorgan damage such as AKI.  This is improved with IV fluids and antibiotics. Increasing erythema and edema noted in the right lower extremity.   [name] negative Doppler no dvt Ct scan -skin thickening soft tissue edema no fluid collection or abscess. Treated with intravenous Ancef ID following Subsequently transition to oral cefadroxil Overall cellulitis has improved Recommended to continue to keep his lower extremity elevated Primary care physician   #2 type 2 diabetes on Farxiga and metformin prior to admission Resume on discharge   #3hyperlipedemia-on statin   # 4 chf stable-continue home medications including Entresto, Aldactone, Farxiga.     #5 depression on Lexapro   #6 AKI present on admit  Resolved with IV fluids  #7 onychomycosis of toes -Per infectious disease, started on terbinafine for 3 months Recommend periodically checking LFTs  Discharge Instructions  Discharge Instructions     Diet - low sodium heart healthy   Complete by: As directed    Increase activity slowly   Complete by: As directed       Allergies as of 03/20/2022       Reactions   Hydralazine Anaphylaxis, Other (See Comments)   THE PATIENT CODED BECAUSE OF A SUDDEN DROP IN B/P!!!! HAD TO RECEIVE CPR!!        Medication List     TAKE these medications    Accu-Chek FastClix  Lancets Misc USE TO CHECK BLOOD SUGARS ONCE DAILY AS DIRECTED   Accu-Chek Guide test strip Generic drug: glucose blood USE TO CHECK BLOOD SUGARS ONCE DAILY AS DIRECTED   acetaminophen 650 MG CR tablet Commonly known as: TYLENOL Take 650 mg by mouth 2 (two) times daily.   aspirin 325  MG tablet Take 1 tablet (325 mg total) by mouth daily.   atorvastatin 40 MG tablet Commonly known as: LIPITOR Take 1 tablet (40 mg total) by mouth daily. For cholesterol. What changed: when to take this   carvedilol 12.5 MG tablet Commonly known as: COREG Take 1 tablet (12.5 mg total) by mouth in the morning and at bedtime. Please keep upcoming appointment in June 2023 for future refills. Thank you What changed: additional instructions   cefadroxil 500 MG capsule Commonly known as: DURICEF Take 2 capsules (1,000 mg total) by mouth 2 (two) times daily for 10 days.   dapagliflozin propanediol 10 MG Tabs tablet Commonly known as: Farxiga Take 1 tablet (10 mg total) by mouth daily before breakfast.   Entresto 49-51 MG Generic drug: sacubitril-valsartan TAKE 1 TABLET 2 TIMES DAILY. What changed:  how much to take how to take this when to take this additional instructions   escitalopram 10 MG tablet Commonly known as: LEXAPRO TAKE 1 TABLET (10 MG TOTAL) BY MOUTH DAILY. FOR ANXIETY. What changed: when to take this   furosemide 20 MG tablet Commonly known as: LASIX TAKE 1 TABLET BY MOUTH EVERY DAY AS NEEDED FOR FLUID/EDEMA (WEIGHT GAIN 3+ LBS OVERNIGHT)   metFORMIN 1000 MG tablet Commonly known as: GLUCOPHAGE Take 1 tablet (1,000 mg total) by mouth 2 (two) times daily with a meal. For diabetes.   Osteo Bi-Flex Adv Joint Shield Tabs Take 1 tablet by mouth 2 (two) times daily.   spironolactone 25 MG tablet Commonly known as: ALDACTONE Take 1 tablet (25 mg total) by mouth daily.   Systane Ultra PF 0.4-0.3 % Soln Generic drug: Polyethyl Glyc-Propyl Glyc PF Place 1 drop into both eyes 2 (two) times daily as needed (for irritation).   terbinafine 250 MG tablet Commonly known as: LAMISIL Take 1 tablet (250 mg total) by mouth daily. Start taking on: March 21, 2022   traMADol 50 MG tablet Commonly known as: Ultram Take 1 tablet (50 mg total) by mouth every 6 (six) hours as  needed.               Durable Medical Equipment  (From admission, onward)           Start     Ordered   03/19/22 1351  For home use only DME Walker rolling  Once       Question Answer Comment  Walker: With [place]   Patient needs a walker to treat with the following condition Right leg pain      03/19/22 1351            Follow-up Information     Tommy Medal, Lavell Islam, MD Follow up.   Specialty: Infectious Diseases Why: follow up as needed Contact information: [address] [address] [phone]         Pleas Koch, NP Follow up.   Specialty: Internal Medicine Why: follow up in 1-2 weeks to follow up on cellulitis follow liver enzymes while on antifungal medication Contact information: [place]  [phone]  Allergies  Allergen Reactions   Hydralazine Anaphylaxis and Other (See Comments)    THE PATIENT CODED BECAUSE OF A SUDDEN DROP IN B/P!!!! HAD TO RECEIVE CPR!!    Consultations: Infectious disease   Procedures/Studies: CT EXTREMITY LOWER RIGHT W CONTRAST  Result Date: 03/16/2022 CLINICAL DATA:  Soft tissue infection suspected. Redness and pain from right knee down to the ankle. EXAM: CT OF THE LOWER RIGHT EXTREMITY WITH CONTRAST TECHNIQUE: Multidetector CT imaging of the lower right extremity was performed according to the standard protocol following intravenous contrast administration. RADIATION DOSE REDUCTION: This exam was performed according to the departmental dose-optimization program which includes automated exposure control, adjustment of the mA and/or kV according to patient size and/or use of iterative reconstruction technique. CONTRAST:  80mL OMNIPAQUE IOHEXOL 300 MG/ML  SOLN COMPARISON:  None Available. FINDINGS: Bones/Joint/Cartilage No evidence of fracture or osteonecrosis. Degenerative changes of the knee joint. No cortical erosion or periosteal  reaction. Ligaments Suboptimally assessed by CT. Muscles and Tendons Muscles and tendons are intact. No intramuscular hematoma or fluid collection. Soft tissues Subcutaneous soft tissue edema and mild skin thickening prominent about the distal calf, ankle and dorsum of the foot. No drainable fluid collection or abscess. IMPRESSION: 1. Mild skin thickening, subcutaneous soft tissue edema in the distal leg, ankle and foot suggesting cellulitis. No fluid collection or abscess. 2. Muscles and tendons are intact. No intramuscular fluid collection. 3. No acute osseous abnormality. Degenerate changes of the knee joint. Electronically Signed   By: Keane Police D.O.   On: 03/16/2022 18:28   VAS [place] LOWER EXTREMITY VENOUS (DVT)  Result Date: 03/16/2022  Lower Venous DVT Study Patient Name:  SAMEUL SCHRICK  Date of Exam:   03/16/2022 Medical Rec #: [id number]              Accession #:    [id number] Date of Birth: [birth year]             Patient Gender: M Patient Age:   14 years Exam Location:  [facility] Procedure:      VAS [place] LOWER EXTREMITY VENOUS (DVT) Referring Phys: Benjamine Mola MATHEWS --------------------------------------------------------------------------------  Indications: Edema.  Limitations: Poor ultrasound/tissue interface. Comparison Study: 03/13/2022 - RIGHT:                   - There is no evidence of deep vein thrombosis in the lower                   extremity.                    - No cystic structure found in the popliteal fossa.                   - Ultrasound characteristics of enlarged lymph nodes are noted                   in the                   groin.                    LEFT:                   - No evidence of common femoral vein obstruction. Performing Technologist: Oliver Hum RVT  Examination Guidelines: A complete evaluation includes B-mode imaging, spectral Doppler, color Doppler, and power Doppler as needed of all accessible portions of each vessel.  Bilateral testing is  considered an integral part of a complete examination. Limited examinations for reoccurring indications may be performed as noted. The reflux portion of the exam is performed with the patient in reverse Trendelenburg.  +---------+---------------+---------+-----------+----------+--------------+ RIGHT    CompressibilityPhasicitySpontaneityPropertiesThrombus Aging +---------+---------------+---------+-----------+----------+--------------+ CFV      Full           Yes      Yes                                 +---------+---------------+---------+-----------+----------+--------------+ SFJ      Full                                                        +---------+---------------+---------+-----------+----------+--------------+ FV Prox  Full                                                        +---------+---------------+---------+-----------+----------+--------------+ FV Mid   Full                                                        +---------+---------------+---------+-----------+----------+--------------+ FV DistalFull                                                        +---------+---------------+---------+-----------+----------+--------------+ PFV      Full                                                        +---------+---------------+---------+-----------+----------+--------------+ POP      Full           Yes      Yes                                 +---------+---------------+---------+-----------+----------+--------------+ PTV      Full                                                        +---------+---------------+---------+-----------+----------+--------------+ PERO     Full                                                        +---------+---------------+---------+-----------+----------+--------------+   +----+---------------+---------+-----------+----------+--------------+  LEFTCompressibilityPhasicitySpontaneityPropertiesThrombus Aging +----+---------------+---------+-----------+----------+--------------+ CFV Full  Yes      Yes                                 +----+---------------+---------+-----------+----------+--------------+     Summary: RIGHT: - There is no evidence of deep vein thrombosis in the lower extremity. However, portions of this examination were limited- see technologist comments above.  - No cystic structure found in the popliteal fossa.  LEFT: - No evidence of common femoral vein obstruction.  *See table(s) above for measurements and observations. Electronically signed by Servando Snare MD on 03/16/2022 at 4:24:28 PM.    Final    VAS [place] LOWER EXTREMITY VENOUS (DVT) (ONLY [facility] & [facility])  Result Date: 03/14/2022  Lower Venous DVT Study Patient Name:  CURTISS TRONE  Date of Exam:   03/13/2022 Medical Rec #: [id number]              Accession #:    [id number] Date of Birth: 1955-09-16             Patient Gender: M Patient Age:   62 years Exam [facility]  [facility] Procedure:      VAS [place] LOWER EXTREMITY VENOUS (DVT) Referring Phys: Ezequiel Essex --------------------------------------------------------------------------------  Indications: Edema, and Erythema.  Limitations: Body habitus and poor ultrasound/tissue interface. Comparison Study: No prior study Performing Technologist: Maudry Mayhew MHA, RDMS, RVT, RDCS  Examination Guidelines: A complete evaluation includes B-mode imaging, spectral Doppler, color Doppler, and power Doppler as needed of all accessible portions of each vessel. Bilateral testing is considered an integral part of a complete examination. Limited examinations for reoccurring indications may be performed as noted. The reflux portion of the exam is performed with the patient in reverse Trendelenburg.  +---------+---------------+---------+-----------+----------+--------------+ RIGHT     CompressibilityPhasicitySpontaneityPropertiesThrombus Aging +---------+---------------+---------+-----------+----------+--------------+ CFV      Full           Yes      Yes                                 +---------+---------------+---------+-----------+----------+--------------+ SFJ      Full                                                        +---------+---------------+---------+-----------+----------+--------------+ FV Prox  Full                                                        +---------+---------------+---------+-----------+----------+--------------+ FV Mid   Full                                                        +---------+---------------+---------+-----------+----------+--------------+ FV DistalFull                                                        +---------+---------------+---------+-----------+----------+--------------+  PFV      Full                                                        +---------+---------------+---------+-----------+----------+--------------+ POP      Full           Yes      Yes                                 +---------+---------------+---------+-----------+----------+--------------+ PTV      Full                                                        +---------+---------------+---------+-----------+----------+--------------+ PERO     Full                                                        +---------+---------------+---------+-----------+----------+--------------+   +----+---------------+---------+-----------+----------+--------------+ LEFTCompressibilityPhasicitySpontaneityPropertiesThrombus Aging +----+---------------+---------+-----------+----------+--------------+ CFV Full           Yes      Yes                                 +----+---------------+---------+-----------+----------+--------------+    Summary: RIGHT: - There is no evidence of deep vein thrombosis in the lower  extremity.  - No cystic structure found in the popliteal fossa. - Ultrasound characteristics of enlarged lymph nodes are noted in the groin.  LEFT: - No evidence of common femoral vein obstruction.  *See table(s) above for measurements and observations. Electronically signed by Harold Barban MD on 03/14/2022 at 12:48:51 AM.    Final    [place] RT LOWER EXTREM LTD SOFT TISSUE NON VASCULAR  Result Date: 03/13/2022 CLINICAL DATA:  Right inguinal lymphadenopathy EXAM: ULTRASOUND RIGHT LOWER EXTREMITY LIMITED TECHNIQUE: Ultrasound examination of the lower extremity soft tissues was performed in the area of clinical concern. COMPARISON:  None Available. FINDINGS: Targeted ultrasound was performed of the soft tissues of the right groin. There are two prominent lymph nodes are identified in the right inguinal region, largest measuring 3.5 x 0.9 x 1.9 cm. Preserved echogenic fatty hila within each node. Mildly increased color Doppler within the nodes. No surrounding soft tissue edema or fluid. IMPRESSION: Two mildly enlarged right inguinal lymph nodes, nonspecific and may be reactive. Clinical follow-up is recommended to ensure resolution. Electronically Signed   By: Davina Poke D.O.   On: 03/13/2022 14:14   CT Renal Stone Study  Result Date: 03/13/2022 CLINICAL DATA:  A 66 year old male presents for evaluation of flank pain, suspected kidney stones. EXAM: CT ABDOMEN AND PELVIS WITHOUT CONTRAST TECHNIQUE: Multidetector CT imaging of the abdomen and pelvis was performed following the standard protocol without IV contrast. RADIATION DOSE REDUCTION: This exam was performed according to the departmental dose-optimization program which includes automated exposure control, adjustment of the mA and/or kV according to patient size and/or use of iterative reconstruction technique. COMPARISON:  August of 2020, chest CT. No recent relevant priors for comparison. FINDINGS: Lower chest: Incidental imaging of the lung bases with  mild basilar atelectasis. Hepatobiliary: Marked hepatic steatosis. Smooth hepatic contours. No pericholecystic stranding. No signs of biliary duct dilation. Pancreas: Pancreatic contours are smooth. No signs of pancreatic inflammation. Spleen: Normal size and contour. Adrenals/Urinary Tract: Adrenal glands are normal. Smooth renal contours without hydronephrosis. Mild perinephric stranding is noted bilaterally perhaps slightly worse on the LEFT than the RIGHT. Nephrolithiasis in the bilateral kidneys, 4 mm on the LEFT in the interpolar LEFT kidney. 2 mm on the RIGHT in the lower pole of the RIGHT kidney. No signs of ureteral calculus. No substantial perivesical stranding. No hydronephrosis. Stomach/Bowel: No stranding adjacent to the stomach or small bowel. No sign of small bowel obstruction. The appendix is normal. No pericolonic stranding or signs of colonic inflammation or obstruction. Vascular/Lymphatic: Aortic atherosclerosis. No sign of aneurysm. Smooth contour of the IVC. There is no gastrohepatic or hepatoduodenal ligament lymphadenopathy. No retroperitoneal or mesenteric lymphadenopathy. No pelvic sidewall lymphadenopathy. Atherosclerotic changes are mild in terms of calcified changes. Study limited with respect to vascular assessment due to lack of intravenous contrast. RIGHT groin adenopathy and mild stranding in the RIGHT groin (image 99/2) 16 mm lymph node. Mild stranding in the adjacent fat in this area. Reproductive: Unremarkable by CT. Other: No ascites.  No pneumoperitoneum. Musculoskeletal: No acute bone finding. No destructive bone process. Spinal degenerative changes. IMPRESSION: 1. Stranding in the RIGHT groin with enlarged RIGHT groin lymph nodes, nonspecific but would correlate with any signs of cellulitis and side and site of symptoms. In the absence of obvious inflammation or explanation for reactive lymph nodes would suggest focused ultrasound evaluation and correlation with any history of  malignancy as enlargement could be seen in the setting of neoplasm. If there are signs of inflammation in this area clinically, short interval follow-up ultrasound is suggested to ensure resolution. 2. Bilateral perinephric stranding is noted bilaterally perhaps slightly worse on the LEFT than the RIGHT. This finding can be seen in the setting of urinary tract infection but could also be chronic. Correlation with urinalysis is recommended. 3. Bilateral nonobstructing nephrolithiasis, no ureteral calculi. 4. Marked hepatic steatosis. 5. Aortic atherosclerosis. Aortic Atherosclerosis (ICD10-I70.0). Electronically Signed   By: Zetta Bills M.D.   On: 03/13/2022 13:06   CT Head Wo Contrast  Result Date: 03/13/2022 CLINICAL DATA:  Neuro deficit, acute, stroke suspected EXAM: CT HEAD WITHOUT CONTRAST TECHNIQUE: Contiguous axial images were obtained from the base of the skull through the vertex without intravenous contrast. RADIATION DOSE REDUCTION: This exam was performed according to the departmental dose-optimization program which includes automated exposure control, adjustment of the mA and/or kV according to patient size and/or use of iterative reconstruction technique. COMPARISON:  10/12/2016, 10/13/2016 FINDINGS: Brain: No evidence of acute infarction, hemorrhage, hydrocephalus, extra-axial collection or mass lesion/mass effect. Remote left ACA territory infarct again seen. Mild age related cerebral volume loss. Vascular: Atherosclerotic calcifications involving the large vessels of the skull base. No unexpected hyperdense vessel. Skull: Normal. Negative for fracture or focal lesion. Sinuses/Orbits: No acute finding. Other: None. IMPRESSION: 1. No acute intracranial abnormality. 2. Remote left ACA territory infarct. Electronically Signed   By: Davina Poke D.O.   On: 03/13/2022 12:50   DG Chest Port 1 View  Result Date: 03/13/2022 CLINICAL DATA:  Questionable sepsis. EXAM: PORTABLE CHEST 1 VIEW  COMPARISON:  March 30, 2018. FINDINGS: EKG leads project over the chest. Trachea midline. Cardiomediastinal contours  and hilar structures are normal. Lungs are clear. No pneumothorax. On limited assessment there is no acute skeletal process. IMPRESSION: No acute cardiopulmonary disease. Electronically Signed   By: Zetta Bills M.D.   On: 03/13/2022 11:21      Subjective: Pain in his right lower extremity is improving.  He is able to ambulate more.  Discharge Exam: Vitals:   03/19/22 2057 03/20/22 0617 03/20/22 1016 03/20/22 1440  BP: 135/66 136/82 121/65 134/65  Pulse: 74 66  66  Resp: 18 16  20   Temp: 98.6 F (37 C) 97.9 F (36.6 C)  98.5 F (36.9 C)  TempSrc: Oral Oral  Oral  SpO2: 93% 94%  97%  Weight:      Height:        General: Pt is alert, awake, not in acute distress Cardiovascular: RRR, S1/S2 +, no rubs, no gallops Respiratory: CTA bilaterally, no wheezing, no rhonchi Abdominal: Soft, NT, ND, bowel sounds + Extremities: no edema, no cyanosis    The results of significant diagnostics from this hospitalization (including imaging, microbiology, ancillary and laboratory) are listed below for reference.     Microbiology: Recent Results (from the past 240 hour(s))  Urine Culture     Status: None   Collection Time: 03/13/22 10:49 AM   Specimen: In/Out Cath Urine  Result Value Ref Range Status   Specimen Description   Final    IN/OUT CATH URINE Performed at [facility], [place] [address] [place], [place] [address]    Special Requests   Final    NONE Performed at [facility], [place] [address], [place], [place] [address]    Culture   Final    NO GROWTH Performed at [facility], [place] [address] [place], [place] [address]    Report Status 03/14/2022 FINAL  Final  Blood Culture (routine x 2)     Status: None   Collection Time: 03/13/22 11:04 AM   Specimen: BLOOD  Result Value Ref Range Status   Specimen Description    Final    BLOOD BLOOD LEFT HAND Performed at [place] [address] [place], [place] [address]    Special Requests   Final    BOTTLES DRAWN AEROBIC AND ANAEROBIC Blood Culture adequate volume Performed at [place] [address], [place], [place] [address]    Culture   Final    NO GROWTH 5 DAYS Performed at [facility], [place] [address], [place], [place] [address]    Report Status 03/18/2022 FINAL  Final  Blood Culture (routine x 2)     Status: None   Collection Time: 03/13/22 12:00 PM   Specimen: BLOOD  Result Value Ref Range Status   Specimen Description   Final    BLOOD RIGHT ANTECUBITAL Performed at [facility] [address], [place], [place] [address]    Special Requests   Final    BOTTLES DRAWN AEROBIC AND ANAEROBIC Blood Culture results may not be optimal due to an excessive volume of blood received in culture bottles Performed at [place] [address], [place],  [address]    Culture   Final    NO GROWTH 5 DAYS Performed at [facility], [place] [address] [place],  [address]    Report Status 03/18/2022 FINAL  Final  Resp Panel by RT-PCR (Flu A&B, Covid) Anterior Nasal Swab     Status: None   Collection  Time: 03/13/22  1:15 PM   Specimen: Anterior Nasal Swab  Result Value Ref Range Status   SARS Coronavirus 2 by RT PCR NEGATIVE NEGATIVE Final    Comment: (NOTE) SARS-CoV-2 target nucleic acids are NOT DETECTED.  The SARS-CoV-2 RNA is generally detectable in upper respiratory specimens during the acute phase of infection. The lowest concentration of SARS-CoV-2 viral copies this assay can detect is 138 copies/mL. A negative result does not preclude SARS-Cov-2 infection and should not be used as the sole basis for treatment or other patient management decisions. A negative result may occur with  improper specimen collection/handling, submission of  specimen other than nasopharyngeal swab, presence of viral mutation(s) within the areas targeted by this assay, and inadequate number of viral copies(<138 copies/mL). A negative result must be combined with clinical observations, patient history, and epidemiological information. The expected result is Negative.  Fact Sheet for Patients:  EntrepreneurPulse.com.au  Fact Sheet for Healthcare Providers:  IncredibleEmployment.be  This test is no t yet approved or cleared by the [place] FDA and  has been authorized for detection and/or diagnosis of SARS-CoV-2 by FDA under an Emergency Use Authorization (EUA). This EUA will remain  in effect (meaning this test can be used) for the duration of the COVID-19 declaration under Section 564(b)(1) of the Act, 21 U.S.C.section 360bbb-3(b)(1), unless the authorization is terminated  or revoked sooner.       Influenza A by PCR NEGATIVE NEGATIVE Final   Influenza B by PCR NEGATIVE NEGATIVE Final    Comment: (NOTE) The Xpert Xpress SARS-CoV-2/FLU/RSV plus assay is intended as an aid in the diagnosis of influenza from Nasopharyngeal swab specimens and should not be used as a sole basis for treatment. Nasal washings and aspirates are unacceptable for Xpert Xpress SARS-CoV-2/FLU/RSV testing.  Fact Sheet for Patients: EntrepreneurPulse.com.au  Fact Sheet for Healthcare Providers: IncredibleEmployment.be  This test is not yet approved or cleared by the [place] FDA and has been authorized for detection and/or diagnosis of SARS-CoV-2 by FDA under an Emergency Use Authorization (EUA). This EUA will remain in effect (meaning this test can be used) for the duration of the COVID-19 declaration under Section 564(b)(1) of the Act, 21 U.S.C. section 360bbb-3(b)(1), unless the authorization is terminated or revoked.  Performed at [facility], [place]  [address], [place], [place] [address]   [name] Next Gen by PCR, Nasal     Status: None   Collection Time: 03/14/22  2:00 PM   Specimen: Nasal Mucosa; Nasal Swab  Result Value Ref Range Status   [name] by PCR Next Gen NOT DETECTED NOT DETECTED Final    Comment: (NOTE) The GeneXpert [name] Assay (FDA approved for NASAL specimens only), is one component of a comprehensive [name] colonization surveillance program. It is not intended to diagnose [name] infection nor to guide or monitor treatment for [name] infections. Test performance is not FDA approved in patients less than 45 years old. Performed at [facility], [place] [address] [place], [place] [address]      Labs: BNP (last 3 results) No results for input(s): "BNP" in the last 8760 hours. Basic Metabolic Panel: Recent Labs  Lab 03/14/22 0439 03/16/22 0352 03/18/22 0448 03/19/22 0432 03/20/22 0413  NA 140 143 140 141  --   K 4.1 4.2 3.7 4.0  --   CL 108 112* 106 106  --   CO2 23 26 26 28   --   GLUCOSE 124* 136* 132* 122*  --  BUN 21 13 11 12   --   CREATININE 1.00 0.92 0.89 1.12 0.93  CALCIUM 8.8* 8.7* 9.1 8.6*  --    Liver Function Tests: Recent Labs  Lab 03/14/22 0439 03/18/22 0448  AST 18 17  ALT 13 12  ALKPHOS 24* 30*  BILITOT 0.9 0.8  PROT 6.2* 6.4*  ALBUMIN 3.1* 2.9*   No results for input(s): "LIPASE", "AMYLASE" in the last 168 hours. No results for input(s): "AMMONIA" in the last 168 hours. CBC: Recent Labs  Lab 03/14/22 0439 03/16/22 0352 03/18/22 0448 03/19/22 0432  WBC 7.2 4.7 9.1 6.8  HGB 11.9* 11.9* 11.5* 11.3*  HCT 37.5* 37.1* 35.5* 35.7*  MCV 98.4 98.7 97.5 98.1  PLT 117* 147* 171 187   Cardiac Enzymes: No results for input(s): "CKTOTAL", "CKMB", "CKMBINDEX", "TROPONINI" in the last 168 hours. BNP: Invalid input(s): "POCBNP" CBG: Recent Labs  Lab 03/19/22 1141 03/19/22 1701 03/19/22 2055 03/20/22 0725 03/20/22 1141  GLUCAP 115* 144* 138* 129* 128*   D-Dimer No results  for input(s): "DDIMER" in the last 72 hours. Hgb A1c No results for input(s): "HGBA1C" in the last 72 hours. Lipid Profile No results for input(s): "CHOL", "HDL", "LDLCALC", "TRIG", "CHOLHDL", "LDLDIRECT" in the last 72 hours. Thyroid function studies No results for input(s): "TSH", "T4TOTAL", "T3FREE", "THYROIDAB" in the last 72 hours.  Invalid input(s): "FREET3" Anemia work up No results for input(s): "VITAMINB12", "FOLATE", "FERRITIN", "TIBC", "IRON", "RETICCTPCT" in the last 72 hours. Urinalysis    Component Value Date/Time   COLORURINE YELLOW 03/13/2022 1049   APPEARANCEUR CLEAR 03/13/2022 1049   LABSPEC 1.032 (H) 03/13/2022 1049   PHURINE 5.0 03/13/2022 1049   GLUCOSEU >=500 (A) 03/13/2022 1049   HGBUR NEGATIVE 03/13/2022 1049   BILIRUBINUR NEGATIVE 03/13/2022 1049   BILIRUBINUR negative 07/21/2015 0942   KETONESUR NEGATIVE 03/13/2022 1049   PROTEINUR NEGATIVE 03/13/2022 1049   UROBILINOGEN 0.2 07/21/2015 0942   NITRITE NEGATIVE 03/13/2022 1049   LEUKOCYTESUR NEGATIVE 03/13/2022 1049   Sepsis Labs Recent Labs  Lab 03/14/22 0439 03/16/22 0352 03/18/22 0448 03/19/22 0432  WBC 7.2 4.7 9.1 6.8   Microbiology Recent Results (from the past 240 hour(s))  Urine Culture     Status: None   Collection Time: 03/13/22 10:49 AM   Specimen: In/Out Cath Urine  Result Value Ref Range Status   Specimen Description   Final    IN/OUT CATH URINE Performed at [facility], [place] [address] [place], [place] [address]    Special Requests   Final    NONE Performed at [facility], [place] [address], [place], [place] [address]    Culture   Final    NO GROWTH Performed at [facility], [place] [address], [place], [place] [address]    Report Status 03/14/2022 FINAL  Final  Blood Culture (routine x 2)     Status: None   Collection Time: 03/13/22 11:04 AM   Specimen: BLOOD  Result Value Ref Range Status   Specimen Description   Final    BLOOD  BLOOD LEFT HAND Performed at [place] [address], [place], [place] [address]    Special Requests   Final    BOTTLES DRAWN AEROBIC AND ANAEROBIC Blood Culture adequate volume Performed at [place] [address] [place], [place] [address]    Culture   Final    NO GROWTH 5 DAYS Performed at [facility], [place] [address] [place], [place] [address]  Report Status 03/18/2022 FINAL  Final  Blood Culture (routine x 2)     Status: None   Collection Time: 03/13/22 12:00 PM   Specimen: BLOOD  Result Value Ref Range Status   Specimen Description   Final    BLOOD RIGHT ANTECUBITAL Performed at [facility], [address] [place], [place] [address]    Special Requests   Final    BOTTLES DRAWN AEROBIC AND ANAEROBIC Blood Culture results may not be optimal due to an excessive volume of blood received in culture bottles Performed at [facility], [address], [place], [place] [address]    Culture   Final    NO GROWTH 5 DAYS Performed at [facility], [address] [place], [place] [address]    Report Status 03/18/2022 FINAL  Final  Resp Panel by RT-PCR (Flu A&B, Covid) Anterior Nasal Swab     Status: None   Collection Time: 03/13/22  1:15 PM   Specimen: Anterior Nasal Swab  Result Value Ref Range Status   SARS Coronavirus 2 by RT PCR NEGATIVE NEGATIVE Final    Comment: (NOTE) SARS-CoV-2 target nucleic acids are NOT DETECTED.  The SARS-CoV-2 RNA is generally detectable in upper respiratory specimens during the acute phase of infection. The lowest concentration of SARS-CoV-2 viral copies this assay can detect is 138 copies/mL. A negative result does not preclude SARS-Cov-2 infection and should not be used as the sole basis for treatment or other patient management decisions. A negative result may occur with  improper specimen collection/handling, submission of specimen other than  nasopharyngeal swab, presence of viral mutation(s) within the areas targeted by this assay, and inadequate number of viral copies(<138 copies/mL). A negative result must be combined with clinical observations, patient history, and epidemiological information. The expected result is Negative.  Fact Sheet for Patients:  BloggerCourse.com  Fact Sheet for Healthcare Providers:  SeriousBroker.it  This test is no t yet approved or cleared by the [place] FDA and  has been authorized for detection and/or diagnosis of SARS-CoV-2 by FDA under an Emergency Use Authorization (EUA). This EUA will remain  in effect (meaning this test can be used) for the duration of the COVID-19 declaration under Section 564(b)(1) of the Act, 21 U.S.C.section 360bbb-3(b)(1), unless the authorization is terminated  or revoked sooner.       Influenza A by PCR NEGATIVE NEGATIVE Final   Influenza B by PCR NEGATIVE NEGATIVE Final    Comment: (NOTE) The Xpert Xpress SARS-CoV-2/FLU/RSV plus assay is intended as an aid in the diagnosis of influenza from Nasopharyngeal swab specimens and should not be used as a sole basis for treatment. Nasal washings and aspirates are unacceptable for Xpert Xpress SARS-CoV-2/FLU/RSV testing.  Fact Sheet for Patients: BloggerCourse.com  Fact Sheet for Healthcare Providers: SeriousBroker.it  This test is not yet approved or cleared by the [place] FDA and has been authorized for detection and/or diagnosis of SARS-CoV-2 by FDA under an Emergency Use Authorization (EUA). This EUA will remain in effect (meaning this test can be used) for the duration of the COVID-19 declaration under Section 564(b)(1) of the Act, 21 U.S.C. section 360bbb-3(b)(1), unless the authorization is terminated or revoked.  Performed at [facility], [address] [place], [place] [address]   [name] Next Gen by PCR, Nasal     Status: None   Collection Time: 03/14/22  2:00 PM   Specimen: Nasal Mucosa; Nasal Swab  Result Value Ref Range  Status   [name] by PCR Next Gen NOT DETECTED NOT DETECTED Final    Comment: (NOTE) The GeneXpert [name] Assay (FDA approved for NASAL specimens only), is one component of a comprehensive [name] colonization surveillance program. It is not intended to diagnose [name] infection nor to guide or monitor treatment for [name] infections. Test performance is not FDA approved in patients less than 50 years old. Performed at [facility], [address], [place], [place] [address]      Time coordinating discharge:  SIGNED:   Erick Blinks, MD  [facility] 03/20/2022, 7:04 PM   If 7PM-7AM, please contact night-coverage [email or web address]

## 2022-03-20 NOTE — Plan of Care (Signed)
  Problem: Education: Goal: Knowledge of General Education information will improve Description Including pain rating scale, medication(s)/side effects and non-pharmacologic comfort measures Outcome: Progressing   Problem: Health Behavior/Discharge Planning: Goal: Ability to manage health-related needs will improve Outcome: Progressing   

## 2022-03-20 NOTE — Care Management Important Message (Signed)
Important Message  Patient Details IM Letter given to the Patient Name: Jason Melendez MRN: [id number] Date of Birth: February 20, 1956   Medicare Important Message Given:  Yes     Caren Macadam 03/20/2022, 9:44 AM

## 2022-03-24 ENCOUNTER — Other Ambulatory Visit (HOSPITAL_BASED_OUTPATIENT_CLINIC_OR_DEPARTMENT_OTHER): Payer: Self-pay

## 2022-03-24 MED ORDER — DAPAGLIFLOZIN PROPANEDIOL 10 MG PO TABS: 10.0000 mg | ORAL_TABLET | Freq: Every day | ORAL | 3 refills | Status: DC

## 2022-03-24 NOTE — Telephone Encounter (Signed)
Received fax from [facility] requesting New Rx for Farxiga.  Pt of Dr. Katrinka Blazing. Routing to [facility] office for review.

## 2022-03-25 ENCOUNTER — Telehealth: Payer: Self-pay | Admitting: Primary Care

## 2022-03-25 ENCOUNTER — Encounter: Payer: Self-pay | Admitting: Interventional Cardiology

## 2022-03-25 DIAGNOSIS — E782 Mixed hyperlipidemia: Secondary | ICD-10-CM

## 2022-03-25 NOTE — Telephone Encounter (Signed)
Left a voicemail for patient to call the office back. Need to relay message to patient and schedule appointment. Patient will need a hospital follow-up per Mayra Reel NP.

## 2022-03-25 NOTE — Telephone Encounter (Signed)
Patient went to the hospital on 03/13/22 and was discharged on 03/20/22. They said he had a blood infection due to celulitis that went to sepsis and they got him over it and he will be able to return back to work next week. They have on some medication and want him to have labs to check up on his liver next Friday. Does he need to have just a lab or a hospital follow up? Callback is [phone]

## 2022-03-26 ENCOUNTER — Telehealth: Payer: Self-pay | Admitting: Interventional Cardiology

## 2022-03-26 NOTE — Telephone Encounter (Signed)
Spoke w/ pt made him an appt to come in on 04/07/22 3pm

## 2022-03-26 NOTE — Telephone Encounter (Signed)
Returned call to patient.  Patient states he was in the hospital 7/14 to 7/21 for sepsis/cellulitis of right leg. He reports he experienced frequent urination with urgency about a week prior to going to hospital. Patient states he has read a list of side effects from [place] and was alarmed by how many were listed for the same things he was treated for while in the hospital (frequent urination, UTI, bacterial skin infections).   Patient states he just picked up refills of [name] and has not opened them yet. He would like to know if he should continue the [place].  Will forward to Dr. Katrinka Blazing to review and advise.

## 2022-03-26 NOTE — Telephone Encounter (Signed)
Pt c/o medication issue:  1. Name of Medication: dapagliflozin propanediol (FARXIGA) 10 MG TABS tablet  2. How are you currently taking this medication (dosage and times per day)? As written  3. Are you having a reaction (difficulty breathing--STAT)? no  4. What is your medication issue? Pt states that he believes he was hospitalized because of this medication. He states he was at [facility] because [facility] had him in the waiting room for 5 1/2 hours. He states when he got to [facility], he had a fever and was uncontrollably urinating. He wants to know if there is anything else he can take

## 2022-03-27 NOTE — Addendum Note (Signed)
Addended by: Franchot Gallo on: 03/27/2022 01:47 PM   Modules accepted: Orders

## 2022-03-27 NOTE — Telephone Encounter (Signed)
Patient notified in MyChart message, per reply he is stopping Comoros. Dr. Katrinka Blazing made aware.

## 2022-04-01 MED ORDER — FUROSEMIDE 20 MG PO TABS: ORAL_TABLET | ORAL | 11 refills | Status: DC

## 2022-04-01 NOTE — Addendum Note (Signed)
Addended by: Franchot Gallo on: 04/01/2022 11:54 AM   Modules accepted: Orders

## 2022-04-07 ENCOUNTER — Ambulatory Visit (INDEPENDENT_AMBULATORY_CARE_PROVIDER_SITE_OTHER): Payer: HMO | Admitting: Primary Care

## 2022-04-07 ENCOUNTER — Encounter: Payer: Self-pay | Admitting: Primary Care

## 2022-04-07 DIAGNOSIS — B351 Tinea unguium: Secondary | ICD-10-CM

## 2022-04-07 DIAGNOSIS — L03115 Cellulitis of right lower limb: Secondary | ICD-10-CM | POA: Diagnosis not present

## 2022-04-07 DIAGNOSIS — E1165 Type 2 diabetes mellitus with hyperglycemia: Secondary | ICD-10-CM | POA: Diagnosis not present

## 2022-04-07 HISTORY — DX: Tinea unguium: B35.1

## 2022-04-07 NOTE — Assessment & Plan Note (Signed)
No longer on Farxiga due to significant side effects. Remain off for now, cardiology is aware.  Recommended he closely monitor glucose levels. Continue metformin 1000 mg BID.  Follow up in 3 months.

## 2022-04-07 NOTE — Patient Instructions (Addendum)
Stop by the lab prior to leaving today. I will notify you of your results once received.   Continue to monitor the redness of your leg.   Continue to monitor your toenails.  It was a pleasure to see you today!

## 2022-04-07 NOTE — Assessment & Plan Note (Signed)
Recent hospitalization. Labs, imaging, notes reviewed from hospital stay.  Cellulitis has significantly improved.  He will continue to closely monitor.   Repeat CBC and CMP pending today.

## 2022-04-07 NOTE — Assessment & Plan Note (Signed)
Bilateral toenails.  Continue terbinafine 250 mg daily. Checking LFT's today, if WNL then will continue 250 mg daily for another month. Max of 3 months.

## 2022-04-07 NOTE — Progress Notes (Signed)
Subjective:    Patient ID: Jason Melendez., male    DOB: 24-Feb-1956, 66 y.o.   MRN: [id number]  HPI  Jason Melendez. is a very pleasant 66 y.o. male with a history of hypertension, CHF, type 2 diabetes, hyperlipidemia, CVA, neurocardiogenic syncope, sepsis, right lower extremity cellulitis who presents today for hospital follow-up.  Originally evaluated on 03/13/2022 at [facility] urgent care for symptoms of diarrhea, nausea, weakness, incontinence/frequency/urgency, fatigue that began 1 day prior.  He was noted to be hypotensive, afebrile.  There was concern for urosepsis so he was instructed to proceed to the emergency department.   He presented to [facility] long ED that same day, endorsed fevers of 103, urinary symptoms, incontinence.  He also endorsed pain to his right lower extremity.  Code sepsis was called, he was treated with IV fluids and IV antibiotics.  He was admitted for cellulitis to right lower extremity with sepsis.  During his hospital stay he underwent CT head which was negative for acute abnormality.  He underwent CT renal study which showed stranding in the right groin, large right groin lymph node.  He underwent venous ultrasound which was negative for DVT.  Infectious disease consulted given worsening erythema to the right posterior calf, right foot, right thigh who adjusted antibiotics accordingly.  He was discharged home on 03/20/2022 with recommendations for oral cefadroxil for cellulitis treatment and terbinafine x 3 months for onychomycosis of toes. LFT monitoring was also recommended.   Since his discharge he completed his oral cefadroxil antibiotics. He is compliant to his terbinafine daily. He has 12 pills remaining. He has noticed slight improvement in his toenail fungus. He denies fevers, chills, fatigue, dizziness. His right lower extremity erythema has significantly improved.   He did not resume his [place] at discharge. Since he's been off he's noticed  resolve in his urinary symptoms, improved ROM to his knees. He is able to walk up and down stairs without difficulty. He has updated his cardiologist regarding him discontinuing [place]. He does not check glucose readings.   Review of Systems  Constitutional:  Negative for chills, fatigue and fever.  Respiratory:  Negative for shortness of breath.   Genitourinary:  Negative for dysuria, flank pain, frequency and urgency.  Skin:  Positive for color change.       Toenail fungus to bilateral toenails.          Past Medical History:  Diagnosis Date   Cardiopulmonary arrest (HCC)    Diabetes mellitus without complication (HCC)    History of CVA (cerebrovascular accident) 10/13/2016   HTN (hypertension)    Hyperlipidemia    Stroke [facility])     Social History   Socioeconomic History   Marital status: Married    Spouse name: Not on file   Number of children: Not on file   Years of education: Not on file   Highest education level: Not on file  Occupational History   Not on file  Tobacco Use   Smoking status: Never   Smokeless tobacco: Never  Vaping Use   Vaping Use: Never used  Substance and Sexual Activity   Alcohol use: No   Drug use: No   Sexual activity: Not on file  Other Topics Concern   Not on file  Social History Narrative   Marital status: married      Children: 2 daughters      Lives:  With wife      Employment: printing work  Tobacco: none      Alcohol: one beer every three months      Drugs:  None      Exercise:  No formal exercise; walks up three flights of stairs several times per day at work.   Lives at home.  Works for [employer].     2 yrs college.     Social Determinants of Health   Financial Resource Strain: Not on file  Food Insecurity: Not on file  Transportation Needs: Not on file  Physical Activity: Not on file  Stress: Not on file  Social Connections: Not on file  Intimate Partner Violence: Not on file    Past Surgical  History:  Procedure Laterality Date   ENDARTERECTOMY Left 10/16/2016   Procedure: ENDARTERECTOMY CAROTID;  Surgeon: Nada Libman, MD;  Location: [facility] OR;  Service: Vascular;  Laterality: Left;   HERNIA REPAIR     PATCH ANGIOPLASTY Left 10/16/2016   Procedure: PATCH ANGIOPLASTY USING Livia Snellen BIOLOGIC PATCH;  Surgeon: Nada Libman, MD;  Location: [facility] OR;  Service: Vascular;  Laterality: Left;    Family History  Problem Relation Age of Onset   Diabetes Mother    Heart disease Father 33       CABG   Diabetes Father    Kidney disease Father    Kidney failure Father    Diabetes Sister    Hypertension Sister     Allergies  Allergen Reactions   Hydralazine Anaphylaxis and Other (See Comments)    THE PATIENT CODED BECAUSE OF A SUDDEN DROP IN B/P!!!! HAD TO RECEIVE CPR!!    Current Outpatient Medications on File Prior to Visit  Medication Sig Dispense Refill   ACCU-CHEK FASTCLIX LANCETS MISC USE TO CHECK BLOOD SUGARS ONCE DAILY AS DIRECTED 102 each 5   ACCU-CHEK GUIDE test strip USE TO CHECK BLOOD SUGARS ONCE DAILY AS DIRECTED 100 strip 5   acetaminophen (TYLENOL) 650 MG CR tablet Take 650 mg by mouth 2 (two) times daily.     aspirin 325 MG tablet Take 1 tablet (325 mg total) by mouth daily. 60 tablet 0   atorvastatin (LIPITOR) 40 MG tablet Take 1 tablet (40 mg total) by mouth daily. For cholesterol. (Patient taking differently: Take 40 mg by mouth at bedtime. For cholesterol.) 90 tablet 3   carvedilol (COREG) 12.5 MG tablet Take 1 tablet (12.5 mg total) by mouth in the morning and at bedtime. Please keep upcoming appointment in June 2023 for future refills. Thank you (Patient taking differently: Take 12.5 mg by mouth in the morning and at bedtime.) 90 tablet 0   escitalopram (LEXAPRO) 10 MG tablet TAKE 1 TABLET (10 MG TOTAL) BY MOUTH DAILY. FOR ANXIETY. (Patient taking differently: Take 10 mg by mouth every evening. For anxiety.) 90 tablet 1   furosemide (LASIX) 20 MG tablet TAKE 1  TABLET BY MOUTH EVERY DAY AS NEEDED FOR FLUID/EDEMA (WEIGHT GAIN 3+ LBS OVERNIGHT) 30 tablet 11   metFORMIN (GLUCOPHAGE) 1000 MG tablet Take 1 tablet (1,000 mg total) by mouth 2 (two) times daily with a meal. For diabetes. 180 tablet 1   Misc Natural Products (OSTEO BI-FLEX ADV JOINT SHIELD) TABS Take 1 tablet by mouth 2 (two) times daily.     sacubitril-valsartan (ENTRESTO) 49-51 MG TAKE 1 TABLET 2 TIMES DAILY. (Patient taking differently: Take 1 tablet by mouth 2 (two) times daily.) 180 tablet 3   spironolactone (ALDACTONE) 25 MG tablet Take 1 tablet (25 mg total) by mouth daily. 30  tablet 9   SYSTANE ULTRA PF 0.4-0.3 % SOLN Place 1 drop into both eyes 2 (two) times daily as needed (for irritation).     terbinafine (LAMISIL) 250 MG tablet Take 1 tablet (250 mg total) by mouth daily. 30 tablet 2   traMADol (ULTRAM) 50 MG tablet Take 1 tablet (50 mg total) by mouth every 6 (six) hours as needed. 20 tablet 0   No current facility-administered medications on file prior to visit.    BP 132/76   Pulse 79   Temp 98.6 F (37 C) (Oral)   Ht 6\' 2"  (1.88 m)   Wt 260 lb (117.9 kg)   SpO2 97%   BMI 33.38 kg/m  Objective:   Physical Exam Cardiovascular:     Rate and Rhythm: Normal rate and regular rhythm.  Pulmonary:     Effort: Pulmonary effort is normal.     Breath sounds: Normal breath sounds. No wheezing or rales.  Musculoskeletal:     Cervical back: Neck supple.  Skin:    General: Skin is warm and dry.     Findings: Erythema present.     Comments: Mild patch of light erythema to right medial thigh, right lateral foot, and posterior/lateral calf. Mild peeling skin to right lower extremity.   Thickened toenails with yellow discoloration to toes 2-5 bilaterally.    Neurological:     Mental Status: He is alert and oriented to person, place, and time.           Assessment & Plan:   Problem List Items Addressed This Visit       Endocrine   Type 2 diabetes mellitus (HCC)    No  longer on Farxiga due to significant side effects. Remain off for now, cardiology is aware.  Recommended he closely monitor glucose levels. Continue metformin 1000 mg BID.  Follow up in 3 months.        Musculoskeletal and Integument   Onychomycosis    Bilateral toenails.  Continue terbinafine 250 mg daily. Checking LFT's today, if WNL then will continue 250 mg daily for another month. Max of 3 months.       Relevant Orders   Comprehensive metabolic panel     Other   Cellulitis of right lower extremity    Recent hospitalization. Labs, imaging, notes reviewed from hospital stay.  Cellulitis has significantly improved.  He will continue to closely monitor.   Repeat CBC and CMP pending today.       Relevant Orders   CBC       09-04-2002, NP

## 2022-04-08 ENCOUNTER — Other Ambulatory Visit: Payer: Self-pay | Admitting: Primary Care

## 2022-04-08 LAB — COMPREHENSIVE METABOLIC PANEL
ALT: 10 U/L (ref 0–53)
AST: 17 U/L (ref 0–37)
Albumin: 4.2 g/dL (ref 3.5–5.2)
Alkaline Phosphatase: 35 U/L — ABNORMAL LOW (ref 39–117)
BUN: 17 mg/dL (ref 6–23)
CO2: 27 mEq/L (ref 19–32)
Calcium: 9.7 mg/dL (ref 8.4–10.5)
Chloride: 105 mEq/L (ref 96–112)
Creatinine, Ser: 0.92 mg/dL (ref 0.40–1.50)
GFR: 87.21 mL/min (ref >60.00)
Glucose, Bld: 109 mg/dL — ABNORMAL HIGH (ref 70–99)
Potassium: 4.6 mEq/L (ref 3.5–5.1)
Sodium: 140 mEq/L (ref 135–145)
Total Bilirubin: 0.5 mg/dL (ref 0.2–1.2)
Total Protein: 7.3 g/dL (ref 6.0–8.3)

## 2022-04-08 LAB — CBC
HCT: 37.4 % — ABNORMAL LOW (ref 39.0–52.0)
Hemoglobin: 12.4 g/dL — ABNORMAL LOW (ref 13.0–17.0)
MCHC: 33.2 g/dL (ref 30.0–36.0)
MCV: 93.4 fl (ref 78.0–100.0)
Platelets: 174 10*3/uL (ref 150.0–400.0)
RBC: 4 Mil/uL — ABNORMAL LOW (ref 4.22–5.81)
RDW: 13.5 % (ref 11.5–15.5)
WBC: 5.1 10*3/uL (ref 4.0–10.5)

## 2022-04-14 ENCOUNTER — Encounter: Payer: Self-pay | Admitting: Primary Care

## 2022-05-14 ENCOUNTER — Other Ambulatory Visit: Payer: Self-pay | Admitting: Primary Care

## 2022-05-14 DIAGNOSIS — F411 Generalized anxiety disorder: Secondary | ICD-10-CM

## 2022-05-28 ENCOUNTER — Other Ambulatory Visit: Payer: Self-pay | Admitting: Interventional Cardiology

## 2022-05-28 DIAGNOSIS — E782 Mixed hyperlipidemia: Secondary | ICD-10-CM

## 2022-06-03 ENCOUNTER — Other Ambulatory Visit: Payer: Self-pay | Admitting: Primary Care

## 2022-06-03 DIAGNOSIS — E119 Type 2 diabetes mellitus without complications: Secondary | ICD-10-CM

## 2022-07-21 ENCOUNTER — Ambulatory Visit (INDEPENDENT_AMBULATORY_CARE_PROVIDER_SITE_OTHER): Payer: HMO | Admitting: Primary Care

## 2022-07-21 ENCOUNTER — Encounter: Payer: Self-pay | Admitting: Primary Care

## 2022-07-21 VITALS — BP 128/74 | HR 60 | Temp 97.5°F | Ht 74.0 in | Wt 265.0 lb

## 2022-07-21 DIAGNOSIS — I700 Atherosclerosis of aorta: Secondary | ICD-10-CM

## 2022-07-21 DIAGNOSIS — E1165 Type 2 diabetes mellitus with hyperglycemia: Secondary | ICD-10-CM | POA: Diagnosis not present

## 2022-07-21 DIAGNOSIS — I10 Essential (primary) hypertension: Secondary | ICD-10-CM

## 2022-07-21 DIAGNOSIS — Z125 Encounter for screening for malignant neoplasm of prostate: Secondary | ICD-10-CM | POA: Diagnosis not present

## 2022-07-21 DIAGNOSIS — Z23 Encounter for immunization: Secondary | ICD-10-CM

## 2022-07-21 DIAGNOSIS — F411 Generalized anxiety disorder: Secondary | ICD-10-CM

## 2022-07-21 DIAGNOSIS — I5042 Chronic combined systolic (congestive) and diastolic (congestive) heart failure: Secondary | ICD-10-CM

## 2022-07-21 DIAGNOSIS — E7849 Other hyperlipidemia: Secondary | ICD-10-CM

## 2022-07-21 DIAGNOSIS — Z0000 Encounter for general adult medical examination without abnormal findings: Secondary | ICD-10-CM | POA: Diagnosis not present

## 2022-07-21 LAB — LIPID PANEL
Cholesterol: 101 mg/dL (ref 0–200)
HDL: 26.4 mg/dL — ABNORMAL LOW (ref >39.00)
LDL Cholesterol: 39 mg/dL (ref 0–99)
NonHDL: 74.89
Total CHOL/HDL Ratio: 4
Triglycerides: 177 mg/dL — ABNORMAL HIGH (ref 0.0–149.0)
VLDL: 35.4 mg/dL (ref 0.0–40.0)

## 2022-07-21 LAB — MICROALBUMIN / CREATININE URINE RATIO
Creatinine,U: 73.6 mg/dL
Microalb Creat Ratio: 1 mg/g (ref 0.0–30.0)
Microalb, Ur: <0.7 mg/dL (ref 0.0–1.9)

## 2022-07-21 LAB — PSA, MEDICARE: PSA: 0.93 ng/ml (ref 0.10–4.00)

## 2022-07-21 LAB — HEMOGLOBIN A1C: Hgb A1c MFr Bld: 6.8 % — ABNORMAL HIGH (ref 4.6–6.5)

## 2022-07-21 NOTE — Assessment & Plan Note (Addendum)
Appears euvolemic today.  Continue carvedilol 12.5 mg twice daily, blood pressure control, Entresto 49-51 mg daily, spironolactone 25 mg daily, furosemide 20 mg as needed.

## 2022-07-21 NOTE — Progress Notes (Signed)
HPI: Jason Melendez. presents today for Medicare Wellness Visit and for follow up of chronic medical conditions.   Past Medical History:  Diagnosis Date   Cardiopulmonary arrest (HCC)    Cellulitis of right lower extremity 03/13/2022   Diabetes mellitus without complication (HCC)    History of CVA (cerebrovascular accident) 10/13/2016   HTN (hypertension)    Hyperlipidemia    Sepsis (HCC) 03/14/2022   Sepsis due to undetermined organism (HCC) 03/13/2022   Stroke (HCC)    Syncope 03/30/2018    Current Outpatient Medications  Medication Sig Dispense Refill   ACCU-CHEK FASTCLIX LANCETS MISC USE TO CHECK BLOOD SUGARS ONCE DAILY AS DIRECTED 102 each 5   ACCU-CHEK GUIDE test strip USE TO CHECK BLOOD SUGARS ONCE DAILY AS DIRECTED 100 strip 5   acetaminophen (TYLENOL) 650 MG CR tablet Take 650 mg by mouth 2 (two) times daily.     aspirin 325 MG tablet Take 1 tablet (325 mg total) by mouth daily. 60 tablet 0   atorvastatin (LIPITOR) 40 MG tablet Take 1 tablet (40 mg total) by mouth daily. For cholesterol. (Patient taking differently: Take 40 mg by mouth at bedtime. For cholesterol.) 90 tablet 3   carvedilol (COREG) 12.5 MG tablet Take 1 tablet (12.5 mg total) by mouth in the morning and at bedtime. 90 tablet 3   escitalopram (LEXAPRO) 10 MG tablet TAKE 1 TABLET (10 MG TOTAL) BY MOUTH DAILY. FOR ANXIETY. 90 tablet 0   furosemide (LASIX) 20 MG tablet TAKE 1 TABLET BY MOUTH EVERY DAY AS NEEDED FOR FLUID/EDEMA (WEIGHT GAIN 3+ LBS OVERNIGHT) 30 tablet 11   metFORMIN (GLUCOPHAGE) 1000 MG tablet TAKE 1 TABLET (1,000 MG TOTAL) BY MOUTH 2 (TWO) TIMES DAILY WITH A MEAL. FOR DIABETES. 180 tablet 0   Misc Natural Products (OSTEO BI-FLEX ADV JOINT SHIELD) TABS Take 1 tablet by mouth 2 (two) times daily.     sacubitril-valsartan (ENTRESTO) 49-51 MG TAKE 1 TABLET 2 TIMES DAILY. (Patient taking differently: Take 1 tablet by mouth 2 (two) times daily.) 180 tablet 3   spironolactone (ALDACTONE) 25 MG tablet  Take 1 tablet (25 mg total) by mouth daily. 30 tablet 9   SYSTANE ULTRA PF 0.4-0.3 % SOLN Place 1 drop into both eyes 2 (two) times daily as needed (for irritation).     No current facility-administered medications for this visit.    Allergies  Allergen Reactions   Hydralazine Anaphylaxis and Other (See Comments)    THE PATIENT CODED BECAUSE OF A SUDDEN DROP IN B/P!!!! HAD TO RECEIVE CPR!!   [name] [Dapagliflozin] Other (See Comments)    Family History  Problem Relation Age of Onset   Diabetes Mother    Heart disease Father 65       CABG   Diabetes Father    Kidney disease Father    Kidney failure Father    Diabetes Sister    Hypertension Sister     Social History   Socioeconomic History   Marital status: Married    Spouse name: Not on file   Number of children: Not on file   Years of education: Not on file   Highest education level: Not on file  Occupational History   Not on file  Tobacco Use   Smoking status: Never   Smokeless tobacco: Never  Vaping Use   Vaping Use: Never used  Substance and Sexual Activity   Alcohol use: No   Drug use: No   Sexual activity: Not on file  Other Topics Concern   Not on file  Social History Narrative   Marital status: married      Children: 2 daughters      Lives:  With wife      Employment: printing work      Tobacco: none      Alcohol: one beer every three months      Drugs:  None      Exercise:  No formal exercise; walks up three flights of stairs several times per day at work.   Lives at home.  Works for [employer].     2 yrs college.     Social Determinants of Health   Financial Resource Strain: Not on file  Food Insecurity: Not on file  Transportation Needs: Not on file  Physical Activity: Not on file  Stress: Not on file  Social Connections: Not on file  Intimate Partner Violence: Not on file    Hospitiliaztions: July 2023  Health Maintenance:    Vaccines:  Influenza: Due  today  Tetanus: 2016  Pneumovax: 2019  Prevnar: 2023  Zostavax/Shingrix: Completed Shingrix   Imaging/Procedures:   Colonoscopy: Completed Cologuard   Other:   Eye Doctor: UTD  Dental Exam: Completed years ago  PSA: Due    Providers: Dr. Katrinka Blazing, Cardiology; Vernona Rieger, PCP   I have personally reviewed and have noted: 1. The patient's medical and social history 2. Their use of alcohol, tobacco or illicit drugs 3. Their current medications and supplements 4. The patient's functional ability including ADL's, fall risks, home safety risks and   hearing or visual impairment. 5. Diet and physical activities 6. Evidence for depression or mood disorder  Subjective:   Review of Systems:   Constitutional: Denies fever, malaise, fatigue, headache or abrupt weight changes.  HEENT: Denies eye pain, eye redness, ear pain, ringing in the ears, runny nose, nasal congestion, bloody nose, or sore throat. Respiratory: Denies difficulty breathing, shortness of breath, cough with/without sputum production.   Cardiovascular: Denies chest pain, chest tightness, palpitations, or swelling in the hands or feet.  Gastrointestinal: Denies abdominal pain, bloating, constipation, diarrhea,  or blood in the stool.  GU: Denies urgency, frequency, dysuria, hematuria, odor or discharge. Musculoskeletal: Denies decrease in range of motion, difficulty with gait, muscle pain or joint pain and swelling.  Skin: Denies redness, rashes, lesions, or ulcercations.  Neurological: Denies dizziness, difficulty with memory, difficulty with speech, or problems with balance and coordination.  Psychiatric: Denies concerns for anxiety or depression.   No other specific complaints in a complete review of systems (except as listed in HPI above).  Objective:  PE:   BP 128/74   Pulse 60   Temp (!) 97.5 F (36.4 C) (Temporal)   Ht 6\' 2"  (1.88 m)   Wt 265 lb (120.2 kg)   SpO2 97%   BMI 34.02 kg/m  Wt Readings  from Last 3 Encounters:  07/21/22 265 lb (120.2 kg)  04/07/22 260 lb (117.9 kg)  03/13/22 264 lb (119.7 kg)    General: Appears their stated age, well developed, well nourished in NAD. Skin: Warm, dry and intact. No rashes, lesions or ulcerations noted. HEENT: Head: normal shape and size; Eyes: sclera white, no icterus, conjunctiva pink, PERRLA and EOMs intact; Ears: Tm's gray and intact, normal light reflex; Nose: mucosa pink and moist, septum midline; Throat/Mouth: Teeth present, mucosa pink and moist, no exudate, lesions or ulcerations noted.  Neck: Normal range of motion. Neck supple, trachea midline. No massses, lumps  or thyromegaly present.  Cardiovascular: Normal rate and rhythm. S1,S2 noted.  No murmur, rubs or gallops noted. No JVD or BLE edema. No carotid bruits noted. Pulmonary/Chest: Normal effort and positive vesicular breath sounds. No respiratory distress. No wheezes, rales or ronchi noted.  Abdomen: Soft and nontender. Normal bowel sounds, no bruits noted. No distention or masses noted. Liver, spleen and kidneys non palpable. Musculoskeletal: Normal range of motion. No signs of joint swelling. No difficulty with gait.  Neurological: Alert and oriented. Cranial nerves II-XII intact. Coordination normal. +DTRs bilaterally. Psychiatric: Mood and affect normal. Behavior is normal. Judgment and thought content normal.   EKG: Completed in July 2023, NSR with rate of 93. PVC noted. Appears similar to ECG from December 2022.  BMET    Component Value Date/Time   NA 140 04/07/2022 1559   NA 148 (H) 10/13/2021 0820   K 4.6 04/07/2022 1559   CL 105 04/07/2022 1559   CO2 27 04/07/2022 1559   GLUCOSE 109 (H) 04/07/2022 1559   BUN 17 04/07/2022 1559   BUN 16 10/13/2021 0820   CREATININE 0.92 04/07/2022 1559   CREATININE 1.10 07/18/2021 1512   CALCIUM 9.7 04/07/2022 1559   GFRNONAA >60 03/20/2022 0413   GFRAA 83 03/22/2020 0723    Lipid Panel     Component Value Date/Time    CHOL 119 07/18/2021 1512   CHOL 111 03/22/2020 0723   TRIG 258 (H) 07/18/2021 1512   HDL 25 (L) 07/18/2021 1512   HDL 24 (L) 03/22/2020 0723   CHOLHDL 4.8 07/18/2021 1512   VLDL 41.2 (H) 03/04/2018 0745   LDLCALC 64 07/18/2021 1512    CBC    Component Value Date/Time   WBC 5.1 04/07/2022 1559   RBC 4.00 (L) 04/07/2022 1559   HGB 12.4 (L) 04/07/2022 1559   HGB 12.7 (L) 03/22/2020 0723   HCT 37.4 (L) 04/07/2022 1559   HCT 37.8 03/22/2020 0723   PLT 174.0 04/07/2022 1559   PLT 150 03/22/2020 0723   MCV 93.4 04/07/2022 1559   MCV 94 03/22/2020 0723   MCH 31.0 03/19/2022 0432   MCHC 33.2 04/07/2022 1559   RDW 13.5 04/07/2022 1559   RDW 12.3 03/22/2020 0723   LYMPHSABS 1.2 03/13/2022 1104   MONOABS 0.2 03/13/2022 1104   EOSABS 0.0 03/13/2022 1104   BASOSABS 0.0 03/13/2022 1104    Hgb A1C Lab Results  Component Value Date   HGBA1C 6.2 (A) 01/15/2022      Assessment and Plan:   Medicare Annual Wellness Visit:  Diet: Working to improve his diet.  Physical activity: Active Depression/mood screen: Negative Hearing: Intact to whispered voice Visual acuity: Grossly normal, performs annual eye exam  ADLs: Capable Fall risk: None Home safety: Good Cognitive evaluation: Intact to orientation, naming, recall and repetition EOL planning: Adv directives, full code/ I agree  Preventative Medicine: Immunizations UTD. Influenza vaccine provided today. Colon cancer screening UTD. PSA due and pending.  All recommendations provided at end of visit.   Discussed the importance of a healthy diet and regular exercise in order for weight loss, and to reduce the risk of further co-morbidity.  Exam stable. Labs pending.  Follow up in 1 year for repeat physical.   Next appointment: 1 year or sooner if needed

## 2022-07-21 NOTE — Assessment & Plan Note (Signed)
Controlled.  Continue carvedilol 12.5 mg twice daily, spironolactone 25 mg daily. BMP reviewed from hospitalization in July 2023

## 2022-07-21 NOTE — Patient Instructions (Addendum)
Stop by the lab prior to leaving today. I will notify you of your results once received.   Please schedule a follow up visit for 6 months for a diabetes check.  It was a pleasure to see you today!   

## 2022-07-21 NOTE — Assessment & Plan Note (Signed)
Controlled.  Continue Lexapro 10 mg daily. 

## 2022-07-21 NOTE — Assessment & Plan Note (Signed)
Repeat lipid panel pending. Continue atorvastatin 40 mg daily. 

## 2022-07-21 NOTE — Assessment & Plan Note (Signed)
Repeat A1c pending.  Continue metformin 1000 mg twice daily.  Managed on statin. Urine microalbumin due and pending. Pneumonia vaccine up-to-date.  Follow-up in 6 months.

## 2022-07-21 NOTE — Assessment & Plan Note (Signed)
Immunizations UTD. Influenza vaccine provided today. Colon cancer screening UTD. PSA due and pending.  All recommendations provided at end of visit.   Discussed the importance of a healthy diet and regular exercise in order for weight loss, and to reduce the risk of further co-morbidity.  Exam stable. Labs pending.  Follow up in 1 year for repeat physical.  I have personally reviewed and have noted: 1. The patient's medical and social history 2. Their use of alcohol, tobacco or illicit drugs 3. Their current medications and supplements 4. The patient's functional ability including ADL's, fall risks, home safety risks and   hearing or visual impairment. 5. Diet and physical activities 6. Evidence for depression or mood disorder

## 2022-07-21 NOTE — Assessment & Plan Note (Signed)
Repeat lipid panel pending.  Continue atorvastatin 40 mg daily. LDL goal of less than 70

## 2022-07-22 ENCOUNTER — Encounter: Payer: Self-pay | Admitting: Interventional Cardiology

## 2022-07-22 DIAGNOSIS — E782 Mixed hyperlipidemia: Secondary | ICD-10-CM

## 2022-07-22 MED ORDER — ENTRESTO 49-51 MG PO TABS: 1.0000 | ORAL_TABLET | Freq: Two times a day (BID) | ORAL | 2 refills | Status: DC

## 2022-07-27 DIAGNOSIS — E1165 Type 2 diabetes mellitus with hyperglycemia: Secondary | ICD-10-CM

## 2022-07-28 MED ORDER — BLOOD GLUCOSE MONITOR KIT: PACK | 0 refills | Status: DC

## 2022-07-31 MED ORDER — GLUCOSE BLOOD VI STRP: ORAL_STRIP | 5 refills | Status: DC

## 2022-08-03 LAB — HM DIABETES EYE EXAM

## 2022-08-05 ENCOUNTER — Telehealth: Payer: Self-pay | Admitting: Interventional Cardiology

## 2022-08-05 NOTE — Telephone Encounter (Signed)
Patient states he received the later that Dr. Katrinka Blazing is retiriing. He would like to know what Dr., Dr.Smith recommend him to start seeing now. Please advise

## 2022-08-06 NOTE — Telephone Encounter (Signed)
Returned call to patient.  Noted patient is due for 6 month follow-up with Dr. Katrinka Blazing, offered appt with him on 09/04/22. Patient accepted 8:00am appt time, reminded patient to arrive 15 minutes before appt time to get checked-in and roomed. Patient verbalized understanding and expressed appreciation for call.

## 2022-08-11 ENCOUNTER — Other Ambulatory Visit: Payer: Self-pay | Admitting: Primary Care

## 2022-08-11 DIAGNOSIS — F411 Generalized anxiety disorder: Secondary | ICD-10-CM

## 2022-08-15 ENCOUNTER — Other Ambulatory Visit: Payer: Self-pay | Admitting: Primary Care

## 2022-08-15 ENCOUNTER — Other Ambulatory Visit: Payer: Self-pay | Admitting: Interventional Cardiology

## 2022-08-15 DIAGNOSIS — E7849 Other hyperlipidemia: Secondary | ICD-10-CM

## 2022-08-15 DIAGNOSIS — I5042 Chronic combined systolic (congestive) and diastolic (congestive) heart failure: Secondary | ICD-10-CM

## 2022-08-28 ENCOUNTER — Other Ambulatory Visit: Payer: Self-pay | Admitting: Primary Care

## 2022-08-28 DIAGNOSIS — E119 Type 2 diabetes mellitus without complications: Secondary | ICD-10-CM

## 2022-09-03 ENCOUNTER — Encounter: Payer: Self-pay | Admitting: Interventional Cardiology

## 2022-09-03 NOTE — Progress Notes (Signed)
Cardiology Office Note:    Date:  09/04/2022   ID:  Jason Center., DOB 09-10-1955, MRN [id number]  PCP:  Pleas Koch, NP  Cardiologist:  Sinclair Grooms, MD   Referring MD: Pleas Koch, NP   Chief Complaint  Patient presents with   Congestive Heart Failure    History of Present Illness:    Jason Bradt. is a 67 y.o. male with a hx of left brain stroke, high-grade left carotid stenosis treated with surgery by Dr. Trula Slade, asymptomatic systolic heart failure, hypertensive heart disease, hyperlipidemia,  recurrent episodes of syncope (vasovagal) and chronic combined systolic and diastolic heart failure on guideline directed medical therapy for LV preservation. Presumed etiology of CM is HTN - negative Myoview in February of 2018. He has not had cardiac cath.    He had an extreme infectious illness involving the right lower extremity and bacteremia in July.  Wilder Glade was stopped because the and perhaps the physicians had concern it had contributed.  He feels fine.  Denies orthopnea, PND, shortness of breath, syncope, and claudication.  Current heart failure therapy includes Entresto, Aldactone, carvedilol, and furosemide.  Past Medical History:  Diagnosis Date   Cardiopulmonary arrest ([place])    Cellulitis of right lower extremity 03/13/2022   Diabetes mellitus without complication (HCC)    History of CVA (cerebrovascular accident) 10/13/2016   HTN (hypertension)    Hyperlipidemia    Sepsis ([place]) 03/14/2022   Sepsis due to undetermined organism ([place]) 03/13/2022   Stroke ([place])    Syncope 03/30/2018    Past Surgical History:  Procedure Laterality Date   ENDARTERECTOMY Left 10/16/2016   Procedure: ENDARTERECTOMY CAROTID;  Surgeon: Serafina Mitchell, MD;  Location: [facility];  Service: Vascular;  Laterality: Left;   HERNIA REPAIR     PATCH ANGIOPLASTY Left 10/16/2016   Procedure: PATCH ANGIOPLASTY USING Rueben Bash BIOLOGIC PATCH;  Surgeon: Serafina Mitchell, MD;   Location: [facility];  Service: Vascular;  Laterality: Left;    Current Medications: Current Meds  Medication Sig   ACCU-CHEK FASTCLIX LANCETS MISC USE TO CHECK BLOOD SUGARS ONCE DAILY AS DIRECTED   acetaminophen (TYLENOL) 650 MG CR tablet Take 650 mg by mouth 2 (two) times daily.   aspirin 325 MG tablet Take 1 tablet (325 mg total) by mouth daily.   atorvastatin (LIPITOR) 40 MG tablet TAKE 1 TABLET BY MOUTH EVERY DAY FOR CHOLESTEROL   blood glucose meter kit and supplies KIT Dispense based on patient and insurance preference. Use up to four times daily as directed. (FOR ICD-9 250.00, 250.01).   carvedilol (COREG) 12.5 MG tablet Take 1 tablet (12.5 mg total) by mouth in the morning and at bedtime.   escitalopram (LEXAPRO) 10 MG tablet TAKE 1 TABLET (10 MG TOTAL) BY MOUTH DAILY. FOR ANXIETY.   furosemide (LASIX) 20 MG tablet TAKE 1 TABLET BY MOUTH EVERY DAY AS NEEDED FOR FLUID/EDEMA (WEIGHT GAIN 3+ LBS OVERNIGHT)   glucose blood test strip Use as instructed   metFORMIN (GLUCOPHAGE) 1000 MG tablet TAKE 1 TABLET (1,000 MG TOTAL) BY MOUTH 2 (TWO) TIMES DAILY WITH A MEAL. FOR DIABETES.   Misc Natural Products (OSTEO BI-FLEX ADV JOINT SHIELD) TABS Take 1 tablet by mouth 2 (two) times daily.   spironolactone (ALDACTONE) 25 MG tablet TAKE 1 TABLET (25 MG TOTAL) BY MOUTH DAILY.   SYSTANE ULTRA PF 0.4-0.3 % SOLN Place 1 drop into both eyes 2 (two) times daily as needed (for irritation).   [DISCONTINUED]  sacubitril-valsartan (ENTRESTO) 49-51 MG Take 1 tablet by mouth 2 (two) times daily.     Allergies:   Hydralazine and Farxiga [dapagliflozin]   Social History   Socioeconomic History   Marital status: Married    Spouse name: Not on file   Number of children: Not on file   Years of education: Not on file   Highest education level: Not on file  Occupational History   Not on file  Tobacco Use   Smoking status: Never   Smokeless tobacco: Never  Vaping Use   Vaping Use: Never used  Substance and  Sexual Activity   Alcohol use: No   Drug use: No   Sexual activity: Not on file  Other Topics Concern   Not on file  Social History Narrative   Marital status: married      Children: 2 daughters      Lives:  With wife      Employment: printing work      Tobacco: none      Alcohol: one beer every three months      Drugs:  None      Exercise:  No formal exercise; walks up three flights of stairs several times per day at work.   Lives at home.  Works for [employer].     2 yrs college.     Social Determinants of Health   Financial Resource Strain: Not on file  Food Insecurity: Not on file  Transportation Needs: Not on file  Physical Activity: Not on file  Stress: Not on file  Social Connections: Not on file     Family History: The patient's family history includes Diabetes in his father, mother, and sister; Heart disease (age of onset: 63) in his father; Hypertension in his sister; Kidney disease in his father; Kidney failure in his father.  ROS:   Please see the history of present illness.    Denies orthopnea.  No lower extremity edema.  All other systems reviewed and are negative.  EKGs/Labs/Other Studies Reviewed:    The following studies were reviewed today: 2 D Doppler ECHOCARDIOGRAPHY 09/08/2021 IMPRESSIONS     1. Left ventricular ejection fraction, by estimation, is 40 to 45%. The  left ventricle has mildly decreased function. The left ventricle  demonstrates global hypokinesis. There is mild concentric left ventricular  hypertrophy. Left ventricular diastolic  parameters are consistent with Grade I diastolic dysfunction (impaired  relaxation).   2. Right ventricular systolic function is normal. The right ventricular  size is normal.   3. Left atrial size was mildly dilated.   4. The mitral valve is grossly normal. Trivial mitral valve  regurgitation. Moderate mitral annular calcification.   5. The aortic valve is tricuspid. There is mild  calcification of the  aortic valve. There is mild thickening of the aortic valve. Aortic valve  regurgitation is trivial. Aortic valve sclerosis/calcification is present,  without any evidence of aortic  stenosis.   6. Aortic dilatation noted. There is mild dilatation of the aortic root,  measuring 39 mm.   Comparison(s): Compared to prior TTE in 2020, there is no significant  change.    EKG:  EKG Performed March 13, 2022 demonstrated normal sinus rhythm with 1 PVC.  Recent Labs: 04/07/2022: ALT 10; BUN 17; Creatinine, Ser 0.92; Hemoglobin 12.4; Platelets 174.0; Potassium 4.6; Sodium 140  Recent Lipid Panel    Component Value Date/Time   CHOL 101 07/21/2022 0855   CHOL 111 03/22/2020 0723   TRIG  177.0 (H) 07/21/2022 0855   HDL 26.40 (L) 07/21/2022 0855   HDL 24 (L) 03/22/2020 0723   CHOLHDL 4 07/21/2022 0855   VLDL 35.4 07/21/2022 0855   LDLCALC 39 07/21/2022 0855   LDLCALC 64 07/18/2021 1512   LDLDIRECT 52.0 03/04/2018 0745    Physical Exam:    VS:  BP 119/68   Pulse 66   Ht 6\' 2"  (1.88 m)   Wt 258 lb 12.8 oz (117.4 kg)   SpO2 96%   BMI 33.23 kg/m     Wt Readings from Last 3 Encounters:  09/04/22 258 lb 12.8 oz (117.4 kg)  07/21/22 265 lb (120.2 kg)  04/07/22 260 lb (117.9 kg)     GEN: Overweight. No acute distress HEENT: Normal NECK: No JVD. LYMPHATICS: No lymphadenopathy CARDIAC: No murmur. RRR no gallop, or edema. VASCULAR:  Normal Pulses. No bruits. RESPIRATORY:  Clear to auscultation without rales, wheezing or rhonchi  ABDOMEN: Soft, non-tender, non-distended, No pulsatile mass, MUSCULOSKELETAL: No deformity  SKIN: Warm and dry NEUROLOGIC:  Alert and oriented x 3 PSYCHIATRIC:  Normal affect   ASSESSMENT:    1. Chronic combined systolic and diastolic heart failure (HCC)   2. 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 Mixed hyperlipidemia   4. Mild dilation of ascending aorta (HCC)   5. Claudication ([place])   6. Prediabetes   7. History of CVA  (cerebrovascular accident)    PLAN:    In order of problems listed above:  Continue triple drug therapy as noted above.  The patient does not want to be placed on SGLT2 therapy again in the future.  I have asked that he attach with Dr. Marlou Porch and have a 53-month follow-up. Denies lower extremity claudication symptoms. Continue Lipitor 40 mg/day Will need to have follow-up on aorta to rule out progression and aortic diameter noted on echo in January 2023 in the future. Denies claudication Not on SGLT2 therapy.  Not sure that anything to do with the cellulitis but he is determined that it did and his valve not to take it again.   Plan to follow-up with Dr. Marlou Porch in 6 months.   Medication Adjustments/Labs and Tests Ordered: Current medicines are reviewed at length with the patient today.  Concerns regarding medicines are outlined above.  No orders of the defined types were placed in this encounter.  Meds ordered this encounter  Medications   sacubitril-valsartan (ENTRESTO) 49-51 MG    Sig: Take 1 tablet by mouth 2 (two) times daily.    Dispense:  180 tablet    Refill:  3    Patient Instructions  Medication Instructions:  Your physician recommends that you continue on your current medications as directed. Please refer to the Current Medication list given to you today.  *If you need a refill on your cardiac medications before your next appointment, please call your pharmacy*  Lab Work: NONE  Testing/Procedures: NONE  Follow-Up: At [facility], you and your health needs are our priority.  As part of our continuing mission to provide you with exceptional heart care, we have created designated Provider Care Teams.  These Care Teams include your primary Cardiologist (physician) and Advanced Practice Providers (APPs -  Physician Assistants and Nurse Practitioners) who all work together to provide you with the care you need, when you need it.  Your next appointment:   6  month(s)  The format for your next appointment:   In Person  Provider:   Candee Furbish, MD  Important Information About Sugar         Signed, Jason Noe, MD  09/04/2022 9:00 AM    [facility]

## 2022-09-04 ENCOUNTER — Ambulatory Visit: Payer: PPO | Attending: Interventional Cardiology | Admitting: Interventional Cardiology

## 2022-09-04 ENCOUNTER — Encounter: Payer: Self-pay | Admitting: Interventional Cardiology

## 2022-09-04 VITALS — BP 119/68 | HR 66 | Ht 74.0 in | Wt 258.8 lb

## 2022-09-04 DIAGNOSIS — I5042 Chronic combined systolic (congestive) and diastolic (congestive) heart failure: Secondary | ICD-10-CM

## 2022-09-04 DIAGNOSIS — I739 Peripheral vascular disease, unspecified: Secondary | ICD-10-CM | POA: Diagnosis not present

## 2022-09-04 DIAGNOSIS — E782 Mixed hyperlipidemia: Secondary | ICD-10-CM | POA: Diagnosis not present

## 2022-09-04 DIAGNOSIS — I77810 Thoracic aortic ectasia: Secondary | ICD-10-CM

## 2022-09-04 DIAGNOSIS — R7303 Prediabetes: Secondary | ICD-10-CM

## 2022-09-04 DIAGNOSIS — Z8673 Personal history of transient ischemic attack (TIA), and cerebral infarction without residual deficits: Secondary | ICD-10-CM

## 2022-09-04 MED ORDER — ENTRESTO 49-51 MG PO TABS: 1.0000 | ORAL_TABLET | Freq: Two times a day (BID) | ORAL | 3 refills | Status: DC

## 2022-09-04 NOTE — Patient Instructions (Signed)
Medication Instructions:  Your physician recommends that you continue on your current medications as directed. Please refer to the Current Medication list given to you today.  *If you need a refill on your cardiac medications before your next appointment, please call your pharmacy*   Lab Work: NONE  Testing/Procedures: NONE  Follow-Up: At [facility], you and your health needs are our priority.  As part of our continuing mission to provide you with exceptional heart care, we have created designated Provider Care Teams.  These Care Teams include your primary Cardiologist (physician) and Advanced Practice Providers (APPs -  Physician Assistants and Nurse Practitioners) who all work together to provide you with the care you need, when you need it.  Your next appointment:   6 month(s)  The format for your next appointment:   In Person  Provider:   Mark Skains, MD    Important Information About Sugar       

## 2022-11-11 ENCOUNTER — Other Ambulatory Visit: Payer: Self-pay | Admitting: *Deleted

## 2022-11-11 DIAGNOSIS — I5042 Chronic combined systolic (congestive) and diastolic (congestive) heart failure: Secondary | ICD-10-CM

## 2022-11-11 MED ORDER — SPIRONOLACTONE 25 MG PO TABS: 25.0000 mg | ORAL_TABLET | Freq: Every day | ORAL | 3 refills | Status: DC

## 2022-11-25 ENCOUNTER — Other Ambulatory Visit: Payer: Self-pay

## 2022-11-25 DIAGNOSIS — E782 Mixed hyperlipidemia: Secondary | ICD-10-CM

## 2022-11-25 MED ORDER — CARVEDILOL 12.5 MG PO TABS: 12.5000 mg | ORAL_TABLET | Freq: Two times a day (BID) | ORAL | 3 refills | Status: DC

## 2023-01-05 ENCOUNTER — Telehealth: Payer: Self-pay | Admitting: Primary Care

## 2023-01-05 NOTE — Telephone Encounter (Signed)
Called patient to schedule Medicare Annual Wellness Visit (AWV). Left message for patient to call back and schedule Medicare Annual Wellness Visit (AWV).   AWV-I: 07/21/2022  Please schedule an appointment at any time with [facility].  If any questions, please contact me at [phone].  Thank you ,  Randon Goldsmith Care Guide [facility] AWV TEAM Direct Dial: [phone]

## 2023-01-19 ENCOUNTER — Encounter: Payer: Self-pay | Admitting: Primary Care

## 2023-01-19 ENCOUNTER — Ambulatory Visit (INDEPENDENT_AMBULATORY_CARE_PROVIDER_SITE_OTHER): Payer: PPO | Admitting: Primary Care

## 2023-01-19 VITALS — BP 132/72 | HR 71 | Temp 97.4°F | Ht 74.0 in | Wt 272.0 lb

## 2023-01-19 DIAGNOSIS — E1165 Type 2 diabetes mellitus with hyperglycemia: Secondary | ICD-10-CM

## 2023-01-19 DIAGNOSIS — Z7984 Long term (current) use of oral hypoglycemic drugs: Secondary | ICD-10-CM | POA: Diagnosis not present

## 2023-01-19 LAB — POCT GLYCOSYLATED HEMOGLOBIN (HGB A1C): Hemoglobin A1C: 6.7 % — AB (ref 4.0–5.6)

## 2023-01-19 NOTE — Progress Notes (Signed)
Subjective:    Patient ID: Jason Ribas., male    DOB: [birth year], 67 y.o.   MRN: [id number]  HPI  Jason Stieg. is a very pleasant 67 y.o. male with a history of CVA, hypertension, type 2 diabetes, hepatic steatosis who presents today to for follow up of diabetes.  Current medications include: metformin 1000 mg BID  He is checking his blood glucose 1 times daily and is getting readings of:  AM fasting: mid 100's  Last A1C: 6.8 in November 2023, 6.7 today Last Eye Exam: Due Last Foot Exam: Due Pneumonia Vaccination: 2023 Urine Microalbumin: UTD Statin: atorvastatin   Dietary changes since last visit: Home cooked meals, protein/veggie/starch.    Exercise: Inactive due to left knee pain.   BP Readings from Last 3 Encounters:  01/19/23 132/72  09/04/22 119/68  07/21/22 128/74       Review of Systems  Eyes:  Negative for visual disturbance.  Cardiovascular:  Negative for chest pain.  Musculoskeletal:  Positive for arthralgias.  Neurological:  Negative for numbness.         Past Medical History:  Diagnosis Date   Cardiopulmonary arrest (HCC)    Cellulitis of right lower extremity 03/13/2022   Diabetes mellitus without complication (HCC)    History of CVA (cerebrovascular accident) 10/13/2016   HTN (hypertension)    Hyperlipidemia    Sepsis (HCC) 03/14/2022   Sepsis due to undetermined organism (HCC) 03/13/2022   Stroke (HCC)    Syncope 03/30/2018    Social History   Socioeconomic History   Marital status: Married    Spouse name: Not on file   Number of children: Not on file   Years of education: Not on file   Highest education level: Not on file  Occupational History   Not on file  Tobacco Use   Smoking status: Never   Smokeless tobacco: Never  Vaping Use   Vaping Use: Never used  Substance and Sexual Activity   Alcohol use: No   Drug use: No   Sexual activity: Not on file  Other Topics Concern   Not on file  Social  History Narrative   Marital status: married      Children: 2 daughters      Lives:  With wife      Employment: printing work      Tobacco: none      Alcohol: one beer every three months      Drugs:  None      Exercise:  No formal exercise; walks up three flights of stairs several times per day at work.   Lives at home.  Works for [employer].     2 yrs college.     Social Determinants of Health   Financial Resource Strain: Not on file  Food Insecurity: Not on file  Transportation Needs: Not on file  Physical Activity: Not on file  Stress: Not on file  Social Connections: Not on file  Intimate Partner Violence: Not on file    Past Surgical History:  Procedure Laterality Date   ENDARTERECTOMY Left 10/16/2016   Procedure: ENDARTERECTOMY CAROTID;  Surgeon: Nada Libman, MD;  Location: [facility] OR;  Service: Vascular;  Laterality: Left;   HERNIA REPAIR     PATCH ANGIOPLASTY Left 10/16/2016   Procedure: PATCH ANGIOPLASTY USING Livia Snellen BIOLOGIC PATCH;  Surgeon: Nada Libman, MD;  Location: [facility] OR;  Service: Vascular;  Laterality: Left;    Family History  Problem Relation  Age of Onset   Diabetes Mother    Heart disease Father 30       CABG   Diabetes Father    Kidney disease Father    Kidney failure Father    Diabetes Sister    Hypertension Sister     Allergies  Allergen Reactions   Hydralazine Anaphylaxis and Other (See Comments)    THE PATIENT CODED BECAUSE OF A SUDDEN DROP IN B/P!!!! HAD TO RECEIVE CPR!!   Jason Melendez [Dapagliflozin] Other (See Comments)    Current Outpatient Medications on File Prior to Visit  Medication Sig Dispense Refill   ACCU-CHEK FASTCLIX LANCETS MISC USE TO CHECK BLOOD SUGARS ONCE DAILY AS DIRECTED 102 each 5   acetaminophen (TYLENOL) 650 MG CR tablet Take 650 mg by mouth 2 (two) times daily.     aspirin 325 MG tablet Take 1 tablet (325 mg total) by mouth daily. 60 tablet 0   atorvastatin (LIPITOR) 40 MG tablet TAKE 1 TABLET BY MOUTH  EVERY DAY FOR CHOLESTEROL 90 tablet 3   blood glucose meter kit and supplies KIT Dispense based on patient and insurance preference. Use up to four times daily as directed. (FOR ICD-9 250.00, 250.01). 1 each 0   carvedilol (COREG) 12.5 MG tablet Take 1 tablet (12.5 mg total) by mouth in the morning and at bedtime. 180 tablet 3   escitalopram (LEXAPRO) 10 MG tablet TAKE 1 TABLET (10 MG TOTAL) BY MOUTH DAILY. FOR ANXIETY. 90 tablet 2   furosemide (LASIX) 20 MG tablet TAKE 1 TABLET BY MOUTH EVERY DAY AS NEEDED FOR FLUID/EDEMA (WEIGHT GAIN 3+ LBS OVERNIGHT) 30 tablet 11   glucose blood test strip Use as instructed 300 each 5   metFORMIN (GLUCOPHAGE) 1000 MG tablet TAKE 1 TABLET (1,000 MG TOTAL) BY MOUTH 2 (TWO) TIMES DAILY WITH A MEAL. FOR DIABETES. 180 tablet 1   sacubitril-valsartan (ENTRESTO) 49-51 MG Take 1 tablet by mouth 2 (two) times daily. 180 tablet 3   spironolactone (ALDACTONE) 25 MG tablet Take 1 tablet (25 mg total) by mouth daily. 90 tablet 3   SYSTANE ULTRA PF 0.4-0.3 % SOLN Place 1 drop into both eyes 2 (two) times daily as needed (for irritation).     Misc Natural Products (OSTEO BI-FLEX ADV JOINT SHIELD) TABS Take 1 tablet by mouth 2 (two) times daily. (Patient not taking: Reported on 01/19/2023)     No current facility-administered medications on file prior to visit.    BP 132/72   Pulse 71   Temp (!) 97.4 F (36.3 C) (Temporal)   Ht 6\' 2"  (1.88 m)   Wt 272 lb (123.4 kg)   SpO2 97%   BMI 34.92 kg/m  Objective:   Physical Exam Cardiovascular:     Rate and Rhythm: Normal rate and regular rhythm.  Pulmonary:     Effort: Pulmonary effort is normal.     Breath sounds: Normal breath sounds. No wheezing or rales.  Musculoskeletal:     Cervical back: Neck supple.  Skin:    General: Skin is warm and dry.  Neurological:     Mental Status: He is alert and oriented to person, place, and time.           Assessment & Plan:  Type 2 diabetes mellitus with hyperglycemia,  without long-term current use of insulin (HCC) Assessment & Plan: Overall controlled with A1C of 6.7 today.  Continue metformin 1000 mg BID. Discussed to work on diet and increase activity level.  Foot exam today.  Follow up in 6 months.   Orders: -     POCT glycosylated hemoglobin (Hb A1C)        Doreene Nest, NP

## 2023-01-19 NOTE — Patient Instructions (Signed)
Increase your activity level.  Continue to work on your diet and limit portion sizes.   Please schedule a physical to meet with me in 6 months.   It was a pleasure to see you today!

## 2023-01-19 NOTE — Assessment & Plan Note (Addendum)
Overall controlled with A1C of 6.7 today.  Continue metformin 1000 mg BID. Discussed to work on diet and increase activity level.  Foot exam today.   Follow up in 6 months.

## 2023-02-17 ENCOUNTER — Other Ambulatory Visit: Payer: Self-pay | Admitting: Primary Care

## 2023-02-17 DIAGNOSIS — E119 Type 2 diabetes mellitus without complications: Secondary | ICD-10-CM

## 2023-02-26 ENCOUNTER — Ambulatory Visit: Payer: PPO | Admitting: Cardiology

## 2023-03-02 ENCOUNTER — Encounter: Payer: Self-pay | Admitting: Cardiology

## 2023-03-02 ENCOUNTER — Ambulatory Visit: Payer: PPO | Attending: Cardiology | Admitting: Cardiology

## 2023-03-02 VITALS — BP 126/72 | HR 63 | Ht 74.0 in | Wt 267.6 lb

## 2023-03-02 DIAGNOSIS — Z8673 Personal history of transient ischemic attack (TIA), and cerebral infarction without residual deficits: Secondary | ICD-10-CM

## 2023-03-02 DIAGNOSIS — I5042 Chronic combined systolic (congestive) and diastolic (congestive) heart failure: Secondary | ICD-10-CM | POA: Diagnosis not present

## 2023-03-02 DIAGNOSIS — E782 Mixed hyperlipidemia: Secondary | ICD-10-CM

## 2023-03-02 MED ORDER — FUROSEMIDE 20 MG PO TABS: ORAL_TABLET | ORAL | 3 refills | Status: DC

## 2023-03-02 NOTE — Progress Notes (Signed)
Cardiology Office Note:  .   Date:  03/02/2023  ID:  [name]., DOB 1956-05-27, MRN [id number] PCP: Doreene Nest, NP  [facility] Providers Cardiologist:  Lesleigh Noe, MD (Inactive)    History of Present Illness: .   Jason Melendez. is a 67 y.o. male former patient of Dr. Verdis Prime is here for follow-up chronic systolic heart failure.  Gwenith Spitz prior nurse, Dr Katrinka Blazing, sister.   History of left brain stroke 2018 Left carotid endarterectomy 2018 Hypertensive heart disease Recurrent syncope vasovagal-golf tourney Nonischemic cardiomyopathy secondary to hypertension - Negative stress test 2018, no cardiac cath  Previously had infectious illness of right lower extremity 2023-Farxiga was stopped because of this. Almost lost his right leg. Sepsis.   No chest pain no shortness of breath no claudication  Photo retouch by trade. Hart Robinsons Following [place]. 18th hole hard to get up hill. Leaned tree. Fainted with his camera pointed towards the golfer. Head first into sand trap.  Did not remember it.  Was an ambulance.  [place] put a golf ball signed in his pocket while he was out.  ROS: As above  Studies Reviewed: Marland Kitchen   EKG Interpretation Date/Time:  Tuesday March 02 2023 11:30:53 EDT Ventricular Rate:  63 PR Interval:    QRS Duration:  86 QT Interval:  374 QTC Calculation: 382 R Axis:   102  Text Interpretation: Sinus rhythm Artifact Rightward axis Nonspecific T wave abnormality When compared with ECG of 13-Mar-2022 11:10,No PVC Confirmed by Donato Schultz ([id number]) on 03/02/2023 11:35:40 AM    Echocardiogram 2023-EF 40 to 45% grade 1 diastolic dysfunction mildly dilated left atrium moderate MAC mild sclerosis of aortic valve no stenosis  Risk Assessment/Calculations:            Physical Exam:   VS:  BP 126/72   Pulse 63   Ht 6\' 2"  (1.88 m)   Wt 267 lb 9.6 oz (121.4 kg)   SpO2 98%   BMI 34.36 kg/m    Wt Readings from  Last 3 Encounters:  03/02/23 267 lb 9.6 oz (121.4 kg)  01/19/23 272 lb (123.4 kg)  09/04/22 258 lb 12.8 oz (117.4 kg)    GEN: Well nourished, well developed in no acute distress NECK: No JVD; No carotid bruits CARDIAC: RRR, no murmurs, rubs, gallops RESPIRATORY:  Clear to auscultation without rales, wheezing or rhonchi  ABDOMEN: Soft, non-tender, non-distended EXTREMITIES:  No edema; No deformity, left knee brace  ASSESSMENT AND PLAN: .    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  Infectious disease of lower extremity felt to be made worse by [place].  Does not wish to use in the future.  Appears euvolemic - On carvedilol 12.5 mg twice a day - Lasix 20 mg daily - Entresto 49/51 twice a day - Spironolactone 25 mg once a day -Prior creatinine 0.92 potassium 4.6  Hyperlipidemia -LDL 39, excellent.  Triglycerides 177 - Continue with atorvastatin 40 mg for medication management no changes made.  No myalgias.  Left-sided stroke with left carotid endarterectomy 2018 - Goal-directed medical therapy including aspirin. Heat intolerance.   Prior syncope -- Remember to stay hydrated.  Fainted at golf tournament while photographing.  He may hold his Lasix the night before he needs to go out into the heat.      Dispo: Months with APP, 1 year with me  Signed, Candee Furbish, MD

## 2023-03-02 NOTE — Patient Instructions (Signed)
Medication Instructions:  Please take Furosemide 20 mg once a day. Continue all other medications as listed.  *If you need a refill on your cardiac medications before your next appointment, please call your pharmacy*  Follow-Up: At [facility], you and your health needs are our priority.  As part of our continuing mission to provide you with exceptional heart care, we have created designated Provider Care Teams.  These Care Teams include your primary Cardiologist (physician) and Advanced Practice Providers (APPs -  Physician Assistants and Nurse Practitioners) who all work together to provide you with the care you need, when you need it.  We recommend signing up for the patient portal called "MyChart".  Sign up information is provided on this After Visit Summary.  MyChart is used to connect with patients for Virtual Visits (Telemedicine).  Patients are able to view lab/test results, encounter notes, upcoming appointments, etc.  Non-urgent messages can be sent to your provider as well.   To learn more about what you can do with MyChart, go to [email or web address].    Your next appointment:   6 month(s)  Provider:   Jari Favre, PA-C, Robin Searing, NP, Jacolyn Reedy, PA-C, Eligha Bridegroom, NP, Tereso Newcomer, PA-C, or Perlie Gold, PA-C     Then, Dr Donato Schultz will plan to see you again in 1 year(s).

## 2023-03-07 ENCOUNTER — Other Ambulatory Visit: Payer: Self-pay | Admitting: Primary Care

## 2023-03-07 DIAGNOSIS — F411 Generalized anxiety disorder: Secondary | ICD-10-CM

## 2023-05-28 ENCOUNTER — Other Ambulatory Visit: Payer: Self-pay | Admitting: Primary Care

## 2023-05-28 DIAGNOSIS — E7849 Other hyperlipidemia: Secondary | ICD-10-CM

## 2023-06-04 ENCOUNTER — Telehealth: Payer: Self-pay | Admitting: Primary Care

## 2023-06-04 NOTE — Telephone Encounter (Signed)
Prescription Request  06/04/2023  LOV: 01/19/2023  What is the name of the medication or equipment? metFORMIN (GLUCOPHAGE) 1000 MG tablet   Have you contacted your pharmacy to request a refill? No   Which pharmacy would you like this sent to?  [id number] [place], [place] - [address] [place] [address] Phone: [phone] Fax: [phone]    Patient notified that their request is being sent to the clinical staff for review and that they should receive a response within 2 business days.   Please advise at Mobile [phone] (mobile)

## 2023-06-04 NOTE — Telephone Encounter (Signed)
Refilled for 6 months in June 2024. Needs to call pharmacy for a refill if he's running low. Should have enough pills to last until mid December.

## 2023-06-04 NOTE — Telephone Encounter (Signed)
Left message to return call to our office.  

## 2023-06-07 NOTE — Telephone Encounter (Signed)
Left voicemail for patient per dpr. Advised him of Isabella Stalling message and advised to callback with any further questions

## 2023-07-20 NOTE — Progress Notes (Signed)
Cardiology Office Note:  .   Date:  08/03/2023  ID:  Jason Ribas., DOB 22-Dec-1955, MRN [id number] PCP: Doreene Nest, NP  [facility] Providers Cardiologist:  Donato Schultz, MD    History of Present Illness: .   Jason Jha. is a 67 y.o. male with a  History of left brain stroke 2018,Left carotid endarterectomy 2018,Hypertensive heart disease Recurrent syncope vasovagal-golf tourney,Nonischemic cardiomyopathy secondary to hypertension - Negative stress test 2018, no cardiac cath.   Patient saw Dr. Anne Fu 03/2023 doing well.  Patient comes in for regular f/u. Works as a Development worker, international aid. Overall doing well. Denies chest pain, dyspnea, edema, palpitations. Gets dizzy when he bends over. Not able to exercise because of knee problems. Labs reviewed trig 184, glucose 174.A1C.  ROS:    Studies Reviewed: Marland Kitchen         Prior CV Studies:   Echo 08/2021 IMPRESSIONS     1. Left ventricular ejection fraction, by estimation, is 40 to 45%. The  left ventricle has mildly decreased function. The left ventricle  demonstrates global hypokinesis. There is mild concentric left ventricular  hypertrophy. Left ventricular diastolic  parameters are consistent with Grade I diastolic dysfunction (impaired  relaxation).   2. Right ventricular systolic function is normal. The right ventricular  size is normal.   3. Left atrial size was mildly dilated.   4. The mitral valve is grossly normal. Trivial mitral valve  regurgitation. Moderate mitral annular calcification.   5. The aortic valve is tricuspid. There is mild calcification of the  aortic valve. There is mild thickening of the aortic valve. Aortic valve  regurgitation is trivial. Aortic valve sclerosis/calcification is present,  without any evidence of aortic  stenosis.   6. Aortic dilatation noted. There is mild dilatation of the aortic root,  measuring 39 mm.   Comparison(s): Compared to  prior TTE in 2020, there is no significant  change.  Risk Assessment/Calculations:             Physical Exam:   VS:  BP (!) 100/58 (BP Location: Left Arm, Patient Position: Sitting, Cuff Size: Large)   Pulse 81   Wt 269 lb 6.4 oz (122.2 kg)   SpO2 92%   BMI 36.54 kg/m    Wt Readings from Last 3 Encounters:  08/03/23 269 lb 6.4 oz (122.2 kg)  07/23/23 269 lb (122 kg)  03/02/23 267 lb 9.6 oz (121.4 kg)    GEN: Obese, in no acute distress NECK: No JVD; No carotid bruits CARDIAC:  RRR, no murmurs, rubs, gallops RESPIRATORY:  Clear to auscultation without rales, wheezing or rhonchi  ABDOMEN: Soft, non-tender, non-distended EXTREMITIES:  No edema; No deformity   ASSESSMENT AND PLAN: .   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  No myalgias.   Left-sided stroke with left carotid endarterectomy 2018 - Goal-directed medical therapy including aspirin. Heat intolerance.    Prior syncope and BP low today -- Remember to stay hydrated.  Fainted at golf tournament while  photographing.  Change lasix to prn and keep track of BP's-send in 2 weeks.   Obesity-exercise and weight loss discussed.           Dispo: f/u with Dr. Anne Fu in 6 months  Signed, Jason Reedy, PA-C

## 2023-07-23 ENCOUNTER — Ambulatory Visit: Payer: PPO | Admitting: Primary Care

## 2023-07-23 ENCOUNTER — Encounter: Payer: Self-pay | Admitting: Primary Care

## 2023-07-23 VITALS — BP 132/74 | HR 76 | Temp 97.6°F | Ht 72.0 in | Wt 269.0 lb

## 2023-07-23 DIAGNOSIS — E119 Type 2 diabetes mellitus without complications: Secondary | ICD-10-CM

## 2023-07-23 DIAGNOSIS — Z0000 Encounter for general adult medical examination without abnormal findings: Secondary | ICD-10-CM

## 2023-07-23 DIAGNOSIS — Z125 Encounter for screening for malignant neoplasm of prostate: Secondary | ICD-10-CM

## 2023-07-23 DIAGNOSIS — F411 Generalized anxiety disorder: Secondary | ICD-10-CM

## 2023-07-23 DIAGNOSIS — Z8673 Personal history of transient ischemic attack (TIA), and cerebral infarction without residual deficits: Secondary | ICD-10-CM

## 2023-07-23 DIAGNOSIS — Z23 Encounter for immunization: Secondary | ICD-10-CM

## 2023-07-23 DIAGNOSIS — M25561 Pain in right knee: Secondary | ICD-10-CM

## 2023-07-23 DIAGNOSIS — M25562 Pain in left knee: Secondary | ICD-10-CM

## 2023-07-23 DIAGNOSIS — Z7984 Long term (current) use of oral hypoglycemic drugs: Secondary | ICD-10-CM

## 2023-07-23 DIAGNOSIS — E1165 Type 2 diabetes mellitus with hyperglycemia: Secondary | ICD-10-CM

## 2023-07-23 DIAGNOSIS — E7849 Other hyperlipidemia: Secondary | ICD-10-CM

## 2023-07-23 DIAGNOSIS — I10 Essential (primary) hypertension: Secondary | ICD-10-CM | POA: Diagnosis not present

## 2023-07-23 DIAGNOSIS — G8929 Other chronic pain: Secondary | ICD-10-CM

## 2023-07-23 DIAGNOSIS — I700 Atherosclerosis of aorta: Secondary | ICD-10-CM

## 2023-07-23 DIAGNOSIS — I5042 Chronic combined systolic (congestive) and diastolic (congestive) heart failure: Secondary | ICD-10-CM

## 2023-07-23 LAB — COMPREHENSIVE METABOLIC PANEL
ALT: 12 U/L (ref 0–53)
AST: 18 U/L (ref 0–37)
Albumin: 4.2 g/dL (ref 3.5–5.2)
Alkaline Phosphatase: 31 U/L — ABNORMAL LOW (ref 39–117)
BUN: 16 mg/dL (ref 6–23)
CO2: 29 meq/L (ref 19–32)
Calcium: 9.3 mg/dL (ref 8.4–10.5)
Chloride: 103 meq/L (ref 96–112)
Creatinine, Ser: 1.07 mg/dL (ref 0.40–1.50)
GFR: 72.1 mL/min (ref >60.00)
Glucose, Bld: 174 mg/dL — ABNORMAL HIGH (ref 70–99)
Potassium: 4.6 meq/L (ref 3.5–5.1)
Sodium: 141 meq/L (ref 135–145)
Total Bilirubin: 0.8 mg/dL (ref 0.2–1.2)
Total Protein: 6.6 g/dL (ref 6.0–8.3)

## 2023-07-23 LAB — MICROALBUMIN / CREATININE URINE RATIO
Creatinine,U: 75.4 mg/dL
Microalb Creat Ratio: 1.3 mg/g (ref 0.0–30.0)
Microalb, Ur: 1 mg/dL (ref 0.0–1.9)

## 2023-07-23 LAB — LIPID PANEL
Cholesterol: 112 mg/dL (ref 0–200)
HDL: 24.2 mg/dL — ABNORMAL LOW (ref >39.00)
LDL Cholesterol: 51 mg/dL (ref 0–99)
NonHDL: 87.67
Total CHOL/HDL Ratio: 5
Triglycerides: 184 mg/dL — ABNORMAL HIGH (ref 0.0–149.0)
VLDL: 36.8 mg/dL (ref 0.0–40.0)

## 2023-07-23 LAB — HEMOGLOBIN A1C: Hgb A1c MFr Bld: 7 % — ABNORMAL HIGH (ref 4.6–6.5)

## 2023-07-23 LAB — PSA, MEDICARE: PSA: 1.12 ng/mL (ref 0.10–4.00)

## 2023-07-23 MED ORDER — ATORVASTATIN CALCIUM 40 MG PO TABS
40.0000 mg | ORAL_TABLET | Freq: Every day | ORAL | 3 refills | Status: DC
Start: 2023-07-23 — End: 2024-05-16

## 2023-07-23 MED ORDER — METFORMIN HCL 1000 MG PO TABS: 1000.0000 mg | ORAL_TABLET | Freq: Two times a day (BID) | ORAL | 1 refills | Status: DC

## 2023-07-23 NOTE — Assessment & Plan Note (Signed)
Chronic, ongoing. Recommended he set up a visit with Sports medicine.

## 2023-07-23 NOTE — Patient Instructions (Signed)
Stop by the lab prior to leaving today. I will notify you of your results once received.   Please schedule a follow up visit for 6 months for a diabetes check.  It was a pleasure to see you today!

## 2023-07-23 NOTE — Assessment & Plan Note (Signed)
No new symptoms. Continue lipid and BP control.

## 2023-07-23 NOTE — Assessment & Plan Note (Signed)
Repeat A1C pending. Urine microalbumin pending.  Continue metformin 1000 mg BID.  Follow up in 6 months.

## 2023-07-23 NOTE — Assessment & Plan Note (Signed)
Repeat lipid panel pending. LDL goal below 70. Continue atorvastatin 40 mg daily.

## 2023-07-23 NOTE — Assessment & Plan Note (Signed)
Controlled.  Continue Lexapro 10 mg daily.

## 2023-07-23 NOTE — Assessment & Plan Note (Signed)
Repeat lipid panel pending.  Continue atorvastatin 40 mg daily.

## 2023-07-23 NOTE — Progress Notes (Signed)
Subjective:    Patient ID: [name]., male    DOB: [birth year], 67 y.o.   MRN: [id number]  HPI  Ngawang Schoof. is a very pleasant 67 y.o. male who presents today for complete physical and follow up of chronic conditions.  Immunizations: -Tetanus: Completed in 2016 -Influenza: Influenza vaccine provided today.  -Shingles: Completed Shingrix series -Pneumonia: Completed Prevnar 20 in 2023  Diet: Fair diet.  Exercise: No regular exercise.  Eye exam: Completes annually  Dental exam: Completes semi-annually    Colonoscopy: Completed Cologuard in 2023, due    PSA: Due   BP Readings from Last 3 Encounters:  07/23/23 132/74  03/02/23 126/72  01/19/23 132/72         Review of Systems  Constitutional:  Negative for unexpected weight change.  HENT:  Negative for rhinorrhea.   Respiratory:  Negative for cough and shortness of breath.   Cardiovascular:  Negative for chest pain.  Gastrointestinal:  Negative for constipation and diarrhea.  Genitourinary:  Negative for difficulty urinating.  Musculoskeletal:  Positive for arthralgias.  Skin:  Negative for rash.  Allergic/Immunologic: Negative for environmental allergies.  Neurological:  Negative for dizziness and headaches.  Psychiatric/Behavioral:  The patient is not nervous/anxious.          Past Medical History:  Diagnosis Date   Allergy Feb 2018   Hydozalin   Cardiopulmonary arrest [facility])    Cellulitis of right lower extremity 03/13/2022   Diabetes mellitus without complication (HCC)    History of CVA (cerebrovascular accident) 10/13/2016   HTN (hypertension)    Hyperlipidemia    Sepsis (HCC) 03/14/2022   Sepsis due to undetermined organism (HCC) 03/13/2022   Stroke (HCC)    Syncope 03/30/2018    Social History   Socioeconomic History   Marital status: Married    Spouse name: Not on file   Number of children: Not on file   Years of education: Not on file   Highest education  level: Not on file  Occupational History   Not on file  Tobacco Use   Smoking status: Never   Smokeless tobacco: Never  Vaping Use   Vaping status: Never Used  Substance and Sexual Activity   Alcohol use: No   Drug use: No   Sexual activity: Not Currently    Comment: Doctor botched hysterectomy and wife said no more sex  Other Topics Concern   Not on file  Social History Narrative   Marital status: married      Children: 2 daughters      Lives:  With wife      Employment: printing work      Tobacco: none      Alcohol: one beer every three months      Drugs:  None      Exercise:  No formal exercise; walks up three flights of stairs several times per day at work.   Lives at home.  Works for [employer].     2 yrs college.     Social Determinants of Health   Financial Resource Strain: Not on file  Food Insecurity: Not on file  Transportation Needs: Not on file  Physical Activity: Not on file  Stress: Not on file  Social Connections: Not on file  Intimate Partner Violence: Not on file    Past Surgical History:  Procedure Laterality Date   ENDARTERECTOMY Left 10/16/2016   Procedure: ENDARTERECTOMY CAROTID;  Surgeon: Nada Libman, MD;  Location: [facility]  OR;  Service: Vascular;  Laterality: Left;   HERNIA REPAIR     PATCH ANGIOPLASTY Left 10/16/2016   Procedure: PATCH ANGIOPLASTY USING Livia Snellen BIOLOGIC PATCH;  Surgeon: Nada Libman, MD;  Location: [facility] OR;  Service: Vascular;  Laterality: Left;    Family History  Problem Relation Age of Onset   Diabetes Mother    Miscarriages / Stillbirths Mother    Heart disease Father 27       CABG   Diabetes Father    Kidney disease Father    Kidney failure Father    Diabetes Sister    Hypertension Sister     Allergies  Allergen Reactions   Hydralazine Anaphylaxis and Other (See Comments)    THE PATIENT CODED BECAUSE OF A SUDDEN DROP IN B/P!!!! HAD TO RECEIVE CPR!!   [name] [Dapagliflozin] Other (See Comments)     Current Outpatient Medications on File Prior to Visit  Medication Sig Dispense Refill   ACCU-CHEK FASTCLIX LANCETS MISC USE TO CHECK BLOOD SUGARS ONCE DAILY AS DIRECTED 102 each 5   acetaminophen (TYLENOL) 650 MG CR tablet Take 650 mg by mouth 2 (two) times daily.     aspirin 325 MG tablet Take 1 tablet (325 mg total) by mouth daily. 60 tablet 0   blood glucose meter kit and supplies KIT Dispense based on patient and insurance preference. Use up to four times daily as directed. (FOR ICD-9 250.00, 250.01). 1 each 0   carvedilol (COREG) 12.5 MG tablet Take 1 tablet (12.5 mg total) by mouth in the morning and at bedtime. 180 tablet 3   escitalopram (LEXAPRO) 10 MG tablet TAKE 1 TABLET (10 MG TOTAL) BY MOUTH DAILY. FOR ANXIETY. 90 tablet 0   furosemide (LASIX) 20 MG tablet TAKE 1 TABLET BY MOUTH EVERY DAY 90 tablet 3   glucose blood test strip Use as instructed 300 each 5   sacubitril-valsartan (ENTRESTO) 49-51 MG Take 1 tablet by mouth 2 (two) times daily. 180 tablet 3   spironolactone (ALDACTONE) 25 MG tablet Take 1 tablet (25 mg total) by mouth daily. 90 tablet 3   No current facility-administered medications on file prior to visit.    BP 132/74   Pulse 76   Temp 97.6 F (36.4 C) (Oral)   Ht 6' (1.829 m)   Wt 269 lb (122 kg)   SpO2 97%   BMI 36.48 kg/m  Objective:   Physical Exam HENT:     Right Ear: Tympanic membrane and ear canal normal.     Left Ear: Tympanic membrane and ear canal normal.  Eyes:     Pupils: Pupils are equal, round, and reactive to light.  Cardiovascular:     Rate and Rhythm: Normal rate and regular rhythm.  Pulmonary:     Effort: Pulmonary effort is normal.     Breath sounds: Normal breath sounds.  Abdominal:     General: Bowel sounds are normal.     Palpations: Abdomen is soft.     Tenderness: There is no abdominal tenderness.  Musculoskeletal:        General: Normal range of motion.     Cervical back: Neck supple.  Skin:    General: Skin is  warm and dry.  Neurological:     Mental Status: He is alert and oriented to person, place, and time.     Cranial Nerves: No cranial nerve deficit.     Deep Tendon Reflexes:     Reflex Scores:      Patellar  reflexes are 2+ on the right side and 2+ on the left side. Psychiatric:        Mood and Affect: Mood normal.           Assessment & Plan:  Preventative health care Assessment & Plan: Immunizations UTD. Colonoscopy cancer screening UTD, due 2026 PSA due and pending.  Discussed the importance of a healthy diet and regular exercise in order for weight loss, and to reduce the risk of further co-morbidity.  Exam stable. Labs pending.  Follow up in 1 year for repeat physical.    Type 2 diabetes mellitus with hyperglycemia, without long-term current use of insulin (HCC) Assessment & Plan: Repeat A1C pending. Urine microalbumin pending.  Continue metformin 1000 mg BID.  Follow up in 6 months.  Orders: -     Microalbumin / creatinine urine ratio  Other hyperlipidemia Assessment & Plan: Repeat lipid panel pending.  Continue atorvastatin 40 mg daily.   Orders: -     Atorvastatin Calcium; Take 1 tablet (40 mg total) by mouth daily. For Cholesterol  Dispense: 90 tablet; Refill: 3 -     Lipid panel  Type 2 diabetes mellitus without complication, without long-term current use of insulin (HCC) Assessment & Plan: Repeat A1C pending. Urine microalbumin pending.  Continue metformin 1000 mg BID.  Follow up in 6 months.  Orders: -     metFORMIN HCl; Take 1 tablet (1,000 mg total) by mouth 2 (two) times daily with a meal. For diabetes.  Dispense: 180 tablet; Refill: 1 -     Hemoglobin A1c  Screening for prostate cancer -     PSA, Medicare  Aortic atherosclerosis (HCC) Assessment & Plan: Repeat lipid panel pending. LDL goal below 70. Continue atorvastatin 40 mg daily.  Orders: -     Lipid panel  Primary hypertension Assessment & Plan: Controlled.  Continue  carvedilol 12.5 mg BID, Entresto 49-51 mg daily, spironolactone 25 mg daily.  CMP pending.  Orders: -     Comprehensive metabolic panel  Chronic combined systolic and diastolic heart failure (HCC) Assessment & Plan: Appears euvolemic today. Following with cardiology, office notes reviewed from July 2024.  Continue carvedilol 12.5 mg twice daily, Entresto 49-51 milligrams daily, spironolactone 25 mg daily, furosemide 20 g daily as needed.    GAD (generalized anxiety disorder) Assessment & Plan: Controlled.  Continue Lexapro 10 mg daily.   History of CVA (cerebrovascular accident) Assessment & Plan: No new symptoms. Continue lipid and BP control.   Chronic pain of both knees Assessment & Plan: Chronic, ongoing. Recommended he set up a visit with Sports medicine.         Doreene Nest, NP

## 2023-07-23 NOTE — Assessment & Plan Note (Signed)
Immunizations UTD. Colonoscopy cancer screening UTD, due 2026 PSA due and pending.  Discussed the importance of a healthy diet and regular exercise in order for weight loss, and to reduce the risk of further co-morbidity.  Exam stable. Labs pending.  Follow up in 1 year for repeat physical.

## 2023-07-23 NOTE — Assessment & Plan Note (Signed)
Appears euvolemic today. Following with cardiology, office notes reviewed from July 2024.  Continue carvedilol 12.5 mg twice daily, Entresto 49-51 milligrams daily, spironolactone 25 mg daily, furosemide 20 g daily as needed.

## 2023-07-23 NOTE — Assessment & Plan Note (Signed)
Controlled.  Continue carvedilol 12.5 mg BID, Entresto 49-51 mg daily, spironolactone 25 mg daily.  CMP pending.

## 2023-08-01 ENCOUNTER — Other Ambulatory Visit: Payer: Self-pay | Admitting: Primary Care

## 2023-08-01 DIAGNOSIS — F411 Generalized anxiety disorder: Secondary | ICD-10-CM

## 2023-08-03 ENCOUNTER — Encounter: Payer: Self-pay | Admitting: Physician Assistant

## 2023-08-03 ENCOUNTER — Ambulatory Visit: Payer: PPO | Attending: Physician Assistant | Admitting: Physician Assistant

## 2023-08-03 VITALS — BP 100/58 | HR 81 | Wt 269.4 lb

## 2023-08-03 DIAGNOSIS — E782 Mixed hyperlipidemia: Secondary | ICD-10-CM

## 2023-08-03 DIAGNOSIS — Z87898 Personal history of other specified conditions: Secondary | ICD-10-CM | POA: Diagnosis not present

## 2023-08-03 DIAGNOSIS — E66812 Obesity, class 2: Secondary | ICD-10-CM

## 2023-08-03 DIAGNOSIS — Z6836 Body mass index (BMI) 36.0-36.9, adult: Secondary | ICD-10-CM

## 2023-08-03 DIAGNOSIS — I5042 Chronic combined systolic (congestive) and diastolic (congestive) heart failure: Secondary | ICD-10-CM | POA: Diagnosis not present

## 2023-08-03 DIAGNOSIS — Z8673 Personal history of transient ischemic attack (TIA), and cerebral infarction without residual deficits: Secondary | ICD-10-CM

## 2023-08-03 MED ORDER — FUROSEMIDE 20 MG PO TABS: 20.0000 mg | ORAL_TABLET | ORAL | Status: DC | PRN

## 2023-08-03 NOTE — Patient Instructions (Signed)
Medication Instructions:   CHANGE Lasix one (1) tablet by mouth ( 20 mg) as needed for wt gain of 2 lbs in 24 hours or 5 lbs in one week.  *If you need a refill on your cardiac medications before your next appointment, please call your pharmacy*   Lab Work:  None ordered.  If you have labs (blood work) drawn today and your tests are completely normal, you will receive your results only by: MyChart Message (if you have MyChart) OR A paper copy in the mail If you have any lab test that is abnormal or we need to change your treatment, we will call you to review the results.   Testing/Procedures:  None ordered.   Follow-Up: At [facility], you and your health needs are our priority.  As part of our continuing mission to provide you with exceptional heart care, we have created designated Provider Care Teams.  These Care Teams include your primary Cardiologist (physician) and Advanced Practice Providers (APPs -  Physician Assistants and Nurse Practitioners) who all work together to provide you with the care you need, when you need it.  We recommend signing up for the patient portal called "MyChart".  Sign up information is provided on this After Visit Summary.  MyChart is used to connect with patients for Virtual Visits (Telemedicine).  Patients are able to view lab/test results, encounter notes, upcoming appointments, etc.  Non-urgent messages can be sent to your provider as well.   To learn more about what you can do with MyChart, go to [email or web address].    Your next appointment:   6 month(s)  Provider:   Donato Schultz, MD     Other Instructions  Your physician wants you to follow-up in: 6 months.  You will receive a reminder letter in the mail two months in advance. If you don't receive a letter, please call our office to schedule the follow-up appointment.  Please record BP and Heart Rate and send to Jacolyn Reedy, PA in 1-2 weeks through [place].

## 2023-08-05 LAB — HM DIABETES EYE EXAM

## 2023-08-20 ENCOUNTER — Encounter: Payer: Self-pay | Admitting: Primary Care

## 2023-08-20 NOTE — Telephone Encounter (Signed)
 Care team updated and letter sent for eye exam notes.

## 2023-08-26 ENCOUNTER — Other Ambulatory Visit: Payer: Self-pay | Admitting: Cardiology

## 2023-08-26 DIAGNOSIS — I5042 Chronic combined systolic (congestive) and diastolic (congestive) heart failure: Secondary | ICD-10-CM

## 2023-09-06 ENCOUNTER — Other Ambulatory Visit: Payer: Self-pay

## 2023-09-06 DIAGNOSIS — E782 Mixed hyperlipidemia: Secondary | ICD-10-CM

## 2023-09-06 MED ORDER — ENTRESTO 49-51 MG PO TABS: 1.0000 | ORAL_TABLET | Freq: Two times a day (BID) | ORAL | 3 refills | Status: DC

## 2023-11-08 ENCOUNTER — Ambulatory Visit: Payer: Self-pay | Admitting: Primary Care

## 2023-11-08 NOTE — Telephone Encounter (Signed)
  Chief Complaint: uri Symptoms: uri s/s, rib pain with cough,  Frequency: 3-4 days Pertinent Negatives: Patient denies fever, SOB, CP when not coughing Disposition: [] ED /[] Urgent Care (no appt availability in office) / [x] Appointment(In office/virtual)/ []  [facility]/ [] Home Care/ [x] Refused Recommended Disposition /[] [place] [facility]/ []  Follow-up with PCP Additional Notes: Pt states that he has been sick with cough for about 3-4 days. Pt offered appt tomorrow, declined, stating that he was just calling to see if he could take OTC med. Pt inquiring about alka-seltzer and whether or not he can take the OTC medication d/t his extensive medical hx.  Copied from CRM [id number]. Topic: Clinical - Red Word Triage >> Nov 08, 2023 10:53 AM [place] J wrote: [facility] that prompted transfer to Nurse Triage: Patient has been using alkaseltzer plus to fight congestion. Patient also stated that when he coughs it feels like he's "breaking ribs".   Patient has heart disease, high blood pressure, and diabetes. He's also wondering if he'll be okay to continue to take over the counter medication. Reason for Disposition  SEVERE coughing spells (e.g., whooping sound after coughing, vomiting after coughing)  Answer Assessment - Initial Assessment Questions 1. ONSET: "When did the cough begin?"      3 days 2. SEVERITY: "How bad is the cough today?"      Mild/Moderate 3. SPUTUM: "Describe the color of your sputum" (none, dry cough; clear, white, yellow, green)     green 4. HEMOPTYSIS: "Are you coughing up any blood?" If so ask: "How much?" (flecks, streaks, tablespoons, etc.)     denies 5. DIFFICULTY BREATHING: "Are you having difficulty breathing?" If Yes, ask: "How bad is it?" (e.g., mild, moderate, severe)    - MILD: No SOB at rest, mild SOB with walking, speaks normally in sentences, can lie down, no retractions, pulse < 100.    - MODERATE: SOB at rest, SOB with minimal exertion and prefers to  sit, cannot lie down flat, speaks in phrases, mild retractions, audible wheezing, pulse 100-120.    - SEVERE: Very SOB at rest, speaks in single words, struggling to breathe, sitting hunched forward, retractions, pulse > 120      denies 6. FEVER: "Do you have a fever?" If Yes, ask: "What is your temperature, how was it measured, and when did it start?"     denies 7. CARDIAC HISTORY: "Do you have any history of heart disease?" (e.g., heart attack, congestive heart failure)      HTN 8. LUNG HISTORY: "Do you have any history of lung disease?"  (e.g., pulmonary embolus, asthma, emphysema)     denies 9. PE RISK FACTORS: "Do you have a history of blood clots?" (or: recent major surgery, recent prolonged travel, bedridden)     denies 10. OTHER SYMPTOMS: "Do you have any other symptoms?" (e.g., runny nose, wheezing, chest pain)       CP with cough, runny nnose  12. TRAVEL: "Have you traveled out of the country in the last month?" (e.g., travel history, exposures)       denies  Protocols used: Cough - Acute Productive-A-AH

## 2023-11-08 NOTE — Telephone Encounter (Signed)
 Unable to reach patient. Left voicemail to return call to our office.

## 2023-11-08 NOTE — Telephone Encounter (Signed)
 Given patients history it would be a good idea for him to be evaluated in person in office. He can use over the counter delsym to help with the cough. If he is using the alka seltzer with a decongestant in it he needs to avoid that and get one that does not have a decongestant in it

## 2023-11-09 NOTE — Telephone Encounter (Signed)
 Unable to reach patient. Left voicemail to return call to our office.

## 2023-11-10 NOTE — Telephone Encounter (Signed)
 Unable to reach patient. Left voicemail to return call to our office.

## 2023-11-11 NOTE — Telephone Encounter (Signed)
 Unable to reach patient. Left voicemail to return call to our office.   4th attempt to reach patient, closing encounter, will await for patient to return call to office.

## 2023-11-21 ENCOUNTER — Other Ambulatory Visit: Payer: Self-pay | Admitting: Family

## 2023-11-21 DIAGNOSIS — E782 Mixed hyperlipidemia: Secondary | ICD-10-CM

## 2024-01-18 ENCOUNTER — Ambulatory Visit: Payer: PPO | Admitting: Primary Care

## 2024-01-21 ENCOUNTER — Encounter: Payer: Self-pay | Admitting: Primary Care

## 2024-01-21 ENCOUNTER — Ambulatory Visit: Payer: Self-pay | Admitting: Primary Care

## 2024-01-21 ENCOUNTER — Ambulatory Visit (INDEPENDENT_AMBULATORY_CARE_PROVIDER_SITE_OTHER): Payer: PPO | Admitting: Primary Care

## 2024-01-21 VITALS — BP 124/66 | HR 78 | Temp 97.2°F | Ht 72.0 in | Wt 267.0 lb

## 2024-01-21 DIAGNOSIS — E1165 Type 2 diabetes mellitus with hyperglycemia: Secondary | ICD-10-CM

## 2024-01-21 DIAGNOSIS — Z7984 Long term (current) use of oral hypoglycemic drugs: Secondary | ICD-10-CM | POA: Diagnosis not present

## 2024-01-21 LAB — POCT GLYCOSYLATED HEMOGLOBIN (HGB A1C): Hemoglobin A1C: 6.6 % — AB (ref 4.0–5.6)

## 2024-01-21 MED ORDER — GLUCOSE BLOOD VI STRP: ORAL_STRIP | 5 refills | Status: DC

## 2024-01-21 NOTE — Progress Notes (Signed)
 Subjective:    Patient ID: Jason Melendez., male    DOB: March 20, 1956, 68 y.o.   MRN: [id number]  HPI  Jason Melendez. is a very pleasant 68 y.o. male with a history of hypertension, CHF, type 2 diabetes, hyperlipidemia, CVA, hepatic steatosis who presents today for follow-up of diabetes.  Current medications include: Metformin  1000 mg twice daily  He is checking his blood glucose 1 times daily and is getting readings of:  AM fasting: 160-170  Last A1C: 7.0 in November 2024, 6.6 today Last Eye Exam: Due Last Foot Exam: Due Pneumonia Vaccination: 2023 Urine Microalbumin: Up-to-date Statin: Atorvastatin   Dietary changes since last visit: Home cooked meals mostly. He does eat starchy foods including bread and rice.    Exercise: Active    Review of Systems  Eyes:  Negative for visual disturbance.  Respiratory:  Negative for shortness of breath.   Cardiovascular:  Negative for chest pain.  Neurological:  Positive for numbness.         Past Medical History:  Diagnosis Date   Allergy Feb 2018   Hydozalin   Cardiopulmonary arrest [facility])    Cellulitis of right lower extremity 03/13/2022   Diabetes mellitus without complication (HCC)    History of CVA (cerebrovascular accident) 10/13/2016   HTN (hypertension)    Hyperlipidemia    Sepsis (HCC) 03/14/2022   Sepsis due to undetermined organism (HCC) 03/13/2022   Stroke (HCC)    Syncope 03/30/2018    Social History   Socioeconomic History   Marital status: Married    Spouse name: Not on file   Number of children: Not on file   Years of education: Not on file   Highest education level: Not on file  Occupational History   Not on file  Tobacco Use   Smoking status: Never   Smokeless tobacco: Never  Vaping Use   Vaping status: Never Used  Substance and Sexual Activity   Alcohol use: No   Drug use: No   Sexual activity: Not Currently    Comment: Doctor botched hysterectomy and wife said no more sex   Other Topics Concern   Not on file  Social History Narrative   Marital status: married      Children: 2 daughters      Lives:  With wife      Employment: printing work      Tobacco: none      Alcohol: one beer every three months      Drugs:  None      Exercise:  No formal exercise; walks up three flights of stairs several times per day at work.   Lives at home.  Works for [employer].     2 yrs college.     Social Drivers of Health   Financial Resource Strain: Patient Declined (01/20/2024)   Overall Financial Resource Strain (CARDIA)    Difficulty of Paying Living Expenses: Patient declined  Food Insecurity: Patient Declined (01/20/2024)   Hunger Vital Sign    Worried About Running Out of Food in the Last Year: Patient declined    Ran Out of Food in the Last Year: Patient declined  Transportation Needs: No Transportation Needs (01/20/2024)   PRAPARE - Administrator, Civil Service (Medical): No    Lack of Transportation (Non-Medical): No  Physical Activity: Not on file  Stress: Not on file  Social Connections: Unknown (01/20/2024)   Social Connection and Isolation Panel [NHANES]  Frequency of Communication with Friends and Family: Patient declined    Frequency of Social Gatherings with Friends and Family: Patient declined    Attends Religious Services: Patient declined    Active Member of Clubs or Organizations: Patient declined    Attends Banker Meetings: Not on file    Marital Status: Patient declined  Intimate Partner Violence: Not on file    Past Surgical History:  Procedure Laterality Date   ENDARTERECTOMY Left 10/16/2016   Procedure: ENDARTERECTOMY CAROTID;  Surgeon: Margherita Shell, MD;  Location: [facility] OR;  Service: Vascular;  Laterality: Left;   HERNIA REPAIR     PATCH ANGIOPLASTY Left 10/16/2016   Procedure: PATCH ANGIOPLASTY USING Corinna Dickens BIOLOGIC PATCH;  Surgeon: Margherita Shell, MD;  Location: [facility] OR;  Service: Vascular;   Laterality: Left;    Family History  Problem Relation Age of Onset   Diabetes Mother    Miscarriages / Stillbirths Mother    Heart disease Father 49       CABG   Diabetes Father    Kidney disease Father    Kidney failure Father    Diabetes Sister    Hypertension Sister     Allergies  Allergen Reactions   Hydralazine  Anaphylaxis and Other (See Comments)    THE PATIENT CODED BECAUSE OF A SUDDEN DROP IN B/P!!!! HAD TO RECEIVE CPR!!   Farxiga  [Dapagliflozin ] Other (See Comments)    Current Outpatient Medications on File Prior to Visit  Medication Sig Dispense Refill   ACCU-CHEK FASTCLIX LANCETS MISC USE TO CHECK BLOOD SUGARS ONCE DAILY AS DIRECTED 102 each 5   acetaminophen  (TYLENOL ) 650 MG CR tablet Take 650 mg by mouth 2 (two) times daily.     aspirin  325 MG tablet Take 1 tablet (325 mg total) by mouth daily. 60 tablet 0   atorvastatin  (LIPITOR) 40 MG tablet Take 1 tablet (40 mg total) by mouth daily. For Cholesterol 90 tablet 3   carvedilol  (COREG ) 12.5 MG tablet TAKE 1 TABLET (12.5 MG TOTAL) BY MOUTH IN THE MORNING AND AT BEDTIME. 180 tablet 1   escitalopram  (LEXAPRO ) 10 MG tablet TAKE 1 TABLET (10 MG TOTAL) BY MOUTH DAILY. FOR ANXIETY. 90 tablet 2   furosemide  (LASIX ) 20 MG tablet Take 1 tablet (20 mg total) by mouth as needed for fluid or edema.     metFORMIN  (GLUCOPHAGE ) 1000 MG tablet Take 1 tablet (1,000 mg total) by mouth 2 (two) times daily with a meal. For diabetes. 180 tablet 1   sacubitril -valsartan  (ENTRESTO ) 49-51 MG Take 1 tablet by mouth 2 (two) times daily. 180 tablet 3   spironolactone  (ALDACTONE ) 25 MG tablet TAKE 1 TABLET (25 MG TOTAL) BY MOUTH DAILY. 90 tablet 3   No current facility-administered medications on file prior to visit.    BP 124/66   Pulse 78   Temp (!) 97.2 F (36.2 C) (Temporal)   Ht 6' (1.829 m)   Wt 267 lb (121.1 kg)   SpO2 97%   BMI 36.21 kg/m  Objective:   Physical Exam Cardiovascular:     Rate and Rhythm: Normal rate and  regular rhythm.  Pulmonary:     Effort: Pulmonary effort is normal.     Breath sounds: Normal breath sounds.  Musculoskeletal:     Cervical back: Neck supple.  Skin:    General: Skin is warm and dry.  Neurological:     Mental Status: He is alert and oriented to person, place, and time.  Psychiatric:  Mood and Affect: Mood normal.           Assessment & Plan:  Type 2 diabetes mellitus with hyperglycemia, without long-term current use of insulin  (HCC) Assessment & Plan: Improved with A1c of 6.6 today!  Continue metformin  1000 mg BID. Foot exam today.  Follow up in 6 months.  Orders: -     POCT glycosylated hemoglobin (Hb A1C) -     Glucose Blood; Use as instructed  Dispense: 300 each; Refill: 5        Gabriel John, NP

## 2024-01-21 NOTE — Patient Instructions (Signed)
 Continue metformin  1000 mg twice daily.  Increase physical activity.  Reduce rice and bread.  Please schedule a physical to meet with me in 6 months.   It was a pleasure to see you today!

## 2024-01-21 NOTE — Assessment & Plan Note (Signed)
 Improved with A1c of 6.6 today!  Continue metformin  1000 mg BID. Foot exam today.  Follow up in 6 months.

## 2024-02-08 ENCOUNTER — Other Ambulatory Visit (HOSPITAL_COMMUNITY): Payer: Self-pay

## 2024-02-08 ENCOUNTER — Telehealth: Payer: Self-pay | Admitting: Pharmacy Technician

## 2024-02-08 DIAGNOSIS — E1165 Type 2 diabetes mellitus with hyperglycemia: Secondary | ICD-10-CM

## 2024-02-08 MED ORDER — GLUCOSE BLOOD VI STRP: ORAL_STRIP | 3 refills | Status: AC

## 2024-02-08 NOTE — Telephone Encounter (Signed)
 Pharmacy Patient Advocate Encounter   Received notification from CoverMyMeds that prior authorization for Accu-Chek Guide Test strips is required/requested.   Insurance verification completed.   The patient is insured through [facility] ADVANTAGE/RX ADVANCE .   Per test claim:  ONE TOUCH is preferred by the insurance.  If suggested medication is appropriate, Please send in a new RX and discontinue this one. If not, please advise as to why it's not appropriate so that we may request a Prior Authorization. Please note, some preferred medications may still require a PA.  If the suggested medications have not been trialed and there are no contraindications to their use, the PA will not be submitted, as it will not be approved.

## 2024-02-08 NOTE — Telephone Encounter (Signed)
 Noted.  Prescription for One Touch test strips Sent to pharmacy.

## 2024-02-13 NOTE — Progress Notes (Signed)
 Cardiology Office Note:   Date:  02/14/2024  ID:  Jason Melendez., DOB [birth year], MRN [id number] PCP: Gabriel John, NP  [facility] Providers Cardiologist:  Dorothye Gathers, MD    History of Present Illness:   Discussed the use of AI scribe software for clinical note transcription with the patient, who gave verbal consent to proceed.  History of Present Illness Jason Melendez. is a 68 y.o. male with history of left brain CVA in 2018, left carotid endarterectomy, hypertension with non-ischemic cardiomyopathy, vasovagal syncope.  He has a history of non-ischemic cardiomyopathy and hypertension, managed with Lasix  as needed. Over the past six months, he has been taking Lasix  for one week followed by two weeks off, adjusting based on his weight. He monitors his weight and resumes Lasix  when it reaches 265 pounds, which helps bring it down to 258-260 pounds. No chest pain, shortness of breath, or palpitations. He notes feeling tightness in his legs when his weight increases, which resolves with Lasix  use.  Patient with history of significant infection in his leg, requiring hospitalization and special antibiotic treatment. Due to this experience, he no longer wishes to consider SGLT2 inhibitors (had started taking just before this infection).  He has a history of a left brain cerebrovascular accident (CVA) in 2018, followed by a left carotid endarterectomy. He has not experienced any recurrent syncope episodes since an incident at a golf tournament several years ago. During that episode, he felt lightheaded and fainted, resulting in a fall. He manages symptoms by sitting down and cooling off when overheated.  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 He has recently reduced his work hours to manage his health better and enjoys spending time outdoors. He is also preparing for his daughter's upcoming  wedding.  Today patient denies chest pain, shortness of breath, lower extremity edema, fatigue, palpitations, melena, hematuria, hemoptysis, diaphoresis, weakness, presyncope, syncope, orthopnea, and PND.   Studies Reviewed:    EKG:   EKG Interpretation Date/Time:  Monday February 14 2024 09:10:56 EDT Ventricular Rate:  71 PR Interval:  166 QRS Duration:  82 QT Interval:  358 QTC Calculation: 389 R Axis:   46  Text Interpretation: Normal sinus rhythm Normal ECG When compared with ECG of 02-Mar-2023 11:30, Sinus rhythm has replaced Junctional rhythm Questionable change in QRS axis T wave inversion no longer evident in Lateral leads Confirmed by Jason Melendez [phone]) on 02/14/2024 9:26:51 AM      Risk Assessment/Calculations:              Physical Exam:   VS:  BP (!) 104/58   Pulse 71   Ht 6' (1.829 m)   Wt 265 lb (120.2 kg)   BMI 35.94 kg/m    Wt Readings from Last 3 Encounters:  02/14/24 265 lb (120.2 kg)  01/21/24 267 lb (121.1 kg)  08/03/23 269 lb 6.4 oz (122.2 kg)     Physical Exam Vitals reviewed.  Constitutional:      Appearance: Normal appearance.  HENT:     Head: Normocephalic.   Eyes:     Pupils: Pupils are equal, round, and reactive to light.    Cardiovascular:     Rate and Rhythm: Normal rate and regular rhythm.     Pulses: Normal pulses.     Heart sounds: Normal heart sounds.  Pulmonary:     Effort: Pulmonary effort is normal.  Breath sounds: Normal breath sounds.  Abdominal:     General: Abdomen is flat.     Palpations: Abdomen is soft.   Musculoskeletal:     Right lower leg: No edema.     Left lower leg: No edema.   Skin:    General: Skin is warm and dry.     Capillary Refill: Capillary refill takes less than 2 seconds.   Neurological:     General: No focal deficit present.     Mental Status: He is alert and oriented to person, place, and time.   Psychiatric:        Mood and Affect: Mood normal.        Behavior: Behavior normal.         Thought Content: Thought content normal.        Judgment: Judgment normal.     Physical Exam    ASSESSMENT AND PLAN:     Assessment and Plan Assessment & Plan Chronic combined systolic and diastolic heart failure Well-managed symptoms with LVEF of 40-45% on last echocardiogram in January 2023. Currently taking Lasix  as needed, typically one week on, two weeks off. Euvolemic today. No recurrent syncope in the last several years. Previous infection while taking Farxiga , no longer wishes to consider an SGLT2. Excellent cholesterol control with LDL of 51 on atorvastatin  40 mg. - Continue carvedilol  12.5 mg twice a day - Continue Entresto  49/51 mg twice a day - Continue spironolactone  25 mg twice a day - Continue atorvastatin  40 mg - Order repeat echocardiogram - Order metabolic panel to monitor kidney function and potassium levels  Hypertension with non-ischemic cardiomyopathy Managed with GDMT including carvedilol , Entresto , and spironolactone .  Left brain CVA Left brain CVA in 2018 with left carotid endarterectomy. No recurrent events reported. - Continue GDMT including aspirin  and cholesterol management  Left carotid endarterectomy Underwent left carotid endarterectomy in 2018.  Vasovagal syncope No recurrent syncope episodes in the last several years. Experiences heat intolerance but manages symptoms by resting and hydrating when feeling faint.             Signed, Jason Prince, PA-C

## 2024-02-14 ENCOUNTER — Ambulatory Visit: Payer: PPO | Attending: Cardiology | Admitting: Cardiology

## 2024-02-14 ENCOUNTER — Encounter: Payer: Self-pay | Admitting: Cardiology

## 2024-02-14 VITALS — BP 104/58 | HR 71 | Ht 72.0 in | Wt 265.0 lb

## 2024-02-14 DIAGNOSIS — I5042 Chronic combined systolic (congestive) and diastolic (congestive) heart failure: Secondary | ICD-10-CM

## 2024-02-14 DIAGNOSIS — Z8673 Personal history of transient ischemic attack (TIA), and cerebral infarction without residual deficits: Secondary | ICD-10-CM

## 2024-02-14 DIAGNOSIS — E782 Mixed hyperlipidemia: Secondary | ICD-10-CM

## 2024-02-14 DIAGNOSIS — I10 Essential (primary) hypertension: Secondary | ICD-10-CM | POA: Diagnosis not present

## 2024-02-14 DIAGNOSIS — Z87898 Personal history of other specified conditions: Secondary | ICD-10-CM

## 2024-02-14 NOTE — Patient Instructions (Signed)
 Medication Instructions:  No medication changes were made at this visit. Continue current regimen.   *If you need a refill on your cardiac medications before your next appointment, please call your pharmacy*  Lab Work: To be completed today: BMP  If you have labs (blood work) drawn today and your tests are completely normal, you will receive your results only by: MyChart Message (if you have MyChart) OR A paper copy in the mail If you have any lab test that is abnormal or we need to change your treatment, we will call you to review the results.  Testing/Procedures: Your physician has requested that you have an echocardiogram. Echocardiography is a painless test that uses sound waves to create images of your heart. It provides your doctor with information about the size and shape of your heart and how well your heart's chambers and valves are working. This procedure takes approximately one hour. There are no restrictions for this procedure. Please do NOT wear cologne, perfume, aftershave, or lotions (deodorant is allowed). Please arrive 15 minutes prior to your appointment time.  Please note: We ask at that you not bring children with you during ultrasound (echo/ vascular) testing. Due to room size and safety concerns, children are not allowed in the ultrasound rooms during exams. Our front office staff cannot provide observation of children in our lobby area while testing is being conducted. An adult accompanying a patient to their appointment will only be allowed in the ultrasound room at the discretion of the ultrasound technician under special circumstances. We apologize for any inconvenience.   Follow-Up: At [facility], you and your health needs are our priority.  As part of our continuing mission to provide you with exceptional heart care, our providers are all part of one team.  This team includes your primary Cardiologist (physician) and Advanced Practice Providers or APPs  (Physician Assistants and Nurse Practitioners) who all work together to provide you with the care you need, when you need it.  Your next appointment:   6 month(s)  Provider:   Dorothye Gathers, MD

## 2024-02-15 ENCOUNTER — Ambulatory Visit: Payer: Self-pay | Admitting: Cardiology

## 2024-02-15 LAB — BASIC METABOLIC PANEL WITH GFR
BUN/Creatinine Ratio: 13 (ref 10–24)
BUN: 15 mg/dL (ref 8–27)
CO2: 22 mmol/L (ref 20–29)
Calcium: 9.6 mg/dL (ref 8.6–10.2)
Chloride: 105 mmol/L (ref 96–106)
Creatinine, Ser: 1.15 mg/dL (ref 0.76–1.27)
Glucose: 119 mg/dL — ABNORMAL HIGH (ref 70–99)
Potassium: 4.9 mmol/L (ref 3.5–5.2)
Sodium: 144 mmol/L (ref 134–144)
eGFR: 70 mL/min/{1.73_m2} (ref >59)

## 2024-02-17 ENCOUNTER — Other Ambulatory Visit: Payer: Self-pay | Admitting: Primary Care

## 2024-02-17 DIAGNOSIS — E119 Type 2 diabetes mellitus without complications: Secondary | ICD-10-CM

## 2024-02-18 ENCOUNTER — Other Ambulatory Visit: Payer: Self-pay | Admitting: Primary Care

## 2024-02-18 DIAGNOSIS — F411 Generalized anxiety disorder: Secondary | ICD-10-CM

## 2024-02-18 NOTE — Telephone Encounter (Signed)
 Left voicemail for patient advising him he should have one refill left at the pharmacy and that is why this request was denied. Advised to let us  know if he is still having issues getting prescription filled once he reaches back out to pharmacy.

## 2024-02-18 NOTE — Telephone Encounter (Signed)
 Copied from CRM [id number]. Topic: Clinical - Prescription Issue >> Feb 18, 2024  9:42 AM Howard Macho wrote: Reason for CRM: patient called stating he could not get his refill on his escitalopram  (LEXAPRO ) 10 MG tablet because the pharmacy stated it was denied by the prescriber CB [phone]

## 2024-03-31 ENCOUNTER — Ambulatory Visit (HOSPITAL_COMMUNITY)
Admission: RE | Admit: 2024-03-31 | Discharge: 2024-03-31 | Disposition: A | Discharge status: Home / Self Care | Source: Ambulatory Visit | Attending: Cardiology | Admitting: Cardiology

## 2024-03-31 DIAGNOSIS — I5042 Chronic combined systolic (congestive) and diastolic (congestive) heart failure: Secondary | ICD-10-CM | POA: Insufficient documentation

## 2024-03-31 LAB — ECHOCARDIOGRAM COMPLETE
Area-P 1/2: 3.72 cm2
Est EF: 45
S' Lateral: 3.4 cm

## 2024-04-23 ENCOUNTER — Other Ambulatory Visit: Payer: Self-pay | Admitting: Primary Care

## 2024-04-23 DIAGNOSIS — F411 Generalized anxiety disorder: Secondary | ICD-10-CM

## 2024-05-05 ENCOUNTER — Telehealth: Payer: Self-pay

## 2024-05-05 DIAGNOSIS — Z23 Encounter for immunization: Secondary | ICD-10-CM

## 2024-05-05 MED ORDER — COVID-19 MRNA VACC (MODERNA) 50 MCG/0.5ML IM SUSP
0.5000 mL | Freq: Once | INTRAMUSCULAR | 0 refills | Status: AC
Start: 2024-05-05 — End: 2024-05-05

## 2024-05-05 NOTE — Telephone Encounter (Signed)
 Copied from CRM [id number]. Topic: Clinical - Medication Question >> May 05, 2024  3:11 PM Robinson H wrote: Reason for CRM: Patient needs to have a prescription for the COVID vaccine [facility].  Rory [phone]

## 2024-05-05 NOTE — Telephone Encounter (Signed)
 Vaccine Rx sent to pharmacy

## 2024-05-05 NOTE — Addendum Note (Signed)
 Addended by: Sulayman Manning K on: 05/05/2024 05:41 PM   Modules accepted: Orders

## 2024-05-16 ENCOUNTER — Other Ambulatory Visit: Payer: Self-pay | Admitting: Cardiology

## 2024-05-16 ENCOUNTER — Other Ambulatory Visit: Payer: Self-pay | Admitting: Primary Care

## 2024-05-16 DIAGNOSIS — E7849 Other hyperlipidemia: Secondary | ICD-10-CM

## 2024-05-16 DIAGNOSIS — E782 Mixed hyperlipidemia: Secondary | ICD-10-CM

## 2024-05-18 ENCOUNTER — Telehealth: Payer: Self-pay | Admitting: Cardiology

## 2024-05-18 DIAGNOSIS — E782 Mixed hyperlipidemia: Secondary | ICD-10-CM

## 2024-05-18 MED ORDER — CARVEDILOL 12.5 MG PO TABS: 12.5000 mg | ORAL_TABLET | Freq: Two times a day (BID) | ORAL | 2 refills | Status: AC

## 2024-05-18 NOTE — Telephone Encounter (Signed)
 Pt's medication was sent to pt's pharmacy as requested. Confirmation received.

## 2024-05-18 NOTE — Telephone Encounter (Signed)
*  STAT* If patient is at the pharmacy, call can be transferred to refill team.   1. Which medications need to be refilled? (please list name of each medication and dose if known)   carvedilol  (COREG ) 12.5 MG tablet   2. Would you like to learn more about the convenience, safety, & potential cost savings by using the [facility]?   3. Are you open to using the [facility] (Type [facility]. ).  4. Which pharmacy/location (including street and city if local pharmacy) is medication to be sent to?  [id number] [place], [place] - [address]   5. Do they need a 30 day or 90 day supply?   90 day  Patient stated he still has some medication.

## 2024-07-16 ENCOUNTER — Other Ambulatory Visit: Payer: Self-pay | Admitting: Primary Care

## 2024-07-16 DIAGNOSIS — F411 Generalized anxiety disorder: Secondary | ICD-10-CM

## 2024-08-04 ENCOUNTER — Encounter: Payer: Self-pay | Admitting: Primary Care

## 2024-08-04 ENCOUNTER — Ambulatory Visit: Payer: Self-pay | Admitting: Primary Care

## 2024-08-04 ENCOUNTER — Ambulatory Visit: Admitting: Primary Care

## 2024-08-04 VITALS — BP 120/60 | HR 96 | Temp 98.3°F | Ht 72.0 in | Wt 266.0 lb

## 2024-08-04 DIAGNOSIS — I10 Essential (primary) hypertension: Secondary | ICD-10-CM | POA: Diagnosis not present

## 2024-08-04 DIAGNOSIS — E1165 Type 2 diabetes mellitus with hyperglycemia: Secondary | ICD-10-CM

## 2024-08-04 DIAGNOSIS — Z125 Encounter for screening for malignant neoplasm of prostate: Secondary | ICD-10-CM

## 2024-08-04 DIAGNOSIS — Z0000 Encounter for general adult medical examination without abnormal findings: Secondary | ICD-10-CM | POA: Diagnosis not present

## 2024-08-04 DIAGNOSIS — E7849 Other hyperlipidemia: Secondary | ICD-10-CM | POA: Diagnosis not present

## 2024-08-04 DIAGNOSIS — I5042 Chronic combined systolic (congestive) and diastolic (congestive) heart failure: Secondary | ICD-10-CM

## 2024-08-04 DIAGNOSIS — F411 Generalized anxiety disorder: Secondary | ICD-10-CM

## 2024-08-04 LAB — LIPID PANEL
Cholesterol: 103 mg/dL (ref 0–200)
HDL: 24.7 mg/dL — ABNORMAL LOW (ref >39.00)
LDL Cholesterol: 43 mg/dL (ref 0–99)
NonHDL: 78.76
Total CHOL/HDL Ratio: 4
Triglycerides: 180 mg/dL — ABNORMAL HIGH (ref 0.0–149.0)
VLDL: 36 mg/dL (ref 0.0–40.0)

## 2024-08-04 LAB — COMPREHENSIVE METABOLIC PANEL WITH GFR
ALT: 11 U/L (ref 0–53)
AST: 16 U/L (ref 0–37)
Albumin: 4.3 g/dL (ref 3.5–5.2)
Alkaline Phosphatase: 30 U/L — ABNORMAL LOW (ref 39–117)
BUN: 20 mg/dL (ref 6–23)
CO2: 29 meq/L (ref 19–32)
Calcium: 9.4 mg/dL (ref 8.4–10.5)
Chloride: 104 meq/L (ref 96–112)
Creatinine, Ser: 1.05 mg/dL (ref 0.40–1.50)
GFR: 73.22 mL/min (ref >60.00)
Glucose, Bld: 161 mg/dL — ABNORMAL HIGH (ref 70–99)
Potassium: 4.5 meq/L (ref 3.5–5.1)
Sodium: 142 meq/L (ref 135–145)
Total Bilirubin: 0.8 mg/dL (ref 0.2–1.2)
Total Protein: 6.5 g/dL (ref 6.0–8.3)

## 2024-08-04 LAB — HEMOGLOBIN A1C: Hgb A1c MFr Bld: 6.8 % — ABNORMAL HIGH (ref 4.6–6.5)

## 2024-08-04 LAB — MICROALBUMIN / CREATININE URINE RATIO
Creatinine,U: 97.7 mg/dL
Microalb Creat Ratio: 8.3 mg/g (ref 0.0–30.0)
Microalb, Ur: 0.8 mg/dL (ref 0.0–1.9)

## 2024-08-04 LAB — PSA, MEDICARE: PSA: 1.15 ng/mL (ref 0.10–4.00)

## 2024-08-04 NOTE — Assessment & Plan Note (Signed)
 Repeat A1C pending.   Continue metformin 1000 mg BID.  Urine microalbumin pending.   Follow up in 6 months.

## 2024-08-04 NOTE — Assessment & Plan Note (Signed)
 Controlled.  Continue carvedilol  12.5 mg BID, spironolactone  25 mg daily, Entresto  49-51 mg daily. CMP pending.

## 2024-08-04 NOTE — Assessment & Plan Note (Signed)
Controlled.  Continue Lexapro 10 mg daily. 

## 2024-08-04 NOTE — Progress Notes (Signed)
 Subjective:    Patient ID: Jason Melendez., male    DOB: [birth year], 68 y.o.   MRN: [id number]  Jason Melendez. is a very pleasant 68 y.o. male who presents today for complete physical and follow up of chronic conditions.  Immunizations: -Tetanus: Completed in 2016 -Influenza: Completed this season   -Shingles: Completed Shingrix series -Pneumonia: Completed 2023  Diet: Fair diet.  Exercise: No regular exercise.  Eye exam: Completes annually  Dental exam: Completes semi-annually    Colonoscopy: Completed Cologuard in 2023, negative. Due 2026.  PSA: Due   BP Readings from Last 3 Encounters:  08/04/24 120/60  02/14/24 (!) 104/58  01/21/24 124/66        Review of Systems  Constitutional:  Negative for unexpected weight change.  HENT:  Negative for rhinorrhea.   Respiratory:  Negative for cough and shortness of breath.   Cardiovascular:  Negative for chest pain.  Gastrointestinal:  Negative for constipation and diarrhea.  Genitourinary:  Negative for difficulty urinating.  Musculoskeletal:  Negative for arthralgias and myalgias.  Skin:  Negative for rash.  Allergic/Immunologic: Negative for environmental allergies.  Neurological:  Negative for dizziness and headaches.  Psychiatric/Behavioral:  The patient is not nervous/anxious.          Past Medical History:  Diagnosis Date   Allergy Feb 2018   Hydozalin   Cardiopulmonary arrest [facility])    Cellulitis of right lower extremity 03/13/2022   Diabetes mellitus without complication (HCC)    History of CVA (cerebrovascular accident) 10/13/2016   HTN (hypertension)    Hyperlipidemia    Neurocardiogenic syncope 11/12/2016   Onychomycosis 04/07/2022   Sepsis (HCC) 03/14/2022   Sepsis due to undetermined organism (HCC) 03/13/2022   Stroke (HCC) Feb 2018   Syncope 03/30/2018   Tinea pedis of right foot 03/13/2022   Urolithiasis 03/13/2022    Social History   Socioeconomic History   Marital  status: Married    Spouse name: Not on file   Number of children: Not on file   Years of education: Not on file   Highest education level: Associate degree: occupational, scientist, product/process development, or vocational program  Occupational History   Not on file  Tobacco Use   Smoking status: Never   Smokeless tobacco: Never  Vaping Use   Vaping status: Never Used  Substance and Sexual Activity   Alcohol use: Never   Drug use: Not Currently   Sexual activity: Not Currently    Comment: Doctor botched hysterectomy and wife said no more sex  Other Topics Concern   Not on file  Social History Narrative   Marital status: married      Children: 2 daughters      Lives:  With wife      Employment: printing work      Tobacco: none      Alcohol: one beer every three months      Drugs:  None      Exercise:  No formal exercise; walks up three flights of stairs several times per day at work.   Lives at home.  Works for [employer].     2 yrs college.     Social Drivers of Corporate Investment Banker Strain: Low Risk  (08/03/2024)   Overall Financial Resource Strain (CARDIA)    Difficulty of Paying Living Expenses: Not very hard  Food Insecurity: No Food Insecurity (08/03/2024)   Hunger Vital Sign    Worried About Running Out of Food in  the Last Year: Never true    Ran Out of Food in the Last Year: Never true  Transportation Needs: No Transportation Needs (08/03/2024)   PRAPARE - Administrator, Civil Service (Medical): No    Lack of Transportation (Non-Medical): No  Physical Activity: Inactive (08/03/2024)   Exercise Vital Sign    Days of Exercise per Week: 0 days    Minutes of Exercise per Session: Not on file  Stress: No Stress Concern Present (08/03/2024)   [employer] of Occupational Health - Occupational Stress Questionnaire    Feeling of Stress: Only a little  Social Connections: Moderately Isolated (08/03/2024)   Social Connection and Isolation Panel    Frequency of  Communication with Friends and Family: Once a week    Frequency of Social Gatherings with Friends and Family: Once a week    Attends Religious Services: 1 to 4 times per year    Active Member of [employer] or Organizations: No    Attends Engineer, Structural: Not on file    Marital Status: Married  Catering Manager Violence: Not on file    Past Surgical History:  Procedure Laterality Date   ENDARTERECTOMY Left 10/16/2016   Procedure: ENDARTERECTOMY CAROTID;  Surgeon: Gaile LELON New, MD;  [facility];  Service: Vascular;  Laterality: Left;   HERNIA REPAIR  Birth   PATCH ANGIOPLASTY Left 10/16/2016   Procedure: PATCH ANGIOPLASTY USING GEORGE BIOLOGIC PATCH;  Surgeon: Gaile LELON New, MD;  Location: [facility] OR;  Service: Vascular;  Laterality: Left;    Family History  Problem Relation Age of Onset   Diabetes Mother    Miscarriages / Stillbirths Mother    Heart disease Father 87       CABG   Diabetes Father    Kidney disease Father    Kidney failure Father    Diabetes Sister    Hypertension Sister     Allergies  Allergen Reactions   Hydralazine  Anaphylaxis and Other (See Comments)    THE PATIENT CODED BECAUSE OF A SUDDEN DROP IN B/P!!!! HAD TO RECEIVE CPR!!   Farxiga  [Dapagliflozin ] Other (See Comments)    Current Outpatient Medications on File Prior to Visit  Medication Sig Dispense Refill   ACCU-CHEK FASTCLIX LANCETS MISC USE TO CHECK BLOOD SUGARS ONCE DAILY AS DIRECTED 102 each 5   acetaminophen  (TYLENOL ) 650 MG CR tablet Take 650 mg by mouth 2 (two) times daily.     aspirin  325 MG tablet Take 1 tablet (325 mg total) by mouth daily. 60 tablet 0   atorvastatin  (LIPITOR) 40 MG tablet TAKE 1 TABLET BY MOUTH EVERY DAY FOR CHOLESTEROL 90 tablet 0   carvedilol  (COREG ) 12.5 MG tablet Take 1 tablet (12.5 mg total) by mouth in the morning and at bedtime. 180 tablet 2   escitalopram  (LEXAPRO ) 10 MG tablet TAKE 1 TABLET (10 MG TOTAL) BY MOUTH DAILY. FOR ANXIETY. 90 tablet 0    furosemide  (LASIX ) 20 MG tablet TAKE 1 TABLET BY MOUTH EVERY DAY 90 tablet 3   glucose blood test strip Use to check blood sugar 1-3 times daily 300 each 3   metFORMIN  (GLUCOPHAGE ) 1000 MG tablet TAKE 1 TABLET (1,000 MG TOTAL) BY MOUTH 2 (TWO) TIMES DAILY WITH A MEAL. FOR DIABETES. 180 tablet 1   sacubitril -valsartan  (ENTRESTO ) 49-51 MG Take 1 tablet by mouth 2 (two) times daily. 180 tablet 3   spironolactone  (ALDACTONE ) 25 MG tablet TAKE 1 TABLET (25 MG TOTAL) BY MOUTH DAILY. 90 tablet 3  No current facility-administered medications on file prior to visit.    BP 120/60   Pulse 96   Temp 98.3 F (36.8 C) (Oral)   Ht 6' (1.829 m)   Wt 266 lb (120.7 kg)   SpO2 96%   BMI 36.08 kg/m  Objective:   Physical Exam HENT:     Right Ear: Tympanic membrane and ear canal normal.     Left Ear: Tympanic membrane and ear canal normal.  Eyes:     Pupils: Pupils are equal, round, and reactive to light.  Cardiovascular:     Rate and Rhythm: Normal rate and regular rhythm.  Pulmonary:     Effort: Pulmonary effort is normal.     Breath sounds: Normal breath sounds.  Abdominal:     General: Bowel sounds are normal.     Palpations: Abdomen is soft.     Tenderness: There is no abdominal tenderness.  Musculoskeletal:        General: Normal range of motion.     Cervical back: Neck supple.  Skin:    General: Skin is warm and dry.  Neurological:     Mental Status: He is alert and oriented to person, place, and time.     Cranial Nerves: No cranial nerve deficit.     Deep Tendon Reflexes:     Reflex Scores:      Patellar reflexes are 2+ on the right side and 2+ on the left side. Psychiatric:        Mood and Affect: Mood normal.     Physical Exam        Assessment & Plan:  Preventative health care Assessment & Plan: Immunizations UTD. Colon cancer screening due in 2026. PSA due and pending.  Discussed the importance of a healthy diet and regular exercise in order for weight loss, and  to reduce the risk of further co-morbidity.  Exam stable. Labs pending.  Follow up in 1 year for repeat physical.    Chronic combined systolic and diastolic heart failure (HCC) Assessment & Plan: Controlled. Appears euvolemic today.  Following with cardiology, office notes reviewed from June 2025. Reviewed echocardiogram from August 2025.  Continue carvedilol  12.5 mg BID, furosemide  20 mg daily PRN, spironolactone  25 mg daily, Entresto  49-51  mg daily.    Primary hypertension Assessment & Plan: Controlled.  Continue carvedilol  12.5 mg BID, spironolactone  25 mg daily, Entresto  49-51 mg daily. CMP pending.  Orders: -     Comprehensive metabolic panel with GFR  Type 2 diabetes mellitus with hyperglycemia, without long-term current use of insulin  (HCC) Assessment & Plan: Repeat A1C pending.  Continue metformin  1000 mg BID. Urine microalbumin pending.  Follow up in 6 months.  Orders: -     Microalbumin / creatinine urine ratio -     Hemoglobin A1c  Other hyperlipidemia Assessment & Plan: Repeat lipid panel pending.  Continue atorvastatin  40 mg daily.  Orders: -     Lipid panel  GAD (generalized anxiety disorder) Assessment & Plan: Controlled.  Continue Lexapro  10 mg daily.   Screening for prostate cancer -     PSA, Medicare    Assessment and Plan Assessment & Plan         Comer MARLA Gaskins, NP

## 2024-08-04 NOTE — Assessment & Plan Note (Signed)
 Repeat lipid panel pending. Continue atorvastatin 40 mg daily.

## 2024-08-04 NOTE — Assessment & Plan Note (Signed)
 Immunizations UTD. Colon cancer screening due in 2026. PSA due and pending.  Discussed the importance of a healthy diet and regular exercise in order for weight loss, and to reduce the risk of further co-morbidity.  Exam stable. Labs pending.  Follow up in 1 year for repeat physical.

## 2024-08-04 NOTE — Patient Instructions (Signed)
 Stop by the lab prior to leaving today. I will notify you of your results once received.   Please schedule a follow up visit for 6 months for a diabetes check.  It was a pleasure to see you today!

## 2024-08-04 NOTE — Assessment & Plan Note (Signed)
 Controlled. Appears euvolemic today.  Following with cardiology, office notes reviewed from June 2025. Reviewed echocardiogram from August 2025.  Continue carvedilol  12.5 mg BID, furosemide  20 mg daily PRN, spironolactone  25 mg daily, Entresto  49-51  mg daily.

## 2024-08-12 ENCOUNTER — Other Ambulatory Visit: Payer: Self-pay | Admitting: Cardiology

## 2024-08-12 ENCOUNTER — Other Ambulatory Visit: Payer: Self-pay | Admitting: Primary Care

## 2024-08-12 ENCOUNTER — Other Ambulatory Visit: Payer: Self-pay | Admitting: Physician Assistant

## 2024-08-12 DIAGNOSIS — I5042 Chronic combined systolic (congestive) and diastolic (congestive) heart failure: Secondary | ICD-10-CM

## 2024-08-12 DIAGNOSIS — E782 Mixed hyperlipidemia: Secondary | ICD-10-CM

## 2024-08-12 DIAGNOSIS — E7849 Other hyperlipidemia: Secondary | ICD-10-CM

## 2024-08-12 DIAGNOSIS — E119 Type 2 diabetes mellitus without complications: Secondary | ICD-10-CM

## 2025-02-02 ENCOUNTER — Ambulatory Visit: Admitting: Primary Care
